# Patient Record
Sex: Female | Born: 1945 | Race: White | Hispanic: No | Smoking: Former smoker
Health system: Southern US, Community
[De-identification: ages and names within clinical notes are randomized; demographics above are authoritative.]

## PROBLEM LIST (undated history)

## (undated) DIAGNOSIS — G610 Guillain-Barre syndrome: Secondary | ICD-10-CM

## (undated) DIAGNOSIS — C4492 Squamous cell carcinoma of skin, unspecified: Secondary | ICD-10-CM

## (undated) DIAGNOSIS — F419 Anxiety disorder, unspecified: Secondary | ICD-10-CM

## (undated) DIAGNOSIS — F329 Major depressive disorder, single episode, unspecified: Secondary | ICD-10-CM

## (undated) DIAGNOSIS — F32A Depression, unspecified: Secondary | ICD-10-CM

## (undated) DIAGNOSIS — M810 Age-related osteoporosis without current pathological fracture: Secondary | ICD-10-CM

## (undated) HISTORY — DX: Depression, unspecified: F32.A

## (undated) HISTORY — DX: Anxiety disorder, unspecified: F41.9

## (undated) HISTORY — DX: Guillain-Barre syndrome: G61.0

## (undated) HISTORY — DX: Age-related osteoporosis without current pathological fracture: M81.0

## (undated) HISTORY — DX: Major depressive disorder, single episode, unspecified: F32.9

## (undated) HISTORY — DX: Squamous cell carcinoma of skin, unspecified: C44.92

---
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
## 1978-06-14 HISTORY — PX: BREAST SURGERY: SHX581

## 1978-06-14 HISTORY — PX: BREAST EXCISIONAL BIOPSY: SUR124

## 1987-06-15 HISTORY — PX: ABDOMINAL HYSTERECTOMY: SHX81

## 2003-06-15 DIAGNOSIS — G610 Guillain-Barre syndrome: Secondary | ICD-10-CM

## 2003-06-15 HISTORY — DX: Guillain-Barre syndrome: G61.0

## 2004-03-14 ENCOUNTER — Encounter: Payer: Self-pay | Admitting: Neurology

## 2004-04-14 ENCOUNTER — Encounter: Payer: Self-pay | Admitting: Neurology

## 2004-05-14 ENCOUNTER — Encounter: Payer: Self-pay | Admitting: Neurology

## 2004-06-14 ENCOUNTER — Encounter: Payer: Self-pay | Admitting: Neurology

## 2005-09-06 ENCOUNTER — Inpatient Hospital Stay (HOSPITAL_COMMUNITY): Admission: EM | Admit: 2005-09-06 | Discharge: 2005-09-10 | Payer: Self-pay | Admitting: Psychiatry

## 2005-09-07 ENCOUNTER — Ambulatory Visit: Payer: Self-pay | Admitting: Psychiatry

## 2008-07-12 ENCOUNTER — Emergency Department: Payer: Self-pay | Admitting: Emergency Medicine

## 2008-07-22 ENCOUNTER — Encounter: Payer: Self-pay | Admitting: General Practice

## 2008-07-23 ENCOUNTER — Encounter: Payer: Self-pay | Admitting: Family Medicine

## 2008-07-29 ENCOUNTER — Ambulatory Visit: Payer: Self-pay | Admitting: Family Medicine

## 2008-07-29 DIAGNOSIS — F32A Depression, unspecified: Secondary | ICD-10-CM | POA: Insufficient documentation

## 2008-07-29 DIAGNOSIS — F331 Major depressive disorder, recurrent, moderate: Secondary | ICD-10-CM | POA: Insufficient documentation

## 2008-07-29 DIAGNOSIS — R5383 Other fatigue: Secondary | ICD-10-CM | POA: Insufficient documentation

## 2008-07-29 DIAGNOSIS — R634 Abnormal weight loss: Secondary | ICD-10-CM | POA: Insufficient documentation

## 2008-07-29 DIAGNOSIS — F329 Major depressive disorder, single episode, unspecified: Secondary | ICD-10-CM

## 2008-08-01 ENCOUNTER — Ambulatory Visit: Payer: Self-pay | Admitting: Family Medicine

## 2008-08-01 ENCOUNTER — Encounter: Payer: Self-pay | Admitting: Family Medicine

## 2008-08-08 ENCOUNTER — Ambulatory Visit: Payer: Self-pay | Admitting: Family Medicine

## 2008-08-12 ENCOUNTER — Encounter (INDEPENDENT_AMBULATORY_CARE_PROVIDER_SITE_OTHER): Payer: Self-pay | Admitting: *Deleted

## 2008-08-15 ENCOUNTER — Encounter (INDEPENDENT_AMBULATORY_CARE_PROVIDER_SITE_OTHER): Payer: Self-pay | Admitting: *Deleted

## 2008-08-15 ENCOUNTER — Telehealth: Payer: Self-pay | Admitting: Family Medicine

## 2008-08-26 ENCOUNTER — Ambulatory Visit: Payer: Self-pay | Admitting: Family Medicine

## 2008-08-26 DIAGNOSIS — D649 Anemia, unspecified: Secondary | ICD-10-CM | POA: Insufficient documentation

## 2008-08-26 DIAGNOSIS — E559 Vitamin D deficiency, unspecified: Secondary | ICD-10-CM | POA: Insufficient documentation

## 2008-09-03 ENCOUNTER — Ambulatory Visit: Payer: Self-pay | Admitting: Family Medicine

## 2008-09-03 ENCOUNTER — Encounter: Payer: Self-pay | Admitting: Family Medicine

## 2009-06-14 DIAGNOSIS — C4492 Squamous cell carcinoma of skin, unspecified: Secondary | ICD-10-CM

## 2009-06-14 HISTORY — DX: Squamous cell carcinoma of skin, unspecified: C44.92

## 2010-07-12 LAB — CONVERTED CEMR LAB: Fecal Occult Bld: NEGATIVE

## 2010-10-30 NOTE — Discharge Summary (Signed)
[name], [name]                ACCOUNT NO.:  [id number]   MEDICAL RECORD NO.:  [id number]          PATIENT TYPE:  IPS   LOCATION:  0307                          FACILITY:  [facility]   PHYSICIAN:  [name], M.D. DATE OF BIRTH:  [birth year]   DATE OF ADMISSION:  09/06/2005  DATE OF DISCHARGE:  09/10/2005                                 DISCHARGE SUMMARY   IDENTIFYING DATA:  This is a 65 year old widowed Caucasian female  voluntarily admitted with a history of depression, fleeting suicidal passive  thoughts of how to hurt herself, which meant she just would die and felt  that she was a coward since she did not have the guts to hurt herself.  Hospitalized last year.  Got tearful, isolative, not sleeping well, wakes up  at 4 a.m.  No psychotic symptoms.   PAST PSYCHIATRIC HISTORY:  First [facility] admission.  Sees a  therapist.  In the past had been on Celexa.   PRIMARY CARE PHYSICIAN:  Dr. Achille Rich in [place].   MEDICATIONS:  Xanax 0.25 mg b.i.d. one year ago.   ALLERGIES:  KEFLEX (causing hives).   PHYSICAL EXAMINATION:  Physical and neurologic exam within normal limits.   MENTAL STATUS EXAM:  Fully alert, cooperative.  Fair eye contact.  Casually  dressed.  Speech soft-spoken.  Mood depressed.  Affect restricted.  Thought  processes goal directed.  No evidence of psychosis.  Cognitively intact.  Judgment and insight were impaired.   ADMISSION DIAGNOSES:  AXIS I:  Major depressive disorder, single episode,  severe.  AXIS II:  Deferred.  AXIS III:  History of Guillain-Barre syndrome.  AXIS IV:  Moderate (problems with multiple psychosocial issues).  AXIS V:  30/55-60.   HOSPITAL COURSE:  The patient was admitted and ordered routine p.r.n.  medications and underwent further monitoring.  Was encouraged to participate  in individual, group and milieu therapy.  The patient was stabilized.  Risk/benefit ratio and alternative treatments were discussed  regarding  medications and patient was agreeable with starting with Lamictal.  Aware of  risk of rash and other precautions.  The patient reported tolerating  medications well.  Family was ordered.  Aftercare planning in place.  Support system mobilized and patient reported a positive response from  crisis intervention, supportive therapy, stabilization.   CONDITION ON DISCHARGE:  At the time of discharge, mood was euthymic.  Affect brighter.  Judgment and insight improved.  Healthier coping skills.  Reported motivation to be compliant with the aftercare plan.  The patient  was discharged after medication education again was reviewed at the time of  discharge.   DISCHARGE MEDICATIONS:  1.  Ambien 12.5 mg at 9 p.m.  2.  Lamictal 25 mg at 9 p.m. until September 19, 2005 and then 50 mg at 9 p.m.  3.  Xanax 0.5 mg, 1/2-1 every eight hours p.r.n. anxiety.   FOLLOW UP:  The patient was to follow up with __________ Beverely Pace at [facility] on September 14, 2005 and on September 16, 2005 and Dr. Donell Beers at [facility] on September 15, 2005.   DISCHARGE DIAGNOSES:  AXIS I:  Major depressive disorder, single episode,  severe.  AXIS II:  Deferred.  AXIS III:  History of Guillain-Barre syndrome.  AXIS IV:  Moderate (problems with multiple psychosocial issues).  AXIS V:  GAF on discharge 60.      [name], M.D.  Electronically Signed     JEM/MEDQ  D:  09/16/2005  T:  09/18/2005  Job:  [id number]

## 2010-10-30 NOTE — H&P (Signed)
[name], [name]                ACCOUNT NO.:  [id number]   MEDICAL RECORD NO.:  [id number]          PATIENT TYPE:  IPS   LOCATION:  0307                          FACILITY:  [facility]   PHYSICIAN:  [name], M.D. DATE OF BIRTH:  1945-12-22   DATE OF ADMISSION:  09/06/2005  DATE OF DISCHARGE:                         PSYCHIATRIC ADMISSION ASSESSMENT   IDENTIFYING INFORMATION:  This is a 65 year old widowed white female  voluntarily admitted on September 06, 2005.   HISTORY OF PRESENT ILLNESS:  The patient presents with a history of  depressive symptoms, fleeting suicidal thoughts, having thoughts hoping to  get some sort of terminal illness.  The patient states that she is a  coward and would not actively harm herself.  Lost her husband about a year  ago.  Has been tearful and isolating.  She has not been sleeping well.  She  wakes up about 4:00 in the morning.  Denies any psychotic symptoms and sees  no reason to live.   PAST PSYCHIATRIC HISTORY:  First admission to [facility].  She  is seeing a therapist.  In the past has been on Celexa.   SOCIAL HISTORY:  This is a 65 year old widowed white female, married for 32  years.  She has an adult daughter.  She lives alone.  She works as a Audiological scientist at [employer].   FAMILY HISTORY:  Denies.   ALCOHOL/DRUG HISTORY:  Nonsmoker.  Denies any alcohol or drug use.   PRIMARY CARE PHYSICIAN:  Dr. Orson Aloe in [place].   MEDICAL PROBLEMS:  History of Guillain-Barre syndrome two years ago which  she had residual weakness.   MEDICATIONS:  Has been on Xanax 0.25 mg b.i.d. for one year.   ALLERGIES:  KEFLEX (she reports problems with hives).   REVIEW OF SYSTEMS:  She denies any fever, chills, no chest pain, no  shortness of breath, no nausea, vomiting or diarrhea.  No seizures.  Positive for insomnia.  No weight loss.  Some lower extremity weakness.  Positive for depression.  No substance abuse.   PHYSICAL EXAMINATION:  VITAL SIGNS:  Temperature 98.4, heart rate 67,  respirations 14, blood pressure 147/88, height 5 feet 5 inches tall, weight  178 pounds.  GENERAL:  This is a well-nourished, middle-aged female.  NECK:  Negative lymphadenopathy.  Trachea is midline.  CHEST:  Clear.  BREAST:  Exam is deferred.  HEART:  Regular rate and rhythm.  ABDOMEN:  Soft, nontender.  GU:  Exam deferred.  EXTREMITIES:  The patient moves all extremities.  No clubbing, no  deformities.  SKIN:  Warm and dry without rashes or lacerations noted.  NEUROLOGIC:  Findings are intact.  Nonfocal.   LABORATORY DATA:  CBC is within normal limits.  Urinalysis shows moderate  bacteria.  Glucose is 114.  TSH is 3.10.   DIAGNOSES:  AXIS I:  Major depressive disorder, single, severe.  AXIS II:  Deferred.  AXIS III:  History of Guillain-Barre.  AXIS IV:  Other psychosocial problems related to grief.  AXIS V:  Current 30.   PLAN:  To stabilize mood  and thinking.  Will initiate Lamictal.  Risks and  benefits were discussed.  The patient is agreeable to beginning medication.  The patient is to attend all groups.  The patient is to continue with her  therapy.  The patient may receive some benefit from hospice therapy.   TENTATIVE LENGTH OF STAY:  Four to six days.      Landry Corporal, N.P.      [name], M.D.  Electronically Signed    JO/MEDQ  D:  09/10/2005  T:  09/11/2005  Job:  [id number]

## 2010-12-04 ENCOUNTER — Other Ambulatory Visit: Payer: Self-pay | Admitting: Physician Assistant

## 2011-02-26 ENCOUNTER — Other Ambulatory Visit: Payer: Self-pay | Admitting: Specialist

## 2011-04-14 ENCOUNTER — Ambulatory Visit: Payer: Self-pay | Admitting: Urology

## 2011-05-09 LAB — HM MAMMOGRAPHY: HM Mammogram: NORMAL

## 2011-05-12 ENCOUNTER — Ambulatory Visit: Payer: Self-pay | Admitting: Specialist

## 2012-08-09 ENCOUNTER — Ambulatory Visit: Payer: Self-pay | Admitting: Internal Medicine

## 2012-11-22 ENCOUNTER — Ambulatory Visit: Payer: Self-pay | Admitting: Internal Medicine

## 2012-12-24 ENCOUNTER — Emergency Department: Payer: Self-pay | Admitting: Unknown Physician Specialty

## 2012-12-24 ENCOUNTER — Ambulatory Visit: Payer: Self-pay | Admitting: Orthopedic Surgery

## 2012-12-24 LAB — COMPREHENSIVE METABOLIC PANEL
Albumin: 3.7 g/dL (ref 3.4–5.0)
BUN: 14 mg/dL (ref 7–18)
Calcium, Total: 8.7 mg/dL (ref 8.5–10.1)
Chloride: 108 mmol/L — ABNORMAL HIGH (ref 98–107)
EGFR (African American): >60
EGFR (Non-African Amer.): >60
Glucose: 93 mg/dL (ref 65–99)
Osmolality: 283 (ref 275–301)
Potassium: 4 mmol/L (ref 3.5–5.1)
SGOT(AST): 17 U/L (ref 15–37)
SGPT (ALT): 18 U/L (ref 12–78)
Sodium: 142 mmol/L (ref 136–145)

## 2012-12-24 LAB — CBC
HCT: 36 % (ref 35.0–47.0)
MCHC: 34.8 g/dL (ref 32.0–36.0)
MCV: 90 fL (ref 80–100)
Platelet: 228 10*3/uL (ref 150–440)
RDW: 13.1 % (ref 11.5–14.5)
WBC: 4.1 10*3/uL (ref 3.6–11.0)

## 2012-12-24 LAB — APTT: Activated PTT: 26.2 secs (ref 23.6–35.9)

## 2012-12-24 LAB — PROTIME-INR: INR: 0.9

## 2013-01-23 ENCOUNTER — Encounter: Payer: Self-pay | Admitting: Internal Medicine

## 2013-01-23 ENCOUNTER — Ambulatory Visit (INDEPENDENT_AMBULATORY_CARE_PROVIDER_SITE_OTHER): Payer: 59 | Admitting: Internal Medicine

## 2013-01-23 VITALS — BP 130/64 | HR 68 | Temp 98.0°F | Resp 14 | Ht 66.0 in | Wt 170.8 lb

## 2013-01-23 DIAGNOSIS — F330 Major depressive disorder, recurrent, mild: Secondary | ICD-10-CM

## 2013-01-23 DIAGNOSIS — S5290XA Unspecified fracture of unspecified forearm, initial encounter for closed fracture: Secondary | ICD-10-CM

## 2013-01-23 DIAGNOSIS — R29898 Other symptoms and signs involving the musculoskeletal system: Secondary | ICD-10-CM

## 2013-01-23 DIAGNOSIS — E785 Hyperlipidemia, unspecified: Secondary | ICD-10-CM

## 2013-01-23 DIAGNOSIS — Z8669 Personal history of other diseases of the nervous system and sense organs: Secondary | ICD-10-CM

## 2013-01-23 DIAGNOSIS — M810 Age-related osteoporosis without current pathological fracture: Secondary | ICD-10-CM

## 2013-01-23 DIAGNOSIS — E559 Vitamin D deficiency, unspecified: Secondary | ICD-10-CM

## 2013-01-23 DIAGNOSIS — S42309S Unspecified fracture of shaft of humerus, unspecified arm, sequela: Secondary | ICD-10-CM

## 2013-01-23 DIAGNOSIS — S5291XS Unspecified fracture of right forearm, sequela: Secondary | ICD-10-CM

## 2013-01-23 DIAGNOSIS — R5381 Other malaise: Secondary | ICD-10-CM

## 2013-01-23 HISTORY — DX: Unspecified fracture of unspecified forearm, initial encounter for closed fracture: S52.90XA

## 2013-01-23 LAB — CBC WITH DIFFERENTIAL/PLATELET
Basophils Absolute: 0 10*3/uL (ref 0.0–0.1)
Basophils Relative: 0.5 % (ref 0.0–3.0)
Eosinophils Absolute: 0.8 10*3/uL — ABNORMAL HIGH (ref 0.0–0.7)
MCHC: 30.4 g/dL (ref 30.0–36.0)
MCV: 94.3 fl (ref 78.0–100.0)
Monocytes Absolute: 0.4 10*3/uL (ref 0.1–1.0)
Neutrophils Relative %: 41.4 % — ABNORMAL LOW (ref 43.0–77.0)
Platelets: 198 10*3/uL (ref 150.0–400.0)
RBC: 4.49 Mil/uL (ref 3.87–5.11)
RDW: 13 % (ref 11.5–14.6)

## 2013-01-23 LAB — COMPREHENSIVE METABOLIC PANEL
ALT: 11 U/L (ref 0–35)
AST: 15 U/L (ref 0–37)
Albumin: 4.3 g/dL (ref 3.5–5.2)
Alkaline Phosphatase: 68 U/L (ref 39–117)
Chloride: 105 mEq/L (ref 96–112)
Potassium: 4.4 mEq/L (ref 3.5–5.1)
Sodium: 141 mEq/L (ref 135–145)
Total Protein: 7.3 g/dL (ref 6.0–8.3)

## 2013-01-23 LAB — LIPID PANEL
HDL: 61.1 mg/dL (ref >39.00)
Total CHOL/HDL Ratio: 3
VLDL: 13.4 mg/dL (ref 0.0–40.0)

## 2013-01-23 NOTE — Progress Notes (Signed)
Patient ID: Holly [name], female   DOB: 1946-02-10, 67 y.o.   MRN: [id number]   Patient Active Problem List   Diagnosis Date Noted  . Right leg weakness 01/23/2013  . History of Guillain-Barre syndrome 01/23/2013  . Radial fracture 01/23/2013  . 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 UNSPECIFIED VITAMIN D DEFICIENCY 08/26/2008  . UNSPECIFIED ANEMIA 08/26/2008  . Recurrent depressive disorder, current episode mild 07/29/2008  . FATIGUE 07/29/2008  . WEIGHT LOSS 07/29/2008    Subjective:  CC:   Chief Complaint  Patient presents with  . Establish Care    Previous patient from dr.blockard office: Concern of hand tremors.    HPI:   Holly [name] is a 67 y.o. female who presents as a new patient to establish primary care with the chief complaint of  Right arm pain.  She suffered a distal radial fracture on July 13th which was reduced in the ER and casted by Patsy Lager. J Occurred when she fell in the yard while trying to remove a broken tree branch. Currently using percocet for pain control.  Using Kefir milk and prn sennakot to prevent bowel constipat. Follows up with Ortho Aug 29th   Right leg weakness.  She has a History of severe episode of Guillain Barrewhich occurred in  11-12-03  And required transfer [facility] for progression to bulbar muscles. Did not require intubation but was in I and MICU and  For several weeks.  Is followed by 6 since her is requiring physical therapy. Despite the passage of time she continues to have right leg weakness which limits her exercise capacity due to fear of falling. She does not exercise regularly or even walk other than at work  .  She has been told by Dr. blocker to avoid certain vaccines including flu vaccine. Is not clear whether she has been told to avoid shingles vaccine  Depression. Patient's depression started when her husband died in 11/11/2004. Symptoms include weight loss anhedonia and isolation. She is under the care of Dr. Brendia Sacks and is currently  taking Abilify and Wellbutrin This cocktail was the result of multiple trials and manages her symptoms of persistent anxiety as well.  Additional stressors include current living situation. Her grown dtr is disabled due to psych issues and back pain /injury ,  has bipolar disorder and has been living with her since her partner died 8 months ago. She is wanting to retire in a year but does not think she will be able to afford it.   Low vit d .  currently taking 400 IUS currently but years ago Saw a [place] MD and treated for low Dr. Leavy Cella has screened her for osteoporosis and has recommended use of alendronate for treatment of right hip osteoporosis but she has deferred due to concern about side effects.   Health maintenance. She is Up to date on mammograms. She underwent TAH/BSO at 42 due multiple cysts on ovaries and pain . Took a HRT only for 3 years bc of side effects. She does not want to have a colonoscopy.willing to do annual IFOB. Her maternal uncle had colon CA.  Sees Dr Achilles Dunk for recurrent UTIS,  complicated by occasional episodes of mild urge/stress incontinence .  Cope prescribed low dose abx for 10-14 days when she develops spasm and frequency.  None in 5 months .  Saw him recently.     Past Medical History  Diagnosis Date  . Anxiety   . Cancer 11-11-2009    Squamus cell skin  .  Depression   . Neuromuscular disorder     gullian burrett syndrome  . Osteoporosis     Past Surgical History  Procedure Laterality Date  . Breast surgery N/A 1980    biopsy  . Appendectomy  1960  . Abdominal hysterectomy  1989    Family History  Problem Relation Age of Onset  . Cancer Mother   . Mental illness Sister   . Depression Brother   . Mental illness Brother     History   Social History  . Marital Status: Widowed    Spouse Name: N/A    Number of Children: N/A  . Years of Education: N/A   Occupational History  . Not on file.   Social History Main Topics  . Smoking status: Former  Smoker -- 0.20 packs/day    Types: Cigarettes    Quit date: 06/14/1977  . Smokeless tobacco: Never Used  . Alcohol Use: Yes     Comment: social   . Drug Use: No  . Sexually Active: No   Other Topics Concern  . Not on file   Social History Narrative  . No narrative on file   Allergies  Allergen Reactions  . Cephalexin     Review of Systems:   Patient denies headache, fevers, malaise, unintentional weight loss, skin rash, eye pain, sinus congestion and sinus pain, sore throat, dysphagia,  hemoptysis , cough, dyspnea, wheezing, chest pain, palpitations, orthopnea, edema, abdominal pain, nausea, melena, diarrhea, constipation, flank pain, dysuria, hematuria, urinary  Frequency, nocturia, numbness, tingling, seizures,  Focal weakness, Loss of consciousness,  Tremor, insomnia, depression, anxiety, and suicidal ideation.     Objective:  BP 130/64  Pulse 68  Temp(Src) 98 F (36.7 C) (Oral)  Resp 14  Ht 5\' 6"  (1.676 m)  Wt 170 lb 12 oz (77.452 kg)  BMI 27.57 kg/m2  SpO2 99%  General appearance: alert, cooperative and appears stated age Ears: normal TM's and external ear canals both ears Throat: lips, mucosa, and tongue normal; teeth and gums normal Neck: no adenopathy, no carotid bruit, supple, symmetrical, trachea midline and thyroid not enlarged, symmetric, no tenderness/mass/nodules Back: symmetric, no curvature. ROM normal. No CVA tenderness. Lungs: clear to auscultation bilaterally Heart: regular rate and rhythm, S1, S2 normal, no murmur, click, rub or gallop Abdomen: soft, non-tender; bowel sounds normal; no masses,  no organomegaly Pulses: 2+ and symmetric Skin: Skin color, texture, turgor normal. No rashes or lesions Lymph nodes: Cervical, supraclavicular, and axillary nodes normal.  Assessment and Plan:  Right leg weakness At try to encourage her to engage in physical therapy to strengthen her right leg. She has a fear of falling which inhibits her from doing any  form of regular exercise  Recurrent depressive disorder, current episode mild Managed with Wellbutrin , Abilify and clorazepate by Dr. Donell Beers in [place].Marland Kitchen She has ongoing psychotherapy with Ludwig Clarks  History of Guillain-Barre syndrome Severe requiring MICU admission at [facility] for impending respiratory failure. Patient was fortunate in that although her bulbar muscles are affected she did not require intubation. However she continues to have right leg weakness which limits her ability to maintain conditioning due to fear of falling.   Radial fracture Patient has a history of osteoporosis and requires vitamin D and repeat DEXA scan if possible. She is having the cast removed on August 29.   Postmenopausal osteoporosis Secondary to surgical menopause at age 89 followed by only 3 years of oral hormone therapy. Patient has deferred use of alendronate which I  agree may be problematic given her history of Guillain-Barr affecting the bulbar muscles. I think Evista would be a better choice. We will repeat a DEXA scan after her cast is removed and decided to   Updated Medication List Outpatient Encounter Prescriptions as of 01/23/2013  Medication Sig Dispense Refill  . ARIPiprazole (ABILIFY) 5 MG tablet Take 2.5 mg by mouth daily.      Marland Kitchen buPROPion (WELLBUTRIN XL) 300 MG 24 hr tablet Take 300 mg by mouth daily.      . Calcium Carbonate-Vitamin D (CALCIUM 600+D HIGH POTENCY PO) Take 1 tablet by mouth daily.      . clorazepate (TRANXENE) 7.5 MG tablet Take 7.5 mg by mouth 2 (two) times daily as needed for anxiety (!5 mg at bed time).      Marland Kitchen oxyCODONE-acetaminophen (PERCOCET/ROXICET) 5-325 MG per tablet Take 1 tablet by mouth.      . temazepam (RESTORIL) 15 MG capsule Take 30 mg by mouth at bedtime as needed for sleep.       No facility-administered encounter medications on file as of 01/23/2013.     Orders Placed This Encounter  Procedures  . DG Bone Density  . Vitamin D 25 hydroxy  .  Comprehensive metabolic panel  . CBC with Differential  . TSH  . Lipid panel    No Follow-up on file.

## 2013-01-23 NOTE — Patient Instructions (Signed)
We are checking your vitamin d level today to make sure you are getting enough  I am referring you for a DEXA scan once your cast is removed.  Return  For a full physical when available

## 2013-01-23 NOTE — Assessment & Plan Note (Signed)
Secondary to surgical menopause at age 67 followed by only 3 years of oral hormone therapy. Patient has deferred use of alendronate which I agree may be problematic given her history of Guillain-Barr affecting the bulbar muscles. I think Evista would be a better choice. We will repeat a DEXA scan after her cast is removed and decided to

## 2013-01-23 NOTE — Assessment & Plan Note (Signed)
Patient has a history of osteoporosis and requires vitamin D and repeat DEXA scan if possible. She is having the cast removed on August 29.

## 2013-01-23 NOTE — Assessment & Plan Note (Addendum)
Managed with Wellbutrin , Abilify and clorazepate by Dr. Donell Beers in [place].Marland Kitchen She has ongoing psychotherapy with Ludwig Clarks

## 2013-01-23 NOTE — Assessment & Plan Note (Signed)
At try to encourage her to engage in physical therapy to strengthen her right leg. She has a fear of falling which inhibits her from doing any form of regular exercise

## 2013-01-23 NOTE — Assessment & Plan Note (Signed)
Severe requiring MICU admission at [facility] for impending respiratory failure. Patient was fortunate in that although her bulbar muscles are affected she did not require intubation. However she continues to have right leg weakness which limits her ability to maintain conditioning due to fear of falling.

## 2013-01-24 LAB — VITAMIN D 25 HYDROXY (VIT D DEFICIENCY, FRACTURES): Vit D, 25-Hydroxy: 27 ng/mL — ABNORMAL LOW (ref 30–89)

## 2013-02-15 ENCOUNTER — Encounter: Payer: Self-pay | Admitting: Specialist

## 2013-02-22 ENCOUNTER — Telehealth: Payer: Self-pay | Admitting: Internal Medicine

## 2013-02-22 NOTE — Telephone Encounter (Signed)
See Bone density order from 01/23/13 please advise

## 2013-02-22 NOTE — Telephone Encounter (Signed)
Pt states she was to have bone density set up and she has not heard back.  States she has physical  10/3 and wants to have results.  Also will have an insurance change later in October so would like to complete this before the change takes place.  Please advise.

## 2013-02-23 NOTE — Telephone Encounter (Signed)
Sorry, Bone Density are a order that does not show on scheduleable orders. I will take care of this today.

## 2013-03-14 ENCOUNTER — Encounter: Payer: Self-pay | Admitting: Specialist

## 2013-03-16 ENCOUNTER — Encounter: Payer: Self-pay | Admitting: Internal Medicine

## 2013-03-16 ENCOUNTER — Ambulatory Visit (INDEPENDENT_AMBULATORY_CARE_PROVIDER_SITE_OTHER): Payer: 59 | Admitting: Internal Medicine

## 2013-03-16 VITALS — BP 118/60 | HR 75 | Temp 98.1°F | Resp 12 | Ht 65.5 in | Wt 166.8 lb

## 2013-03-16 DIAGNOSIS — F331 Major depressive disorder, recurrent, moderate: Secondary | ICD-10-CM

## 2013-03-16 DIAGNOSIS — Z1211 Encounter for screening for malignant neoplasm of colon: Secondary | ICD-10-CM | POA: Insufficient documentation

## 2013-03-16 DIAGNOSIS — Z1239 Encounter for other screening for malignant neoplasm of breast: Secondary | ICD-10-CM

## 2013-03-16 DIAGNOSIS — D721 Eosinophilia, unspecified: Secondary | ICD-10-CM

## 2013-03-16 DIAGNOSIS — Z0000 Encounter for general adult medical examination without abnormal findings: Secondary | ICD-10-CM | POA: Insufficient documentation

## 2013-03-16 LAB — CBC WITH DIFFERENTIAL/PLATELET
Basophils Absolute: 0.1 K/uL (ref 0.0–0.1)
Basophils Relative: 1 % (ref 0.0–3.0)
Eosinophils Absolute: 0.1 K/uL (ref 0.0–0.7)
Eosinophils Relative: 2 % (ref 0.0–5.0)
HCT: 36.4 % (ref 36.0–46.0)
Hemoglobin: 12.3 g/dL (ref 12.0–15.0)
Lymphocytes Relative: 38.4 % (ref 12.0–46.0)
Lymphs Abs: 2 K/uL (ref 0.7–4.0)
MCHC: 33.8 g/dL (ref 30.0–36.0)
MCV: 91.3 fl (ref 78.0–100.0)
Monocytes Absolute: 0.4 K/uL (ref 0.1–1.0)
Monocytes Relative: 7.1 % (ref 3.0–12.0)
Neutro Abs: 2.6 K/uL (ref 1.4–7.7)
Neutrophils Relative %: 51.5 % (ref 43.0–77.0)
Platelets: 276 K/uL (ref 150.0–400.0)
RBC: 3.98 Mil/uL (ref 3.87–5.11)
RDW: 13.5 % (ref 11.5–14.6)
WBC: 5.1 K/uL (ref 4.5–10.5)

## 2013-03-16 NOTE — Progress Notes (Signed)
Patient ID: Dasiah Hooley, female   DOB: Apr 21, 1946, 67 y.o.   MRN: [id number]  Annual ex  Wrist fracture i n July.  Lost balance while working in the yard,  Her balance  and weakness has been off sicne hrer GB am. TAH/BSO remotely,  History  Of recurrent UTis treated with ab x x 3 months   History of squamous cel CA on forehead ,  [facility] in [place].  lost to follow up over one year   Severe depression /anxiety.   Sees therapist Michaela Corner  And MD Plovsky.  Very depressed .  Admitted that she took additional doses of clorazepate, 3 at a time,  Total fo 15 exra pills over a 24 hr period to help calm down.  Triggered by daughter's volatile behaviors scondary to bipolar disorder.  This occurred out 2 or 3 months ago Subjective:     Xylina Rhoads is a 67 y.o. female and is here for a comprehensive physical exam. The patient reports worsening depression. She has been living with her grown daughter who has a history of bipolar disorder. Her daughter recently lost her life partner and has been emotionally unstable. The stress of living there has been severe. She is quite lethargic today and did not readily discuss her symptoms until I asked her why she  was acting so lethargic.   History   Social History  . Marital Status: Widowed    Spouse Name: N/A    Number of Children: N/A  . Years of Education: N/A   Occupational History  . Not on file.   Social History Main Topics  . Smoking status: Former Smoker -- 0.20 packs/day    Types: Cigarettes    Quit date: 06/14/1977  . Smokeless tobacco: Never Used  . Alcohol Use: Yes     Comment: social   . Drug Use: No  . Sexual Activity: No   Other Topics Concern  . Not on file   Social History Narrative  . No narrative on file   Health Maintenance  Topic Date Due  . Mammogram  01/25/1998  . Colonoscopy  01/25/1998  . Zostavax  01/26/2008  . Tetanus/tdap  06/14/2016    The following portions of the patient's history were reviewed  and updated as appropriate: allergies, current medications, past family history, past medical history, past social history, past surgical history and problem list.  Review of Systems A comprehensive review of systems was negative.   Objective:   BP 118/60  Pulse 75  Temp(Src) 98.1 F (36.7 C) (Oral)  Resp 12  Ht 5' 5.5" (1.664 m)  Wt 166 lb 12 oz (75.637 kg)  BMI 27.32 kg/m2  SpO2 98%  General appearance: alert, cooperative and appears stated age Head: Normocephalic, without obvious abnormality, atraumatic Eyes: conjunctivae/corneas clear. PERRL, EOM's intact. Fundi benign. Ears: normal TM's and external ear canals both ears Nose: Nares normal. Septum midline. Mucosa normal. No drainage or sinus tenderness. Throat: lips, mucosa, and tongue normal; teeth and gums normal Neck: no adenopathy, no carotid bruit, no JVD, supple, symmetrical, trachea midline and thyroid not enlarged, symmetric, no tenderness/mass/nodules Lungs: clear to auscultation bilaterally Breasts: normal appearance, no masses or tenderness Heart: regular rate and rhythm, S1, S2 normal, no murmur, click, rub or gallop Abdomen: soft, non-tender; bowel sounds normal; no masses,  no organomegaly Extremities: extremities normal, atraumatic, no cyanosis or edema Pulses: 2+ and symmetric Skin: Skin color, texture, turgor normal. No rashes or lesions Neurologic: Alert and oriented X 3,  normal strength and tone. Normal symmetric reflexes. Normal coordination and gait.  Psych: Affect flat response time increased. She is answering appropriately makes good eye contact but is distraught at times    Assessment:   Recurrent depressive disorder, current episode moderate Spent an additional 20 minutes discussing her current symptoms which have been uncontrolled and aggravated by the stresses of living with her bipolar daughter. She is distraught today and lethargic. I have tried to reach Dr. Donell Beers to arrange earlier apartment  for her but was able to only to leave a message on his business line. She was able to contract for safety and stated that if she needed admission she would go to [facility] behavior health unit formation.  Encounter for screening colonoscopy She was given IFOB for colon cancer screening as she is not interested in colonoscopy.  Routine general medical examination at a health care facility Annual comprehensive exam was done including breast exam only. All screenings have been addressed .   A total of 60 minutes was spent with patient more than half of which was spent in counseling, reviewing records from other prviders and coordination of care.  Updated Medication List Outpatient Encounter Prescriptions as of 03/16/2013  Medication Sig Dispense Refill  . ARIPiprazole (ABILIFY) 5 MG tablet Take 2.5 mg by mouth daily.      Marland Kitchen buPROPion (WELLBUTRIN XL) 300 MG 24 hr tablet Take 300 mg by mouth daily.      . Calcium Carbonate-Vitamin D (CALCIUM 600+D HIGH POTENCY PO) Take 1 tablet by mouth daily.      . clorazepate (TRANXENE) 7.5 MG tablet Take 7.5 mg by mouth 2 (two) times daily as needed for anxiety (!5 mg at bed time).      . temazepam (RESTORIL) 15 MG capsule Take 30 mg by mouth at bedtime as needed for sleep.      . [DISCONTINUED] oxyCODONE-acetaminophen (PERCOCET/ROXICET) 5-325 MG per tablet Take 1 tablet by mouth.       No facility-administered encounter medications on file as of 03/16/2013.

## 2013-03-16 NOTE — Patient Instructions (Addendum)
You need 1800 mg of calcium daily,  Try to get 1200 mg through your diet, the rest through a calcium supplement  I am setting you up for a mammogram  I left a message with Dr Donell Beers to call you on your cell phone to see if you could be seen next week

## 2013-03-18 ENCOUNTER — Encounter: Payer: Self-pay | Admitting: Internal Medicine

## 2013-03-18 NOTE — Assessment & Plan Note (Signed)
She was given IFOB for colon cancer screening as she is not interested in colonoscopy.

## 2013-03-18 NOTE — Assessment & Plan Note (Signed)
Annual comprehensive exam was done including breast exam only. All screenings have been addressed .

## 2013-03-18 NOTE — Assessment & Plan Note (Signed)
Spent an additional 20 minutes discussing her current symptoms which have been uncontrolled and aggravated by the stresses of living with her bipolar daughter. She is distraught today and lethargic. I have tried to reach Dr. Donell Beers to arrange earlier apartment for her but was able to only to leave a message on his business line. She was able to contract for safety and stated that if she needed admission she would go to [facility] health unit formation.

## 2013-03-20 ENCOUNTER — Encounter: Payer: Self-pay | Admitting: *Deleted

## 2013-03-22 ENCOUNTER — Other Ambulatory Visit (INDEPENDENT_AMBULATORY_CARE_PROVIDER_SITE_OTHER): Payer: 59

## 2013-03-22 DIAGNOSIS — Z1211 Encounter for screening for malignant neoplasm of colon: Secondary | ICD-10-CM

## 2013-03-23 LAB — FECAL OCCULT BLOOD, IMMUNOCHEMICAL: Fecal Occult Bld: NEGATIVE

## 2013-03-26 ENCOUNTER — Encounter: Payer: Self-pay | Admitting: *Deleted

## 2013-04-07 LAB — FECAL OCCULT BLOOD, GUAIAC: Fecal Occult Blood: NEGATIVE

## 2013-04-14 ENCOUNTER — Encounter: Payer: Self-pay | Admitting: Specialist

## 2013-04-16 DIAGNOSIS — S52539A Colles' fracture of unspecified radius, initial encounter for closed fracture: Secondary | ICD-10-CM | POA: Diagnosis not present

## 2013-04-17 DIAGNOSIS — M2550 Pain in unspecified joint: Secondary | ICD-10-CM | POA: Diagnosis not present

## 2013-04-17 DIAGNOSIS — IMO0001 Reserved for inherently not codable concepts without codable children: Secondary | ICD-10-CM | POA: Diagnosis not present

## 2013-04-17 DIAGNOSIS — R609 Edema, unspecified: Secondary | ICD-10-CM | POA: Diagnosis not present

## 2013-04-17 DIAGNOSIS — M2560 Stiffness of unspecified joint, not elsewhere classified: Secondary | ICD-10-CM | POA: Diagnosis not present

## 2013-04-17 DIAGNOSIS — M6281 Muscle weakness (generalized): Secondary | ICD-10-CM | POA: Diagnosis not present

## 2013-04-19 DIAGNOSIS — M2550 Pain in unspecified joint: Secondary | ICD-10-CM | POA: Diagnosis not present

## 2013-04-19 DIAGNOSIS — M6281 Muscle weakness (generalized): Secondary | ICD-10-CM | POA: Diagnosis not present

## 2013-04-19 DIAGNOSIS — IMO0001 Reserved for inherently not codable concepts without codable children: Secondary | ICD-10-CM | POA: Diagnosis not present

## 2013-04-19 DIAGNOSIS — R609 Edema, unspecified: Secondary | ICD-10-CM | POA: Diagnosis not present

## 2013-04-19 DIAGNOSIS — M2560 Stiffness of unspecified joint, not elsewhere classified: Secondary | ICD-10-CM | POA: Diagnosis not present

## 2013-04-25 DIAGNOSIS — M2560 Stiffness of unspecified joint, not elsewhere classified: Secondary | ICD-10-CM | POA: Diagnosis not present

## 2013-04-25 DIAGNOSIS — R609 Edema, unspecified: Secondary | ICD-10-CM | POA: Diagnosis not present

## 2013-04-25 DIAGNOSIS — M6281 Muscle weakness (generalized): Secondary | ICD-10-CM | POA: Diagnosis not present

## 2013-04-25 DIAGNOSIS — IMO0001 Reserved for inherently not codable concepts without codable children: Secondary | ICD-10-CM | POA: Diagnosis not present

## 2013-04-25 DIAGNOSIS — M2550 Pain in unspecified joint: Secondary | ICD-10-CM | POA: Diagnosis not present

## 2013-04-26 DIAGNOSIS — M6281 Muscle weakness (generalized): Secondary | ICD-10-CM | POA: Diagnosis not present

## 2013-04-26 DIAGNOSIS — R609 Edema, unspecified: Secondary | ICD-10-CM | POA: Diagnosis not present

## 2013-04-26 DIAGNOSIS — IMO0001 Reserved for inherently not codable concepts without codable children: Secondary | ICD-10-CM | POA: Diagnosis not present

## 2013-04-26 DIAGNOSIS — M2560 Stiffness of unspecified joint, not elsewhere classified: Secondary | ICD-10-CM | POA: Diagnosis not present

## 2013-04-26 DIAGNOSIS — M2550 Pain in unspecified joint: Secondary | ICD-10-CM | POA: Diagnosis not present

## 2013-05-01 DIAGNOSIS — M6281 Muscle weakness (generalized): Secondary | ICD-10-CM | POA: Diagnosis not present

## 2013-05-01 DIAGNOSIS — M2560 Stiffness of unspecified joint, not elsewhere classified: Secondary | ICD-10-CM | POA: Diagnosis not present

## 2013-05-01 DIAGNOSIS — R609 Edema, unspecified: Secondary | ICD-10-CM | POA: Diagnosis not present

## 2013-05-01 DIAGNOSIS — M2550 Pain in unspecified joint: Secondary | ICD-10-CM | POA: Diagnosis not present

## 2013-05-01 DIAGNOSIS — IMO0001 Reserved for inherently not codable concepts without codable children: Secondary | ICD-10-CM | POA: Diagnosis not present

## 2013-05-03 DIAGNOSIS — M2560 Stiffness of unspecified joint, not elsewhere classified: Secondary | ICD-10-CM | POA: Diagnosis not present

## 2013-05-03 DIAGNOSIS — IMO0001 Reserved for inherently not codable concepts without codable children: Secondary | ICD-10-CM | POA: Diagnosis not present

## 2013-05-03 DIAGNOSIS — R609 Edema, unspecified: Secondary | ICD-10-CM | POA: Diagnosis not present

## 2013-05-03 DIAGNOSIS — M6281 Muscle weakness (generalized): Secondary | ICD-10-CM | POA: Diagnosis not present

## 2013-05-03 DIAGNOSIS — M2550 Pain in unspecified joint: Secondary | ICD-10-CM | POA: Diagnosis not present

## 2013-05-07 DIAGNOSIS — F3342 Major depressive disorder, recurrent, in full remission: Secondary | ICD-10-CM | POA: Diagnosis not present

## 2013-05-08 DIAGNOSIS — IMO0001 Reserved for inherently not codable concepts without codable children: Secondary | ICD-10-CM | POA: Diagnosis not present

## 2013-05-08 DIAGNOSIS — M2560 Stiffness of unspecified joint, not elsewhere classified: Secondary | ICD-10-CM | POA: Diagnosis not present

## 2013-05-08 DIAGNOSIS — M6281 Muscle weakness (generalized): Secondary | ICD-10-CM | POA: Diagnosis not present

## 2013-05-08 DIAGNOSIS — R609 Edema, unspecified: Secondary | ICD-10-CM | POA: Diagnosis not present

## 2013-05-08 DIAGNOSIS — M2550 Pain in unspecified joint: Secondary | ICD-10-CM | POA: Diagnosis not present

## 2013-05-14 ENCOUNTER — Encounter: Payer: Self-pay | Admitting: Specialist

## 2013-05-14 DIAGNOSIS — IMO0001 Reserved for inherently not codable concepts without codable children: Secondary | ICD-10-CM | POA: Diagnosis not present

## 2013-05-14 DIAGNOSIS — M2550 Pain in unspecified joint: Secondary | ICD-10-CM | POA: Diagnosis not present

## 2013-05-14 DIAGNOSIS — M2560 Stiffness of unspecified joint, not elsewhere classified: Secondary | ICD-10-CM | POA: Diagnosis not present

## 2013-05-14 DIAGNOSIS — M6281 Muscle weakness (generalized): Secondary | ICD-10-CM | POA: Diagnosis not present

## 2013-05-15 DIAGNOSIS — S52539A Colles' fracture of unspecified radius, initial encounter for closed fracture: Secondary | ICD-10-CM | POA: Diagnosis not present

## 2013-06-14 ENCOUNTER — Encounter: Payer: Self-pay | Admitting: Specialist

## 2013-06-14 DIAGNOSIS — IMO0001 Reserved for inherently not codable concepts without codable children: Secondary | ICD-10-CM | POA: Diagnosis not present

## 2013-06-14 DIAGNOSIS — M2560 Stiffness of unspecified joint, not elsewhere classified: Secondary | ICD-10-CM | POA: Diagnosis not present

## 2013-06-14 DIAGNOSIS — R609 Edema, unspecified: Secondary | ICD-10-CM | POA: Diagnosis not present

## 2013-06-14 DIAGNOSIS — M6281 Muscle weakness (generalized): Secondary | ICD-10-CM | POA: Diagnosis not present

## 2013-06-14 DIAGNOSIS — M2550 Pain in unspecified joint: Secondary | ICD-10-CM | POA: Diagnosis not present

## 2013-06-14 HISTORY — PX: WRIST FRACTURE SURGERY: SHX121

## 2013-06-21 DIAGNOSIS — M2550 Pain in unspecified joint: Secondary | ICD-10-CM | POA: Diagnosis not present

## 2013-06-21 DIAGNOSIS — M6281 Muscle weakness (generalized): Secondary | ICD-10-CM | POA: Diagnosis not present

## 2013-06-21 DIAGNOSIS — M2560 Stiffness of unspecified joint, not elsewhere classified: Secondary | ICD-10-CM | POA: Diagnosis not present

## 2013-06-21 DIAGNOSIS — R609 Edema, unspecified: Secondary | ICD-10-CM | POA: Diagnosis not present

## 2013-06-21 DIAGNOSIS — IMO0001 Reserved for inherently not codable concepts without codable children: Secondary | ICD-10-CM | POA: Diagnosis not present

## 2013-06-25 DIAGNOSIS — F3342 Major depressive disorder, recurrent, in full remission: Secondary | ICD-10-CM | POA: Diagnosis not present

## 2013-07-03 ENCOUNTER — Ambulatory Visit: Payer: Self-pay | Admitting: Internal Medicine

## 2013-07-03 DIAGNOSIS — M810 Age-related osteoporosis without current pathological fracture: Secondary | ICD-10-CM | POA: Diagnosis not present

## 2013-07-03 LAB — HM DEXA SCAN

## 2013-07-08 ENCOUNTER — Telehealth: Payer: Self-pay | Admitting: Internal Medicine

## 2013-07-08 DIAGNOSIS — M810 Age-related osteoporosis without current pathological fracture: Secondary | ICD-10-CM

## 2013-07-08 NOTE — Telephone Encounter (Signed)
Patient's most recent bone density test shows osteoporosis in the left femur. Please have him make an appointment to discuss therapy.

## 2013-07-08 NOTE — Assessment & Plan Note (Signed)
Recent wrist fracture in 2014 secondary to fall. We'll bring patient in to discuss therapy.

## 2013-07-10 NOTE — Telephone Encounter (Signed)
Left message for pt to call back  °

## 2013-07-11 NOTE — Telephone Encounter (Signed)
The patient returning the nurses call.

## 2013-07-12 NOTE — Telephone Encounter (Signed)
Left message, notifying pt of results and requested call back to schedule an appointment.

## 2013-07-16 ENCOUNTER — Encounter: Payer: Self-pay | Admitting: Neurology

## 2013-07-16 ENCOUNTER — Ambulatory Visit (INDEPENDENT_AMBULATORY_CARE_PROVIDER_SITE_OTHER): Payer: Medicare Other | Admitting: Neurology

## 2013-07-16 VITALS — BP 150/80 | HR 80 | Temp 98.0°F | Resp 16 | Ht 66.0 in | Wt 169.0 lb

## 2013-07-16 DIAGNOSIS — F458 Other somatoform disorders: Secondary | ICD-10-CM | POA: Diagnosis not present

## 2013-07-16 DIAGNOSIS — F444 Conversion disorder with motor symptom or deficit: Secondary | ICD-10-CM

## 2013-07-16 NOTE — Progress Notes (Addendum)
NEUROLOGY CONSULTATION NOTE  Holly [name] MRN: [id number] DOB: 1945/09/07  Referring provider: Dr. Casimiro Needle (psychiatrist) Primary care provider: Dr. Derrel Nip  Reason for consult:  Tremor  HISTORY OF PRESENT ILLNESS: Holly [name] is a 68 year old right-handed woman with depression and anxiety who presents for evaluation of tremor.  Records and images were personally reviewed where available.    She has an unusual past medical history.  In 2005, she she woke up with right leg weakness.  This spread to include weakness of the right arm as well.  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  Her deep tendon reflexes were actually brisk to normal, however.  MRI of brain revealed white matter disease.  MRI of cervical spine revealed degenerative changes but no abnormal cord signal.  An LP was performed, which revealed protein 50, glucose of 51, IgG index 0.5 and negative OCBs.  EMG was performed, which was reportedly unremarkable.  SPEP/IFE was negative.  LEMS antibodies were negative.  She subsequently developed left-sided weakness and increased difficulty breathing and swallowing, so she was monitored in the ICU and received high-dose steroids, but not IVIg or plasma pheresis.  Since that admission, she has had some baseline right leg weakness and occasional falls.  Afterwards, she developed a tremor involving the right hand and later the left hand as well.  After her admission, she was followed over at [facility], where she was diagnosed with essential tremor.  Repeat MRI of brain and cervical spine were reportedly stable.  She was only taking Celexa at that time.  Over the years, she has noted some worsening of her tremor.  She has been on Abilify for 3 years.  The tremor causes difficulty with writing and gets worse when she is nervous.  Tremor does run in the family.  PAST MEDICAL HISTORY: Past Medical History  Diagnosis Date  . Anxiety   . Cancer 2011    Squamus cell skin  . Depression    . Neuromuscular disorder     gullian burrett syndrome  . Osteoporosis     PAST SURGICAL HISTORY: Past Surgical History  Procedure Laterality Date  . Breast surgery N/A 1980    biopsy  . Appendectomy  1960  . Abdominal hysterectomy  1989  . Total abdominal hysterectomy w/ bilateral salpingoophorectomy  1990    MEDICATIONS: Current Outpatient Prescriptions on File Prior to Visit  Medication Sig Dispense Refill  . ARIPiprazole (ABILIFY) 5 MG tablet Take 2.5 mg by mouth daily.      Marland Kitchen buPROPion (WELLBUTRIN XL) 300 MG 24 hr tablet Take 300 mg by mouth daily.      . Calcium Carbonate-Vitamin D (CALCIUM 600+D HIGH POTENCY PO) Take 1 tablet by mouth daily.      . clorazepate (TRANXENE) 7.5 MG tablet Take 7.5 mg by mouth 2 (two) times daily as needed for anxiety (!5 mg at bed time).      . temazepam (RESTORIL) 15 MG capsule Take 30 mg by mouth at bedtime as needed for sleep.       No current facility-administered medications on file prior to visit.    ALLERGIES: Allergies  Allergen Reactions  . Cephalexin     FAMILY HISTORY: Family History  Problem Relation Age of Onset  . Cancer Mother   . Mental illness Sister   . Depression Brother   . Mental illness Brother     SOCIAL HISTORY: History   Social History  . Marital Status: Widowed  Spouse Name: N/A    Number of Children: N/A  . Years of Education: N/A   Occupational History  . Not on file.   Social History Main Topics  . Smoking status: Former Smoker -- 0.20 packs/day    Types: Cigarettes    Quit date: 06/14/1977  . Smokeless tobacco: Never Used  . Alcohol Use: Yes     Comment: social   . Drug Use: No  . Sexual Activity: No   Other Topics Concern  . Not on file   Social History Narrative  . No narrative on file    REVIEW OF SYSTEMS: Constitutional: No fevers, chills, or sweats, no generalized fatigue, change in appetite Eyes: No visual changes, double vision, eye pain Ear, nose and throat: No  hearing loss, ear pain, nasal congestion, sore throat Cardiovascular: No chest pain, palpitations Respiratory:  No shortness of breath at rest or with exertion, wheezes GastrointestinaI: No nausea, vomiting, diarrhea, abdominal pain, fecal incontinence Genitourinary:  No dysuria, urinary retention or frequency Musculoskeletal:  No neck pain, back pain Integumentary: No rash, pruritus, skin lesions Neurological: as above Psychiatric: Depression, anxiety, insomnia Endocrine: No palpitations, fatigue, diaphoresis, mood swings, change in appetite, change in weight, increased thirst Hematologic/Lymphatic:  No anemia, purpura, petechiae. Allergic/Immunologic: no itchy/runny eyes, nasal congestion, recent allergic reactions, rashes  PHYSICAL EXAM: Filed Vitals:   07/16/13 0753  BP: 150/80  Pulse: 80  Temp: 98 F (36.7 C)  Resp: 16   General: No acute distress Head:  Normocephalic/atraumatic Neck: supple, no paraspinal tenderness, full range of motion Back: No paraspinal tenderness Heart: regular rate and rhythm Lungs: Clear to auscultation bilaterally. Vascular: No carotid bruits. Neurological Exam: Mental status: alert and oriented to person, place, and time, speech fluent and not dysarthric, language intact. Cranial nerves: CN I: not tested CN II: pupils equal, round and reactive to light, visual fields intact, fundi unremarkable. CN III, IV, VI:  full range of motion, no nystagmus, no ptosis CN V: facial sensation intact CN VII: upper and lower face symmetric CN VIII: hearing intact CN IX, X: gag intact, uvula midline CN XI: sternocleidomastoid and trapezius muscles intact CN XII: tongue midline Bulk & Tone: normal, no fasciculations. Motor: 5-/5 in right hip flexion, otherwise 5/5.  No rigidity or bradykinesia.  Postural and kinetic tremor of both hands noted, right worse than left, and occasionally at rest.  Tremor is of variable amplitude and is reduced or completely resolves  when distracted. Sensation: pinprick and vibration intact. Deep Tendon Reflexes: 2-3+ throughout, toes down Finger to nose testing: postural and kinetic tremor bilaterally, right worse than left, and improves when distracted.  No dysmetria Heel to shin: no dysmetria Gait: cautious gait but no ataxia.  Able to turn, walk on toes and heels.  Some stumbling and unsteadiness with tandem walk. Romberg negative.  IMPRESSION: 1.  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 2.  History of AIDP.  Unusual presentation and I am not certain if this was an accurate diagnosis.  PLAN: Because I feel this tremor is psychogenic, I don't think initiating medications such as propranolol or Mysoline would be helpful.  Also, since I don't feel it is related to medication, I would not change any of her current regimen if not necessary.  If symptoms worsen, she may contact me for re-evaluation.  45 minutes spent with patient, over 50% spent reviewing outside records, counseling and coordinating care.  Thank you for allowing me to take part  in the care of this patient.  Metta Clines, DO  CC:  [name], MD  Deborra Medina, MD

## 2013-07-16 NOTE — Patient Instructions (Signed)
I think your tremors are psychogenic tremors, meaning that they are a manifestation of symptoms from the mind rather than the brain.  It can be related to depression or anxiety.  These tremors are not treated with typical medications used for tremor, so I wouldn't want to add another medication on top of the other medications you are already on.  Also, I don't think they are related to the medications you are already on.  I will send my note to Dr. Casimiro Needle to address the proper management for this.  Please call with any questions or concerns.

## 2013-07-31 ENCOUNTER — Ambulatory Visit: Payer: 59 | Admitting: Internal Medicine

## 2013-08-14 ENCOUNTER — Encounter: Payer: Self-pay | Admitting: Internal Medicine

## 2013-08-14 ENCOUNTER — Ambulatory Visit (INDEPENDENT_AMBULATORY_CARE_PROVIDER_SITE_OTHER): Payer: Medicare Other | Admitting: Internal Medicine

## 2013-08-14 VITALS — BP 138/78 | HR 60 | Temp 98.0°F | Resp 14 | Wt 169.5 lb

## 2013-08-14 DIAGNOSIS — Z79899 Other long term (current) drug therapy: Secondary | ICD-10-CM

## 2013-08-14 DIAGNOSIS — M810 Age-related osteoporosis without current pathological fracture: Secondary | ICD-10-CM | POA: Diagnosis not present

## 2013-08-14 DIAGNOSIS — E559 Vitamin D deficiency, unspecified: Secondary | ICD-10-CM | POA: Diagnosis not present

## 2013-08-14 NOTE — Progress Notes (Signed)
Patient ID: Holly [name], female   DOB: [birth year], 68 y.o.   MRN: [id number]  Patient Active Problem List   Diagnosis Date Noted  . Encounter for screening colonoscopy 03/16/2013  . Routine general medical examination at a health care facility 03/16/2013  . Right leg weakness 01/23/2013  . History of Guillain-Barre syndrome 01/23/2013  . Radial fracture 01/23/2013  . 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 UNSPECIFIED VITAMIN D DEFICIENCY 08/26/2008  . UNSPECIFIED ANEMIA 08/26/2008  . Recurrent depressive disorder, current episode moderate 07/29/2008  . FATIGUE 07/29/2008  . WEIGHT LOSS 07/29/2008    Subjective:  CC:   Chief Complaint  Patient presents with  . Follow-up    on test?    HPI:   Holly [name] is a 68 y.o. female who presents for  Follow up on depression,  Wt loss and osteoporosis by recent DEXA scan with T  Score -2.5 .  Her depression has improved markedly with medication management.  She is less lethargic and has gained 3 lbs since her last visit.  She has a recent  history of radial fracture in July 2014 after falling in the yard. No history of tobacco abuse or high risk medications, but severed severe muscle weakness secondary to GBS several years ago, and wenr through surgical menopause at age 37. Marland Kitchen     Past Medical History  Diagnosis Date  . Anxiety   . Cancer 2011    Squamus cell skin  . Depression   . Neuromuscular disorder     gullian burrett syndrome  . Osteoporosis     Past Surgical History  Procedure Laterality Date  . Breast surgery N/A 1980    biopsy  . Appendectomy  1960  . Abdominal hysterectomy  1989  . Total abdominal hysterectomy w/ bilateral salpingoophorectomy  1990       The following portions of the patient's history were reviewed and updated as appropriate: Allergies, current medications, and problem list.    Review of Systems:   Patient denies headache, fevers, malaise, unintentional weight loss, skin rash, eye  pain, sinus congestion and sinus pain, sore throat, dysphagia,  hemoptysis , cough, dyspnea, wheezing, chest pain, palpitations, orthopnea, edema, abdominal pain, nausea, melena, diarrhea, constipation, flank pain, dysuria, hematuria, urinary  Frequency, nocturia, numbness, tingling, seizures,  Focal weakness, Loss of consciousness,  Tremor, insomnia, depression, anxiety, and suicidal ideation.     History   Social History  . Marital Status: Widowed    Spouse Name: N/A    Number of Children: N/A  . Years of Education: N/A   Occupational History  . Not on file.   Social History Main Topics  . Smoking status: Former Smoker -- 0.20 packs/day    Types: Cigarettes    Quit date: 06/14/1977  . Smokeless tobacco: Never Used  . Alcohol Use: Yes     Comment: social   . Drug Use: No  . Sexual Activity: No   Other Topics Concern  . Not on file   Social History Narrative  . No narrative on file    Objective:  Filed Vitals:   08/14/13 1634  BP: 138/78  Pulse: 60  Temp: 98 F (36.7 C)  Resp: 14     General appearance: alert, cooperative and appears stated age Ears: normal TM's and external ear canals both ears Throat: lips, mucosa, and tongue normal; teeth and gums normal Neck: no adenopathy, no carotid bruit, supple, symmetrical, trachea midline and thyroid not enlarged, symmetric, no tenderness/mass/nodules Back: symmetric, no curvature.  ROM normal. No CVA tenderness. Lungs: clear to auscultation bilaterally Heart: regular rate and rhythm, S1, S2 normal, no murmur, click, rub or gallop Abdomen: soft, non-tender; bowel sounds normal; no masses,  no organomegaly Pulses: 2+ and symmetric Skin: Skin color, texture, turgor normal. No rashes or lesions Lymph nodes: Cervical, supraclavicular, and axillary nodes normal.  Assessment and Plan:  Postmenopausal osteoporosis With confirmative T scores and history of recent wrist fracture in 2014 secondary to fall. Secondary to  surgical menopause at age 67 followed by only 3 years of oral hormone therapy. Patient has deferred use of alendronate which I agree may be problematic given her history of Guillain-Barr affecting the bulbar muscles. Will try to get Prolia approved, and if not,  Evista.    A total of 25 minutes was spent with patient more than half of which was spent in counseling, reviewing records from other prviders and coordination of care.  Updated Medication List Outpatient Encounter Prescriptions as of 08/14/2013  Medication Sig  . ARIPiprazole (ABILIFY) 5 MG tablet Take 2.5 mg by mouth daily.  Marland Kitchen buPROPion (WELLBUTRIN XL) 300 MG 24 hr tablet Take 300 mg by mouth daily.  . Calcium Carbonate-Vitamin D (CALCIUM 600+D HIGH POTENCY PO) Take 1 tablet by mouth daily.  . Cholecalciferol (VITAMIN D3) 2000 UNITS CHEW Chew 1 capsule by mouth daily.  . clorazepate (TRANXENE) 7.5 MG tablet Take 7.5 mg by mouth 2 (two) times daily as needed for anxiety (!5 mg at bed time).  . Levomilnacipran HCl ER (FETZIMA) 20 MG CP24 Take 20 mg by mouth.  . temazepam (RESTORIL) 15 MG capsule Take 30 mg by mouth at bedtime as needed for sleep.     Orders Placed This Encounter  Procedures  . Vit D  25 hydroxy (rtn osteoporosis monitoring)  . Comp Met (CMET)    No Follow-up on file.

## 2013-08-14 NOTE — Progress Notes (Signed)
Pre-visit discussion using our clinic review tool. No additional management support is needed unless otherwise documented below in the visit note.  

## 2013-08-14 NOTE — Patient Instructions (Signed)
You need 1800 mg calcium in your diet  Try to get 2/3 of this through diet and 1/3 through supplements  Continue 2000 units of Vit D 3 daily until you hear from [place]  We will try to get Prolia approved for your osteoporosis treatment   Osteoporosis Throughout your life, your body breaks down old bone and replaces it with new bone. As you get older, your body does not replace bone as quickly as it breaks it down. By the age of 73 years, most people begin to gradually lose bone because of the imbalance between bone loss and replacement. Some people lose more bone than others. Bone loss beyond a specified normal degree is considered osteoporosis.  Osteoporosis affects the strength and durability of your bones. The inside of the ends of your bones and your flat bones, like the bones of your pelvis, look like honeycomb, filled with tiny open spaces. As bone loss occurs, your bones become less dense. This means that the open spaces inside your bones become bigger and the walls between these spaces become thinner. This makes your bones weaker. Bones of a person with osteoporosis can become so weak that they can break (fracture) during minor accidents, such as a simple fall. CAUSES  The following factors have been associated with the development of osteoporosis:  Smoking.  Drinking more than 2 alcoholic drinks several days per week.  Long-term use of certain medicines:  Corticosteroids.  Chemotherapy medicines.  Thyroid medicines.  Antiepileptic medicines.  Gonadal hormone suppression medicine.  Immunosuppression medicine.  Being underweight.  Lack of physical activity.  Lack of exposure to the sun. This can lead to vitamin D deficiency.  Certain medical conditions:  Certain inflammatory bowel diseases, such as Crohn disease and ulcerative colitis.  Diabetes.  Hyperthyroidism.  Hyperparathyroidism. RISK FACTORS Anyone can develop osteoporosis. However, the following factors can  increase your risk of developing osteoporosis:  Gender Women are at higher risk than men.  Age Being older than 24 years increases your risk.  Ethnicity White and Asian people have an increased risk.  Weight Being extremely underweight can increase your risk of osteoporosis.  Family history of osteoporosis Having a family member who has developed osteoporosis can increase your risk. SYMPTOMS  Usually, people with osteoporosis have no symptoms.  DIAGNOSIS  Signs during a physical exam that may prompt your caregiver to suspect osteoporosis include:  Decreased height. This is usually caused by the compression of the bones that form your spine (vertebrae) because they have weakened and become fractured.  A curving or rounding of the upper back (kyphosis). To confirm signs of osteoporosis, your caregiver may request a procedure that uses 2 low-dose X-ray beams with different levels of energy to measure your bone mineral density (dual-energy X-ray absorptiometry [DXA]). Also, your caregiver may check your level of vitamin D. TREATMENT  The goal of osteoporosis treatment is to strengthen bones in order to decrease the risk of bone fractures. There are different types of medicines available to help achieve this goal. Some of these medicines work by slowing the processes of bone loss. Some medicines work by increasing bone density. Treatment also involves making sure that your levels of calcium and vitamin D are adequate. PREVENTION  There are things you can do to help prevent osteoporosis. Adequate intake of calcium and vitamin D can help you achieve optimal bone mineral density. Regular exercise can also help, especially resistance and weight-bearing activities. If you smoke, quitting smoking is an important part of osteoporosis  prevention. MAKE SURE YOU:  Understand these instructions.  Will watch your condition.  Will get help right away if you are not doing well or get worse. FOR MORE  INFORMATION www.osteo.org and EquipmentWeekly.com.ee Document Released: 03/10/2005 Document Revised: 09/25/2012 Document Reviewed: 05/15/2011 [facility] Patient Information 2014 [place], [place].

## 2013-08-15 LAB — COMPREHENSIVE METABOLIC PANEL
ALT: 12 U/L (ref 0–35)
AST: 17 U/L (ref 0–37)
Albumin: 4.3 g/dL (ref 3.5–5.2)
Alkaline Phosphatase: 64 U/L (ref 39–117)
BUN: 19 mg/dL (ref 6–23)
CALCIUM: 9.7 mg/dL (ref 8.4–10.5)
CHLORIDE: 104 meq/L (ref 96–112)
CO2: 28 meq/L (ref 19–32)
Creatinine, Ser: 0.8 mg/dL (ref 0.4–1.2)
GFR: 72.2 mL/min (ref >60.00)
Glucose, Bld: 81 mg/dL (ref 70–99)
POTASSIUM: 4.8 meq/L (ref 3.5–5.1)
SODIUM: 139 meq/L (ref 135–145)
TOTAL PROTEIN: 7.5 g/dL (ref 6.0–8.3)
Total Bilirubin: 0.6 mg/dL (ref 0.3–1.2)

## 2013-08-15 LAB — VITAMIN D 25 HYDROXY (VIT D DEFICIENCY, FRACTURES): VIT D 25 HYDROXY: 40 ng/mL (ref 30–89)

## 2013-08-16 ENCOUNTER — Encounter: Payer: Self-pay | Admitting: Internal Medicine

## 2013-08-16 ENCOUNTER — Encounter: Payer: Self-pay | Admitting: *Deleted

## 2013-08-16 NOTE — Assessment & Plan Note (Signed)
With confirmative T scores and history of recent wrist fracture in 2014 secondary to fall. Secondary to surgical menopause at age 68 followed by only 3 years of oral hormone therapy. Patient has deferred use of alendronate which I agree may be problematic given her history of Guillain-Barr affecting the bulbar muscles. Will try to get Prolia approved, and if not,  Evista.   

## 2013-09-03 DIAGNOSIS — F3342 Major depressive disorder, recurrent, in full remission: Secondary | ICD-10-CM | POA: Diagnosis not present

## 2013-09-26 ENCOUNTER — Telehealth: Payer: Self-pay | Admitting: Internal Medicine

## 2013-09-26 NOTE — Telephone Encounter (Signed)
I just spoke w/Dee at [facility] and she says this was approved 08/28/2013.  She says patient has a $35 copay and is going to re-fax the approval info.  Hope this helps, thank you.

## 2013-09-26 NOTE — Telephone Encounter (Signed)
FYI: Patient is coming in on 10/10/13 at 8:00 for this injection. She requested this time of day due to her work schedule.

## 2013-09-26 NOTE — Telephone Encounter (Signed)
Will you please check the status of this Prolia request.

## 2013-09-26 NOTE — Telephone Encounter (Signed)
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 I want her to get Prolia,            How do I initiate and expedite an application for Prolia currently?            Dr. Derrel Nip ------

## 2013-09-26 NOTE — Telephone Encounter (Signed)
Left voicemail asking pt to return my call so we can get this scheduled for her.

## 2013-10-10 ENCOUNTER — Encounter (INDEPENDENT_AMBULATORY_CARE_PROVIDER_SITE_OTHER): Payer: Self-pay

## 2013-10-10 ENCOUNTER — Ambulatory Visit (INDEPENDENT_AMBULATORY_CARE_PROVIDER_SITE_OTHER): Payer: Medicare Other | Admitting: *Deleted

## 2013-10-10 DIAGNOSIS — M810 Age-related osteoporosis without current pathological fracture: Secondary | ICD-10-CM | POA: Diagnosis not present

## 2013-10-10 MED ORDER — DENOSUMAB 60 MG/ML ~~LOC~~ SOLN
60.0000 mg | Freq: Once | SUBCUTANEOUS | Status: AC
  Administered 2013-10-10: 60 mg via SUBCUTANEOUS

## 2013-12-05 DIAGNOSIS — F3342 Major depressive disorder, recurrent, in full remission: Secondary | ICD-10-CM | POA: Diagnosis not present

## 2013-12-24 DIAGNOSIS — F3342 Major depressive disorder, recurrent, in full remission: Secondary | ICD-10-CM | POA: Diagnosis not present

## 2014-02-22 DIAGNOSIS — F3342 Major depressive disorder, recurrent, in full remission: Secondary | ICD-10-CM | POA: Diagnosis not present

## 2014-04-08 ENCOUNTER — Telehealth: Payer: Self-pay | Admitting: Internal Medicine

## 2014-04-08 NOTE — Telephone Encounter (Signed)
The patient called today to schedule her prolia injection. She is due on 10.29.15. Please Help.

## 2014-04-09 NOTE — Telephone Encounter (Signed)
I have sent pt's info for Prolia insurance verification and will notify you once I have a response. Thank you. °

## 2014-04-30 NOTE — Telephone Encounter (Signed)
Have not received information either please re-fax.

## 2014-04-30 NOTE — Telephone Encounter (Signed)
I have rec'd pt's Prolia insurance verification and [employer] is requiring a prior authorization.  I have completed as much of the p/a form as possible but there are still some areas Dr. Derrel Nip will need to complete and sign.  You can return the completed form to me at [phone] and also please be sure to let me know which date of office notes I need to print to send to [facility] w/the p/a form.  I have faxed the form to you at [phone].  If you have any questions, please let me know. Thank you.

## 2014-05-01 NOTE — Telephone Encounter (Signed)
Placed in Tullo's box for signature

## 2014-05-01 NOTE — Telephone Encounter (Signed)
I think the fax # was busy every time I tried yesterday.  I have just re-faxed it, so hopefully it goes thru.  Please let me know if you do not get it.  Thank you.

## 2014-05-03 DIAGNOSIS — F3342 Major depressive disorder, recurrent, in full remission: Secondary | ICD-10-CM | POA: Diagnosis not present

## 2014-05-14 NOTE — Telephone Encounter (Signed)
Per Antionette H., pt still has prior authorization in effect 05/02/2014-06/13/2014 w/a ref #[id number]. Ms. Veldhuizen's primary insurance, [facility] will cover 80% of the Prolia leaving her w/a 20% co-insurance (approx. $180) and since she has not met her deductible w/her secondary she will have to pay all that [facility] doesn't which means her estimated responsibility will be $180.  Please let pt know this is an estimate and we will not know an exact amt until both insurances have responded. I have sent a copy of the summary of benefits to be scanned into pt's chart. Please note, if pt cannot afford $180 for Prolia, she can contact them directly to see if she qualifies for any of their assistance programs.  Their # is [phone] and if she qualifies they will instruct her further.  If you have any questions, please let me know. Thank you.

## 2014-05-14 NOTE — Telephone Encounter (Signed)
In addition to the info below, if there is an OV billed pt will have an additional $81 co-pay, so it would be $81+180 for a total estimated responsibility of $261.

## 2014-05-14 NOTE — Telephone Encounter (Signed)
Left message for pt to return my call.

## 2014-05-15 NOTE — Telephone Encounter (Signed)
Left message for pt to return my call.

## 2014-05-17 ENCOUNTER — Encounter: Payer: Self-pay | Admitting: *Deleted

## 2014-05-17 NOTE — Telephone Encounter (Signed)
Pt has not returned my calls, mailed letter with information.

## 2014-05-30 ENCOUNTER — Ambulatory Visit: Payer: Medicare Other

## 2014-06-05 ENCOUNTER — Ambulatory Visit (INDEPENDENT_AMBULATORY_CARE_PROVIDER_SITE_OTHER): Payer: Medicare Other | Admitting: *Deleted

## 2014-06-05 DIAGNOSIS — M810 Age-related osteoporosis without current pathological fracture: Secondary | ICD-10-CM | POA: Diagnosis not present

## 2014-06-05 MED ORDER — DENOSUMAB 60 MG/ML ~~LOC~~ SOLN
60.0000 mg | Freq: Once | SUBCUTANEOUS | Status: AC
  Administered 2014-06-05: 60 mg via SUBCUTANEOUS

## 2014-08-09 DIAGNOSIS — F3342 Major depressive disorder, recurrent, in full remission: Secondary | ICD-10-CM | POA: Diagnosis not present

## 2014-10-04 NOTE — Consult Note (Signed)
PATIENT NAME:  Holly [name], ANTON MR#:  [id number] DATE OF BIRTH:  13-Oct-1945  DATE OF CONSULTATION:  12/24/2012  CONSULTING PHYSICIAN:  Claud Kelp, MD  HPI: Holly [name] is a 69 year old female who was out pruning a tree this morning when she lost her balance and fell on her outstretched right hand. She is a right-hand dominant female, and she noted immediate pain and deformity about her right wrist.   PAST MEDICAL HISTORY: She denies any major health problems.   MEDICATIONS: She denies taking any medications at home.   REVIEW OF SYSTEMS: Noncontributory.   PHYSICAL EXAMINATION: She has an obvious deformity about her right wrist with significant dorsal angulation. She has a small volar punctate lesion on the right volar radial aspect of her wrist. She has intact sensation in the radial, median, and ulnar nerve distributions distally, with brisk capillary refill to all of her digits.   RADIOGRAPHS: X-rays taken of her right wrist demonstrate and extra-articular distal radius fracture with complete dorsal displacement.   ASSESSMENT: Right distal radius fracture.   PLAN: The patient was consented for a closed reduction and immobilization of her right wrist. Post-reduction radiographs demonstrated acceptable alignment. She was counseled that she is still at high risk for this to displace and will need close followup with serial x-rays to ensure she is maintaining reduction. If she maintains her reduction she will be amenable to continue closed management of this fracture. However if her fracture displaces she may end needing operative fixation.   Given her very small punctate lesion this is essentially a clean, in and out open distal radius fracture with the wound measuring literally 1 mm.  The wound was copiously irrigated and dressed with a Betadine dressing and she was given 1 dose of IV antibiotics in the Emergency Room. She will be sent home on a course of oral antibiotics as well, and follow-up  within one week for repeat radiographs.    ____________________________ Claud Kelp, MD tte:dm D: 12/24/2012 10:13:51 ET T: 12/24/2012 14:56:39 ET JOB#: [id number]  cc: Claud Kelp, MD, <Dictator> Claud Kelp MD ELECTRONICALLY SIGNED 12/24/2012 18:14

## 2014-11-04 DIAGNOSIS — F3342 Major depressive disorder, recurrent, in full remission: Secondary | ICD-10-CM | POA: Diagnosis not present

## 2015-01-17 ENCOUNTER — Telehealth: Payer: Self-pay

## 2015-01-17 NOTE — Telephone Encounter (Signed)
Patient called the triage line, she wanted to know if it is time for her Prolia injection.  Last injection was 12.23.15.  I will send message on to [facility] to start the insurance process.

## 2015-01-21 ENCOUNTER — Telehealth: Payer: Self-pay | Admitting: Internal Medicine

## 2015-01-21 NOTE — Telephone Encounter (Signed)
Pt needs to sch an appt for her prolia injection. Pt states she needs to get insurance approval first. Thank You!

## 2015-01-22 NOTE — Telephone Encounter (Signed)
Left message on VM to return call 

## 2015-01-22 NOTE — Telephone Encounter (Signed)
I have rec'd pt's insurance verification for Prolia.  There is still a [employer] p/a [id number] effective 06/14/2014-05/02/2015 on file.  Pt's primary insurance, [employer] will cover 80% of the admin and cost of Prolia, leaving pt w/an estimated responsibility of a 20% co-insurance (approx $180).  However, her secondary insurance, [employer]BS will coordinate benefits and considers the Part B deductible (pt has met) and co-insurance at 100% up to the secondary plan's allowed amt.  This means pt will have an estimated responsibility of $0 w/out an OV; if an OV is billed pt will have an estimated responsibility of a $39 co-pay.  Please make pt aware this is an estimate and we won't know an exact amt until both insurances have paid. I have sent a copy of the summary of benefits and the [employer]BS p/a to be scanned into her chart. If you have any questions, please let me know. Thank you.

## 2015-01-22 NOTE — Telephone Encounter (Signed)
I have electronically submitted pt's info for Prolia insurance verification and will notify you once I have a response. Thank you. °

## 2015-01-27 NOTE — Telephone Encounter (Signed)
Left a message to return our call.

## 2015-01-28 NOTE — Telephone Encounter (Signed)
Left message for patient to call office.  

## 2015-01-29 NOTE — Telephone Encounter (Signed)
Scheduled appointment with Nitchia.

## 2015-01-31 DIAGNOSIS — F3342 Major depressive disorder, recurrent, in full remission: Secondary | ICD-10-CM | POA: Diagnosis not present

## 2015-02-05 ENCOUNTER — Ambulatory Visit (INDEPENDENT_AMBULATORY_CARE_PROVIDER_SITE_OTHER): Payer: Medicare Other

## 2015-02-05 DIAGNOSIS — M810 Age-related osteoporosis without current pathological fracture: Secondary | ICD-10-CM

## 2015-02-05 MED ORDER — DENOSUMAB 60 MG/ML ~~LOC~~ SOLN
60.0000 mg | Freq: Once | SUBCUTANEOUS | Status: AC
  Administered 2015-02-05: 60 mg via SUBCUTANEOUS

## 2015-02-05 NOTE — Progress Notes (Signed)
Patient came in for Prolia injection.  Received in left Arm.  Patient tolerated well

## 2015-03-12 ENCOUNTER — Ambulatory Visit: Payer: Self-pay | Admitting: Physician Assistant

## 2015-03-12 ENCOUNTER — Encounter: Payer: Self-pay | Admitting: Physician Assistant

## 2015-03-12 VITALS — BP 140/80 | Temp 98.6°F

## 2015-03-12 DIAGNOSIS — B9789 Other viral agents as the cause of diseases classified elsewhere: Principal | ICD-10-CM

## 2015-03-12 DIAGNOSIS — J069 Acute upper respiratory infection, unspecified: Secondary | ICD-10-CM

## 2015-03-12 NOTE — Progress Notes (Signed)
S: C/o runny nose and congestion for 3 days, no fever, chills, cp/sob, v/d; mucus is clear, denies cough, reports some body aches .   Using otc meds: nyquil  O: PE: perrl eomi, normocephalic, tms dull, nasal mucosa pale pink and not swollen, throat injected, neck supple no lymph, lungs c t a, cv rrr, neuro intact  A:  Acute viral uri  P: drink fluids, continue regular meds , use otc meds of choice, return if not improving in 5 days, return earlier if worsening

## 2015-05-30 DIAGNOSIS — F3342 Major depressive disorder, recurrent, in full remission: Secondary | ICD-10-CM | POA: Diagnosis not present

## 2015-06-11 ENCOUNTER — Encounter: Payer: Self-pay | Admitting: *Deleted

## 2015-06-30 ENCOUNTER — Telehealth: Payer: Self-pay | Admitting: Internal Medicine

## 2015-06-30 ENCOUNTER — Encounter: Payer: Self-pay | Admitting: Internal Medicine

## 2015-06-30 ENCOUNTER — Ambulatory Visit (INDEPENDENT_AMBULATORY_CARE_PROVIDER_SITE_OTHER): Payer: Medicare Other | Admitting: Internal Medicine

## 2015-06-30 VITALS — BP 150/88 | HR 70 | Temp 97.8°F | Resp 12 | Ht 66.0 in | Wt 189.5 lb

## 2015-06-30 DIAGNOSIS — E559 Vitamin D deficiency, unspecified: Secondary | ICD-10-CM

## 2015-06-30 DIAGNOSIS — R5383 Other fatigue: Secondary | ICD-10-CM | POA: Diagnosis not present

## 2015-06-30 DIAGNOSIS — Z1239 Encounter for other screening for malignant neoplasm of breast: Secondary | ICD-10-CM

## 2015-06-30 DIAGNOSIS — F329 Major depressive disorder, single episode, unspecified: Secondary | ICD-10-CM

## 2015-06-30 DIAGNOSIS — E785 Hyperlipidemia, unspecified: Secondary | ICD-10-CM

## 2015-06-30 DIAGNOSIS — Z1159 Encounter for screening for other viral diseases: Secondary | ICD-10-CM | POA: Diagnosis not present

## 2015-06-30 DIAGNOSIS — Z0000 Encounter for general adult medical examination without abnormal findings: Secondary | ICD-10-CM

## 2015-06-30 LAB — CBC WITH DIFFERENTIAL/PLATELET
Basophils Absolute: 0 10*3/uL (ref 0.0–0.1)
Basophils Relative: 0.8 % (ref 0.0–3.0)
EOS ABS: 0.1 10*3/uL (ref 0.0–0.7)
Eosinophils Relative: 2.3 % (ref 0.0–5.0)
HCT: 38 % (ref 36.0–46.0)
HEMOGLOBIN: 12.6 g/dL (ref 12.0–15.0)
LYMPHS ABS: 1.8 10*3/uL (ref 0.7–4.0)
Lymphocytes Relative: 40.4 % (ref 12.0–46.0)
MCHC: 33.3 g/dL (ref 30.0–36.0)
MCV: 92.2 fl (ref 78.0–100.0)
MONO ABS: 0.4 10*3/uL (ref 0.1–1.0)
Monocytes Relative: 8.9 % (ref 3.0–12.0)
NEUTROS PCT: 47.6 % (ref 43.0–77.0)
Neutro Abs: 2.2 10*3/uL (ref 1.4–7.7)
Platelets: 256 10*3/uL (ref 150.0–400.0)
RBC: 4.12 Mil/uL (ref 3.87–5.11)
RDW: 13.8 % (ref 11.5–15.5)
WBC: 4.5 10*3/uL (ref 4.0–10.5)

## 2015-06-30 LAB — LIPID PANEL
Cholesterol: 234 mg/dL — ABNORMAL HIGH (ref 0–200)
HDL: 82.3 mg/dL (ref >39.00)
LDL CALC: 139 mg/dL — AB (ref 0–99)
NONHDL: 152.11
Total CHOL/HDL Ratio: 3
Triglycerides: 68 mg/dL (ref 0.0–149.0)
VLDL: 13.6 mg/dL (ref 0.0–40.0)

## 2015-06-30 LAB — LDL CHOLESTEROL, DIRECT: Direct LDL: 129 mg/dL

## 2015-06-30 LAB — COMPREHENSIVE METABOLIC PANEL
ALT: 12 U/L (ref 0–35)
AST: 13 U/L (ref 0–37)
Albumin: 4.3 g/dL (ref 3.5–5.2)
Alkaline Phosphatase: 51 U/L (ref 39–117)
BUN: 18 mg/dL (ref 6–23)
CHLORIDE: 106 meq/L (ref 96–112)
CO2: 24 mEq/L (ref 19–32)
Calcium: 9.6 mg/dL (ref 8.4–10.5)
Creatinine, Ser: 0.84 mg/dL (ref 0.40–1.20)
GFR: 71.79 mL/min (ref >60.00)
GLUCOSE: 84 mg/dL (ref 70–99)
POTASSIUM: 5.1 meq/L (ref 3.5–5.1)
SODIUM: 143 meq/L (ref 135–145)
Total Bilirubin: 0.5 mg/dL (ref 0.2–1.2)
Total Protein: 7.2 g/dL (ref 6.0–8.3)

## 2015-06-30 LAB — VITAMIN D 25 HYDROXY (VIT D DEFICIENCY, FRACTURES): VITD: 16.95 ng/mL — ABNORMAL LOW (ref 30.00–100.00)

## 2015-06-30 LAB — TSH: TSH: 2.1 u[IU]/mL (ref 0.35–4.50)

## 2015-06-30 NOTE — Telephone Encounter (Signed)
I have electronically submitted pt's info for Prolia insurance verification and will notify you once I have a response. Thank you. °

## 2015-06-30 NOTE — Progress Notes (Signed)
Pre-visit discussion using our clinic review tool. No additional management support is needed unless otherwise documented below in the visit note.  

## 2015-06-30 NOTE — Telephone Encounter (Signed)
Patient is due Prolia Injection 08/11/15 Patient needs PA for Prolia. Can you please start the PA process.

## 2015-06-30 NOTE — Progress Notes (Signed)
Patient ID: Holly [name], female    DOB: 1945/09/21  Age: 70 y.o. MRN: [id number]  The patient is here for annual Medicare wellness examination and management of other chronic and acute problems.   The risk factors are reflected in the social history.  The roster of all physicians providing medical care to patient - is listed in the Snapshot section of the chart.  Activities of daily living:  The patient is 100% independent in all ADLs: dressing, toileting, feeding as well as independent mobility  Home safety : The patient has smoke detectors in the home. They wear seatbelts.  There are no firearms at home. There is no violence in the home.   There is no risks for hepatitis, STDs or HIV. There is no   history of blood transfusion. They have no travel history to infectious disease endemic areas of the world.  The patient has seen their dentist in the last six month. They have seen their eye doctor in the last year. They admit to slight hearing difficulty with regard to whispered voices and some television programs.  They have deferred audiologic testing in the last year.  They do not  have excessive sun exposure. Discussed the need for sun protection: hats, long sleeves and use of sunscreen if there is significant sun exposure.   Diet: the importance of a healthy diet is discussed. They do have a healthy diet.  The benefits of regular aerobic exercise were discussed. She walks 4 times per week ,  20 minutes.   Depression screen: there are no signs or vegative symptoms of depression- irritability, change in appetite, anhedonia, sadness/tearfullness.  Cognitive assessment: the patient manages all their financial and personal affairs and is actively engaged. They could relate day,date,year and events; recalled 2/3 objects at 3 minutes; performed clock-face test normally.  The following portions of the patient's history were reviewed and updated as appropriate: allergies, current medications, past  family history, past medical history,  past surgical history, past social history  and problem list.  Visual acuity was not assessed per patient preference since she has regular follow up with her ophthalmologist. Hearing and body mass index were assessed and reviewed.   During the course of the visit the patient was educated and counseled about appropriate screening and preventive services including : fall prevention , diabetes screening, nutrition counseling, colorectal cancer screening, and recommended immunizations.    CC: The primary encounter diagnosis was Fatigue due to depression. Diagnoses of Vitamin D deficiency, Hyperlipidemia, Need for hepatitis C screening test, Breast cancer screening, Encounter for preventive health examination, and Routine general medical examination at a health care facility were also pertinent to this visit.  Persistent anxiety with insomnia and fatigue,  Weight gain. ,  Sees Dr Casimiro Needle,  Daughter is bipolar ,  On SSI,  soical phobia, father died 6 yars ago,  Worries about who will take care of her after she dies.  History Mageline has a past medical history of Anxiety; Cancer [facility]) (2011); Depression; Neuromuscular disorder ([place]); and Osteoporosis.   She has past surgical history that includes Breast surgery (N/A, 1980); Appendectomy (1960); Abdominal hysterectomy (1989); and Total abdominal hysterectomy w/ bilateral salpingoophorectomy (1990).   Her family history includes Cancer in her mother; Depression in her brother; Mental illness in her brother and sister.She reports that she quit smoking about 38 years ago. Her smoking use included Cigarettes. She smoked 0.20 packs per day. She has never used smokeless tobacco. She reports that she drinks alcohol.  She reports that she does not use illicit drugs.  Outpatient Prescriptions Prior to Visit  Medication Sig Dispense Refill  . ARIPiprazole (ABILIFY) 5 MG tablet Take 2.5 mg by mouth daily.    Marland Kitchen buPROPion  (WELLBUTRIN XL) 300 MG 24 hr tablet Take 300 mg by mouth daily.    . Calcium Carbonate-Vitamin D (CALCIUM 600+D HIGH POTENCY PO) Take 1 tablet by mouth daily.    . Cholecalciferol (VITAMIN D3) 2000 UNITS CHEW Chew 1 capsule by mouth daily.    . clorazepate (TRANXENE) 7.5 MG tablet Take 7.5 mg by mouth 2 (two) times daily as needed for anxiety (!5 mg at bed time).    . Levomilnacipran HCl ER (FETZIMA) 20 MG CP24 Take 20 mg by mouth.    . temazepam (RESTORIL) 15 MG capsule Take 30 mg by mouth at bedtime as needed for sleep.     No facility-administered medications prior to visit.    Review of Systems   Patient denies headache, fevers, malaise, unintentional weight loss, skin rash, eye pain, sinus congestion and sinus pain, sore throat, dysphagia,  hemoptysis , cough, dyspnea, wheezing, chest pain, palpitations, orthopnea, edema, abdominal pain, nausea, melena, diarrhea, constipation, flank pain, dysuria, hematuria, urinary  Frequency, nocturia, numbness, tingling, seizures,  Focal weakness, Loss of consciousness,  Tremor, insomnia, depression, anxiety, and suicidal ideation.      Objective:  BP 150/88 mmHg  Pulse 70  Temp(Src) 97.8 F (36.6 C) (Oral)  Resp 12  Ht 5\' 6"  (1.676 m)  Wt 189 lb 8 oz (85.957 kg)  BMI 30.60 kg/m2  SpO2 98%  Physical Exam  General appearance: alert, cooperative and appears stated age Head: Normocephalic, without obvious abnormality, atraumatic Eyes: conjunctivae/corneas clear. PERRL, EOM's intact. Fundi benign. Ears: normal TM's and external ear canals both ears Nose: Nares normal. Septum midline. Mucosa normal. No drainage or sinus tenderness. Throat: lips, mucosa, and tongue normal; teeth and gums normal Neck: no adenopathy, no carotid bruit, no JVD, supple, symmetrical, trachea midline and thyroid not enlarged, symmetric, no tenderness/mass/nodules Lungs: clear to auscultation bilaterally Breasts: normal appearance, no masses or tenderness Heart:  regular rate and rhythm, S1, S2 normal, no murmur, click, rub or gallop Abdomen: soft, non-tender; bowel sounds normal; no masses,  no organomegaly Extremities: extremities normal, atraumatic, no cyanosis or edema Pulses: 2+ and symmetric Skin: Skin color, texture, turgor normal. No rashes or lesions Neurologic: Alert and oriented X 3, normal strength and tone. Normal symmetric reflexes. Normal coordination and gait.    Assessment & Plan:   Problem List Items Addressed This Visit    Fatigue due to depression - Primary    She continues to endorse fatigue and insomnia, lack of exercise,  Due to anxiety regarding the long term security of her daughter who is financially dependent on patient due to her inability to live independently due to bipolar disorder. Spent 30 minutes discussing her fears and her current plan to provide for duaghter.       Relevant Orders   TSH (Completed)   CBC with Differential/Platelet (Completed)   Comprehensive metabolic panel (Completed)   Routine general medical examination at a health care facility    Annual comprehensive preventive exam was done as well as an evaluation and management of chronic conditions .  During the course of the visit the patient was educated and counseled about appropriate screening and preventive services including :  diabetes screening, lipid analysis with projected  10 year  risk for CAD using the Framingham risk calculator  for women, , nutrition counseling, colorectal cancer screening, and recommended immunizations.  Printed recommendations for health maintenance screenings was given.       RESOLVED: Encounter for preventive health examination    Other Visit Diagnoses    Vitamin D deficiency        Relevant Orders    VITAMIN D 25 Hydroxy (Vit-D Deficiency, Fractures) (Completed)    Hyperlipidemia        Relevant Orders    LDL cholesterol, direct (Completed)    Lipid panel (Completed)    Need for hepatitis C screening test         Relevant Orders    Hepatitis C antibody (Completed)    Breast cancer screening        Relevant Orders    MM DIGITAL SCREENING BILATERAL      A total of 40 minutes was spent with patient more than half of which was spent in counseling patient on the above mentioned issues , reviewing and explaining recent labs and imaging studies done, and coordination of care.  I am having Ms. Giampietro maintain her buPROPion, ARIPiprazole, temazepam, clorazepate, Calcium Carbonate-Vitamin D (CALCIUM 600+D HIGH POTENCY PO), Levomilnacipran HCl ER, and Vitamin D3.  No orders of the defined types were placed in this encounter.    There are no discontinued medications.  Follow-up: No Follow-up on file.   Crecencio Mc, MD

## 2015-06-30 NOTE — Patient Instructions (Signed)

## 2015-06-30 NOTE — Progress Notes (Signed)
cologuard ordered.

## 2015-06-30 NOTE — Telephone Encounter (Signed)
FYI

## 2015-07-01 DIAGNOSIS — Z0000 Encounter for general adult medical examination without abnormal findings: Secondary | ICD-10-CM | POA: Insufficient documentation

## 2015-07-01 LAB — HEPATITIS C ANTIBODY: HCV Ab: NEGATIVE

## 2015-07-01 NOTE — Assessment & Plan Note (Addendum)
She continues to endorse fatigue and insomnia, lack of exercise,  Due to anxiety regarding the long term security of her daughter who is financially dependent on patient due to her inability to live independently due to bipolar disorder. Spent 30 minutes discussing her fears and her current plan to provide for duaghter.

## 2015-07-01 NOTE — Assessment & Plan Note (Signed)

## 2015-07-02 ENCOUNTER — Encounter: Payer: Self-pay | Admitting: *Deleted

## 2015-07-02 MED ORDER — ERGOCALCIFEROL 1.25 MG (50000 UT) PO CAPS: 50000.0000 [IU] | ORAL_CAPSULE | ORAL | Status: DC

## 2015-07-02 NOTE — Addendum Note (Signed)
Addended by: Crecencio Mc on: 07/02/2015 01:38 PM   Modules accepted: Orders, SmartSet

## 2015-07-06 DIAGNOSIS — Z1212 Encounter for screening for malignant neoplasm of rectum: Secondary | ICD-10-CM | POA: Diagnosis not present

## 2015-07-06 DIAGNOSIS — Z1211 Encounter for screening for malignant neoplasm of colon: Secondary | ICD-10-CM | POA: Diagnosis not present

## 2015-07-07 ENCOUNTER — Telehealth: Payer: Self-pay | Admitting: *Deleted

## 2015-07-07 NOTE — Telephone Encounter (Signed)
Patient stated that she received a call on Wednesday, she thinks it could have been about her Prolia vaccine,or a mammogram. Contact  [phone]

## 2015-07-08 NOTE — Telephone Encounter (Signed)
LMOMTCB

## 2015-07-09 NOTE — Telephone Encounter (Signed)
LMOMTCB

## 2015-07-17 LAB — COLOGUARD: COLOGUARD: NEGATIVE

## 2015-07-31 ENCOUNTER — Encounter: Payer: Self-pay | Admitting: Internal Medicine

## 2015-08-05 ENCOUNTER — Telehealth: Payer: Self-pay | Admitting: Internal Medicine

## 2015-08-05 NOTE — Telephone Encounter (Signed)
Notified patient cologuard negative.

## 2015-08-14 ENCOUNTER — Telehealth: Payer: Self-pay | Admitting: Internal Medicine

## 2015-08-14 NOTE — Telephone Encounter (Signed)
I have rec'd Ms. Thull's insurance verification for Prolia and [employer] is requiring a prior authorization.  I have faxed the p/a form to your attn at [phone].  Could you or Dr. Derrel Nip please review questions 1-3 to be sure they are correct, then Dr.Tullo will need to sign the second page.  You may return completed p/a form to me via fax [phone]. Thank you.

## 2015-08-14 NOTE — Telephone Encounter (Signed)
Pt called to sch for her AWV. Pt would like appt around the first week in April in the morning. Call pt @ [phone]. Thank you!

## 2015-08-15 NOTE — Telephone Encounter (Signed)
Place in red folder see Rose's note below.

## 2015-08-18 NOTE — Telephone Encounter (Signed)
Form completed and faxed back to [facility] at [phone].

## 2015-08-18 NOTE — Telephone Encounter (Signed)
Rec'd completed form and faxed it along w/Dexa scan from 10/03/2013 to [facility].  Will let you know once I have a response. Thank you.

## 2015-08-25 DIAGNOSIS — F3342 Major depressive disorder, recurrent, in full remission: Secondary | ICD-10-CM | POA: Diagnosis not present

## 2015-08-28 NOTE — Telephone Encounter (Signed)
I have rec'd Holly [name] for Ms. Sisler's Prolia injection.  The ref # is [id number] and is effective 08/18/2015-06/13/2016.  With an OV Ms. Willden will have an estimated responsibility of a $40 co-pay; w/out an OV she will have an estimated responsibility of $0. Please make pt aware this is an estimate and we will not know an exact amt until insurance(s) has/have paid.  I have sent a copy of the summary of benefits to be scanned into pt's chart.   Once pt recs injection, please let me know actual injection date so I can update the Prolia portal.  If you have any questions, please let me know.  Thank you.

## 2015-08-29 ENCOUNTER — Ambulatory Visit (INDEPENDENT_AMBULATORY_CARE_PROVIDER_SITE_OTHER): Payer: Medicare Other | Admitting: Internal Medicine

## 2015-08-29 ENCOUNTER — Encounter: Payer: Self-pay | Admitting: Internal Medicine

## 2015-08-29 VITALS — BP 136/80 | HR 65 | Temp 97.5°F | Resp 12 | Ht 66.0 in | Wt 184.5 lb

## 2015-08-29 DIAGNOSIS — M6289 Other specified disorders of muscle: Secondary | ICD-10-CM | POA: Diagnosis not present

## 2015-08-29 DIAGNOSIS — R42 Dizziness and giddiness: Secondary | ICD-10-CM

## 2015-08-29 DIAGNOSIS — G252 Other specified forms of tremor: Secondary | ICD-10-CM | POA: Diagnosis not present

## 2015-08-29 DIAGNOSIS — M6281 Muscle weakness (generalized): Secondary | ICD-10-CM

## 2015-08-29 LAB — VITAMIN B12: VITAMIN B 12: 500 pg/mL (ref 211–911)

## 2015-08-29 NOTE — Telephone Encounter (Signed)
Patient notified an would like to proceed with the prolia please order asd once in will proceed with setting up nurse visit for injection.

## 2015-08-29 NOTE — Progress Notes (Signed)
Pre-visit discussion using our clinic review tool. No additional management support is needed unless otherwise documented below in the visit note.  

## 2015-08-29 NOTE — Telephone Encounter (Signed)
Left a detailed message for her to return my call to schedule her appt.  Prolia is in the fridge on POD 1

## 2015-08-29 NOTE — Telephone Encounter (Signed)
I did not know Prolia was here so no patient did not receive at visit.

## 2015-08-29 NOTE — Patient Instructions (Signed)
Your tremor is not typical for parkinson's Disease, and given your history of GBS i think it may be due to another process   I am referring you to Dr Wells Guiles Tat . [facility] neurologist that specializes in movement disorders

## 2015-08-29 NOTE — Telephone Encounter (Signed)
Ok , have her return for Prolia shot

## 2015-08-29 NOTE — Progress Notes (Signed)
Subjective:  Patient ID: Holly [name], female    DOB: 12-29-1945  Age: 70 y.o. MRN: [id number]  CC: The primary encounter diagnosis was Dizziness and giddiness. Diagnoses of Action tremor, Proximal muscle weakness, and Intention tremor were also pertinent to this visit.  HPI AIRIN HASZ presents for evaluation of tremor .  Has been present since having Guillain Barre Syndrome in 2005 , which reportedly affected her right side more than left. The tremor has become more pronounced over the last 6 months.  The tremor affects both arms and legs but is most pronounced in her right arm.    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   Embarassed bc she can't control her hand.  Recurent transient episodes where her Voice has been "fogging up" over the last 6 months. Not improved with clearing her throat. She has developed tremor of the ankle  she dorsiflexes her foot.  No resting tremor, just an intention tremor. l Arm feels "funny' (heavy)  when she has the tremor in the hand. ,  Some Loss of balance resulting in falls,  Other times feels the falls are due to her foot not clearing the fllor during walking.. .  Aggravated by stress.  Having trouble using utensils right hand is worse than left,  So she tries to use her left hand. Still avoids eating soup in  Public.  Also having bladder incontinence for the past 6 months , managing with frequent trips to the bathroom. When she forgets to make timed visits,  She has incontinence when she stands up .  Does not sense a full bladder unitl it is "too late."    Brother has Parkinson's and has been concerned about her tremor .  Her dentist of 20 years is concerned as well.     History of Vit d deficiency ,  currentlly taking 50K IUs weekly since mid January   Sees Dr Glyn Ade for depression.     Outpatient Prescriptions Prior to Visit  Medication Sig Dispense Refill  . ARIPiprazole (ABILIFY) 5 MG tablet Take  2.5 mg by mouth daily.    Marland Kitchen buPROPion (WELLBUTRIN XL) 300 MG 24 hr tablet Take 300 mg by mouth daily.    . Calcium Carbonate-Vitamin D (CALCIUM 600+D HIGH POTENCY PO) Take 1 tablet by mouth daily.    . clorazepate (TRANXENE) 7.5 MG tablet Take 7.5 mg by mouth 2 (two) times daily as needed for anxiety (!5 mg at bed time).    . ergocalciferol (DRISDOL) 50000 units capsule Take 1 capsule (50,000 Units total) by mouth once a week. 12 capsule 0  . Levomilnacipran HCl ER (FETZIMA) 20 MG CP24 Take 20 mg by mouth.    . temazepam (RESTORIL) 15 MG capsule Take 30 mg by mouth at bedtime as needed for sleep.    . Cholecalciferol (VITAMIN D3) 2000 UNITS CHEW Chew 1 capsule by mouth daily. Reported on 08/29/2015     No facility-administered medications prior to visit.    Review of Systems;  Patient denies headache, fevers, malaise, unintentional weight loss, skin rash, eye pain, sinus congestion and sinus pain, sore throat, dysphagia,  hemoptysis , cough, dyspnea, wheezing, chest pain, palpitations, orthopnea, edema, abdominal pain, nausea, melena, diarrhea, constipation, flank pain, dysuria, hematuria, urinary  Frequency, nocturia, numbness, tingling, seizures,   Loss of consciousness,  insomnia, depression, anxiety, and suicidal ideation.      Objective:  BP 136/80 mmHg  Pulse 65  Temp(Src)  97.5 F (36.4 C) (Oral)  Resp 12  Ht 5\' 6"  (1.676 m)  Wt 184 lb 8 oz (83.689 kg)  BMI 29.79 kg/m2  SpO2 99%  BP Readings from Last 3 Encounters:  08/29/15 136/80  06/30/15 150/88  03/12/15 140/80    Wt Readings from Last 3 Encounters:  08/29/15 184 lb 8 oz (83.689 kg)  06/30/15 189 lb 8 oz (85.957 kg)  08/14/13 169 lb 8 oz (76.885 kg)    General appearance: alert, cooperative and appears stated age. Flat affect, but no masked facies.  Ears: normal TM's and external ear canals both ears Throat: lips, mucosa, and tongue normal; teeth and gums normal Neck: no adenopathy, no carotid bruit, supple,  symmetrical, trachea midline and thyroid not enlarged, symmetric, no tenderness/mass/nodules Back: symmetric, no curvature. ROM normal. No CVA tenderness. Lungs: clear to auscultation bilaterally Heart: regular rate and rhythm, S1, S2 normal, no murmur, click, rub or gallop Abdomen: soft, non-tender; bowel sounds normal; no masses,  no organomegaly Pulses: 2+ and symmetric Skin: Skin color, texture, turgor normal. No rashes or lesions Lymph nodes: Cervical, supraclavicular, and axillary nodes normal. Neuro: bilateral hand tremor with any intentional movement of hands,  Proximal muscle weakness noted in both legs and both shoulder. No trouble initiating movement.  Can stand on one leg  For 5 secs.  Gait narrow,  Stares at floor  Does not pick up sped  No results found for: HGBA1C  Lab Results  Component Value Date   CREATININE 0.84 06/30/2015   CREATININE 0.8 08/14/2013   CREATININE 0.9 01/23/2013    Lab Results  Component Value Date   WBC 4.5 06/30/2015   HGB 12.6 06/30/2015   HCT 38.0 06/30/2015   PLT 256.0 06/30/2015   GLUCOSE 84 06/30/2015   CHOL 234* 06/30/2015   TRIG 68.0 06/30/2015   HDL 82.30 06/30/2015   LDLDIRECT 129.0 06/30/2015   LDLCALC 139* 06/30/2015   ALT 12 06/30/2015   AST 13 06/30/2015   NA 143 06/30/2015   K 5.1 06/30/2015   CL 106 06/30/2015   CREATININE 0.84 06/30/2015   BUN 18 06/30/2015   CO2 24 06/30/2015   TSH 2.10 06/30/2015   INR 0.9 12/24/2012     Assessment & Plan:   Problem List Items Addressed This Visit    Intention tremor    Aggravated by proximal muscle weakness.  History of GBS in 2005,  With progression of tremor in hands and feet over the last several months.  Nothing about her exam suggests Parkinson's Disease. But will need Neurology evaluation        Other Visit Diagnoses    Dizziness and giddiness    -  Primary    Relevant Orders    B12 (Completed)    Folate RBC    RPR    Action tremor        Relevant Orders    B12  (Completed)    Folate RBC    RPR    Ambulatory referral to Neurology    Proximal muscle weakness        Relevant Orders    Ambulatory referral to Neurology      A total of 25 minutes of face to face time was spent with patient more than half of which was spent in counselling about the above mentioned conditions  and coordination of care  I am having Ms. Spurgeon maintain her buPROPion, ARIPiprazole, temazepam, clorazepate, Calcium Carbonate-Vitamin D (CALCIUM 600+D HIGH POTENCY PO), Levomilnacipran HCl ER,  Vitamin D3, and ergocalciferol.  No orders of the defined types were placed in this encounter.    There are no discontinued medications.  Follow-up: No Follow-up on file.   Crecencio Mc, MD

## 2015-08-29 NOTE — Telephone Encounter (Signed)
Ordered in Dimension 21. Thanks

## 2015-08-29 NOTE — Telephone Encounter (Signed)
Did she get her injection today ?  She can return to have it done,.

## 2015-08-30 LAB — RPR

## 2015-08-31 ENCOUNTER — Encounter: Payer: Self-pay | Admitting: Internal Medicine

## 2015-08-31 DIAGNOSIS — G252 Other specified forms of tremor: Secondary | ICD-10-CM | POA: Insufficient documentation

## 2015-08-31 NOTE — Assessment & Plan Note (Signed)
Aggravated by proximal muscle weakness.  History of GBS in 2005,  With progression of tremor in hands and feet over the last several months.  Nothing about her exam suggests Parkinson's Disease. But will need Neurology evaluation

## 2015-09-01 LAB — FOLATE RBC: RBC Folate: 605 ng/mL (ref >280)

## 2015-09-01 NOTE — Telephone Encounter (Signed)
Left a VM for patient to return a call to the office to schedule her injection.  Thanks

## 2015-09-02 ENCOUNTER — Encounter: Payer: Self-pay | Admitting: *Deleted

## 2015-09-03 ENCOUNTER — Encounter: Payer: Self-pay | Admitting: Neurology

## 2015-09-11 ENCOUNTER — Encounter: Payer: Self-pay | Admitting: Neurology

## 2015-09-11 ENCOUNTER — Other Ambulatory Visit: Payer: Medicare Other

## 2015-09-11 ENCOUNTER — Ambulatory Visit (INDEPENDENT_AMBULATORY_CARE_PROVIDER_SITE_OTHER): Payer: Medicare Other | Admitting: Neurology

## 2015-09-11 VITALS — BP 134/88 | HR 85 | Ht 66.0 in | Wt 180.0 lb

## 2015-09-11 DIAGNOSIS — G2111 Neuroleptic induced parkinsonism: Secondary | ICD-10-CM | POA: Diagnosis not present

## 2015-09-11 DIAGNOSIS — F331 Major depressive disorder, recurrent, moderate: Secondary | ICD-10-CM | POA: Diagnosis not present

## 2015-09-11 NOTE — Progress Notes (Signed)
Holly [name] was seen today in the movement disorders clinic for neurologic consultation at the request of TULLO, Aris Everts, MD.   The patient is seen today in neurologic consultation for tremor.  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  It is located in the bilateral upper extremity sampled bilateral lower extremity, but is most evident in the right arm (but also states that her GBS was in the right arm so maybe she notices things more here). She is right hand dominant.  She notices the tremor when using the mouse with doing presentations at work.  She has tremor with eating with fork.   Her brother has Parkinson's disease and she is worried about this.  Stress increases this sx.  Sometimes she feels that she has vocal tremor.  The patient is on Abilify and has been on this since about 2009 (in our system since at least 2014).  She sees Dr. Casimiro Needle  Specific Symptoms:  Voice: hypophonic speech x 3 months Sleep: trouble staying asleep  Vivid Dreams:  No.  Acting out dreams:  No. (but lives alone) Wet Pillows: Yes.   (sometimes) Postural symptoms:  Yes.    Falls?  Yes.   (just a few, attributes to "tripping" and wonders if dragging feet) Bradykinesia symptoms: shuffling gait and difficulty getting out of a chair Loss of smell:  No. Loss of taste:  No. Urinary Incontinence:  Yes.   (doesn't wear pad) Difficulty Swallowing:  No. Handwriting, micrographia: Yes.   Trouble with ADL's:  No.  Trouble buttoning clothing: No. Depression:  Yes.   (on medication for this and admits that increased stress in life; sees a therapist) Memory changes:  No. Hallucinations:  No.  visual distortions: No. N/V:  No. Lightheaded:  Yes.  , but rare  Syncope: No. Diplopia:  No. Dyskinesia:  No.  Neuroimaging has previously been performed.  It is not available for my review today.  States  that it was done in 2005 when she had GBS at [facility] and was told that she had white matter disease.  ALLERGIES:   Allergies  Allergen Reactions  . Cephalexin     Other reaction(s): HIVES    CURRENT MEDICATIONS:  Outpatient Encounter Prescriptions as of 09/11/2015  Medication Sig  . ARIPiprazole (ABILIFY) 5 MG tablet Take 2.5 mg by mouth daily.  Marland Kitchen buPROPion (WELLBUTRIN XL) 300 MG 24 hr tablet Take 300 mg by mouth daily.  . Calcium Carbonate-Vitamin D (CALCIUM 600+D HIGH POTENCY PO) Take 1 tablet by mouth daily.  . Cholecalciferol (VITAMIN D3) 2000 UNITS CHEW Chew 1 capsule by mouth daily. Reported on 08/29/2015  . clorazepate (TRANXENE) 7.5 MG tablet Take 7.5 mg by mouth 2 (two) times daily as needed for anxiety (!5 mg at bed time).  . Levomilnacipran HCl ER (FETZIMA) 20 MG CP24 Take 20 mg by mouth.  . temazepam (RESTORIL) 30 MG capsule Take 30 mg by mouth at bedtime as needed for sleep.  . [DISCONTINUED] ergocalciferol (DRISDOL) 50000 units capsule Take 1 capsule (50,000 Units total) by mouth once a week.  . [DISCONTINUED] temazepam (RESTORIL) 15 MG capsule Take 30 mg by mouth at bedtime as needed for sleep.   No facility-administered encounter medications on file as of 09/11/2015.    PAST MEDICAL HISTORY:   Past Medical History  Diagnosis Date  . Anxiety   .  Squamous cell skin cancer 2011  . Depression   . Guillain Barr syndrome ([place])   . Osteoporosis     PAST SURGICAL HISTORY:   Past Surgical History  Procedure Laterality Date  . Breast surgery  1980    biopsy  . Appendectomy  1960  . Abdominal hysterectomy  1989  . Wrist fracture surgery Right 2015    SOCIAL HISTORY:   Social History   Social History  . Marital Status: Widowed    Spouse Name: N/A  . Number of Children: N/A  . Years of Education: N/A   Occupational History  . Not on file.   Social History Main Topics  . Smoking status: Former Smoker -- 0.20 packs/day    Types: Cigarettes    Quit date:  06/14/1977  . Smokeless tobacco: Never Used  . Alcohol Use: 0.0 oz/week    0 Standard drinks or equivalent per week     Comment: social (about once a month)  . Drug Use: No  . Sexual Activity: No   Other Topics Concern  . Not on file   Social History Narrative    FAMILY HISTORY:   Family Status  Relation Status Death Age  . Mother Deceased     leukemia  . Father Deceased     fall  . Sister Alive     bipolar, cervical dystonia  . Brother Deceased     schizophrenia  . Sister Alive     healthy  . Brother Alive     PD  . Daughter Alive     chorea    ROS:  A complete 10 system review of systems was obtained and was unremarkable apart from what is mentioned above.  PHYSICAL EXAMINATION:    VITALS:   Filed Vitals:   09/11/15 0832  BP: 134/88  Pulse: 85  Height: 5\' 6"  (1.676 m)  Weight: 180 lb (81.647 kg)    GEN:  The patient appears stated age and is in NAD. HEENT:  Normocephalic, atraumatic.  The mucous membranes are moist. The superficial temporal arteries are without ropiness or tenderness. CV:  RRR Lungs:  CTAB Neck/HEME:  There are no carotid bruits bilaterally.  Neurological examination:  Orientation: The patient is alert and oriented x3. Fund of knowledge is appropriate.  Recent and remote memory are intact.  Attention and concentration are normal.    Able to name objects and repeat phrases. Cranial nerves: There is good facial symmetry. There is facial hypomimia.  Pupils are equal round and reactive to light bilaterally. Fundoscopic exam reveals clear margins bilaterally. Extraocular muscles are intact. The visual fields are full to confrontational testing. The speech is fluent and clear but it is hypophonic. Soft palate rises symmetrically and there is no tongue deviation. Hearing is intact to conversational tone. Sensation: Sensation is intact to light and pinprick throughout (facial, trunk, extremities). Vibration is intact at the bilateral big toe. There  is no extinction with double simultaneous stimulation. There is no sensory dermatomal level identified. Motor: Strength is 5/5 in the bilateral upper and lower extremities.   Shoulder shrug is equal and symmetric.  There is no pronator drift. Deep tendon reflexes: Deep tendon reflexes are 3/4 at the bilateral biceps, triceps, brachioradialis, patella and achilles. Plantar responses are downgoing bilaterally.  Movement examination: Tone: There is normal tone in the bilateral upper extremities.  The tone in the lower extremities is normal.  Abnormal movements: There is independent R and LLE tremor.  There is UE tremor bilaterally  and independent as well but not as significant as the LE. Coordination:  There is  decremation with RAM's, only with toe taps bilaterally and heel taps on the left.  All other RAMs are normal.   Gait and Station: The patient has difficulty arising out of a deep-seated chair without the use of the hands; it takes 3 attempts and then she is able to arise without the use of the hands. The patient's stride length is normal.  The patient has a negative pull test.      ASSESSMENT/PLAN:  Parkinsonism  -I had a long counseling session with the patient today.  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  I explained that one clinically cannot tell the difference between idiopathic parkinsons disease.  I also explained that even if one is able to get off of the medication, it can take up to 6 months to clinically definitively know if this is idiopathic parkinsons disease.  I did not advise that the patient go off of medication, as this needs to be discussed with the patients prescribing physician.  I did, however, tell the patient that the longer one is on the medication, the worse the symptoms can get.  The patient is to make an appointment with Dr. Casimiro Needle to discuss what I have discussed with her.  I would like to see her in 6 months.  She was  agreeable.  Much greater than 50% of this visit was spent in counseling and coordinating care.  Total face to face time:  60 min

## 2015-09-11 NOTE — Progress Notes (Signed)
Note sent to Dr Casimiro Needle.

## 2015-09-15 ENCOUNTER — Ambulatory Visit (INDEPENDENT_AMBULATORY_CARE_PROVIDER_SITE_OTHER): Payer: Medicare Other

## 2015-09-15 VITALS — BP 128/80 | HR 61 | Temp 97.1°F | Resp 14 | Ht 65.0 in | Wt 181.1 lb

## 2015-09-15 DIAGNOSIS — Z0000 Encounter for general adult medical examination without abnormal findings: Secondary | ICD-10-CM | POA: Diagnosis not present

## 2015-09-15 DIAGNOSIS — M810 Age-related osteoporosis without current pathological fracture: Secondary | ICD-10-CM

## 2015-09-15 MED ORDER — DENOSUMAB 60 MG/ML ~~LOC~~ SOLN
60.0000 mg | Freq: Once | SUBCUTANEOUS | Status: AC
  Administered 2015-09-15: 60 mg via SUBCUTANEOUS

## 2015-09-15 NOTE — Progress Notes (Signed)
Subjective:   Holly [name] is a 70 y.o. female who presents for an Initial Medicare Annual Wellness Visit.  Review of Systems    No ROS.  Medicare Wellness Visit.  Cardiac Risk Factors include: advanced age (>75men, >74 women)     Objective:    Today's Vitals   09/15/15 0835  BP: 128/80  Pulse: 61  Temp: 97.1 F (36.2 C)  TempSrc: Oral  Resp: 14  Height: 5\' 5"  (1.651 m)  Weight: 181 lb 1.9 oz (82.155 kg)  SpO2: 99%   Body mass index is 30.14 kg/(m^2).   Current Medications (verified) Outpatient Encounter Prescriptions as of 09/15/2015  Medication Sig  . ARIPiprazole (ABILIFY) 5 MG tablet Take 2.5 mg by mouth daily.  Marland Kitchen buPROPion (WELLBUTRIN XL) 300 MG 24 hr tablet Take 300 mg by mouth daily.  . Calcium Carbonate-Vitamin D (CALCIUM 600+D HIGH POTENCY PO) Take 1 tablet by mouth daily.  . Cholecalciferol (VITAMIN D3) 2000 UNITS CHEW Chew 1 capsule by mouth daily. Reported on 08/29/2015  . clorazepate (TRANXENE) 7.5 MG tablet Take 7.5 mg by mouth 2 (two) times daily as needed for anxiety (!5 mg at bed time).  . Levomilnacipran HCl ER (FETZIMA) 20 MG CP24 Take 20 mg by mouth.  . temazepam (RESTORIL) 30 MG capsule Take 30 mg by mouth at bedtime as needed for sleep.  . [EXPIRED] denosumab (PROLIA) injection 60 mg    No facility-administered encounter medications on file as of 09/15/2015.    Allergies (verified) Cephalexin   History: Past Medical History  Diagnosis Date  . Anxiety   . Squamous cell skin cancer 2011  . Depression   . Guillain Barr syndrome ([place]) 2005  . Osteoporosis    Past Surgical History  Procedure Laterality Date  . Breast surgery  1980    biopsy  . Appendectomy  1960  . Abdominal hysterectomy  1989  . Wrist fracture surgery Right 2015   Family History  Problem Relation Age of Onset  . Cancer Mother   . Mental illness Sister   . Bipolar disorder Sister   . Depression Brother   . Mental illness Brother   . Parkinson's disease Brother     Social History   Occupational History  . Not on file.   Social History Main Topics  . Smoking status: Former Smoker -- 0.20 packs/day    Types: Cigarettes    Quit date: 06/14/1977  . Smokeless tobacco: Never Used  . Alcohol Use: 0.0 oz/week    0 Standard drinks or equivalent per week     Comment: social (about once a month)  . Drug Use: No  . Sexual Activity: No    Tobacco Counseling Counseling given: Not Answered   Activities of Daily Living In your present state of health, do you have any difficulty performing the following activities: 09/15/2015  Hearing? N  Vision? N  Difficulty concentrating or making decisions? Y  Walking or climbing stairs? Y  Dressing or bathing? N  Doing errands, shopping? N  Preparing Food and eating ? Y  Using the Toilet? N  In the past six months, have you accidently leaked urine? Y  Do you have problems with loss of bowel control? N  Managing your Medications? N  Managing your Finances? N  Housekeeping or managing your Housekeeping? N    Immunizations and Health Maintenance Immunization History  Administered Date(s) Administered  . Td 06/14/2006   Health Maintenance Due  Topic Date Due  . ZOSTAVAX  01/26/2008  . MAMMOGRAM  05/08/2013    Patient Care Team: Crecencio Mc, MD as PCP - General (Internal Medicine)  Indicate any recent Medical Services you may have received from other than [facility] providers in the past year (date may be approximate).     Assessment:   This is a routine wellness examination for Holly [name]. The goal of the wellness visit is to assist the patient how to close the gaps in care and create a preventative care plan for the patient.   Osteoporosis reviewed; Taking VIT D3/VIT D Calcium as appropriate.  PROLIA injection administered; L arm subcutaneous, tolerated well. Education provided for next injection follow up.  Medications reviewed; taking without issues or barriers.  Depression; reports she is still  having some bad days, but the current dose of anti-depressive medications are helping to keep her balanced.  Currently undergoing psychiatric counseling.   Safety issues reviewed; smoke detectors in the home. No firearms in the home. Wears seatbelts when driving or riding with others. No violence in the home.  No identified risk were noted; The patient was oriented x 3; appropriate in dress and manner and no objective failures at ADL's or IADL's.   ZOSTAVAX vaccine postponed, per patient request.  Educational material provided.  Follow up with PCP.  Major depressive disorder-stable and followed by psychiatrist and PCP. Recurr depr psychos-mod-stable and followed by psychiatrist and PCP.  Patient Concerns:  None at this time.  Follow up with PCP as needed.    Hearing/Vision screen Hearing Screening Comments: Passes the whisper test Vision Screening Comments: [facility] Wears glasses Last OV 2012  Dietary issues and exercise activities discussed: Current Exercise Habits: The patient does not participate in regular exercise at present  Goals    . Healthy Lifestyle     Stay hydrated and drink plenty of fluids.  Increase water intake by 1 bottle (=2 cups). Low carb foods. Lean meats, fruits and vegetables. Chair exercises as demonstrated, as tolerated.  Education provided. Start walking 1 block in the neighborhood, as tolerated.  Stay active.       Depression Screen PHQ 2/9 Scores 09/15/2015 06/30/2015  PHQ - 2 Score 3 5  PHQ- 9 Score 11 17    Fall Risk Fall Risk  09/15/2015 06/30/2015 03/16/2013  Falls in the past year? No Yes Yes  Number falls in past yr: - 1 1  Injury with Fall? - No Yes  Risk Factor Category  - - High Fall Risk  Risk for fall due to : - Other (Comment);Impaired mobility;Impaired vision History of fall(s);Impaired mobility  Risk for fall due to (comments): - tripped and fell depression medications -  Follow up - Falls prevention discussed -     Cognitive Function: MMSE - Mini Mental State Exam 09/15/2015  Orientation to time 5  Orientation to Place 5  Registration 3  Attention/ Calculation 5  Recall 3  Language- name 2 objects 2  Language- repeat 1  Language- follow 3 step command 3  Language- read & follow direction 1  Write a sentence 1  Copy design 1  Total score 30    Screening Tests Health Maintenance  Topic Date Due  . ZOSTAVAX  01/26/2008  . MAMMOGRAM  05/08/2013  . TETANUS/TDAP  06/14/2016  . Fecal DNA (Cologuard)  07/16/2018  . DEXA SCAN  Completed  . Hepatitis C Screening  Completed      Plan:   End of life planning; Advance aging; Advanced directives discussed. Copy of  current HCPOA/Living Will requested.    During the course of the visit, Holly [name] was educated and counseled about the following appropriate screening and preventive services:   Vaccines to include Pneumoccal, Influenza, Hepatitis B, Td, Zostavax, HCV  Electrocardiogram  Cardiovascular disease screening  Colorectal cancer screening  Bone density screening  Diabetes screening  Glaucoma screening  Mammography/PAP  Nutrition counseling  Smoking cessation counseling  Patient Instructions (the written plan) were given to the patient.    Varney Biles, LPN   [id number]

## 2015-09-15 NOTE — Patient Instructions (Addendum)
[name] , Thank you for taking time to come for your Medicare Wellness Visit. I appreciate your ongoing commitment to your health goals. Please review the following plan we discussed and let me know if I can assist you in the future.   POISE pads for leaking/accidents.  Make eye exam.  Check on Mammogram appointment.  Call in September to order next PROLIA injection due in October.  Follow up with Dr.Tullo as needed.   This is a list of the screening recommended for you and due dates:  Health Maintenance  Topic Date Due  . Shingles Vaccine  01/26/2008  . Mammogram  05/08/2013  . Tetanus Vaccine  06/14/2016  . Cologuard (Stool DNA test)  07/16/2018  . DEXA scan (bone density measurement)  Completed  .  Hepatitis C: One time screening is recommended by Center for Disease Control  (CDC) for  adults born from [birth year] through 1963.   Completed    Bone Densitometry Bone densitometry is an imaging test that uses a special X-ray to measure the amount of calcium and other minerals in your bones (bone density). This test is also known as a bone mineral density test or dual-energy X-ray absorptiometry (DXA). The test can measure bone density at your hip and your spine. It is similar to having a regular X-ray. You may have this test to:  Diagnose a condition that causes weak or thin bones (osteoporosis).  Predict your risk of a broken bone (fracture).  Determine how well osteoporosis treatment is working. LET [facility] CARE PROVIDER KNOW ABOUT:  Any allergies you have.  All medicines you are taking, including vitamins, herbs, eye drops, creams, and over-the-counter medicines.  Previous problems you or members of your family have had with the use of anesthetics.  Any blood disorders you have.  Previous surgeries you have had.  Medical conditions you have.  Possibility of pregnancy.  Any other medical test you had within the previous 14 days that used contrast material. RISKS  AND COMPLICATIONS Generally, this is a safe procedure. However, problems can occur and may include the following:  This test exposes you to a very small amount of radiation.  The risks of radiation exposure may be greater to unborn children. BEFORE THE PROCEDURE  Do not take any calcium supplements for 24 hours before having the test. You can otherwise eat and drink what you usually do.  Take off all metal jewelry, eyeglasses, dental appliances, and any other metal objects. PROCEDURE  You may lie on an exam table. There will be an X-ray generator below you and an imaging device above you.  Other devices, such as boxes or braces, may be used to position your body properly for the scan.  You will need to lie still while the machine slowly scans your body.  The images will show up on a computer monitor. AFTER THE PROCEDURE You may need more testing at a later time.   This information is not intended to replace advice given to you by your health care provider. Make sure you discuss any questions you have with your health care provider.   Document Released: 06/22/2004 Document Revised: 06/21/2014 Document Reviewed: 11/08/2013 Elsevier Interactive Patient Education 2016 [facility] in the Home  Falls can cause injuries. They can happen to people of all ages. There are many things you can do to make your home safe and to help prevent falls.  WHAT CAN I DO ON THE OUTSIDE OF MY HOME?  Regularly fix the edges of walkways and driveways and fix any cracks.  Remove anything that might make you trip as you walk through a door, such as a raised step or threshold.  Trim any bushes or trees on the path to your home.  Use bright outdoor lighting.  Clear any walking paths of anything that might make someone trip, such as rocks or tools.  Regularly check to see if handrails are loose or broken. Make sure that both sides of any steps have handrails.  Any raised decks and  porches should have guardrails on the edges.  Have any leaves, snow, or ice cleared regularly.  Use sand or salt on walking paths during winter.  Clean up any spills in your garage right away. This includes oil or grease spills. WHAT CAN I DO IN THE BATHROOM?   Use night lights.  Install grab bars by the toilet and in the tub and shower. Do not use towel bars as grab bars.  Use non-skid mats or decals in the tub or shower.  If you need to sit down in the shower, use a plastic, non-slip stool.  Keep the floor dry. Clean up any water that spills on the floor as soon as it happens.  Remove soap buildup in the tub or shower regularly.  Attach bath mats securely with double-sided non-slip rug tape.  Do not have throw rugs and other things on the floor that can make you trip. WHAT CAN I DO IN THE BEDROOM?  Use night lights.  Make sure that you have a light by your bed that is easy to reach.  Do not use any sheets or blankets that are too big for your bed. They should not hang down onto the floor.  Have a firm chair that has side arms. You can use this for support while you get dressed.  Do not have throw rugs and other things on the floor that can make you trip. WHAT CAN I DO IN THE KITCHEN?  Clean up any spills right away.  Avoid walking on wet floors.  Keep items that you use a lot in easy-to-reach places.  If you need to reach something above you, use a strong step stool that has a grab bar.  Keep electrical cords out of the way.  Do not use floor polish or wax that makes floors slippery. If you must use wax, use non-skid floor wax.  Do not have throw rugs and other things on the floor that can make you trip. WHAT CAN I DO WITH MY STAIRS?  Do not leave any items on the stairs.  Make sure that there are handrails on both sides of the stairs and use them. Fix handrails that are broken or loose. Make sure that handrails are as long as the stairways.  Check any  carpeting to make sure that it is firmly attached to the stairs. Fix any carpet that is loose or worn.  Avoid having throw rugs at the top or bottom of the stairs. If you do have throw rugs, attach them to the floor with carpet tape.  Make sure that you have a light switch at the top of the stairs and the bottom of the stairs. If you do not have them, ask someone to add them for you. WHAT ELSE CAN I DO TO HELP PREVENT FALLS?  Wear shoes that:  Do not have high heels.  Have rubber bottoms.  Are comfortable and fit you well.  Are closed  at the toe. Do not wear sandals.  If you use a stepladder:  Make sure that it is fully opened. Do not climb a closed stepladder.  Make sure that both sides of the stepladder are locked into place.  Ask someone to hold it for you, if possible.  Clearly mark and make sure that you can see:  Any grab bars or handrails.  First and last steps.  Where the edge of each step is.  Use tools that help you move around (mobility aids) if they are needed. These include:  Canes.  Walkers.  Scooters.  Crutches.  Turn on the lights when you go into a dark area. Replace any light bulbs as soon as they burn out.  Set up your furniture so you have a clear path. Avoid moving your furniture around.  If any of your floors are uneven, fix them.  If there are any pets around you, be aware of where they are.  Review your medicines with your doctor. Some medicines can make you feel dizzy. This can increase your chance of falling. Ask your doctor what other things that you can do to help prevent falls.   This information is not intended to replace advice given to you by your health care provider. Make sure you discuss any questions you have with your health care provider.   Document Released: 03/27/2009 Document Revised: 10/15/2014 Document Reviewed: 07/05/2014 Elsevier Interactive Patient Education [employer].

## 2015-09-15 NOTE — Progress Notes (Signed)
  I have reviewed the above information and agree with above.   Teresa Tullo, MD 

## 2015-09-18 NOTE — Progress Notes (Signed)
  I have reviewed the above information and agree with above.   Winnie Umali, MD 

## 2015-09-22 DIAGNOSIS — F3342 Major depressive disorder, recurrent, in full remission: Secondary | ICD-10-CM | POA: Diagnosis not present

## 2015-09-23 ENCOUNTER — Telehealth: Payer: Self-pay | Admitting: Neurology

## 2015-09-23 NOTE — Telephone Encounter (Signed)
Spoke with Dr. Casimiro Needle.  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  Described what I had seen.  Stated that he had tried to take her off of abilify in the past and pt suicidal.  He asked if I thought that pt had TD but I did not and neither did he.  He said that he may try seroquel or cogentin.

## 2015-09-23 NOTE — Telephone Encounter (Signed)
Dr. Casimiro Needle called after office hours last night and left message asking for call back today.  I called him back and had to leave message.  He also asked for office notes.  We had previously sent and will send again.

## 2015-09-28 ENCOUNTER — Other Ambulatory Visit: Payer: Self-pay | Admitting: Internal Medicine

## 2015-11-24 DIAGNOSIS — F3342 Major depressive disorder, recurrent, in full remission: Secondary | ICD-10-CM | POA: Diagnosis not present

## 2015-12-23 DIAGNOSIS — F3342 Major depressive disorder, recurrent, in full remission: Secondary | ICD-10-CM | POA: Diagnosis not present

## 2016-02-03 DIAGNOSIS — F3342 Major depressive disorder, recurrent, in full remission: Secondary | ICD-10-CM | POA: Diagnosis not present

## 2016-03-19 ENCOUNTER — Ambulatory Visit: Payer: Self-pay | Admitting: Neurology

## 2016-03-24 ENCOUNTER — Telehealth: Payer: Self-pay | Admitting: Internal Medicine

## 2016-03-24 DIAGNOSIS — F3342 Major depressive disorder, recurrent, in full remission: Secondary | ICD-10-CM | POA: Diagnosis not present

## 2016-03-24 NOTE — Telephone Encounter (Signed)
Pt called about needing to her Prolia approved. Pt is ready to get the Prolia. Thank you!  Call pt@ [phone].

## 2016-03-25 NOTE — Progress Notes (Signed)
Holly [name] was seen today in the movement disorders clinic for neurologic consultation at the request of TULLO, Aris Everts, MD.   The patient is seen today in neurologic consultation for tremor.  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  It is located in the bilateral upper extremity sampled bilateral lower extremity, but is most evident in the right arm (but also states that her GBS was in the right arm so maybe she notices things more here). She is right hand dominant.  She notices the tremor when using the mouse with doing presentations at work.  She has tremor with eating with fork.   Her brother has Parkinson's disease and she is worried about this.  Stress increases this sx.  Sometimes she feels that she has vocal tremor.  The patient is on Abilify and has been on this since about 2009 (in our system since at least 2014).  She sees Dr. Casimiro Needle  Specific Symptoms:  Voice: hypophonic speech x 3 months Sleep: trouble staying asleep  Vivid Dreams:  No.  Acting out dreams:  No. (but lives alone) Wet Pillows: Yes.   (sometimes) Postural symptoms:  Yes.    Falls?  Yes.   (just a few, attributes to "tripping" and wonders if dragging feet) Bradykinesia symptoms: shuffling gait and difficulty getting out of a chair Loss of smell:  No. Loss of taste:  No. Urinary Incontinence:  Yes.   (doesn't wear pad) Difficulty Swallowing:  No. Handwriting, micrographia: Yes.   Trouble with ADL's:  No.  Trouble buttoning clothing: No. Depression:  Yes.   (on medication for this and admits that increased stress in life; sees a therapist) Memory changes:  No. Hallucinations:  No.  visual distortions: No. N/V:  No. Lightheaded:  Yes.  , but rare  Syncope: No. Diplopia:  No. Dyskinesia:  No.  Neuroimaging has previously been performed.  It is not available for my review today.  States  that it was done in 2005 when she had GBS at [facility] and was told that she had white matter disease.  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  The patient has been following with Dr. Casimiro Needle and he ultimately d/c the abilify and pt states that she started taking the cogentin in addition to abilify.  She did try to d/c the cogentin because of dry mouth but she had increased tremor.  She went back on it.  She is only taking it at bedtime.  No longer having the dry mouth and her boss tells her that her writing is legible again.  No falls but one near fall after tripping.  She notes that she is still shuffling.    ALLERGIES:   Allergies  Allergen Reactions  . Cephalexin     Other reaction(s): HIVES    CURRENT MEDICATIONS:  Outpatient Encounter Prescriptions as of 03/26/2016  Medication Sig  . ARIPiprazole (ABILIFY) 5 MG tablet Take 2.5 mg by mouth daily.  . benztropine (COGENTIN) 1 MG tablet   . Calcium Carbonate-Vitamin D (CALCIUM 600+D HIGH POTENCY PO) Take 1 tablet by mouth daily.  . Cholecalciferol (VITAMIN D3) 2000 UNITS CHEW Chew 1 capsule by mouth daily. Reported on 08/29/2015  . clorazepate (TRANXENE) 7.5 MG tablet Take by mouth. One in the morning, two at night  . FLUoxetine (PROZAC) 20 MG tablet Take  20 mg by mouth daily.   . Levomilnacipran HCl ER (FETZIMA) 20 MG CP24 Take 20 mg by mouth.  . temazepam (RESTORIL) 30 MG capsule Take 30 mg by mouth at bedtime as needed for sleep.  . [DISCONTINUED] buPROPion (WELLBUTRIN XL) 300 MG 24 hr tablet Take 300 mg by mouth daily.   No facility-administered encounter medications on file as of 03/26/2016.     PAST MEDICAL HISTORY:   Past Medical History:  Diagnosis Date  . Anxiety   . Depression   . Guillain Barr syndrome ([place]) 2005  . Osteoporosis   . Squamous cell skin cancer 2011    PAST SURGICAL HISTORY:   Past Surgical History:  Procedure Laterality Date  . ABDOMINAL HYSTERECTOMY  1989  .  APPENDECTOMY  1960  . BREAST SURGERY  1980   biopsy  . WRIST FRACTURE SURGERY Right 2015    SOCIAL HISTORY:   Social History   Social History  . Marital status: Widowed    Spouse name: N/A  . Number of children: N/A  . Years of education: N/A   Occupational History  . Not on file.   Social History Main Topics  . Smoking status: Former Smoker    Packs/day: 0.20    Types: Cigarettes    Quit date: 06/14/1977  . Smokeless tobacco: Never Used  . Alcohol use 0.0 oz/week     Comment: social (about once a month)  . Drug use: No  . Sexual activity: No   Other Topics Concern  . Not on file   Social History Narrative  . No narrative on file    FAMILY HISTORY:   Family Status  Relation Status  . Mother Deceased   leukemia  . Sister Alive   bipolar, cervical dystonia  . Brother Deceased   schizophrenia  . Father Deceased   fall, MI  . Sister Alive   healthy  . Brother Alive   PD  . Daughter Alive   chorea    ROS:  A complete 10 system review of systems was obtained and was unremarkable apart from what is mentioned above.  PHYSICAL EXAMINATION:    VITALS:   Vitals:   03/26/16 1410  BP: 104/64  Pulse: 62  Weight: 187 lb (84.8 kg)  Height: 5' 5.75" (1.67 m)    GEN:  The patient appears stated age and is in NAD. HEENT:  Normocephalic, atraumatic.  The mucous membranes are moist. The superficial temporal arteries are without ropiness or tenderness. CV:  RRR Lungs:  CTAB Neck/HEME:  There are no carotid bruits bilaterally.  Neurological examination:  Orientation: The patient is alert and oriented x3.  Cranial nerves: There is good facial symmetry. There is facial hypomimia.  Extraocular muscles are intact. The visual fields are full to confrontational testing. The speech is fluent and clear but it is hypophonic. Soft palate rises symmetrically and there is no tongue deviation. Hearing is intact to conversational tone. Sensation: Sensation is intact to light  touch throughout Motor: Strength is 5/5 in the bilateral upper and lower extremities.   Shoulder shrug is equal and symmetric.  There is no pronator drift.  Movement examination: Tone: There is normal tone in the bilateral upper extremities.  The tone in the lower extremities is normal.  Abnormal movements: There is rare tremor with action in both arms. Coordination:  There is  decremation with RAM's, only with toe taps bilaterally and heel taps on the left.  All other RAMs are normal.  Gait and Station: The patient arises out of the chair without the use of her hands on first attempt.  The patient's stride length is normal.  The patient has a negative pull test.      ASSESSMENT/PLAN:  Parkinsonism  -I had a long counseling session with the patient today.  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  Her psychiatrist started her on Cogentin along with the Abilify, which has helped some of the clinical symptoms.  She asked me about side effects of Cogentin, and we discussed its anticholinergic properties/side effects.  I explained again that one clinically cannot tell the difference between idiopathic parkinsons disease and parkinsonism from Abilify.  For now, she feels that she is doing well with Cogentin and will follow-up with me on an as-needed basis.

## 2016-03-26 ENCOUNTER — Ambulatory Visit (INDEPENDENT_AMBULATORY_CARE_PROVIDER_SITE_OTHER): Payer: Medicare Other | Admitting: Neurology

## 2016-03-26 ENCOUNTER — Encounter: Payer: Self-pay | Admitting: Neurology

## 2016-03-26 VITALS — BP 104/64 | HR 62 | Ht 65.75 in | Wt 187.0 lb

## 2016-03-26 DIAGNOSIS — F331 Major depressive disorder, recurrent, moderate: Secondary | ICD-10-CM

## 2016-03-26 DIAGNOSIS — G2111 Neuroleptic induced parkinsonism: Secondary | ICD-10-CM

## 2016-03-29 NOTE — Telephone Encounter (Signed)
Verification sent awaiting response

## 2016-04-01 NOTE — Telephone Encounter (Signed)
Left a message for patient to call me back, I have her verification. thanks

## 2016-04-19 NOTE — Telephone Encounter (Signed)
Left another message to schedule injection. thanks

## 2016-04-23 ENCOUNTER — Ambulatory Visit (INDEPENDENT_AMBULATORY_CARE_PROVIDER_SITE_OTHER): Payer: Medicare Other

## 2016-04-23 DIAGNOSIS — M810 Age-related osteoporosis without current pathological fracture: Secondary | ICD-10-CM | POA: Diagnosis not present

## 2016-04-23 MED ORDER — DENOSUMAB 60 MG/ML ~~LOC~~ SOLN
60.0000 mg | Freq: Once | SUBCUTANEOUS | Status: AC
  Administered 2016-04-23: 60 mg via SUBCUTANEOUS

## 2016-04-23 NOTE — Progress Notes (Signed)
Patient came in for Prolia injection, received in left arm, patient tolerated well.

## 2016-04-25 NOTE — Progress Notes (Signed)
  I have reviewed the above information and agree with above.   Suprena Travaglini, MD 

## 2016-09-14 ENCOUNTER — Ambulatory Visit: Payer: Self-pay

## 2016-10-27 ENCOUNTER — Ambulatory Visit (INDEPENDENT_AMBULATORY_CARE_PROVIDER_SITE_OTHER): Payer: Medicare Other

## 2016-10-27 DIAGNOSIS — M810 Age-related osteoporosis without current pathological fracture: Secondary | ICD-10-CM | POA: Diagnosis not present

## 2016-10-27 MED ORDER — DENOSUMAB 60 MG/ML ~~LOC~~ SOLN
60.0000 mg | Freq: Once | SUBCUTANEOUS | Status: AC
  Administered 2016-10-27: 60 mg via SUBCUTANEOUS

## 2016-10-27 NOTE — Progress Notes (Signed)
Patient in for prolia injection.  Received in  Left arm [place].  Patient tolerated well.

## 2016-10-27 NOTE — Progress Notes (Signed)
  I have reviewed the above information and agree with above.   Kolby Schara, MD 

## 2016-10-28 ENCOUNTER — Ambulatory Visit: Payer: Self-pay

## 2017-03-26 ENCOUNTER — Encounter: Payer: Self-pay | Admitting: Emergency Medicine

## 2017-03-26 ENCOUNTER — Emergency Department: Payer: PRIVATE HEALTH INSURANCE

## 2017-03-26 ENCOUNTER — Emergency Department
Admission: EM | Admit: 2017-03-26 | Discharge: 2017-03-26 | Disposition: A | Discharge status: Home / Self Care | Payer: PRIVATE HEALTH INSURANCE | Attending: Emergency Medicine | Admitting: Emergency Medicine

## 2017-03-26 DIAGNOSIS — S42201A Unspecified fracture of upper end of right humerus, initial encounter for closed fracture: Secondary | ICD-10-CM | POA: Diagnosis not present

## 2017-03-26 DIAGNOSIS — Z85828 Personal history of other malignant neoplasm of skin: Secondary | ICD-10-CM | POA: Insufficient documentation

## 2017-03-26 DIAGNOSIS — Z79899 Other long term (current) drug therapy: Secondary | ICD-10-CM | POA: Diagnosis not present

## 2017-03-26 DIAGNOSIS — W010XXA Fall on same level from slipping, tripping and stumbling without subsequent striking against object, initial encounter: Secondary | ICD-10-CM | POA: Diagnosis not present

## 2017-03-26 DIAGNOSIS — G610 Guillain-Barre syndrome: Secondary | ICD-10-CM | POA: Diagnosis not present

## 2017-03-26 DIAGNOSIS — Y929 Unspecified place or not applicable: Secondary | ICD-10-CM | POA: Insufficient documentation

## 2017-03-26 DIAGNOSIS — S01511A Laceration without foreign body of lip, initial encounter: Secondary | ICD-10-CM | POA: Insufficient documentation

## 2017-03-26 DIAGNOSIS — S0181XA Laceration without foreign body of other part of head, initial encounter: Secondary | ICD-10-CM

## 2017-03-26 DIAGNOSIS — Y939 Activity, unspecified: Secondary | ICD-10-CM | POA: Diagnosis not present

## 2017-03-26 DIAGNOSIS — Y999 Unspecified external cause status: Secondary | ICD-10-CM | POA: Diagnosis not present

## 2017-03-26 DIAGNOSIS — S060X1A Concussion with loss of consciousness of 30 minutes or less, initial encounter: Secondary | ICD-10-CM | POA: Insufficient documentation

## 2017-03-26 DIAGNOSIS — S0990XA Unspecified injury of head, initial encounter: Secondary | ICD-10-CM | POA: Diagnosis present

## 2017-03-26 DIAGNOSIS — Z87891 Personal history of nicotine dependence: Secondary | ICD-10-CM | POA: Insufficient documentation

## 2017-03-26 DIAGNOSIS — S022XXA Fracture of nasal bones, initial encounter for closed fracture: Secondary | ICD-10-CM | POA: Diagnosis not present

## 2017-03-26 MED ORDER — OXYCODONE-ACETAMINOPHEN 5-325 MG PO TABS: 1.0000 | ORAL_TABLET | Freq: Four times a day (QID) | ORAL | 0 refills | Status: DC | PRN

## 2017-03-26 MED ORDER — LIDOCAINE-EPINEPHRINE 2 %-1:100000 IJ SOLN
30.0000 mL | Freq: Once | INTRAMUSCULAR | Status: AC
  Administered 2017-03-26: 30 mL via INTRADERMAL
  Filled 2017-03-26: qty 30

## 2017-03-26 MED ORDER — TETANUS-DIPHTH-ACELL PERTUSSIS 5-2.5-18.5 LF-MCG/0.5 IM SUSP
0.5000 mL | Freq: Once | INTRAMUSCULAR | Status: DC
  Filled 2017-03-26: qty 0.5

## 2017-03-26 MED ORDER — OXYCODONE-ACETAMINOPHEN 5-325 MG PO TABS
1.0000 | ORAL_TABLET | Freq: Once | ORAL | Status: AC
  Administered 2017-03-26: 1 via ORAL
  Filled 2017-03-26: qty 1

## 2017-03-26 NOTE — ED Provider Notes (Signed)
[facility] Emergency Department Provider Note  ____________________________________________   First MD Initiated Contact with Patient 03/26/17 [phone]     (approximate)  I have reviewed the triage vital signs and the nursing notes.   HISTORY  Chief Complaint Head Injury   HPI Holly [name] is a 71 y.o. female with a history of depression as well as Guillan Barr syndrome who is presenting to the emergency department after mechanical fall. She says that she was taken off guard by a ramp that she did not see and lost her footing. She fell onto her right shoulderas well as the right side of her face. She thinks that she lost consciousness for several seconds and is now having pain to the right side of her forehead as well as her face. She is also reporting pain in her nose was bleeding out of the left nostril. She also notes a laceration to her right lower lip that has been bleeding intermittently. She is denying pain in her neck. Says that the pain to her right shoulder is anterior and she is having difficulty moving her right shoulder. Denies being on any blood thinners. Believe shecontracted Raynald Blend after getting a flu vaccine.   Past Medical History:  Diagnosis Date  . Anxiety   . Depression   . Guillain Barr syndrome ([place]) 2005  . Osteoporosis   . Squamous cell skin cancer 2011    Patient Active Problem List   Diagnosis Date Noted  . 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 Encounter for screening colonoscopy 03/16/2013  . Routine general medical examination at a health care facility 03/16/2013  . Right leg weakness 01/23/2013  . History of Guillain-Barre syndrome 01/23/2013  . Radial fracture 01/23/2013  . 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 UNSPECIFIED VITAMIN D DEFICIENCY 08/26/2008  . UNSPECIFIED ANEMIA 08/26/2008  . Recurrent depressive disorder, current episode moderate ([place]) 07/29/2008  . Fatigue due to depression 07/29/2008  . WEIGHT  LOSS 07/29/2008    Past Surgical History:  Procedure Laterality Date  . ABDOMINAL HYSTERECTOMY  1989  . APPENDECTOMY  1960  . BREAST SURGERY  1980   biopsy  . WRIST FRACTURE SURGERY Right 2015    Prior to Admission medications   Medication Sig Start Date End Date Taking? Authorizing Provider  ARIPiprazole (ABILIFY) 5 MG tablet Take 2.5 mg by mouth daily.    [provider]  benztropine (COGENTIN) 1 MG tablet  03/24/16   [provider]  Calcium Carbonate-Vitamin D (CALCIUM 600+D HIGH POTENCY PO) Take 1 tablet by mouth daily.    [provider]  Cholecalciferol (VITAMIN D3) 2000 UNITS CHEW Chew 1 capsule by mouth daily. Reported on 08/29/2015    [provider]  clorazepate (TRANXENE) 7.5 MG tablet Take by mouth. One in the morning, two at night    [provider]  FLUoxetine (PROZAC) 20 MG tablet Take 20 mg by mouth daily.  03/24/16   [provider]  Levomilnacipran HCl ER (FETZIMA) 20 MG CP24 Take 20 mg by mouth.    [provider]  temazepam (RESTORIL) 30 MG capsule Take 30 mg by mouth at bedtime as needed for sleep.    [provider]    Allergies Cephalexin  Family History  Problem Relation Age of Onset  . Cancer Mother   . Mental illness Sister   . Bipolar disorder Sister   . Depression Brother   . Mental illness Brother   . Parkinson's disease Brother  Social History Social History  Substance Use Topics  . Smoking status: Former Smoker    Packs/day: 0.20    Types: Cigarettes    Quit date: 06/14/1977  . Smokeless tobacco: Never Used  . Alcohol use 0.0 oz/week     Comment: social (about once a month)    Review of Systems  Constitutional: No fever/chills Eyes: No visual changes. ENT: No sore throat. Cardiovascular: Denies chest pain. Respiratory: Denies shortness of breath. Gastrointestinal: No abdominal pain.  No nausea, no vomiting.  No diarrhea.  No constipation. Genitourinary:  Negative for dysuria. Musculoskeletal: Negative for back pain. Skin: Negative for rash. Neurological: Negative for focal weakness or numbness.   ____________________________________________   PHYSICAL EXAM:  VITAL SIGNS: ED Triage Vitals  Enc Vitals Group     BP 03/26/17 0933 (!) 195/97     Pulse Rate 03/26/17 0933 87     Resp 03/26/17 0933 20     Temp 03/26/17 0933 98.1 F (36.7 C)     Temp Source 03/26/17 0933 Oral     SpO2 03/26/17 0933 99 %     Weight 03/26/17 0934 156 lb (70.8 kg)     Height 03/26/17 0934 5\' 6"  (1.676 m)     Head Circumference --      Peak Flow --      Pain Score 03/26/17 0933 4     Pain Loc --      Pain Edu? --      Excl. in [place]? --     Constitutional: Alert and oriented. in no acute distress. Eyes: Conjunctivae are normal.  Head: Atraumatic.no ecchymosis to the cranium. No tenderness nor depression. Nose: crusted blood in the left nares. No septal hematoma palpated bilaterally. Swelling of the nasal bridge with tenderness superiorly. Mouth/Throat: Mucous membranes are moist. right lower lip with  a laceration not involving the vermilion border that is horizontal. No active bleeding at this time. Neck: No stridor.  no tenderness to palpation over the cervical spine. No deformity or step-off. Cardiovascular: Normal rate, regular rhythm. Grossly normal heart sounds.   Respiratory: Normal respiratory effort.  No retractions. Lungs CTAB. Gastrointestinal: Soft and nontender. No distention. No CVA tenderness. Musculoskeletal: No lower extremity tenderness nor edema.  No joint effusions.pelvis is stable and nontender bilaterally. 5 out of 5 strength bilateral lower extremities.  Right shoulder with tenderness anteriorly to the proximal humerus and coracoid process. However, there is no swelling, asymmetry or deformity. Right upper extremity is neurovascularly intact.  Neurologic:  Normal speech and language. No gross focal neurologic deficits are  appreciated. Skin:  once I made a laceration to the skin just superior to the chin but below the vermilion border of the lip on the right. No active bleeding. Possible through and through laceration from the lip on outward. Psychiatric: Mood and affect are normal. Speech and behavior are normal.  ____________________________________________   LABS (all labs ordered are listed, but only abnormal results are displayed)  Labs Reviewed - No data to display ____________________________________________  EKG   ____________________________________________  RADIOLOGY  acute undisplaced right greater tuberosity fracture of the right humerus. Nasal bone fracture bilaterally. No intercranial injury. ____________________________________________   PROCEDURES  Procedure(s) performed:   LACERATION REPAIR Performed by: Doran Stabler Authorized by: Doran Stabler Consent: Verbal consent obtained. Risks and benefits: risks, benefits and alternatives were discussed Consent given by: patient Patient identity confirmed: provided demographic data Prepped and Draped in normal sterile fashion Wound explored  Laceration Location:  lip, face  Laceration Length: 1.5cm lip,  1 cm facial  No Foreign Bodies seen or palpated  Anesthesia: local infiltration  Local anesthetic: lidocaine 1% with epinephrine  Anesthetic total: 3 ml  Irrigation method: syringe Amount of cleaning: standard  Skin closure: subdermal, 1 suture to the facial lac with good approx.  Covered with small amount of dermabond.  2 [place] sutures to the lip lac  5-0 vicryl  Number of sutures: 3  Technique: simple, interrupted.   Patient tolerance: Patient tolerated the procedure well with no immediate complications.   Procedures  Critical Care performed:   ____________________________________________   INITIAL IMPRESSION / ASSESSMENT AND PLAN / ED COURSE  Pertinent labs & imaging results that were  available during my care of the patient were reviewed by me and considered in my medical decision making (see chart for details).  DDX: Mechanical fall, concussion, lip laceration, nasal fracture, shoulder fracture, shoulder strain  As part of my medical decision making, I reviewed the following data within the [facility] chart reviewed  We will hold tetanus vaccination per patient request because of previous history of guillan barre.     ----------------------------------------- 12:08 PM on 03/26/2017 -----------------------------------------  Patient aware of all of her injuries. Absorbable sutures were placed. She'll be following up with her primary care doctor as well as orthopedics and ENT. No headache, dizziness or nausea at this time. She is aware of return precautions including any worsening concerning symptoms, especially headache come and nausea and dizziness. She will also be following up with orthopedics. I discussed case Dr. Sabra Heck recommended an arm sling. She is in the sling at this time and feels comfortable. Says the Percocet also for pain. She'll be discharged with Percocet. She is understanding the plan one to comply.  ____________________________________________   FINAL CLINICAL IMPRESSION(S) / ED DIAGNOSES  lip laceration. Facial laceration. Fracture of the proximal humerus. Concussion.    NEW MEDICATIONS STARTED DURING THIS VISIT:  New Prescriptions   No medications on file     Note:  This document was prepared using Dragon voice recognition software and may include unintentional dictation errors.     Orbie Pyo, MD 03/26/17 [phone]

## 2017-03-26 NOTE — ED Triage Notes (Signed)
Approx 0915 this am tripped and fell and hit face and head on cement. Does not have complete recall of events post, possible LOC. Does not take blood thinners by her report. Facial abrasions and R shoulder pain noted. Alert and oriented in triage.

## 2017-04-07 ENCOUNTER — Ambulatory Visit (INDEPENDENT_AMBULATORY_CARE_PROVIDER_SITE_OTHER): Payer: Worker's Compensation | Admitting: Family Medicine

## 2017-04-07 ENCOUNTER — Encounter: Payer: Self-pay | Admitting: Family Medicine

## 2017-04-07 VITALS — BP 143/82 | Ht 66.0 in | Wt 149.0 lb

## 2017-04-07 DIAGNOSIS — S060X9A Concussion with loss of consciousness of unspecified duration, initial encounter: Secondary | ICD-10-CM | POA: Diagnosis not present

## 2017-04-07 NOTE — Patient Instructions (Signed)
You have a concussion. Take tylenol if needed for headache. Only take ibuprofen or aleve if needed if the tylenol isn't helping you enough. Try to avoid intense reading and minimize screen time. Ok for light cardio if this doesn't worsen your symptoms (walking, stationary bike). Start vestibular therapy (includes oculomotor therapy). Also start cognitive behavioral therapy (see someone who has experience in dealing with this for post-concussion). Follow up with me in 4 weeks for reevaluation. Out of work in the meantime.

## 2017-04-10 DIAGNOSIS — S060X9A Concussion with loss of consciousness of unspecified duration, initial encounter: Secondary | ICD-10-CM

## 2017-04-10 HISTORY — DX: Concussion with loss of consciousness of unspecified duration, initial encounter: S06.0X9A

## 2017-04-10 NOTE — Progress Notes (Signed)
PCP: Crecencio Mc, MD  Subjective:   HPI: Patient is a 71 y.o. female here for concussion.  Patient reports on 10/13 she was going up a ramp and lost her footing, fell forward and onto right shoulder. Caused her to fracture nasal bone, proximal humerus. Reports she had loss of consciousness for a short period. Had headache for 4-5 days with ~5/10 level of pain. Balance problems, dizziness, still feels weak and has trouble focusing. No nausea except from the percocet she took for fractures. No prior history of concussion. Feels things do not look 'quite straight' but denies wavy lines, loss of vision. No weakness, numbness in extremities. She had Guillan Barre in 2005 that causes shakiness in arms since then but otherwise has done well. SCAT 3 18/22 symptoms with score 82/132.  Past Medical History:  Diagnosis Date  . Anxiety   . Depression   . Guillain Barr syndrome ([place]) 2005  . Osteoporosis   . Squamous cell skin cancer 2011    Current Outpatient Prescriptions on File Prior to Visit  Medication Sig Dispense Refill  . ARIPiprazole (ABILIFY) 5 MG tablet Take 2.5 mg by mouth daily.    . Calcium Carbonate-Vitamin D (CALCIUM 600+D HIGH POTENCY PO) Take 1 tablet by mouth daily.    . Cholecalciferol (VITAMIN D3) 2000 UNITS CHEW Chew 1 capsule by mouth daily. Reported on 08/29/2015    . clorazepate (TRANXENE) 7.5 MG tablet Take 7.5 mg by mouth 2 (two) times daily.     Marland Kitchen FLUoxetine (PROZAC) 20 MG tablet Take 20 mg by mouth daily.     . Levomilnacipran HCl ER (FETZIMA) 20 MG CP24 Take 20 mg by mouth.    . oxyCODONE-acetaminophen (ROXICET) 5-325 MG tablet Take 1-2 tablets by mouth every 6 (six) hours as needed. 12 tablet 0  . tamsulosin (FLOMAX) 0.4 MG CAPS capsule Take 0.4 mg by mouth daily.  11  . temazepam (RESTORIL) 30 MG capsule Take 30 mg by mouth at bedtime as needed for sleep.     No current facility-administered medications on file prior to visit.     Past Surgical  History:  Procedure Laterality Date  . ABDOMINAL HYSTERECTOMY  1989  . APPENDECTOMY  1960  . BREAST SURGERY  1980   biopsy  . WRIST FRACTURE SURGERY Right 2015    Allergies  Allergen Reactions  . Oxycodone-Acetaminophen Nausea And Vomiting  . Cephalexin     Other reaction(s): HIVES    Social History   Social History  . Marital status: Widowed    Spouse name: N/A  . Number of children: N/A  . Years of education: N/A   Occupational History  . Not on file.   Social History Main Topics  . Smoking status: Former Smoker    Packs/day: 0.20    Types: Cigarettes    Quit date: 06/14/1977  . Smokeless tobacco: Never Used  . Alcohol use 0.0 oz/week     Comment: social (about once a month)  . Drug use: No  . Sexual activity: No   Other Topics Concern  . Not on file   Social History Narrative  . No narrative on file    Family History  Problem Relation Age of Onset  . Cancer Mother   . Mental illness Sister   . Bipolar disorder Sister   . Depression Brother   . Mental illness Brother   . Parkinson's disease Brother     BP (!) 143/82   Ht 5\' 6"  (1.676 m)  Wt 149 lb (67.6 kg)   BMI 24.05 kg/m   Review of Systems: See HPI above.     Objective:  Physical Exam:  Gen: NAD, comfortable in exam room  Neuro: CN 2-12 grossly intact Moving all extremities without focal weakness. Alert, oriented x 5 Immediate memory 14/15 Concentration 3/5 (5 and 6 digits backwards) Neck FROM without pain Balance 0 errors double leg.  Unsteady tandem - did not attempt single leg Coordination normal finger to nose. Delayed recall 2/5 Fixed gaze with head rotation normal 30 trials Horizontal saccades - could only do 17 trials before symptoms Vertical saccades - could only perform 13 trials   Assessment & Plan:  1. Concussion with loss of consciousness - Patient's first concussion.  Tylenol if needed for headache.  Avoid ibuprofen/aleve except if tylenol not helping enough.  Avoid  intense reading, minimize screen time.  Ok for light cardio if doesn't worsen symptoms.  Start vestibular therapy (with oculomotor therapy) and CBT.  F/u in 4 weeks for reevaluation.  Out of work in meantime.

## 2017-04-10 NOTE — Assessment & Plan Note (Signed)
Patient's first concussion.  Tylenol if needed for headache.  Avoid ibuprofen/aleve except if tylenol not helping enough.  Avoid intense reading, minimize screen time.  Ok for light cardio if doesn't worsen symptoms.  Start vestibular therapy (with oculomotor therapy) and CBT.  F/u in 4 weeks for reevaluation.  Out of work in meantime.

## 2017-04-15 ENCOUNTER — Ambulatory Visit: Payer: PRIVATE HEALTH INSURANCE | Attending: Family Medicine

## 2017-04-15 DIAGNOSIS — R41841 Cognitive communication deficit: Secondary | ICD-10-CM | POA: Diagnosis present

## 2017-04-15 DIAGNOSIS — R2681 Unsteadiness on feet: Secondary | ICD-10-CM | POA: Diagnosis present

## 2017-04-15 DIAGNOSIS — R42 Dizziness and giddiness: Secondary | ICD-10-CM | POA: Diagnosis not present

## 2017-04-15 NOTE — Addendum Note (Signed)
Addended by: Sherrie George F on: 04/15/2017 10:16 AM   Modules accepted: Orders

## 2017-04-15 NOTE — Patient Instructions (Addendum)
Concussion, Adult A concussion is a brain injury from a direct hit (blow) to the head or body. This blow causes the brain to shake quickly back and forth inside the skull. This can damage brain cells and cause chemical changes in the brain. A concussion may also be known as a mild traumatic brain injury (TBI). Concussions are usually not life-threatening, but the effects of a concussion can be serious. If you have a concussion, you are more likely to experience concussion-like symptoms after a direct blow to the head in the future. What are the causes? This condition is caused by:  A direct blow to the head, such as from running into another player during a game, being hit in a fight, or hitting your head on a hard surface.  A jolt of the head or neck that causes the brain to move back and forth inside the skull, such as in a car crash.  What are the signs or symptoms? The signs of a concussion can be hard to notice. Early on, they may be missed by you, family members, and health care providers. You may look fine but act or feel differently. Symptoms are usually temporary, but they may last for days, weeks, or even longer. Some symptoms may appear right away but other symptoms may not show up for hours or days. Every head injury is different. Symptoms may include:  Headaches. This can include a feeling of pressure in the head.  Memory problems.  Trouble concentrating, organizing, or making decisions.  Slowness in thinking, acting or reacting, speaking, or reading.  Confusion.  Fatigue.  Changes in eating or sleeping patterns.  Problems with coordination or balance.  Nausea or vomiting.  Numbness or tingling.  Sensitivity to light or noise.  Vision or hearing problems.  Reduced sense of smell.  Irritability or mood changes.  Dizziness.  Lack of motivation.  Seeing or hearing things that other people do not see or hear (hallucinations).  How is this diagnosed? This  condition is diagnosed based on:  Your symptoms.  A description of your injury.  You may also have tests, including:  Imaging tests, such as a CT scan or MRI. These are done to look for signs of brain injury.  Neuropsychological tests. These measure your thinking, understanding, learning, and remembering abilities.  How is this treated? This condition is treated with physical and mental rest and careful observation, usually at home. If the concussion is severe, you may need to stay home from work for a while. You may be referred to a concussion clinic or to other health care providers for management. It is important that you tell your health care provider if:  You are taking any medicines, including prescription medicines, over-the-counter medicines, and natural remedies. Some medicines, such as blood thinners (anticoagulants) and aspirin, may increase the chance of complications, such as bleeding.  You are taking or have taken alcohol or illegal drugs. Alcohol and certain other drugs may slow your recovery and can put you at risk of further injury.  How fast you will recover from a concussion depends on many factors, such as how severe your concussion is, what part of your brain was injured, how old you are, and how healthy you were before the concussion. Recovery can take time. It is important to wait to return to activity until a health care provider says it is safe to do that and your symptoms are completely gone. Follow these instructions at home: Activity  Limit activities that   require a lot of thought or concentration. These may include: ? Doing homework or job-related work. ? Watching TV. ? Working on the computer. ? Playing memory games and puzzles.  Rest. Rest helps the brain to heal. Make sure you: ? Get plenty of sleep at night. Avoid staying up late at night. ? Keep the same bedtime hours on weekends and weekdays. ? Rest during the day. Take naps or rest breaks when you  feel tired.  Having another concussion before the first one has healed can be dangerous. Do not do high-risk activities that could cause a second concussion, such as riding a bicycle or playing sports.  Ask your health care provider when you can return to your normal activities, such as school, work, athletics, driving, riding a bicycle, or using heavy machinery. Your ability to react may be slower after a brain injury. Never do these activities if you are dizzy. Your health care provider will likely give you a plan for gradually returning to activities. General instructions  Take over-the-counter and prescription medicines only as told by your health care provider.  Do not drink alcohol until your health care provider says you can.  If it is harder than usual to remember things, write them down.  If you are easily distracted, try to do one thing at a time. For example, do not try to watch TV while fixing dinner.  Talk with family members or close friends when making important decisions.  Watch your symptoms and tell others to do the same. Complications sometimes occur after a concussion. Older adults with a brain injury may have a higher risk of serious complications, such as a blood clot in the brain.  Tell your teachers, school nurse, school counselor, coach, athletic trainer, or work manager about your injury, symptoms, and restrictions. Tell them about what you can or cannot do. They should watch for: ? Increased problems with attention or concentration. ? Increased difficulty remembering or learning new information. ? Increased time needed to complete tasks or assignments. ? Increased irritability or decreased ability to cope with stress. ? Increased symptoms.  Keep all follow-up visits as told by your health care provider. This is important. How is this prevented? It is very important to avoid another brain injury, especially as you recover. In rare cases, another injury can lead  to permanent brain damage, brain swelling, or death. The risk of this is greatest during the first 7-10 days after a head injury. Avoid injuries by:  Wearing a seat belt when riding in a car.  Wearing a helmet when biking, skiing, skateboarding, skating, or doing similar activities.  Avoiding activities that could lead to a second concussion, such as contact or recreational sports, until your health care provider says it is okay.  Taking safety measures in your home, such as: ? Removing clutter and tripping hazards from floors and stairways. ? Using grab bars in bathrooms and handrails by stairs. ? Placing non-slip mats on floors and in bathtubs. ? Improving lighting in dim areas.  Contact a health care provider if:  Your symptoms get worse.  You have new symptoms.  You continue to have symptoms for more than 2 weeks. Get help right away if:  You have severe or worsening headaches.  You have weakness or numbness in any part of your body.  Your coordination gets worse.  You vomit repeatedly.  You are sleepier.  The pupil of one eye is larger than the other.  You have convulsions or a   seizure.  Your speech is slurred.  Your fatigue, confusion, or irritability gets worse.  You cannot recognize people or places.  You have neck pain.  It is difficult to wake you up.  You have unusual behavior changes.  You lose consciousness. Summary  A concussion is a brain injury from a direct hit (blow) to the head or body.  A concussion may also be called a mild traumatic brain injury (TBI).  You may have imaging tests and neuropsychological tests to diagnose a concussion.  This condition is treated with physical and mental rest and careful observation.  Ask your health care provider when you can return to your normal activities, such as school, work, athletics, driving, riding a bicycle, or using heavy machinery. Follow safety instructions as told by your health care  provider. This information is not intended to replace advice given to you by your health care provider. Make sure you discuss any questions you have with your health care provider. Document Released: 08/21/2003 Document Revised: 05/11/2016 Document Reviewed: 05/11/2016 Elsevier Interactive Patient Education  2017 [employer].  Access Code: [email or web address]  URL: [email or web address]  Date: 04/15/2017  Prepared by: Roxana Hires   Exercises  Seated Horizontal Smooth Pursuit - 3 sets - 30s hold - 3x daily  Seated Horizontal Saccades - 3 sets - 30s hold - 3x daily  Romberg Stance Eyes Closed on Foam Pad - 3 reps - 30 hold - 2x daily

## 2017-04-15 NOTE — Therapy (Signed)
[place] MAIN [facility] SERVICES [address] [place], [place], [address] Phone: [phone]   Fax:  [phone]  Physical Therapy Evaluation  Patient Details  Name: Holly [name] MRN: [id number] Date of Birth: 1946-02-12 Referring Provider: Dr. Barbaraann Barthel  Encounter Date: 04/15/2017      PT End of Session - 04/15/17 1118    Visit Number 1   Number of Visits 9   Date for PT Re-Evaluation 06/10/17   Authorization Type no g codes/workers compensation   PT Start Time 0815   PT Stop Time 0910   PT Time Calculation (min) 55 min   Activity Tolerance Patient tolerated treatment well   Behavior During Therapy [facility] for tasks assessed/performed      Past Medical History:  Diagnosis Date  . Anxiety   . Depression   . Guillain Barr syndrome ([place]) 2005  . Osteoporosis   . Squamous cell skin cancer 2011    Past Surgical History:  Procedure Laterality Date  . ABDOMINAL HYSTERECTOMY  1989  . APPENDECTOMY  1960  . BREAST SURGERY  1980   biopsy  . WRIST FRACTURE SURGERY Right 2015    There were no vitals filed for this visit.       Subjective Assessment - 04/15/17 0833    Subjective Concussion   Pertinent History Parts of history borrowed by MD. Patient reports on 10/13 she was going up a ramp and lost her footing, fell forward and onto right shoulder. Caused her to fracture nasal bone, proximal humerus. Pt states she bit through her bottom lip. Reports she had loss of consciousness for a short period. Head CT showed nasal fracture but no IC bleed. RUE is immobilized and being managed conservatively. She complains of "heaviness" feeling in head and eyes. She is still having intermittent headaches. She is complaining of balance problems, dizziness, still feels weak and has trouble focusing. She complains of dizziness with quick head turns. Denies vertigo. No prior history of concussion. She had Guillan Barre in 2005 that causes shakiness in arms  since then but otherwise has done well. She reports some mild RLE weakness since the [facility]. Denies any slurring of speech, difficulty, swallowing, or focal numbness/tingling/weakness since the concussion. ROS negative for red flags    Limitations Reading   Diagnostic tests Head CT: fractured nasal bone   Currently in Pain? Yes  Not related to current episode of care   Pain Score 0-No pain  at rest, 3/10 with movement   Pain Location Arm   Pain Orientation Right   Pain Descriptors / Indicators Aching   Pain Type Acute pain   Pain Onset 1 to 4 weeks ago   Pain Frequency Intermittent   Multiple Pain Sites No            [facility] PT Assessment - 04/15/17 0834      Assessment   Medical Diagnosis Concussion with loss of consciousness   Referring Provider Dr. Barbaraann Barthel   Onset Date/Surgical Date 03/26/17   Hand Dominance Right   Next MD Visit 05/04/17   Prior Therapy No     Precautions   Precautions Fall     Restrictions   Weight Bearing Restrictions No     Balance Screen   Has the patient fallen in the past 6 months Yes   How many times? 1   Has the patient had a decrease in activity level because of a fear of falling?  Yes   Is the patient  reluctant to leave their home because of a fear of falling?  Yes     [place] Private residence   Living Arrangements Children   Available Help at Discharge Family   Additional Comments Reports no problems navigating home     Prior Function   Level of Independence Independent     Cognition   Overall Cognitive Status Within Functional Limits for tasks assessed  Pt reports delays in problem solving/decisions     Observation/Other Assessments   Other Surveys  Other Surveys   Activities of Balance Confidence Scale (ABC Scale)  45%   Dizziness Handicap Inventory (DHI)  68/100     Standardized Balance Assessment   Standardized Balance Assessment Dynamic Gait Index     Dynamic Gait Index   Level  Surface Normal   Change in Gait Speed Normal   Gait with Horizontal Head Turns Mild Impairment   Gait with Vertical Head Turns Mild Impairment   Gait and Pivot Turn Normal   Step Over Obstacle Normal   Step Around Obstacles Normal   Steps Mild Impairment   Total Score 21         VESTIBULAR AND BALANCE EVALUATION     Onset Date: 03/26/17  HISTORY:  Parts of history borrowed by MD. Patient reports on 10/13 she was going up a ramp and lost her footing, fell forward and onto right shoulder. Caused her to fracture nasal bone, proximal humerus. Pt states she bit through her bottom lip. Reports she had loss of consciousness for a short period. Head CT showed nasal fracture but no IC bleed. RUE is immobilized and being managed conservatively. She complains of "heaviness" feeling in head and eyes. She is still having intermittent headaches. She is complaining of balance problems, dizziness, still feels weak and has trouble focusing. She complains of dizziness with quick head turns. Denies vertigo. No prior history of concussion. She had Guillan Barre in 2005 that causes shakiness in arms since then but otherwise has done well. She reports some mild RLE weakness since the [facility]. Denies any slurring of speech, difficulty, swallowing, or focal numbness/tingling/weakness since the concussion. ROS negative for red flags   Description of dizziness: (vertigo, unsteadiness, lightheadedness, falling, general unsteadiness, aural fullness): unsteadiness, dizziness Frequency: multiple times daily Duration: seconds  Symptom nature: (motion provoked, positional, spontaneous, constant, variable, intermittent) motion provoked, positional, never spontaneous  Provocative Factors: rolling eyes, turning head quickly, stress, never spontaneous, Easing Factors: sit still, resting  History of similar episodes: None  Falls (yes/no): yes Number of falls in past 6 months: 1  Prior Functional Level: working  full time  Auditory complaints (tinnitus, pain, drainage): Denies Vision (last eye exam, diplopia, recent changes): Denies diplopia, blurring, or visual field cuet  Current Symptoms: Denies dysarthria, dysphagia, drop attacks, bowel and bladder changes, recent weight loss/gain. Review of systems negative for red flags.     EXAMINATION  POSTURE: WNL, RUE in sling  NEUROLOGICAL SCREEN: (2+ unless otherwise noted.) N=normal  Ab=abnormal: RUE mytomoe testing deferred due to RUE immoblizations. Reflex testing deferred  Level Dermatome R L Myotome R L Reflex R L  C3 Anterior Neck N N Sidebend C2-3 N N Jaw CN V    C4 Top of Shoulder N N Shoulder Shrug C4  N Hoffman's UMN    C5 Lateral Upper Arm N N Shoulder ABD C4-5  N Biceps C5-6    C6 Lateral Arm/ Thumb N N Arm Flex/ Wrist Ext C5-6  N  Brachiorad. C5-6    C7 Middle Finger N N Arm Ext//Wrist Flex C6-7  N Triceps C7    C8 4th & 5th Finger N N Flex/ Ext Carpi Ulnaris C8  N Patella    T1 Medial Arm N N Interossei T1  N Gastrocnemius    L2 Medial thigh/groin N N Illiopsoas (L2-3) A N     L3 Lower thigh/med.knee N N Quadriceps (L3-4) N N Patellar (L3-4)    L4 Medial leg/lat thigh N N Tibialis Ant (L4-5) N N     L5 Lat. leg & dorsal foot N N EHL (L5) N N     S1 post/lat foot/thigh/leg N N Gastrocnemius (S1-2) N N Gastrocnemius (S1)    S2 Post./med. thigh & leg N N Hamstrings (L4-S3) N N Babinski          COORDINATION: LUE Finger to Nose, rapid alternating movements WNL. Bilateral heel to shin normal.    MUSCULOSKELETAL SCREEN: Cervical Spine ROM: Mild extension limitation otherwise WNL.    ROM: RUE immobilized. LUE and bilateral LE WFL  MMT: LUE and bilateral LE WFL. Slight decrease in R hip flexion strength s/p Guillan Barre syndrome  Gait: Scanning of visual environment with gait is: decreased spontaneous head turns.   Balance: Decreased with DGI of 21/24, mCTSIB with impairments with eyes closed on firm and foam  surfaces  POSTURAL CONTROL TESTS:   Clinical Test of Sensory Interaction for Balance    (CTSIB):  CONDITION TIME STRATEGY SWAY  Eyes open, firm surface 30 seconds  1+  Eyes closed, firm surface 9 seconds ankle 4+  Eyes open, foam surface 30 seconds ankle 2+  Eyes closed, foam surface 3 seconds ankle 4+    OCULOMOTOR / VESTIBULAR TESTING:  Oculomotor Exam- Room Light  Normal Abnormal Comments  Ocular Alignment N  Possible minor resting alignment abnormality but baseline for patient per report. She wears corrective lenses.  Ocular ROM N    Spontaneous Nystagmus N    End-Gaze Nystagmus N    Smooth Pursuit  A Mildly saccadic but not severe. Reproduction of dizziness reported.   Saccades  A Dizziness. Multiple corrections required to the L and in the vertical plane. Appears grossly normal to the R.   VOR N    VOR Cancellation N    Left Head Thrust   Deferred due to concussion  Right Head Thrust     Head Shaking Nystagmus   Deferred due to concussion  Static Acuity     Dynamic Acuity       BPPV TESTS: Deferred due to concussion and no support of diagnosis in history  FUNCTIONAL OUTCOME MEASURES:  Results Comments  DHI 68/100 Severe perception of handicap; in need of intervention  ABC Scale 45% Falls risk; in need of intervention  DGI 21/24 Falls risk; in need of intervention  10 meter Walking Speed            Objective measurements completed on examination: See above findings.         Pt issued HEP including seated horizontal smooth pursuits, horizontal saccades, and standing balance with feet together. Handout provided to patient and briefly reviewed. Pt reports understanding of exercises.                       PT Education - 04/15/17 0837    Education provided Yes   Education Details concussion education   Person(s) Educated Patient   Methods Explanation   Comprehension Verbalized understanding  PT Short Term Goals - 04/15/17  1142      PT SHORT TERM GOAL #1   Title Pt will be independent with HEP in order to improve balance in order to decrease fall risk and improve function at home and work.   Time 4   Period Weeks   Status New   Target Date 05/13/17           PT Long Term Goals - 04/15/17 1142      PT LONG TERM GOAL #1   Title Pt will improve ABC by at least 13% in order to demonstrate clinically significant improvement in balance confidence.    Baseline 04/15/17: 45%   Time 8   Period Weeks   Status New   Target Date 06/10/17     PT LONG TERM GOAL #2   Title Pt will decrease DHI score by at least 18 points in order to demonstrate clinically significant reduction in disability    Baseline 04/15/17: 68/100   Time 8   Status New   Target Date 06/10/17     PT LONG TERM GOAL #3   Title Pt will improve DGI to 24/24 in order to demonstrate improved ability to perform head turns with ambulation as well as safety with stairs   Baseline 04/15/17: 21/24   Time 8   Period Weeks   Status New   Target Date 06/10/17                Plan - 04/15/17 1139    Clinical Impression Statement Pt is a pleasant 71 yo female referred for dizziness and imbalance following concussion. She is complaining of balance problems, dizziness, weakness and difficulty focusing. Denies vertigo. No prior history of concussion. She reports that symptoms occur with rolling eyes, turning head quickly, and increased stress. PT evaluation reveals severe self-reported disability with DHI of 68/100. ABC indicates poor balance confidence with pt scoring 45%. She reports reproduction of symptoms today with smooth pursuit and saccade testing. Significant impairment on both firm and foam surfaces with eyes closed. Staggering noted with vertical and horizontal head turns with ambulation with pt scoring 21/24 on DGI. No spontaneous nystagmus observed. VOR and VOR cancellation negative. Deferred head shaking and VOR thrust testing due to  concussion. Dix-Hallpike also deferred due to concussion and patient's history does not support a diagnosis of BPPV. Pt provided handout with extensive education regarding activity modification and rest following concussion. Pt also provided HEP including smooth pursuit and saccade training as well as standing balance with eyes closed. Will continue to progress exercises with patient as tolerated. Pt will benefit from PT services to address deficits in dizziness, balance, and mobility in order to return to full function at home and work.    History and Personal Factors relevant to plan of care: 3 or more personal factors/comorbidities, 4 or more body systems/activity limitations/participation restrictions    Clinical Presentation Unstable   Clinical Presentation due to: Highly variable symptoms   Clinical Decision Making High   Rehab Potential Excellent   PT Frequency 1x / week   PT Duration 8 weeks   PT Treatment/Interventions Aquatic Therapy;ADLs/Self Care Home Management;Canalith Repostioning;Gait training;Stair training;DME Instruction;Functional mobility training;Therapeutic activities;Balance training;Therapeutic exercise;Neuromuscular re-education;Patient/family education;Manual techniques;Vestibular;Energy conservation   PT Next Visit Plan Review HEP, perform BERG and 72m gait speed, consider adding VOR x 1 horizontal to HEP as well as additional balance exercises as appropriate   PT Home Exercise Plan Seated horizontal smooth pursuit, seated horizontal saccades, standing  balance with feet apart/together with eyes closed.    Consulted and Agree with Plan of Care Patient      Patient will benefit from skilled therapeutic intervention in order to improve the following deficits and impairments:  Decreased activity tolerance, Decreased balance, Dizziness  Visit Diagnosis: Dizziness and giddiness - Plan: PT plan of care cert/re-cert  Unsteadiness on feet - Plan: PT plan of care  cert/re-cert     Problem List Patient Active Problem List   Diagnosis Date Noted  . Concussion with loss of consciousness 04/10/2017  . 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 Encounter for screening colonoscopy 03/16/2013  . Routine general medical examination at a health care facility 03/16/2013  . Right leg weakness 01/23/2013  . History of Guillain-Barre syndrome 01/23/2013  . Radial fracture 01/23/2013  . 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 UNSPECIFIED VITAMIN D DEFICIENCY 08/26/2008  . UNSPECIFIED ANEMIA 08/26/2008  . Recurrent depressive disorder, current episode moderate ([place]) 07/29/2008  . Fatigue due to depression 07/29/2008  . WEIGHT LOSS 07/29/2008   Phillips Grout PT, DPT   Mekayla Soman 04/15/2017, 11:49 AM  [place] MAIN [facility] SERVICES [address] [place], [place], [address] Phone: [phone]   Fax:  [phone]  Name: MICHOLE LECUYER MRN: [id number] Date of Birth: 1945/09/05

## 2017-04-21 ENCOUNTER — Telehealth: Payer: Self-pay | Admitting: Internal Medicine

## 2017-04-21 NOTE — Telephone Encounter (Signed)
Please advise 

## 2017-04-21 NOTE — Telephone Encounter (Signed)
Copied from [place] #[id number]. Topic: Inquiry >> Apr 21, 2017  9:32 AM Darl Householder, RMA wrote: Reason for CRM: pt is requesting Prolia injection, please advise patient once insurance is verified

## 2017-04-22 ENCOUNTER — Other Ambulatory Visit: Payer: Self-pay

## 2017-04-22 ENCOUNTER — Ambulatory Visit: Payer: PRIVATE HEALTH INSURANCE

## 2017-04-22 VITALS — BP 151/74 | HR 80

## 2017-04-22 DIAGNOSIS — R2681 Unsteadiness on feet: Secondary | ICD-10-CM

## 2017-04-22 DIAGNOSIS — R42 Dizziness and giddiness: Secondary | ICD-10-CM

## 2017-04-22 NOTE — Telephone Encounter (Signed)
This is for prolia do you have anything on hers.

## 2017-04-22 NOTE — Telephone Encounter (Signed)
She needs a request started.

## 2017-04-22 NOTE — Therapy (Signed)
[place] MAIN [facility] SERVICES [address] [place], [place], [address] Phone: [phone]   Fax:  [phone]  Physical Therapy Treatment  Patient Details  Name: Holly [name] MRN: [id number] Date of Birth: May 08, 1946 Referring Provider: Dr. Barbaraann Barthel   Encounter Date: 04/22/2017  PT End of Session - 04/22/17 0814    Visit Number  2    Number of Visits  9    Date for PT Re-Evaluation  06/10/17    Authorization Type  no g codes/workers compensation    PT Start Time  0815    PT Stop Time  0905    PT Time Calculation (min)  50 min    Activity Tolerance  Patient tolerated treatment well    Behavior During Therapy  [facility] for tasks assessed/performed       Past Medical History:  Diagnosis Date  . Anxiety   . Depression   . Guillain Barr syndrome ([place]) 2005  . Osteoporosis   . Squamous cell skin cancer 2011    Past Surgical History:  Procedure Laterality Date  . ABDOMINAL HYSTERECTOMY  1989  . APPENDECTOMY  1960  . BREAST SURGERY  1980   biopsy  . WRIST FRACTURE SURGERY Right 2015    Vitals:   04/22/17 0817  BP: (!) 151/74  Pulse: 80  SpO2: 100%    Subjective Assessment - 04/22/17 0814    Subjective  Pt reports that she is doing well at this time. She notes decreased dizziness at home but continues to reports significant difficulty when out in the community and with increase in stress. She is performing HEP without issue and notes decreased symptoms since first starting. No specific questions or concerns at this time.     Pertinent History  Parts of history borrowed by MD. Patient reports on 10/13 she was going up a ramp and lost her footing, fell forward and onto right shoulder. Caused her to fracture nasal bone, proximal humerus. Pt states she bit through her bottom lip. Reports she had loss of consciousness for a short period. Head CT showed nasal fracture but no IC bleed. RUE is immobilized and being managed conservatively. She  complains of "heaviness" feeling in head and eyes. She is still having intermittent headaches. She is complaining of balance problems, dizziness, still feels weak and has trouble focusing. She complains of dizziness with quick head turns. Denies vertigo. No prior history of concussion. She had Guillan Barre in 2005 that causes shakiness in arms since then but otherwise has done well. She reports some mild RLE weakness since the [facility]. Denies any slurring of speech, difficulty, swallowing, or focal numbness/tingling/weakness since the concussion. ROS negative for red flags     Limitations  Reading    Diagnostic tests  Head CT: fractured nasal bone    Currently in Pain?  No/denies         [facility] PT Assessment - 04/22/17 0825      Standardized Balance Assessment   Standardized Balance Assessment  Berg Balance Test;10 meter walk test    10 Meter Walk  8.3s = 1.2 m/s      Berg Balance Test   Sit to Stand  Able to stand without using hands and stabilize independently    Standing Unsupported  Able to stand safely 2 minutes    Sitting with Back Unsupported but Feet Supported on Floor or Stool  Able to sit safely and securely 2 minutes    Stand to Sit  Sits safely with minimal use of hands    Transfers  Able to transfer safely, minor use of hands    Standing Unsupported with Eyes Closed  Able to stand 10 seconds safely    Standing Ubsupported with Feet Together  Able to place feet together independently and stand 1 minute safely    From Standing, Reach Forward with Outstretched Arm  Can reach confidently >25 cm (10")    From Standing Position, Pick up Object from Floor  Able to pick up shoe safely and easily    From Standing Position, Turn to Look Behind Over each Shoulder  Looks behind from both sides and weight shifts well    Turn 360 Degrees  Able to turn 360 degrees safely in 4 seconds or less    Standing Unsupported, Alternately Place Feet on Step/Stool  Able to stand independently and  safely and complete 8 steps in 20 seconds    Standing Unsupported, One Foot in Front  Able to plae foot ahead of the other independently and hold 30 seconds    Standing on One Leg  Able to lift leg independently and hold > 10 seconds    Total Score  55    Berg comment:  55/56     TREATMENT   Neuromuscular Re-education Performed BERG (55/56) and 6m gait speed (1.2 m/s) with patient;  Smooth Pursuits Horizontal x 60 seconds, cues for appropriate excursion due to excessive lateral movement to the left, no dizziness; Vertical, 60 seconds x 2, education about proper form, 1-2/10 dizzines (replaced horizontal with vertical smooth pursuits for HEP);  Saccades Horizontal x 60 seconds, cues for appropriate excursion and to hold gaze for 1-2 seconds, no dizziness; Vertical, 60 seconds x 2, no dizziness but ocular fatigue reported (advanced pt from horizontal to vertical saccades for HEP); Will consider this in front of patterned background at next visit to see if it provokes symptoms;  VOR VOR x 1 horizontal in sitting with back support 60s x 2, stopped after 2 sets due to increase in dizziness (added to HEP)   Semitandem Semitandem balance with horizontal head turns, alternating LE forward x 30s each (added to HEP);  Airex Airex balance with feet apart/together eyes closed x 30s in each condition (added Airex balance with feet together to HEP);                   PT Education - 04/22/17 0814    Education provided  Yes    Education Details  HEP progression, exercise form/technique    Person(s) Educated  Patient    Methods  Explanation    Comprehension  Verbalized understanding       PT Short Term Goals - 04/15/17 1142      PT SHORT TERM GOAL #1   Title  Pt will be independent with HEP in order to improve balance in order to decrease fall risk and improve function at home and work.    Time  4    Period  Weeks    Status  New    Target Date  05/13/17        PT  Long Term Goals - 04/15/17 1142      PT LONG TERM GOAL #1   Title  Pt will improve ABC by at least 13% in order to demonstrate clinically significant improvement in balance confidence.     Baseline  04/15/17: 45%    Time  8    Period  Weeks  Status  New    Target Date  06/10/17      PT LONG TERM GOAL #2   Title  Pt will decrease DHI score by at least 18 points in order to demonstrate clinically significant reduction in disability     Baseline  04/15/17: 68/100    Time  8    Status  New    Target Date  06/10/17      PT LONG TERM GOAL #3   Title  Pt will improve DGI to 24/24 in order to demonstrate improved ability to perform head turns with ambulation as well as safety with stairs    Baseline  04/15/17: 21/24    Time  8    Period  Weeks    Status  New    Target Date  06/10/17            Plan - 04/22/17 0814    Clinical Impression Statement  Pt is making progress with therapy. She reports less symptoms at rest and when at home. HEP initially provoked dizziness but this improved over the last week. She reports significant increase in dizziness today with VOR so this was issued to HEP. Pt was also instructed to modify smooth pursuit and saccades to vertical direction and add feet together eyes closed balance on foam. Pt encouraged to continue to limit activity and progress slowly. Follow-up as scheduled. Pt will benefit from PT services to address deficits in dizziness, balance, and mobility in order to return to full function at home and work.    Rehab Potential  Excellent    PT Frequency  1x / week    PT Duration  8 weeks    PT Treatment/Interventions  Aquatic Therapy;ADLs/Self Care Home Management;Canalith Repostioning;Gait training;Stair training;DME Instruction;Functional mobility training;Therapeutic activities;Balance training;Therapeutic exercise;Neuromuscular re-education;Patient/family education;Manual techniques;Vestibular;Energy conservation    PT Next Visit Plan  Review  VOR and progress as tolerated, progress semitandem head turns, attempt diagonal/random smooth pursuit, attempt saccades on conflicting background    PT Home Exercise Plan  Seated vertical smooth pursuit, seated vertical saccades, standing balance with feet together on foam with eyes closed, VOR x 1 horizontal in sitting, semitandem balance with horizontal head turns;    Consulted and Agree with Plan of Care  Patient       Patient will benefit from skilled therapeutic intervention in order to improve the following deficits and impairments:  Decreased activity tolerance, Decreased balance, Dizziness  Visit Diagnosis: Dizziness and giddiness  Unsteadiness on feet     Problem List Patient Active Problem List   Diagnosis Date Noted  . Concussion with loss of consciousness 04/10/2017  . 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 Encounter for screening colonoscopy 03/16/2013  . Routine general medical examination at a health care facility 03/16/2013  . Right leg weakness 01/23/2013  . History of Guillain-Barre syndrome 01/23/2013  . Radial fracture 01/23/2013  . 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 UNSPECIFIED VITAMIN D DEFICIENCY 08/26/2008  . UNSPECIFIED ANEMIA 08/26/2008  . Recurrent depressive disorder, current episode moderate ([place]) 07/29/2008  . Fatigue due to depression 07/29/2008  . WEIGHT LOSS 07/29/2008   Phillips Grout PT, DPT   Huprich,Jason 04/22/2017, 11:56 AM  [place] MAIN [facility] SERVICES [address] [place], [place], [address] Phone: [phone]   Fax:  [phone]  Name: Holly [name] MRN: [id number] Date of Birth: Mar 16, 1946

## 2017-04-22 NOTE — Telephone Encounter (Signed)
I do not.

## 2017-04-25 DIAGNOSIS — S42209A Unspecified fracture of upper end of unspecified humerus, initial encounter for closed fracture: Secondary | ICD-10-CM | POA: Insufficient documentation

## 2017-04-25 NOTE — Telephone Encounter (Signed)
Copied from [place] #[id number]. Topic: Inquiry >> Apr 21, 2017  9:03 AM Darl Householder, RMA wrote: Reason for CRM: patient is requesting a Prolia injection, please advise pt once insurance is verified.

## 2017-04-26 ENCOUNTER — Other Ambulatory Visit: Payer: Self-pay

## 2017-04-26 ENCOUNTER — Encounter: Payer: Self-pay | Admitting: Physical Therapy

## 2017-04-26 ENCOUNTER — Ambulatory Visit: Payer: PRIVATE HEALTH INSURANCE | Admitting: Physical Therapy

## 2017-04-26 DIAGNOSIS — R42 Dizziness and giddiness: Secondary | ICD-10-CM

## 2017-04-26 DIAGNOSIS — R2681 Unsteadiness on feet: Secondary | ICD-10-CM

## 2017-04-26 NOTE — Therapy (Signed)
[place] MAIN [facility] SERVICES [address] [place], [place], [address] Phone: [phone]   Fax:  [phone]  Physical Therapy Treatment  Patient Details  Name: Holly [name] MRN: [id number] Date of Birth: 06-07-1946 Referring Provider: Dr. Barbaraann Barthel   Encounter Date: 04/26/2017  PT End of Session - 04/26/17 0849    Visit Number  3    Number of Visits  9    Date for PT Re-Evaluation  06/10/17    Authorization Type  no g codes/workers compensation    PT Start Time  0850    PT Stop Time  0935    PT Time Calculation (min)  45 min    Activity Tolerance  Patient tolerated treatment well    Behavior During Therapy  [facility] for tasks assessed/performed       Past Medical History:  Diagnosis Date  . Anxiety   . Depression   . Guillain Barr syndrome ([place]) 2005  . Osteoporosis   . Squamous cell skin cancer 2011    Past Surgical History:  Procedure Laterality Date  . ABDOMINAL HYSTERECTOMY  1989  . APPENDECTOMY  1960  . BREAST SURGERY  1980   biopsy  . WRIST FRACTURE SURGERY Right 2015    There were no vitals filed for this visit.  Subjective Assessment - 04/26/17 0849    Subjective  Patient states that she is feeling okay except for her head. Patient states she feels she will need therapy for her arm in a few weeks. Patient states "in small doses I'm fine" and if she goes out she can only tolerate an hour or two and then needs to go home and "rest my head".     Pertinent History  Parts of history borrowed by MD. Patient reports on 10/13 she was going up a ramp and lost her footing, fell forward and onto right shoulder. Caused her to fracture nasal bone, proximal humerus. Pt states she bit through her bottom lip. Reports she had loss of consciousness for a short period. Head CT showed nasal fracture but no IC bleed. RUE is immobilized and being managed conservatively. She complains of "heaviness" feeling in head and eyes. She is still having  intermittent headaches. She is complaining of balance problems, dizziness, still feels weak and has trouble focusing. She complains of dizziness with quick head turns. Denies vertigo. No prior history of concussion. She had Guillan Barre in 2005 that causes shakiness in arms since then but otherwise has done well. She reports some mild RLE weakness since the [facility]. Denies any slurring of speech, difficulty, swallowing, or focal numbness/tingling/weakness since the concussion. ROS negative for red flags     Limitations  Reading    Diagnostic tests  Head CT: fractured nasal bone    Currently in Pain?  Yes    Pain Score  3     Pain Location  Arm    Pain Orientation  Right    Pain Type  Acute pain    Pain Onset  1 to 4 weeks ago         Neuromuscular Re-education:  Patient reports headaches have gone down to 1 or 2 per week and the headaches have gotten better. Patient states she will get a headache if she starts to over exert herself.  Patient states she felt her eyes are heavier feeling today and that she does not feel as sharp.  Saccades: Patient performed in sitting multiple 1 minute reps of horizontal and  vertical saccades with busy background with rest breaks. Patient reports that the busy background was more challenging especially with the vertical saccades. Added conflicting background to HEP- instructed to gradually add the conflicting background up to a mix of 40% with busy background  and 60% with plain background.   Ball tracking : Patient performed in sitting, watching a ball while therapist moved ball horizontally and then circles while patient tracked ball with head and eyes with verbal cues to turn the head. Patient states she feels "heaviness" after doing this exercise.  Ambulation with head turns:  Patient performed 175' forwards ambulation while scanning for targets in hallway. Patient performed 175' trials of forwards ambulation with horizontal and vertical head  turns with CGA. Patient performed a rep of 55' and one rep of 39' of retro ambulation with horizontal head turns with CGA.  Patient demonstrates mild veering at times with vertical head turns more so than horizontal head turns and with retro ambulation.  Patient reports mild dizziness and imbalance with ambulation with head turns and reports 5/10 dizziness with retro ambulation while performing horizontal head turns.      PT Education - 04/26/17 0849    Education provided  Yes    Education Details  progressed to gradually adding conflicting background to saccades exercise    Person(s) Educated  Patient    Methods  Explanation    Comprehension  Verbalized understanding;Returned demonstration       PT Short Term Goals - 04/15/17 1142      PT SHORT TERM GOAL #1   Title  Pt will be independent with HEP in order to improve balance in order to decrease fall risk and improve function at home and work.    Time  4    Period  Weeks    Status  New    Target Date  05/13/17        PT Long Term Goals - 04/15/17 1142      PT LONG TERM GOAL #1   Title  Pt will improve ABC by at least 13% in order to demonstrate clinically significant improvement in balance confidence.     Baseline  04/15/17: 45%    Time  8    Period  Weeks    Status  New    Target Date  06/10/17      PT LONG TERM GOAL #2   Title  Pt will decrease DHI score by at least 18 points in order to demonstrate clinically significant reduction in disability     Baseline  04/15/17: 68/100    Time  8    Status  New    Target Date  06/10/17      PT LONG TERM GOAL #3   Title  Pt will improve DGI to 24/24 in order to demonstrate improved ability to perform head turns with ambulation as well as safety with stairs    Baseline  04/15/17: 21/24    Time  8    Period  Weeks    Status  New    Target Date  06/10/17            Plan - 04/26/17 0849    Clinical Impression Statement  Patient reporting gradual improvement in her  headache symptoms and dizziness. Patient challenged by conflicting background progression with saccades activity as well as ambulation with head turns and scanning for targets in the hallway this date. Patient would benefit from continued practice with these activities. Encouraged patient to follow-up  as indicated.     Rehab Potential  Excellent    PT Frequency  1x / week    PT Duration  8 weeks    PT Treatment/Interventions  Aquatic Therapy;ADLs/Self Care Home Management;Canalith Repostioning;Gait training;Stair training;DME Instruction;Functional mobility training;Therapeutic activities;Balance training;Therapeutic exercise;Neuromuscular re-education;Patient/family education;Manual techniques;Vestibular;Energy conservation    PT Next Visit Plan  Review VOR and progress as tolerated, progress semitandem head turns, attempt diagonal/random smooth pursuit, retro ambulation with head turns    PT Home Exercise Plan  Seated vertical smooth pursuit, seated vertical saccades with gradually adding in conflicting background, standing balance with feet together on foam with eyes closed, VOR x 1 horizontal in sitting, semitandem balance with horizontal head turns;    Consulted and Agree with Plan of Care  Patient       Patient will benefit from skilled therapeutic intervention in order to improve the following deficits and impairments:  Decreased activity tolerance, Decreased balance, Dizziness  Visit Diagnosis: Dizziness and giddiness  Unsteadiness on feet     Problem List Patient Active Problem List   Diagnosis Date Noted  . Concussion with loss of consciousness 04/10/2017  . 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 Encounter for screening colonoscopy 03/16/2013  . Routine general medical examination at a health care facility 03/16/2013  . Right leg weakness 01/23/2013  . History of Guillain-Barre syndrome 01/23/2013  . Radial fracture 01/23/2013  . 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 UNSPECIFIED  VITAMIN D DEFICIENCY 08/26/2008  . UNSPECIFIED ANEMIA 08/26/2008  . Recurrent depressive disorder, current episode moderate ([place]) 07/29/2008  . Fatigue due to depression 07/29/2008  . WEIGHT LOSS 07/29/2008    Mally Gavina 04/26/2017, 10:11 AM  [place] MAIN [facility] SERVICES [address] [place], [place], [address] Phone: [phone]   Fax:  [phone]  Name: JOSLYNN JAMROZ MRN: [id number] Date of Birth: 1945/10/06

## 2017-04-27 ENCOUNTER — Ambulatory Visit: Payer: PRIVATE HEALTH INSURANCE | Admitting: Speech Pathology

## 2017-04-27 DIAGNOSIS — R41841 Cognitive communication deficit: Secondary | ICD-10-CM

## 2017-04-27 DIAGNOSIS — R42 Dizziness and giddiness: Secondary | ICD-10-CM | POA: Diagnosis not present

## 2017-04-28 ENCOUNTER — Encounter: Payer: Self-pay | Admitting: Speech Pathology

## 2017-04-28 ENCOUNTER — Other Ambulatory Visit: Payer: Self-pay

## 2017-04-28 NOTE — Therapy (Signed)
[place] MAIN [facility] SERVICES [address] [place], [place], [address] Phone: [phone]   Fax:  [phone]  Speech Language Pathology Evaluation  Patient Details  Name: Holly [name] MRN: [id number] Date of Birth: August 03, 1945 Referring Provider: Dene Gentry    Encounter Date: 04/27/2017  End of Session - 04/28/17 1306    Visit Number  1    Number of Visits  9    Date for SLP Re-Evaluation  05/27/17    SLP Start Time  1500    SLP Stop Time   1556    SLP Time Calculation (min)  56 min    Activity Tolerance  Patient tolerated treatment well       Past Medical History:  Diagnosis Date  . Anxiety   . Depression   . Guillain Barr syndrome ([place]) 2005  . Osteoporosis   . Squamous cell skin cancer 2011    Past Surgical History:  Procedure Laterality Date  . ABDOMINAL HYSTERECTOMY  1989  . APPENDECTOMY  1960  . BREAST SURGERY  1980   biopsy  . WRIST FRACTURE SURGERY Right 2015    There were no vitals filed for this visit.      SLP Evaluation [facility] - 04/28/17 0001      SLP Visit Information   SLP Received On  04/27/17    Referring Provider  Karlton Lemon R     Onset Date  03/26/2017    Medical Diagnosis  Concussion      Subjective   Subjective  "I have trouble concentrating and focusing"    Patient/Family Stated Goal  Functional communication and cognitive skills for successful return to work.      Pain Assessment   Currently in Pain?  Yes      General Information   HPI  Per MD: Patient is a 71 y.o. female here for concussion.  Patient reports on 10/13 she was going up a ramp and lost her footing, fell forward and onto right shoulder.  Caused her to fracture nasal bone, proximal humerus.  Reports she had loss of consciousness for a short period.  Had headache for 4-5 days with ~5/10 level of pain.  Balance problems, dizziness, still feels weak and has trouble focusing.  No nausea except from the percocet she took  for fractures.  No prior history of concussion.  Feels things do not look 'quite straight' but denies wavy lines, loss of vision.  No weakness, numbness in extremities.  She had Guillan Barre in 2005 that causes shakiness in arms since then but otherwise has done well.      Prior Functional Status   Cognitive/Linguistic Baseline  Within functional limits      Cognition   Overall Cognitive Status  Impaired/Different from baseline    Area of Impairment  Attention;Memory;Problem solving    Problem Solving  Slow processing    Executive Function  Organizing;Decision Making      Auditory Comprehension   Overall Auditory Comprehension  Appears within functional limits for tasks assessed      Verbal Expression   Overall Verbal Expression  Impaired      Oral Motor/Sensory Function   Overall Oral Motor/Sensory Function  Appears within functional limits for tasks assessed      Motor Speech   Overall Motor Speech  Appears within functional limits for tasks assessed      Standardized Assessments   Standardized Assessments   Cognitive Linguistic Quick Test  Cognitive Linguistic Quick Test  The Cognitive Linguistic Quick Test (CLQT) was administered to assess the relative status of five cognitive domains: attention, memory, language, executive functioning, and visuospatial skills. Scores from 10 tasks were used to estimate severity ratings (for age groups 18-69 years and 70-89 years) for each domain, a clock drawing task, as well as an overall composite severity rating of cognition.    Task    Score  Criterion Cut Scores Personal Facts     8/8   8  Symbol Cancellation     12/12  11 Confrontation Naming    10/10   10 Clock Drawing      12/13  12 Story Retelling       6/10   6 Symbol Trails      10/10  9 Generative Naming      5/9   5 Design Memory     6/6   5 Mazes        8/8   7 Design Generation    6/13   6  Cognitive Domain  Composite Score Severity  Rating Attention   200/215  WNL  Memory   157/185  WNL Executive Function  29/40   WNL Language   29/37   WNL Visuospatial Skills  98/105   WNL Clock Drawing   12/13   WNL Composite Severity Rating WNL   SLP Education - 04/28/17 1305    Education provided  Yes    Education Details  results of testing, recommended speech therapy goals    Person(s) Educated  Patient    Methods  Explanation    Comprehension  Verbalized understanding         SLP Long Term Goals - 04/28/17 1310      SLP LONG TERM GOAL #1   Title  Patient will identify cognitive-communication barriers and participate in developing functional compensatory strategies.    Time  4    Period  Weeks    Status  New    Target Date  05/27/17      SLP LONG TERM GOAL #2   Title  Patient will complete complex attention, executive function, memory, and visual-spatial activities with 80% accuracy.    Time  4    Period  Weeks    Status  New    Target Date  05/27/17      SLP LONG TERM GOAL #3   Title  Patient will demonstrate functional cognitive-communication skills for successful return to work.    Time  4    Period  Weeks    Status  New    Target Date  05/27/17      SLP LONG TERM GOAL #4   Title  Patient will generate grammatical, fluent, and cogent sentence to complete abstract/complex linguistic task with 80% accuracy.    Time  4    Period  Weeks    Status  New    Target Date  05/27/17       Plan - 04/28/17 1306    Clinical Impression Statement  This 71 year old woman, with concussion 03/26/2017, is presenting with mild cognitive communication impairment characterized by reduced attention, focus, memory, slowed processing, and word finding.  Although the results of the Cognitive Linguistic Quick Test (CLQT) indicate a composite severity rating of WNL, the patient scored less than anticipated given her prior level of function.  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  The patient will benefit from restorative and  compensatory treatment of cognitive communication deficits for successful return to work when medically appropriate.    Speech Therapy Frequency  2x / week    Duration  4 weeks    Treatment/Interventions  Cognitive reorganization;Internal/external aids;Compensatory strategies;SLP instruction and feedback;Patient/family education    Potential to Achieve Goals  Good    Potential Considerations  Ability to learn/carryover information;Co-morbidities;Cooperation/participation level;Medical prognosis;Previous level of function;Severity of impairments;Family/community support    SLP Home Exercise Plan  To be determined    Consulted and Agree with Plan of Care  Patient       Patient will benefit from skilled therapeutic intervention in order to improve the following deficits and impairments:   Cognitive communication deficit - Plan: SLP plan of care cert/re-cert    Problem List Patient Active Problem List   Diagnosis Date Noted  . Concussion with loss of consciousness 04/10/2017  . 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 Encounter for screening colonoscopy 03/16/2013  . Routine general medical examination at a health care facility 03/16/2013  . Right leg weakness 01/23/2013  . History of Guillain-Barre syndrome 01/23/2013  . Radial fracture 01/23/2013  . 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 UNSPECIFIED VITAMIN D DEFICIENCY 08/26/2008  . UNSPECIFIED ANEMIA 08/26/2008  . Recurrent depressive disorder, current episode moderate ([place]) 07/29/2008  . Fatigue due to depression 07/29/2008  . WEIGHT LOSS 07/29/2008   Leroy Sea, MS/CCC- SLP  Lou Miner 04/28/2017, 1:17 PM  [place] MAIN [facility] SERVICES [address] [place], [place], [address] Phone: [phone]   Fax:  [phone]  Name: SHALIAH WANN MRN: [id number] Date of Birth: [birth year]

## 2017-05-02 ENCOUNTER — Ambulatory Visit: Payer: PRIVATE HEALTH INSURANCE | Admitting: Physical Therapy

## 2017-05-02 ENCOUNTER — Other Ambulatory Visit: Payer: Self-pay

## 2017-05-02 ENCOUNTER — Ambulatory Visit: Payer: PRIVATE HEALTH INSURANCE | Admitting: Speech Pathology

## 2017-05-02 ENCOUNTER — Encounter: Payer: Self-pay | Admitting: Speech Pathology

## 2017-05-02 ENCOUNTER — Encounter: Payer: Self-pay | Admitting: Physical Therapy

## 2017-05-02 DIAGNOSIS — R2681 Unsteadiness on feet: Secondary | ICD-10-CM

## 2017-05-02 DIAGNOSIS — R42 Dizziness and giddiness: Secondary | ICD-10-CM | POA: Diagnosis not present

## 2017-05-02 DIAGNOSIS — R41841 Cognitive communication deficit: Secondary | ICD-10-CM

## 2017-05-02 NOTE — Therapy (Signed)
[place] MAIN [facility] SERVICES [address] [place], [place], [address] Phone: [phone]   Fax:  [phone]  Speech Language Pathology Treatment  Patient Details  Name: Holly [name] MRN: [id number] Date of Birth: 1945-08-24 Referring Provider: Dene Gentry    Encounter Date: 05/02/2017  End of Session - 05/02/17 1324    Visit Number  2    Number of Visits  9    Date for SLP Re-Evaluation  05/27/17    SLP Start Time  1000    SLP Stop Time   1054    SLP Time Calculation (min)  54 min    Activity Tolerance  Patient tolerated treatment well       Past Medical History:  Diagnosis Date  . Anxiety   . Depression   . Guillain Barr syndrome ([place]) 2005  . Osteoporosis   . Squamous cell skin cancer 2011    Past Surgical History:  Procedure Laterality Date  . ABDOMINAL HYSTERECTOMY  1989  . APPENDECTOMY  1960  . BREAST SURGERY  1980   biopsy  . WRIST FRACTURE SURGERY Right 2015    There were no vitals filed for this visit.  Subjective Assessment - 05/02/17 1323    Subjective  Patient is pleased with her accuracy in the visual tasks    Currently in Pain?  No/denies            ADULT SLP TREATMENT - 05/02/17 0001      General Information   Behavior/Cognition  Alert;Cooperative;Pleasant mood    HPI  Per MD: Patient is a 71 y.o. female here for concussion.  Patient reports on 10/13 she was going up a ramp and lost her footing, fell forward and onto right shoulder.  Caused her to fracture nasal bone, proximal humerus.  Reports she had loss of consciousness for a short period.  Had headache for 4-5 days with ~5/10 level of pain.  Balance problems, dizziness, still feels weak and has trouble focusing.  No nausea except from the percocet she took for fractures.  No prior history of concussion.  Feels things do not look 'quite straight' but denies wavy lines, loss of vision.  No weakness, numbness in extremities.  She had Guillan  Barre in 2005 that causes shakiness in arms since then but otherwise has done well.       Treatment Provided   Treatment provided  Cognitive-Linquistic      Pain Assessment   Pain Assessment  No/denies pain      Cognitive-Linquistic Treatment   Treatment focused on  Cognition;Patient/family/caregiver education    Skilled Treatment  COGNTION: Complete Exercises 1-5 in Brainwave Visual Processing workbook with overall 90% accuracy.  Patient maintained excellent concentration to task and reports fatigue but no dizziness following 5 minutes per exercise involving visual focus.  ORGANIZATION: Patient reports that she heavily relied on her memory to insure that she attended to all tasks on her to do lists and schedule.  She is open to exploring more structured calendar/journal use to insure all her responsibilities are met.      Assessment / Recommendations / Plan   Plan  Continue with current plan of care      Progression Toward Goals   Progression toward goals  Progressing toward goals       SLP Education - 05/02/17 1324    Education provided  Yes    Education Details  Slow but accurate work is good work.  Gradually build  up mental/cognitive endurance.    Person(s) Educated  Patient    Methods  Explanation    Comprehension  Verbalized understanding         SLP Long Term Goals - 04/28/17 1310      SLP LONG TERM GOAL #1   Title  Patient will identify cognitive-communication barriers and participate in developing functional compensatory strategies.    Time  4    Period  Weeks    Status  New    Target Date  05/27/17      SLP LONG TERM GOAL #2   Title  Patient will complete complex attention, executive function, memory, and visual-spatial activities with 80% accuracy.    Time  4    Period  Weeks    Status  New    Target Date  05/27/17      SLP LONG TERM GOAL #3   Title  Patient will demonstrate functional cognitive-communication skills for successful return to work.    Time  4     Period  Weeks    Status  New    Target Date  05/27/17      SLP LONG TERM GOAL #4   Title  Patient will generate grammatical, fluent, and cogent sentence to complete abstract/complex linguistic task with 80% accuracy.    Time  4    Period  Weeks    Status  New    Target Date  05/27/17       Plan - 05/02/17 1324    Clinical Impression Statement  The patient is able to complete tedious visual processing exercises, maintaining good accuracy.  She reports fatigue but no dizziness.  The patient is flexible regarding investigating alternate self- organization tools.      Speech Therapy Frequency  2x / week    Duration  4 weeks    Treatment/Interventions  Cognitive reorganization;Internal/external aids;Compensatory strategies;SLP instruction and feedback;Patient/family education    Potential to Achieve Goals  Good    Potential Considerations  Ability to learn/carryover information;Co-morbidities;Cooperation/participation level;Medical prognosis;Previous level of function;Severity of impairments;Family/community support    SLP Home Exercise Plan  To be determined    Consulted and Agree with Plan of Care  Patient       Patient will benefit from skilled therapeutic intervention in order to improve the following deficits and impairments:   Cognitive communication deficit    Problem List Patient Active Problem List   Diagnosis Date Noted  . Concussion with loss of consciousness 04/10/2017  . 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 Encounter for screening colonoscopy 03/16/2013  . Routine general medical examination at a health care facility 03/16/2013  . Right leg weakness 01/23/2013  . History of Guillain-Barre syndrome 01/23/2013  . Radial fracture 01/23/2013  . 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 UNSPECIFIED VITAMIN D DEFICIENCY 08/26/2008  . UNSPECIFIED ANEMIA 08/26/2008  . Recurrent depressive disorder, current episode moderate ([place]) 07/29/2008  . Fatigue due to depression  07/29/2008  . WEIGHT LOSS 07/29/2008   Leroy Sea, MS/CCC- SLP  Lou Miner 05/02/2017, Nickie Retort PM  [place] MAIN [facility] SERVICES [address] [place], [place], [address] Phone: [phone]   Fax:  [phone]   Name: ZAIRE VANBUSKIRK MRN: [id number] Date of Birth: 1945-10-28

## 2017-05-02 NOTE — Therapy (Signed)
[place] MAIN [facility] SERVICES [address] [place], [place], [address] Phone: [phone]   Fax:  [phone]  Physical Therapy Treatment  Patient Details  Name: Holly [name] MRN: [id number] Date of Birth: 1945-08-06 Referring Provider: Dr. Barbaraann Barthel   Encounter Date: 05/02/2017  PT End of Session - 05/02/17 0846    Visit Number  4    Number of Visits  9    Date for PT Re-Evaluation  06/10/17    Authorization Type  no g codes/workers compensation    PT Start Time  0846    PT Stop Time  0940    PT Time Calculation (min)  54 min    Equipment Utilized During Treatment  Gait belt    Activity Tolerance  Patient tolerated treatment well    Behavior During Therapy  [facility] for tasks assessed/performed       Past Medical History:  Diagnosis Date  . Anxiety   . Depression   . Guillain Barr syndrome ([place]) 2005  . Osteoporosis   . Squamous cell skin cancer 2011    Past Surgical History:  Procedure Laterality Date  . ABDOMINAL HYSTERECTOMY  1989  . APPENDECTOMY  1960  . BREAST SURGERY  1980   biopsy  . WRIST FRACTURE SURGERY Right 2015    There were no vitals filed for this visit.  Subjective Assessment - 05/02/17 0846    Subjective  Patient states that she returns to the orthopedic MD in 2 weeks for follow-up of her shoulder. Patient states she is eager to return to work.     Pertinent History  Parts of history borrowed by MD. Patient reports on 10/13 she was going up a ramp and lost her footing, fell forward and onto right shoulder. Caused her to fracture nasal bone, proximal humerus. Pt states she bit through her bottom lip. Reports she had loss of consciousness for a short period. Head CT showed nasal fracture but no IC bleed. RUE is immobilized and being managed conservatively. She complains of "heaviness" feeling in head and eyes. She is still having intermittent headaches. She is complaining of balance problems, dizziness, still feels  weak and has trouble focusing. She complains of dizziness with quick head turns. Denies vertigo. No prior history of concussion. She had Guillan Barre in 2005 that causes shakiness in arms since then but otherwise has done well. She reports some mild RLE weakness since the [facility]. Denies any slurring of speech, difficulty, swallowing, or focal numbness/tingling/weakness since the concussion. ROS negative for red flags     Limitations  Reading    Diagnostic tests  Head CT: fractured nasal bone    Currently in Pain?  Yes    Pain Score  3     Pain Location  Shoulder    Pain Orientation  Right    Pain Descriptors / Indicators  Aching    Pain Type  Acute pain    Pain Onset  More than a month ago      Neuromuscular Re-education:  VOR X 1 exercise:  Patient performed VOR X 1 horizontal in standing 3 reps of 1 minute each with verbal cues for technique.  Patient reports mild dizziness as she states the background of the mirror is challenging.   Active eye movement between two targets: Discussed and demonstrated active eye movements between two targets exercise.  Patient performed in standing active eye movements between two targets horizontal and then vertical multiple reps of 30-60 seconds each.  Ambulation with head turns:  Patient performed 72' trials of forwards and retro ambulation with horizontal and vertical head turns with CGA. Patient with decreased cadence and step length with retro ambulation but no veering noted.   Patient reported retro ambulation with head turns was challenging for balance and created mild dizziness symptoms.   Word Scanning: On firm and then on Airex pad, performed scanning for letters written on mirror to spell out words as called out by therapist. Patient reports that it is difficult to stand on Airex pad while scanning for the letters. Noted increase sway while standing on Airex pad with feet about 2" apart.    PT Education - 05/02/17 0846     Education provided  Yes    Education Details  added head turning progression to tandem stance exercise    Person(s) Educated  Patient    Methods  Explanation;Handout;Verbal cues    Comprehension  Verbalized understanding;Returned demonstration       PT Short Term Goals - 04/15/17 1142      PT SHORT TERM GOAL #1   Title  Pt will be independent with HEP in order to improve balance in order to decrease fall risk and improve function at home and work.    Time  4    Period  Weeks    Status  New    Target Date  05/13/17        PT Long Term Goals - 04/15/17 1142      PT LONG TERM GOAL #1   Title  Pt will improve ABC by at least 13% in order to demonstrate clinically significant improvement in balance confidence.     Baseline  04/15/17: 45%    Time  8    Period  Weeks    Status  New    Target Date  06/10/17      PT LONG TERM GOAL #2   Title  Pt will decrease DHI score by at least 18 points in order to demonstrate clinically significant reduction in disability     Baseline  04/15/17: 68/100    Time  8    Status  New    Target Date  06/10/17      PT LONG TERM GOAL #3   Title  Pt will improve DGI to 24/24 in order to demonstrate improved ability to perform head turns with ambulation as well as safety with stairs    Baseline  04/15/17: 21/24    Time  8    Period  Weeks    Status  New    Target Date  06/10/17            Plan - 05/02/17 0846    Clinical Impression Statement  Patient reporting compliance with home exercise program and that she is eager to return to work. Patient did well with ambulation with head turning activities this date with mild reproduction of dizziness per patient report. Patient challenged by active eye movements between two targets exercise and will plan on repeating next visit. Patient able to complete session without rest break this date. Patient would benefit from continued PT services to further address goals and to try to improve symptoms.     Rehab  Potential  Excellent    PT Frequency  1x / week    PT Duration  8 weeks    PT Treatment/Interventions  Aquatic Therapy;ADLs/Self Care Home Management;Canalith Repostioning;Gait training;Stair training;DME Instruction;Functional mobility training;Therapeutic activities;Balance training;Therapeutic exercise;Neuromuscular re-education;Patient/family education;Manual techniques;Vestibular;Energy conservation  PT Next Visit Plan  Review VOR and progress as tolerated, attempt diagonal/random smooth pursuit, repeat active eye movements between 2 targets and consider for HEP    PT Home Exercise Plan  Seated vertical smooth pursuit, seated vertical saccades with gradually adding in conflicting background, standing balance with feet together on foam with eyes closed, VOR x 1 horizontal in sitting, semitandem balance with horizontal head turns;    Consulted and Agree with Plan of Care  Patient       Patient will benefit from skilled therapeutic intervention in order to improve the following deficits and impairments:  Decreased activity tolerance, Decreased balance, Dizziness  Visit Diagnosis: Dizziness and giddiness  Unsteadiness on feet     Problem List Patient Active Problem List   Diagnosis Date Noted  . Concussion with loss of consciousness 04/10/2017  . 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 Encounter for screening colonoscopy 03/16/2013  . Routine general medical examination at a health care facility 03/16/2013  . Right leg weakness 01/23/2013  . History of Guillain-Barre syndrome 01/23/2013  . Radial fracture 01/23/2013  . 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 UNSPECIFIED VITAMIN D DEFICIENCY 08/26/2008  . UNSPECIFIED ANEMIA 08/26/2008  . Recurrent depressive disorder, current episode moderate ([place]) 07/29/2008  . Fatigue due to depression 07/29/2008  . WEIGHT LOSS 07/29/2008   Lady Deutscher PT, DPT [phone] Lady Deutscher 05/02/2017, 10:19 AM  [place] MAIN [facility] SERVICES [address] [place], [place], [address] Phone: [phone]   Fax:  [phone]  Name: Holly [name] MRN: [id number] Date of Birth: [birth year]

## 2017-05-04 ENCOUNTER — Ambulatory Visit (INDEPENDENT_AMBULATORY_CARE_PROVIDER_SITE_OTHER): Payer: Worker's Compensation | Admitting: Family Medicine

## 2017-05-04 ENCOUNTER — Encounter: Payer: Self-pay | Admitting: Family Medicine

## 2017-05-04 VITALS — BP 152/88 | HR 72 | Ht 66.0 in | Wt 145.0 lb

## 2017-05-04 DIAGNOSIS — S060X9D Concussion with loss of consciousness of unspecified duration, subsequent encounter: Secondary | ICD-10-CM | POA: Diagnosis not present

## 2017-05-04 NOTE — Patient Instructions (Signed)
You have a concussion. Take tylenol if needed for headache. Ok to take anti-inflammatories now (ibuprofen or aleve) if needed. Try to avoid intense reading and minimize screen time. Ok for light cardio if this doesn't worsen your symptoms (walking, stationary bike). Continue vestibular and cognitive therapy. I still would like you to see a psychologist who specializes in post concussion syndrome for CBT (cognitive behavioral therapy). I would like to see you back after you see the orthopedist for your shoulder in about 2 weeks. I'm hopeful we can return you to 2-4 hour work days at that time and increase this as tolerated if you're doing well.

## 2017-05-08 ENCOUNTER — Encounter: Payer: Self-pay | Admitting: Family Medicine

## 2017-05-08 NOTE — Assessment & Plan Note (Signed)
Patient's first concussion.  She is improved clinically; testing similar to last visit though.  She will continue with cognitive therapy and physical/vestibular therapy.  She has not yet started CBT for depressive/mood symptoms from concussion - will check on this.  Tylenol if needed.  Ibuprofen/aleve ok to take if needed now (ortho was asking if this was ok).  Avoid intense reading, prolonged screen time.  Light cardio ok.  Out of work still given her symptomatology - advised to follow up with [place] in 2 weeks - I'm hopeful we can start her back on 2 hour days at least as a trial and hopefully progress from there.  Total visit time 25 minutes - > 50% of which spent on counseling.

## 2017-05-08 NOTE — Progress Notes (Signed)
PCP: Crecencio Mc, MD  Subjective:   HPI: Patient is a 71 y.o. female here for concussion.  10/25: Patient reports on 10/13 she was going up a ramp and lost her footing, fell forward and onto right shoulder. Caused her to fracture nasal bone, proximal humerus. Reports she had loss of consciousness for a short period. Had headache for 4-5 days with ~5/10 level of pain. Balance problems, dizziness, still feels weak and has trouble focusing. No nausea except from the percocet she took for fractures. No prior history of concussion. Feels things do not look 'quite straight' but denies wavy lines, loss of vision. No weakness, numbness in extremities. She had Guillan Barre in 2005 that causes shakiness in arms since then but otherwise has done well. SCAT 3 18/22 symptoms with score 82/132.  11/21: Patient reports she is doing well. Has done 2 visits of cognitive therapy and 4 visits of physical therapy.   No headache currently - last one two days ago. Still slow with concentration, difficulty expressing herself but improved. Has some dizziness, difficulty focusing at times. Waking up at night. No numbness or weakness. SCAT3 11/22 symptoms with score 33/132.  Past Medical History:  Diagnosis Date  . Anxiety   . Depression   . Guillain Barr syndrome ([place]) 2005  . Osteoporosis   . Squamous cell skin cancer 2011    Current Outpatient Medications on File Prior to Visit  Medication Sig Dispense Refill  . ARIPiprazole (ABILIFY) 5 MG tablet Take 2.5 mg by mouth daily.    . Calcium Carbonate-Vitamin D (CALCIUM 600+D HIGH POTENCY PO) Take 1 tablet by mouth daily.    . Cholecalciferol (VITAMIN D3) 2000 UNITS CHEW Chew 1 capsule by mouth daily. Reported on 08/29/2015    . clorazepate (TRANXENE) 7.5 MG tablet Take 7.5 mg by mouth 2 (two) times daily.     Marland Kitchen FLUoxetine (PROZAC) 20 MG tablet Take 20 mg by mouth daily.     . Levomilnacipran HCl ER (FETZIMA) 20 MG CP24 Take 20 mg by mouth.     . oxyCODONE-acetaminophen (ROXICET) 5-325 MG tablet Take 1-2 tablets by mouth every 6 (six) hours as needed. (Patient not taking: Reported on 04/15/2017) 12 tablet 0  . tamsulosin (FLOMAX) 0.4 MG CAPS capsule Take 0.4 mg by mouth daily.  11  . temazepam (RESTORIL) 30 MG capsule Take 30 mg by mouth at bedtime as needed for sleep.    . traMADol (ULTRAM) 50 MG tablet Take by mouth every 6 (six) hours as needed.     No current facility-administered medications on file prior to visit.     Past Surgical History:  Procedure Laterality Date  . ABDOMINAL HYSTERECTOMY  1989  . APPENDECTOMY  1960  . BREAST SURGERY  1980   biopsy  . WRIST FRACTURE SURGERY Right 2015    Allergies  Allergen Reactions  . Oxycodone-Acetaminophen Nausea And Vomiting  . Cephalexin     Other reaction(s): HIVES    Social History   Socioeconomic History  . Marital status: Widowed    Spouse name: Not on file  . Number of children: Not on file  . Years of education: Not on file  . Highest education level: Not on file  Social Needs  . Financial resource strain: Not on file  . Food insecurity - worry: Not on file  . Food insecurity - inability: Not on file  . Transportation needs - medical: Not on file  . Transportation needs - non-medical: Not on file  Occupational History  . Not on file  Tobacco Use  . Smoking status: Former Smoker    Packs/day: 0.20    Types: Cigarettes    Last attempt to quit: 06/14/1977    Years since quitting: 39.9  . Smokeless tobacco: Never Used  Substance and Sexual Activity  . Alcohol use: Yes    Alcohol/week: 0.0 oz    Comment: social (about once a month)  . Drug use: No  . Sexual activity: No  Other Topics Concern  . Not on file  Social History Narrative  . Not on file    Family History  Problem Relation Age of Onset  . Cancer Mother   . Mental illness Sister   . Bipolar disorder Sister   . Depression Brother   . Mental illness Brother   . Parkinson's disease  Brother     BP (!) 152/88   Pulse 72   Ht 5\' 6"  (1.676 m)   Wt 145 lb (65.8 kg)   BMI 23.40 kg/m   Review of Systems: See HPI above.     Objective:  Physical Exam:  Gen: NAD, comfortable in exam room.  Neuro:  Orientation 4/5 (date) Immediate memory 15/15 Concentration 4/5 (6 digits backwards) Neck FROM without pain. Balance deferred today. Coordination normal finger to nose Delayed recall 3/5   Assessment & Plan:  1. Concussion with loss of consciousness - Patient's first concussion.  She is improved clinically; testing similar to last visit though.  She will continue with cognitive therapy and physical/vestibular therapy.  She has not yet started CBT for depressive/mood symptoms from concussion - will check on this.  Tylenol if needed.  Ibuprofen/aleve ok to take if needed now (ortho was asking if this was ok).  Avoid intense reading, prolonged screen time.  Light cardio ok.  Out of work still given her symptomatology - advised to follow up with [place] in 2 weeks - I'm hopeful we can start her back on 2 hour days at least as a trial and hopefully progress from there.  Total visit time 25 minutes - > 50% of which spent on counseling.

## 2017-05-09 NOTE — Telephone Encounter (Signed)
Request for Prolia.

## 2017-05-09 NOTE — Telephone Encounter (Signed)
Patient checking status of prolia injection. Please advise

## 2017-05-10 ENCOUNTER — Ambulatory Visit: Payer: PRIVATE HEALTH INSURANCE | Admitting: Speech Pathology

## 2017-05-10 ENCOUNTER — Encounter: Payer: Self-pay | Admitting: Speech Pathology

## 2017-05-10 DIAGNOSIS — R41841 Cognitive communication deficit: Secondary | ICD-10-CM

## 2017-05-10 DIAGNOSIS — R42 Dizziness and giddiness: Secondary | ICD-10-CM | POA: Diagnosis not present

## 2017-05-10 NOTE — Therapy (Signed)
[place] MAIN [facility] SERVICES [address] [place], [place], [address] Phone: [phone]   Fax:  [phone]  Speech Language Pathology Treatment  Patient Details  Name: Holly [name] MRN: [id number] Date of Birth: April 20, 1946 Referring Provider: Dene Gentry    Encounter Date: 05/10/2017  End of Session - 05/10/17 1556    Visit Number  3    Number of Visits  9    Date for SLP Re-Evaluation  05/27/17    SLP Start Time  3500    SLP Stop Time   1545    SLP Time Calculation (min)  49 min    Activity Tolerance  Patient tolerated treatment well       Past Medical History:  Diagnosis Date  . Anxiety   . Depression   . Guillain Barr syndrome ([place]) 2005  . Osteoporosis   . Squamous cell skin cancer 2011    Past Surgical History:  Procedure Laterality Date  . ABDOMINAL HYSTERECTOMY  1989  . APPENDECTOMY  1960  . BREAST SURGERY  1980   biopsy  . WRIST FRACTURE SURGERY Right 2015    There were no vitals filed for this visit.  Subjective Assessment - 05/10/17 1555    Subjective  Patient commented she was feeling a bit low some days     Currently in Pain?  No/denies            ADULT SLP TREATMENT - 05/10/17 0001      General Information   Behavior/Cognition  Alert;Cooperative;Pleasant mood    HPI  Per MD: Patient is a 71 y.o. female here for concussion.  Patient reports on 10/13 she was going up a ramp and lost her footing, fell forward and onto right shoulder.  Caused her to fracture nasal bone, proximal humerus.  Reports she had loss of consciousness for a short period.  Had headache for 4-5 days with ~5/10 level of pain.  Balance problems, dizziness, still feels weak and has trouble focusing.  No nausea except from the percocet she took for fractures.  No prior history of concussion.  Feels things do not look 'quite straight' but denies wavy lines, loss of vision.  No weakness, numbness in extremities.  She had Guillan  Barre in 2005 that causes shakiness in arms since then but otherwise has done well.       Treatment Provided   Treatment provided  Cognitive-Linquistic      Pain Assessment   Pain Assessment  No/denies pain      Cognitive-Linquistic Treatment   Treatment focused on  Cognition;Patient/family/caregiver education    Skilled Treatment  COGNTION: Complete Exercises 1-5 in Brainwave Attention Processing workbook with overall 90% accuracy.  Patient maintained excellent concentration to task and reports fatigue but no dizziness.  ORGANIZATION: Exemplified and discussed a notebook/calendar for organizing      Assessment / Recommendations / [place] with current plan of care      Progression Toward Goals   Progression toward goals  Progressing toward goals       SLP Education - 05/10/17 1556    Education provided  Yes    Education Details  organizing tools    Person(s) Educated  Patient    Methods  Explanation    Comprehension  Verbalized understanding         SLP Long Term Goals - 04/28/17 1310      SLP LONG TERM GOAL #1   Title  Patient will identify cognitive-communication barriers and participate in developing functional compensatory strategies.    Time  4    Period  Weeks    Status  New    Target Date  05/27/17      SLP LONG TERM GOAL #2   Title  Patient will complete complex attention, executive function, memory, and visual-spatial activities with 80% accuracy.    Time  4    Period  Weeks    Status  New    Target Date  05/27/17      SLP LONG TERM GOAL #3   Title  Patient will demonstrate functional cognitive-communication skills for successful return to work.    Time  4    Period  Weeks    Status  New    Target Date  05/27/17      SLP LONG TERM GOAL #4   Title  Patient will generate grammatical, fluent, and cogent sentence to complete abstract/complex linguistic task with 80% accuracy.    Time  4    Period  Weeks    Status  New    Target Date   05/27/17       Plan - 05/10/17 1556    Clinical Impression Statement  The patient is able to complete tedious attention processing exercises, maintaining good accuracy.  She reports fatigue but no dizziness.  The patient liked the organizing tools presented.        Speech Therapy Frequency  2x / week    Duration  4 weeks    Treatment/Interventions  Cognitive reorganization;Internal/external aids;Compensatory strategies;SLP instruction and feedback;Patient/family education    Potential to Achieve Goals  Good    Potential Considerations  Ability to learn/carryover information;Co-morbidities;Cooperation/participation level;Medical prognosis;Previous level of function;Severity of impairments;Family/community support    SLP Home Exercise Plan  perplexor    Consulted and Agree with Plan of Care  Patient       Patient will benefit from skilled therapeutic intervention in order to improve the following deficits and impairments:   Cognitive communication deficit    Problem List Patient Active Problem List   Diagnosis Date Noted  . Closed fracture of upper end of humerus 04/25/2017  . Concussion with loss of consciousness 04/10/2017  . 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 Encounter for screening colonoscopy 03/16/2013  . Routine general medical examination at a health care facility 03/16/2013  . Right leg weakness 01/23/2013  . History of Guillain-Barre syndrome 01/23/2013  . Radial fracture 01/23/2013  . 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 UNSPECIFIED VITAMIN D DEFICIENCY 08/26/2008  . UNSPECIFIED ANEMIA 08/26/2008  . Recurrent depressive disorder, current episode moderate ([place]) 07/29/2008  . Fatigue due to depression 07/29/2008  . WEIGHT LOSS 07/29/2008    Ariellah Faust [place] 05/10/2017, 3:57 PM  [place] MAIN [facility] SERVICES [address] [place], [place], [address] Phone: [phone]   Fax:  [phone]   Name: JUDITHANN VILLAMAR MRN:  [id number] Date of Birth: [birth year]

## 2017-05-13 ENCOUNTER — Ambulatory Visit: Payer: PRIVATE HEALTH INSURANCE

## 2017-05-13 ENCOUNTER — Other Ambulatory Visit: Payer: Self-pay

## 2017-05-13 ENCOUNTER — Ambulatory Visit: Payer: PRIVATE HEALTH INSURANCE | Admitting: Speech Pathology

## 2017-05-13 ENCOUNTER — Encounter: Payer: Self-pay | Admitting: Speech Pathology

## 2017-05-13 VITALS — BP 146/78 | HR 74

## 2017-05-13 DIAGNOSIS — R42 Dizziness and giddiness: Secondary | ICD-10-CM | POA: Diagnosis not present

## 2017-05-13 DIAGNOSIS — R41841 Cognitive communication deficit: Secondary | ICD-10-CM

## 2017-05-13 DIAGNOSIS — R2681 Unsteadiness on feet: Secondary | ICD-10-CM

## 2017-05-13 NOTE — Therapy (Signed)
[place] MAIN [facility] SERVICES [address] [place], [place], [address] Phone: [phone]   Fax:  [phone]  Physical Therapy Treatment  Patient Details  Name: Holly [name] MRN: [id number] Date of Birth: Apr 04, 1946 Referring Provider: Dr. Barbaraann Barthel   Encounter Date: 05/13/2017  PT End of Session - 05/13/17 0906    Visit Number  5    Number of Visits  9    Date for PT Re-Evaluation  06/10/17    Authorization Type  no g codes/workers compensation    PT Start Time  0908    PT Stop Time  0952    PT Time Calculation (min)  44 min    Equipment Utilized During Treatment  Gait belt    Activity Tolerance  Patient tolerated treatment well    Behavior During Therapy  [facility] for tasks assessed/performed       Past Medical History:  Diagnosis Date  . Anxiety   . Depression   . Guillain Barr syndrome ([place]) 2005  . Osteoporosis   . Squamous cell skin cancer 2011    Past Surgical History:  Procedure Laterality Date  . ABDOMINAL HYSTERECTOMY  1989  . APPENDECTOMY  1960  . BREAST SURGERY  1980   biopsy  . WRIST FRACTURE SURGERY Right 2015    Vitals:   05/13/17 0911  BP: (!) 146/78  Pulse: 74    Subjective Assessment - 05/13/17 0905    Subjective  Pt reports that she is is having some increased RUE pain last night and this morning. Pain is improved this morning. She reports improving symptoms of dizziness and imbalance since starting therapy. She will be following up with her MD regarding her RUE fracture and is hoping to return to work on a very limited basis.     Pertinent History  Parts of history borrowed by MD. Patient reports on 10/13 she was going up a ramp and lost her footing, fell forward and onto right shoulder. Caused her to fracture nasal bone, proximal humerus. Pt states she bit through her bottom lip. Reports she had loss of consciousness for a short period. Head CT showed nasal fracture but no IC bleed. RUE is immobilized and  being managed conservatively. She complains of "heaviness" feeling in head and eyes. She is still having intermittent headaches. She is complaining of balance problems, dizziness, still feels weak and has trouble focusing. She complains of dizziness with quick head turns. Denies vertigo. No prior history of concussion. She had Guillan Barre in 2005 that causes shakiness in arms since then but otherwise has done well. She reports some mild RLE weakness since the [facility] Denies any slurring of speech, difficulty, swallowing, or focal numbness/tingling/weakness since the concussion. ROS negative for red flags     Limitations  Reading    Diagnostic tests  Head CT: fractured nasal bone    Currently in Pain?  Yes    Pain Score  4     Pain Location  Shoulder    Pain Orientation  Right    Pain Descriptors / Indicators  Aching    Pain Type  Acute pain    Pain Onset  More than a month ago    Pain Frequency  Intermittent        TREATMENT   Neuromuscular Re-education:  VOR X 1 exercise:  Patient performed VOR X 1 horizontal in standing 3 reps of 1 minute each with verbal cues for technique, conflicting background. Patient reports 4/10 dizziness  initially but improves with each repetition  Active eye movement between two targets: Patient performed in sitting and then standing active eye movements between two targets diagonal then vertical multiple reps of 30-60 seconds each with feet apart and then together.   Airex Semitandem on Airex with alternating foot positions performing horizontal and then vertical head turns, pt with notable sway especially with head turning to the left;  Ambulation with head turns:  Patient performed 16' trials of forwards and retro ambulation with horizontal and diagonal head turns to reach playing cards on wall;vertical head turns with CGA. Patient with decreased cadence and step length with retro ambulation;  Patient reported retro ambulation with head turns was  challenging for balance and created mild dizziness symptoms. Pt struggles with dual tasking;  Quick Turns Performed ambulation with quick 180 degree turns left only first, then right only, and finally alternating. No imbalance noted and pt denies increase in her symptoms.                        PT Education - 05/13/17 0905    Education provided  Yes    Education Details  HEP and plan of care    Person(s) Educated  Patient    Methods  Explanation    Comprehension  Verbalized understanding       PT Short Term Goals - 04/15/17 1142      PT SHORT TERM GOAL #1   Title  Pt will be independent with HEP in order to improve balance in order to decrease fall risk and improve function at home and work.    Time  4    Period  Weeks    Status  New    Target Date  05/13/17        PT Long Term Goals - 04/15/17 1142      PT LONG TERM GOAL #1   Title  Pt will improve ABC by at least 13% in order to demonstrate clinically significant improvement in balance confidence.     Baseline  04/15/17: 45%    Time  8    Period  Weeks    Status  New    Target Date  06/10/17      PT LONG TERM GOAL #2   Title  Pt will decrease DHI score by at least 18 points in order to demonstrate clinically significant reduction in disability     Baseline  04/15/17: 68/100    Time  8    Status  New    Target Date  06/10/17      PT LONG TERM GOAL #3   Title  Pt will improve DGI to 24/24 in order to demonstrate improved ability to perform head turns with ambulation as well as safety with stairs    Baseline  04/15/17: 21/24    Time  8    Period  Weeks    Status  New    Target Date  06/10/17            Plan - 05/13/17 0906    Clinical Impression Statement  Pt reports that she feels like her dizziness and imbalance has improved approximately 70-80% since her concussion. She still struggles most with eyes closed activites. Discussed progressions of home exercise program that she can perform  to make her exercises more/less challenging. Will continue to advance her exercises here during sessions and hope to attempt some head turning activities in the busy environments to simulate  return to work activities.     Clinical Presentation  Unstable    Clinical Decision Making  High    Rehab Potential  Excellent    PT Frequency  1x / week    PT Duration  8 weeks    PT Treatment/Interventions  Aquatic Therapy;ADLs/Self Care Home Management;Canalith Repostioning;Gait training;Stair training;DME Instruction;Functional mobility training;Therapeutic activities;Balance training;Therapeutic exercise;Neuromuscular re-education;Patient/family education;Manual techniques;Vestibular;Energy conservation    PT Next Visit Plan  Progress head turning activities with forward/retro ambulation, eyes closed activities, body rolls if able with RUE immoblized, ambulation in medical mall/stairs    PT Home Exercise Plan  Seated vertical smooth pursuit, seated vertical saccades with gradually adding in conflicting background, standing balance with feet together on foam with eyes closed, VOR x 1 horizontal in sitting, semitandem balance with horizontal head turns;    Consulted and Agree with Plan of Care  Patient       Patient will benefit from skilled therapeutic intervention in order to improve the following deficits and impairments:  Decreased activity tolerance, Decreased balance, Dizziness  Visit Diagnosis: Dizziness and giddiness  Unsteadiness on feet     Problem List Patient Active Problem List   Diagnosis Date Noted  . Closed fracture of upper end of humerus 04/25/2017  . Concussion with loss of consciousness 04/10/2017  . 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 Encounter for screening colonoscopy 03/16/2013  . Routine general medical examination at a health care facility 03/16/2013  . Right leg weakness 01/23/2013  . History of Guillain-Barre syndrome 01/23/2013  . Radial fracture 01/23/2013  .  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 UNSPECIFIED VITAMIN D DEFICIENCY 08/26/2008  . UNSPECIFIED ANEMIA 08/26/2008  . Recurrent depressive disorder, current episode moderate ([place]) 07/29/2008  . Fatigue due to depression 07/29/2008  . WEIGHT LOSS 07/29/2008   Phillips Grout PT, DPT   Alajah Witman 05/13/2017, 10:24 AM  [place] MAIN [facility] SERVICES [address] [place], [place], [address] Phone: [phone]   Fax:  [phone]  Name: TIFFANNIE SLOSS MRN: [id number] Date of Birth: 02-19-1946

## 2017-05-13 NOTE — Therapy (Signed)
[place] MAIN [facility] SERVICES [address] [place], [place], [address] Phone: [phone]   Fax:  [phone]  Speech Language Pathology Treatment  Patient Details  Name: Holly [name] MRN: [id number] Date of Birth: [birth year] Referring Provider: Dene Gentry    Encounter Date: 05/13/2017  End of Session - 05/13/17 1235    Visit Number  4    Number of Visits  9    Date for SLP Re-Evaluation  05/27/17    SLP Start Time  1000    SLP Stop Time   1052    SLP Time Calculation (min)  52 min    Activity Tolerance  Patient tolerated treatment well       Past Medical History:  Diagnosis Date  . Anxiety   . Depression   . Guillain Barr syndrome ([place]) 2005  . Osteoporosis   . Squamous cell skin cancer 2011    Past Surgical History:  Procedure Laterality Date  . ABDOMINAL HYSTERECTOMY  1989  . APPENDECTOMY  1960  . BREAST SURGERY  1980   biopsy  . WRIST FRACTURE SURGERY Right 2015    There were no vitals filed for this visit.  Subjective Assessment - 05/13/17 1235    Subjective  Patient stated she was much more tired today and said she was "spacing out" during activities, which she did not feel she did in previous sessions    Currently in Pain?  No/denies            ADULT SLP TREATMENT - 05/13/17 0001      General Information   Behavior/Cognition  Alert;Cooperative;Pleasant mood    HPI  Per MD: Patient is a 71 y.o. female here for concussion.  Patient reports on 10/13 she was going up a ramp and lost her footing, fell forward and onto right shoulder.  Caused her to fracture nasal bone, proximal humerus.  Reports she had loss of consciousness for a short period.  Had headache for 4-5 days with ~5/10 level of pain.  Balance problems, dizziness, still feels weak and has trouble focusing.  No nausea except from the percocet she took for fractures.  No prior history of concussion.  Feels things do not look 'quite straight' but  denies wavy lines, loss of vision.  No weakness, numbness in extremities.  She had Guillan Barre in 2005 that causes shakiness in arms since then but otherwise has done well.       Treatment Provided   Treatment provided  Cognitive-Linquistic      Pain Assessment   Pain Assessment  No/denies pain      Cognitive-Linquistic Treatment   Treatment focused on  Cognition;Patient/family/caregiver education    Skilled Treatment  COGNTION: Complete Exercises 1-5 in Brainwave Attention Processing workbook with overall 86% accuracy. Patient reported "spacing out" and feeling more tired today. Reviewed perplexor sent home-incomplete but accurate in completed areas.      Assessment / Recommendations / Plan   Plan  Continue with current plan of care      Progression Toward Goals   Progression toward goals  Progressing toward goals       SLP Education - 05/13/17 1235    Education provided  Yes    Education Details  challenge yourself without overdoing it     Person(s) Educated  Patient    Methods  Explanation    Comprehension  Verbalized understanding         SLP Long Term Goals - 04/28/17  [place] #1   Title  Patient will identify cognitive-communication barriers and participate in developing functional compensatory strategies.    Time  4    Period  Weeks    Status  New    Target Date  05/27/17      SLP LONG TERM GOAL #2   Title  Patient will complete complex attention, executive function, memory, and visual-spatial activities with 80% accuracy.    Time  4    Period  Weeks    Status  New    Target Date  05/27/17      SLP LONG TERM GOAL #3   Title  Patient will demonstrate functional cognitive-communication skills for successful return to work.    Time  4    Period  Weeks    Status  New    Target Date  05/27/17      SLP LONG TERM GOAL #4   Title  Patient will generate grammatical, fluent, and cogent sentence to complete abstract/complex linguistic task with  80% accuracy.    Time  4    Period  Weeks    Status  New    Target Date  05/27/17       Plan - 05/13/17 1236    Clinical Impression Statement   The patient is able to complete tedious attention processing exercises, maintaining good accuracy.  She reports fatigue and "spacing out" but no dizziness. 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     Speech Therapy Frequency  2x / week    Duration  4 weeks    Treatment/Interventions  Cognitive reorganization;Internal/external aids;Compensatory strategies;SLP instruction and feedback;Patient/family education    Potential to Achieve Goals  Good    Potential Considerations  Ability to learn/carryover information;Co-morbidities;Cooperation/participation level;Medical prognosis;Previous level of function;Severity of impairments;Family/community support    SLP Home Exercise Plan  new perplexor    Consulted and Agree with Plan of Care  Patient       Patient will benefit from skilled therapeutic intervention in order to improve the following deficits and impairments:   Cognitive communication deficit    Problem List Patient Active Problem List   Diagnosis Date Noted  . Closed fracture of upper end of humerus 04/25/2017  . Concussion with loss of consciousness 04/10/2017  . 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 Encounter for screening colonoscopy 03/16/2013  . Routine general medical examination at a health care facility 03/16/2013  . Right leg weakness 01/23/2013  . History of Guillain-Barre syndrome 01/23/2013  . Radial fracture 01/23/2013  . 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 UNSPECIFIED VITAMIN D DEFICIENCY 08/26/2008  . UNSPECIFIED ANEMIA 08/26/2008  . Recurrent depressive disorder, current episode moderate ([place]) 07/29/2008  . Fatigue due to depression 07/29/2008  . WEIGHT LOSS 07/29/2008    Deangelo Berns [place] 05/13/2017, 12:36 PM  [place] MAIN  [facility] SERVICES [address] [place], [place], [address] Phone: [phone]   Fax:  [phone]   Name: Holly [name] MRN: [id number] Date of Birth: 09/30/1945

## 2017-05-16 NOTE — Telephone Encounter (Signed)
Please advise 

## 2017-05-17 ENCOUNTER — Encounter: Payer: Self-pay | Admitting: Speech Pathology

## 2017-05-17 ENCOUNTER — Ambulatory Visit: Payer: PRIVATE HEALTH INSURANCE | Attending: Family Medicine | Admitting: Speech Pathology

## 2017-05-17 DIAGNOSIS — R41841 Cognitive communication deficit: Secondary | ICD-10-CM | POA: Insufficient documentation

## 2017-05-17 DIAGNOSIS — M25611 Stiffness of right shoulder, not elsewhere classified: Secondary | ICD-10-CM | POA: Insufficient documentation

## 2017-05-17 DIAGNOSIS — M6281 Muscle weakness (generalized): Secondary | ICD-10-CM | POA: Diagnosis present

## 2017-05-17 DIAGNOSIS — R2681 Unsteadiness on feet: Secondary | ICD-10-CM | POA: Diagnosis present

## 2017-05-17 DIAGNOSIS — R42 Dizziness and giddiness: Secondary | ICD-10-CM | POA: Diagnosis present

## 2017-05-17 DIAGNOSIS — M25511 Pain in right shoulder: Secondary | ICD-10-CM | POA: Insufficient documentation

## 2017-05-17 NOTE — Therapy (Signed)
[place] MAIN [facility] SERVICES [address] [place], [place], [address] Phone: [phone]   Fax:  [phone]  Speech Language Pathology Treatment  Patient Details  Name: Holly [name] MRN: [id number] Date of Birth: 02-25-1946 Referring Provider: Dene Gentry    Encounter Date: 05/17/2017  End of Session - 05/17/17 1513    Visit Number  5    Number of Visits  9    Date for SLP Re-Evaluation  05/27/17    SLP Start Time  1300    SLP Stop Time   1355    SLP Time Calculation (min)  55 min    Activity Tolerance  Patient tolerated treatment well       Past Medical History:  Diagnosis Date  . Anxiety   . Depression   . Guillain Barr syndrome ([place]) 2005  . Osteoporosis   . Squamous cell skin cancer 2011    Past Surgical History:  Procedure Laterality Date  . ABDOMINAL HYSTERECTOMY  1989  . APPENDECTOMY  1960  . BREAST SURGERY  1980   biopsy  . WRIST FRACTURE SURGERY Right 2015    There were no vitals filed for this visit.  Subjective Assessment - 05/17/17 1512    Subjective  The patient commented that she felt dizzy when she arrived and tired after activity    Currently in Pain?  No/denies            ADULT SLP TREATMENT - 05/17/17 0001      General Information   Behavior/Cognition  Alert;Cooperative;Pleasant mood    HPI  Per MD: Patient is a 71 y.o. female here for concussion.  Patient reports on 10/13 she was going up a ramp and lost her footing, fell forward and onto right shoulder.  Caused her to fracture nasal bone, proximal humerus.  Reports she had loss of consciousness for a short period.  Had headache for 4-5 days with ~5/10 level of pain.  Balance problems, dizziness, still feels weak and has trouble focusing.  No nausea except from the percocet she took for fractures.  No prior history of concussion.  Feels things do not look 'quite straight' but denies wavy lines, loss of vision.  No weakness, numbness in  extremities.  She had Guillan Barre in 2005 that causes shakiness in arms since then but otherwise has done well.       Treatment Provided   Treatment provided  Cognitive-Linquistic      Pain Assessment   Pain Assessment  No/denies pain      Cognitive-Linquistic Treatment   Treatment focused on  Cognition;Patient/family/caregiver education    Skilled Treatment  COGNTION: Complete Exercises 1-5 in Brainwave Attention Processing workbook with overall 90% accuracy. Patient reported "spacing out" and feeling more tired today. Reviewed perplexor sent home-incomplete but accurate in completed areas. FUNCTIONAL REPORT: Patient reports feeling dizzy still when she is rushed; felt good when she woke up this morning but as day went on and she became busier she felt dizzy and clouded. Reported feeling tired and saying her eyes were tired at the end of session today.       Assessment / Recommendations / Plan   Plan  Continue with current plan of care      Progression Toward Goals   Progression toward goals  Progressing toward goals       SLP Education - 05/17/17 1513    Education provided  Yes    Education Details  test limits without  going too far     Person(s) Educated  Patient    Methods  Explanation    Comprehension  Verbalized understanding         SLP Long Term Goals - 04/28/17 1310      SLP LONG TERM GOAL #1   Title  Patient will identify cognitive-communication barriers and participate in developing functional compensatory strategies.    Time  4    Period  Weeks    Status  New    Target Date  05/27/17      SLP LONG TERM GOAL #2   Title  Patient will complete complex attention, executive function, memory, and visual-spatial activities with 80% accuracy.    Time  4    Period  Weeks    Status  New    Target Date  05/27/17      SLP LONG TERM GOAL #3   Title  Patient will demonstrate functional cognitive-communication skills for successful return to work.    Time  4    Period   Weeks    Status  New    Target Date  05/27/17      SLP LONG TERM GOAL #4   Title  Patient will generate grammatical, fluent, and cogent sentence to complete abstract/complex linguistic task with 80% accuracy.    Time  4    Period  Weeks    Status  New    Target Date  05/27/17       Plan - 05/17/17 1514    Clinical Impression Statement  The patient is able to complete tedious attention processing exercises, maintaining good accuracy.  She reports fatigue and "spacing out", and dizziness when busy or rushed. She noted being afraid of walking too fast and falling from being dizzy.      Speech Therapy Frequency  2x / week    Duration  4 weeks    Treatment/Interventions  Cognitive reorganization;Internal/external aids;Compensatory strategies;SLP instruction and feedback;Patient/family education    Potential to Achieve Goals  Good    Potential Considerations  Ability to learn/carryover information;Co-morbidities;Cooperation/participation level;Medical prognosis;Previous level of function;Severity of impairments;Family/community support    SLP Home Exercise Plan  new perplexor    Consulted and Agree with Plan of Care  Patient       Patient will benefit from skilled therapeutic intervention in order to improve the following deficits and impairments:   Cognitive communication deficit    Problem List Patient Active Problem List   Diagnosis Date Noted  . Closed fracture of upper end of humerus 04/25/2017  . Concussion with loss of consciousness 04/10/2017  . 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 Encounter for screening colonoscopy 03/16/2013  . Routine general medical examination at a health care facility 03/16/2013  . Right leg weakness 01/23/2013  . History of Guillain-Barre syndrome 01/23/2013  . Radial fracture 01/23/2013  . 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 UNSPECIFIED VITAMIN D DEFICIENCY 08/26/2008  . UNSPECIFIED ANEMIA 08/26/2008  . Recurrent depressive disorder, current  episode moderate ([place]) 07/29/2008  . Fatigue due to depression 07/29/2008  . WEIGHT LOSS 07/29/2008    Holly [name] [place] 05/17/2017, 3:14 PM  [place] MAIN [facility] SERVICES [address] [place], [place], [address] Phone: [phone]   Fax:  [phone]   Name: RAVNEET SPILKER MRN: [id number] Date of Birth: January 04, 1946

## 2017-05-17 NOTE — Telephone Encounter (Signed)
Prolia injection has been scheduled.

## 2017-05-18 ENCOUNTER — Telehealth: Payer: Self-pay | Admitting: *Deleted

## 2017-05-18 ENCOUNTER — Encounter: Payer: Self-pay | Admitting: Family Medicine

## 2017-05-18 ENCOUNTER — Ambulatory Visit (INDEPENDENT_AMBULATORY_CARE_PROVIDER_SITE_OTHER): Payer: PRIVATE HEALTH INSURANCE | Admitting: Family Medicine

## 2017-05-18 DIAGNOSIS — S060X9D Concussion with loss of consciousness of unspecified duration, subsequent encounter: Secondary | ICD-10-CM

## 2017-05-18 NOTE — Telephone Encounter (Signed)
Patient has been scheduled for injection

## 2017-05-18 NOTE — Patient Instructions (Addendum)
You have a concussion. Take tylenol if needed for headache. Ibuprofen or aleve only if needed beyond the tylenol. Try to avoid intense reading and minimize screen time. Ok for light cardio if this doesn't worsen your symptoms (walking, stationary bike). Continue vestibular and cognitive therapy. I think it's ok to cancel the CBT with psychology - you're doing well from this perspective. Follow up with me after christmas (31st if possible) for reevaluation.

## 2017-05-18 NOTE — Telephone Encounter (Signed)
Copied from [place] #[id number]. Topic: Inquiry >> Apr 21, 2017  9:03 AM Darl Householder, RMA wrote: Reason for CRM: patient is requesting a Prolia injection, please advise pt once insurance is verified.   >> May 16, 2017  8:20 AM Yvette Rack wrote: Patient is calling back about her Prolia Injection she states that it is really important that she have this medicine she would like for someone to call her about this when its done

## 2017-05-19 ENCOUNTER — Encounter: Payer: Self-pay | Admitting: Family Medicine

## 2017-05-19 NOTE — Progress Notes (Signed)
PCP: Crecencio Mc, MD  Subjective:   HPI: Patient is a 71 y.o. female here for concussion.  10/25: Patient reports on 10/13 she was going up a ramp and lost her footing, fell forward and onto right shoulder. Caused her to fracture nasal bone, proximal humerus. Reports she had loss of consciousness for a short period. Had headache for 4-5 days with ~5/10 level of pain. Balance problems, dizziness, still feels weak and has trouble focusing. No nausea except from the percocet she took for fractures. No prior history of concussion. Feels things do not look 'quite straight' but denies wavy lines, loss of vision. No weakness, numbness in extremities. She had Guillan Barre in 2005 that causes shakiness in arms since then but otherwise has done well. SCAT 3 18/22 symptoms with score 82/132.  11/21: Patient reports she is doing well. Has done 2 visits of cognitive therapy and 4 visits of physical therapy.   No headache currently - last one two days ago. Still slow with concentration, difficulty expressing herself but improved. Has some dizziness, difficulty focusing at times. Waking up at night. No numbness or weakness. SCAT3 11/22 symptoms with score 33/132.  12/5: Patient reports she is improved since last visit. Has been to 5 visits of cognitive therapy, 6 visits of PT/vestibular rehab. Doing home exercises also. Finds the dizziness is the most frustrating but she has trouble with multitasking, concentrating, and gets tired faster. SCAT 3 9/22 symptoms, severity 16/132.  Past Medical History:  Diagnosis Date  . Anxiety   . Depression   . Guillain Barr syndrome ([place]) 2005  . Osteoporosis   . Squamous cell skin cancer 2011    Current Outpatient Medications on File Prior to Visit  Medication Sig Dispense Refill  . ARIPiprazole (ABILIFY) 5 MG tablet Take 2.5 mg by mouth daily.    . Calcium Carbonate-Vitamin D (CALCIUM 600+D HIGH POTENCY PO) Take 1 tablet by mouth daily.     . Cholecalciferol (VITAMIN D3) 2000 UNITS CHEW Chew 1 capsule by mouth daily. Reported on 08/29/2015    . clorazepate (TRANXENE) 7.5 MG tablet Take 7.5 mg by mouth 2 (two) times daily.     Marland Kitchen FLUoxetine (PROZAC) 20 MG tablet Take 20 mg by mouth daily.     . Levomilnacipran HCl ER (FETZIMA) 20 MG CP24 Take 20 mg by mouth.    . oxyCODONE-acetaminophen (ROXICET) 5-325 MG tablet Take 1-2 tablets by mouth every 6 (six) hours as needed. (Patient not taking: Reported on 04/15/2017) 12 tablet 0  . tamsulosin (FLOMAX) 0.4 MG CAPS capsule Take 0.4 mg by mouth daily.  11  . temazepam (RESTORIL) 30 MG capsule Take 30 mg by mouth at bedtime as needed for sleep.    . traMADol (ULTRAM) 50 MG tablet Take by mouth every 6 (six) hours as needed.     No current facility-administered medications on file prior to visit.     Past Surgical History:  Procedure Laterality Date  . ABDOMINAL HYSTERECTOMY  1989  . APPENDECTOMY  1960  . BREAST SURGERY  1980   biopsy  . WRIST FRACTURE SURGERY Right 2015    Allergies  Allergen Reactions  . Oxycodone-Acetaminophen Nausea And Vomiting  . Cephalexin     Other reaction(s): HIVES    Social History   Socioeconomic History  . Marital status: Widowed    Spouse name: Not on file  . Number of children: Not on file  . Years of education: Not on file  . Highest education level:  Not on file  Social Needs  . Financial resource strain: Not on file  . Food insecurity - worry: Not on file  . Food insecurity - inability: Not on file  . Transportation needs - medical: Not on file  . Transportation needs - non-medical: Not on file  Occupational History  . Not on file  Tobacco Use  . Smoking status: Former Smoker    Packs/day: 0.20    Types: Cigarettes    Last attempt to quit: 06/14/1977    Years since quitting: 39.9  . Smokeless tobacco: Never Used  Substance and Sexual Activity  . Alcohol use: Yes    Alcohol/week: 0.0 oz    Comment: social (about once a month)  .  Drug use: No  . Sexual activity: No  Other Topics Concern  . Not on file  Social History Narrative  . Not on file    Family History  Problem Relation Age of Onset  . Cancer Mother   . Mental illness Sister   . Bipolar disorder Sister   . Depression Brother   . Mental illness Brother   . Parkinson's disease Brother     BP (!) 151/85   Pulse 67   Ht 5\' 6"  (1.676 m)   Wt 145 lb (65.8 kg)   BMI 23.40 kg/m   Review of Systems: See HPI above.     Objective:  Physical Exam:  Gen: NAD, comfortable in exam room.  Neuro: Orientation 5/5 Immediate memory 12/15 Concentration 3/5 (5, 6 digits backwards) Neck FROM without pain. Balance 0 errors double leg, 2 errors tandem, unsteady with single leg Coordination normal finger to nose. Delayed recall 3/5. Horizontal saccades 30/30 trials without symptoms. Vertical 30/30 trials without symptoms. Fixed gaze completed 30 trials but with a little dizziness at end.   Assessment & Plan:  1. Concussion with loss of consciousness - Patient's first concussion.  She continues to improve but not to the point where she could return to work unfortunately.  She will continue with cognitive therapy and PT/vestibular therapy.  Her mood symptoms are much better - advised to hold off on CBT given this.  Tylenol if needed for headache.  Continue to avoid intense reading, screens.  F/u in 3-4 weeks after the holidays to reevaluate.  Total visit time 25 minutes - > 50% of which spent on counseling, answering questions.

## 2017-05-20 ENCOUNTER — Encounter: Payer: Self-pay | Admitting: Speech Pathology

## 2017-05-20 ENCOUNTER — Ambulatory Visit: Payer: PRIVATE HEALTH INSURANCE

## 2017-05-20 ENCOUNTER — Encounter: Payer: Self-pay | Admitting: Physical Therapy

## 2017-05-20 ENCOUNTER — Ambulatory Visit: Payer: PRIVATE HEALTH INSURANCE | Admitting: Speech Pathology

## 2017-05-20 ENCOUNTER — Other Ambulatory Visit: Payer: Self-pay

## 2017-05-20 VITALS — BP 155/75 | HR 75

## 2017-05-20 DIAGNOSIS — R41841 Cognitive communication deficit: Secondary | ICD-10-CM | POA: Diagnosis not present

## 2017-05-20 DIAGNOSIS — R2681 Unsteadiness on feet: Secondary | ICD-10-CM

## 2017-05-20 DIAGNOSIS — R42 Dizziness and giddiness: Secondary | ICD-10-CM

## 2017-05-20 NOTE — Assessment & Plan Note (Signed)
Patient's first concussion.  She continues to improve but not to the point where she could return to work unfortunately.  She will continue with cognitive therapy and PT/vestibular therapy.  Her mood symptoms are much better - advised to hold off on CBT given this.  Tylenol if needed for headache.  Continue to avoid intense reading, screens.  F/u in 3-4 weeks after the holidays to reevaluate.  Total visit time 25 minutes - > 50% of which spent on counseling, answering questions.

## 2017-05-20 NOTE — Therapy (Signed)
[place] MAIN [facility] SERVICES [address] [place], [place], [address] Phone: [phone]   Fax:  [phone]  Physical Therapy Treatment  Patient Details  Name: Holly [name] MRN: [id number] Date of Birth: 10-Jun-1946 Referring Provider: Dr. Barbaraann Barthel   Encounter Date: 05/20/2017  PT End of Session - 05/20/17 0910    Visit Number  6    Number of Visits  9    Date for PT Re-Evaluation  06/10/17    Authorization Type  no g codes/workers compensation    PT Start Time  0904    PT Stop Time  0950    PT Time Calculation (min)  46 min    Equipment Utilized During Treatment  Gait belt    Activity Tolerance  Patient tolerated treatment well    Behavior During Therapy  [facility] for tasks assessed/performed       Past Medical History:  Diagnosis Date  . Anxiety   . Depression   . Guillain Barr syndrome ([place]) 2005  . Osteoporosis   . Squamous cell skin cancer 2011    Past Surgical History:  Procedure Laterality Date  . ABDOMINAL HYSTERECTOMY  1989  . APPENDECTOMY  1960  . BREAST SURGERY  1980   biopsy  . WRIST FRACTURE SURGERY Right 2015    Vitals:   05/20/17 0913  BP: (!) 155/75  Pulse: 75  SpO2: 98%    Subjective Assessment - 05/20/17 0908    Subjective  Pt states that she is doing well today. She is out of her RUE sling now and is allowed to perform AROM of RUE but limited overhead movements. She has a referral for physical therapy to start working on her RUE. She feels like her dizziness is improving but she did have an episode where she leaned backwards while writing a text message on her phone the other day. Pt had a return visit to her MD who is managing her concussion and he reports that her testing is improving but she is still out of work for at least another month. No specific questions or concerns at this time.     Pertinent History  Parts of history borrowed by MD. Patient reports on 10/13 she was going up a ramp and lost her  footing, fell forward and onto right shoulder. Caused her to fracture nasal bone, proximal humerus. Pt states she bit through her bottom lip. Reports she had loss of consciousness for a short period. Head CT showed nasal fracture but no IC bleed. RUE is immobilized and being managed conservatively. She complains of "heaviness" feeling in head and eyes. She is still having intermittent headaches. She is complaining of balance problems, dizziness, still feels weak and has trouble focusing. She complains of dizziness with quick head turns. Denies vertigo. No prior history of concussion. She had Guillan Barre in 2005 that causes shakiness in arms since then but otherwise has done well. She reports some mild RLE weakness since the [place]. Denies any slurring of speech, difficulty, swallowing, or focal numbness/tingling/weakness since the concussion. ROS negative for red flags     Limitations  Reading    Diagnostic tests  Head CT: fractured nasal bone    Currently in Pain?  Yes    Pain Score  2     Pain Location  Shoulder    Pain Orientation  Right    Pain Descriptors / Indicators  Aching    Pain Type  Acute pain    Pain  Onset  More than a month ago    Pain Frequency  Intermittent           TREATMENT   Neuromuscular Re-education   Discussed plan of care with patient. Patient shown Pension scheme manager equipment to lessen anxiety and talk about possible interventions;  Stairs Stair ascend/descend with alternating cone tapping; Stair ascend/descend with horizontal and vertical ball pass while tracking with head/eyes (no RUE pain and mostly bending from elbows), pt reports increase in dizziness with vertical to 7/10; Attempted stair ascend/descend with cone tapping and boy/girl alphabet naming but pt unable to dual task so discontinued;  Medical Mall Ambulation in medical mall with head turning in horizontal, vertical, and diagonal (composite) directions looking for different objects. Pt  reports significant aggravation of symptoms; Ambulation in medical mall with horizontal and vertical ball passing to self with head/eye follow. Medical mall is busy with a lot of noise/patients and this is aggravating to patient's symptoms;  VOR X 1 exercise:  Patient performed VOR X 1 horizontal in standing 3 reps of 1 minute with conflicting background while marching; verbal cues for technique. Patient reports significant increase in symptoms while marching. She requires intermittent redirection due to challenge with dual task;  Semitandem  Semitandem balance with eyes closed alternating LE x multiple bouts, increased sway with infrequent finger touching in positions where feet are closer to full tandem stance;  Consolidated HEP onto one sheet and reviewed with patient to ensure understanding;                    PT Education - 05/20/17 0909    Education provided  Yes    Education Details  Home exercise progression    Person(s) Educated  Patient    Methods  Explanation    Comprehension  Verbalized understanding       PT Short Term Goals - 04/15/17 1142      PT SHORT TERM GOAL #1   Title  Pt will be independent with HEP in order to improve balance in order to decrease fall risk and improve function at home and work.    Time  4    Period  Weeks    Status  New    Target Date  05/13/17        PT Long Term Goals - 04/15/17 1142      PT LONG TERM GOAL #1   Title  Pt will improve ABC by at least 13% in order to demonstrate clinically significant improvement in balance confidence.     Baseline  04/15/17: 45%    Time  8    Period  Weeks    Status  New    Target Date  06/10/17      PT LONG TERM GOAL #2   Title  Pt will decrease DHI score by at least 18 points in order to demonstrate clinically significant reduction in disability     Baseline  04/15/17: 68/100    Time  8    Status  New    Target Date  06/10/17      PT LONG TERM GOAL #3   Title  Pt will improve  DGI to 24/24 in order to demonstrate improved ability to perform head turns with ambulation as well as safety with stairs    Baseline  04/15/17: 21/24    Time  8    Period  Weeks    Status  New    Target Date  06/10/17  Plan - 05/20/17 0910    Clinical Impression Statement  Pt struggles today with dual task activities such as climbing stairs while tapping cones and listing boys/girls names. She becomes dizziness with head turns during stair training as well as when performing head turns while walking in the medical mall. She enjoyed walking in the medical mall as she felt like it was an appopriate challenge to help her return to work. Pt provided written consolidation of her home exercise program with progression/regression. Encouraged her to add marching to VOR and smooth pursuit/saccade training for additional challenge. Progress feet together eyes closed exercise to semitandem. Will attempt Balance Master training during next treatment session as appropriate. Pt advised to follow-up as scheduled.     Rehab Potential  Excellent    PT Frequency  1x / week    PT Duration  8 weeks    PT Treatment/Interventions  Aquatic Therapy;ADLs/Self Care Home Management;Canalith Repostioning;Gait training;Stair training;DME Instruction;Functional mobility training;Therapeutic activities;Balance training;Therapeutic exercise;Neuromuscular re-education;Patient/family education;Manual techniques;Vestibular;Energy conservation    PT Next Visit Plan  Balance Master as appropriate, ambulation in medical mall/stairs, progress head turning activities with forward/retro ambulation, eyes closed activities, body rolls if able with RUE immoblized    PT Home Exercise Plan  Standing horizontal/vertical smooth pursuit with conflicting background and marching, standing horizontal/vertical saccades with conflicting background and marching, standing balance with feet in staggered stance on carpet with eyes closed  (progress to tandem), VOR x 1 horizontal in standing with conflicting backround and while marching, semitandem balance with horizontal head/body turns;    Consulted and Agree with Plan of Care  Patient       Patient will benefit from skilled therapeutic intervention in order to improve the following deficits and impairments:  Decreased activity tolerance, Decreased balance, Dizziness  Visit Diagnosis: Dizziness and giddiness  Unsteadiness on feet     Problem List Patient Active Problem List   Diagnosis Date Noted  . Closed fracture of upper end of humerus 04/25/2017  . Concussion with loss of consciousness 04/10/2017  . 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 Encounter for screening colonoscopy 03/16/2013  . Routine general medical examination at a health care facility 03/16/2013  . Right leg weakness 01/23/2013  . History of Guillain-Barre syndrome 01/23/2013  . Radial fracture 01/23/2013  . 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 UNSPECIFIED VITAMIN D DEFICIENCY 08/26/2008  . UNSPECIFIED ANEMIA 08/26/2008  . Recurrent depressive disorder, current episode moderate ([place]) 07/29/2008  . Fatigue due to depression 07/29/2008  . WEIGHT LOSS 07/29/2008   Phillips Grout PT, DPT   Huprich,Jason 05/20/2017, 11:12 AM  [place] MAIN [facility] SERVICES [address] [place], [place], [address] Phone: [phone]   Fax:  [phone]  Name: Holly [name] MRN: [id number] Date of Birth: [birth year]

## 2017-05-20 NOTE — Therapy (Signed)
[place] MAIN [facility] SERVICES [address] [place], [place], [address] Phone: [phone]   Fax:  [phone]  Speech Language Pathology Treatment  Patient Details  Name: Holly [name] MRN: [id number] Date of Birth: 02-14-1946 Referring Provider: Dene Gentry    Encounter Date: 05/20/2017  End of Session - 05/20/17 1304    Visit Number  6    Number of Visits  9    Date for SLP Re-Evaluation  05/27/17    SLP Start Time  1000    SLP Stop Time   1054    SLP Time Calculation (min)  54 min    Activity Tolerance  Patient tolerated treatment well       Past Medical History:  Diagnosis Date  . Anxiety   . Depression   . Guillain Barr syndrome ([place]) 2005  . Osteoporosis   . Squamous cell skin cancer 2011    Past Surgical History:  Procedure Laterality Date  . ABDOMINAL HYSTERECTOMY  1989  . APPENDECTOMY  1960  . BREAST SURGERY  1980   biopsy  . WRIST FRACTURE SURGERY Right 2015    There were no vitals filed for this visit.  Subjective Assessment - 05/20/17 1303    Subjective  The patient feels that she is improving, but still slow and having memory problems    Currently in Pain?  No/denies            ADULT SLP TREATMENT - 05/20/17 0001      General Information   Behavior/Cognition  Alert;Cooperative;Pleasant mood    HPI  Per MD: Patient is a 71 y.o. female here for concussion.  Patient reports on 10/13 she was going up a ramp and lost her footing, fell forward and onto right shoulder.  Caused her to fracture nasal bone, proximal humerus.  Reports she had loss of consciousness for a short period.  Had headache for 4-5 days with ~5/10 level of pain.  Balance problems, dizziness, still feels weak and has trouble focusing.  No nausea except from the percocet she took for fractures.  No prior history of concussion.  Feels things do not look 'quite straight' but denies wavy lines, loss of vision.  No weakness, numbness in  extremities.  She had Guillan Barre in 2005 that causes shakiness in arms since then but otherwise has done well.       Treatment Provided   Treatment provided  Cognitive-Linquistic      Pain Assessment   Pain Assessment  No/denies pain      Cognitive-Linquistic Treatment   Treatment focused on  Cognition;Patient/family/caregiver education    Skilled Treatment  COGNTION: Completed 4 Perplexor Level A puzzles with mod SLP cues to reason information given/no given in clues, recall strategies for completion, interpret information given, recall and manipulate information in clues and set-up paragraph.      Assessment / Recommendations / Plan   Plan  Continue with current plan of care      Progression Toward Goals   Progression toward goals  Progressing toward goals       SLP Education - 05/20/17 1303    Education provided  Yes    Education Details  memory and mental manipulation    Person(s) Educated  Patient    Methods  Explanation    Comprehension  Verbalized understanding         SLP Long Term Goals - 04/28/17 1310      SLP LONG TERM GOAL #1  Title  Patient will identify cognitive-communication barriers and participate in developing functional compensatory strategies.    Time  4    Period  Weeks    Status  New    Target Date  05/27/17      SLP LONG TERM GOAL #2   Title  Patient will complete complex attention, executive function, memory, and visual-spatial activities with 80% accuracy.    Time  4    Period  Weeks    Status  New    Target Date  05/27/17      SLP LONG TERM GOAL #3   Title  Patient will demonstrate functional cognitive-communication skills for successful return to work.    Time  4    Period  Weeks    Status  New    Target Date  05/27/17      SLP LONG TERM GOAL #4   Title  Patient will generate grammatical, fluent, and cogent sentence to complete abstract/complex linguistic task with 80% accuracy.    Time  4    Period  Weeks    Status  New     Target Date  05/27/17       Plan - 05/20/17 1304    Clinical Impression Statement  The patient is able to complete simple cognitive task accurately at a slow pace.  She demonstrates difficulty as the complexity increases.    Speech Therapy Frequency  2x / week    Duration  4 weeks    Treatment/Interventions  Cognitive reorganization;Internal/external aids;Compensatory strategies;SLP instruction and feedback;Patient/family education    Potential to Achieve Goals  Good    Potential Considerations  Ability to learn/carryover information;Co-morbidities;Cooperation/participation level;Medical prognosis;Previous level of function;Severity of impairments;Family/community support    SLP Home Exercise Plan  Re-do the 4 Perplexor puzzles done today    Consulted and Agree with Plan of Care  Patient       Patient will benefit from skilled therapeutic intervention in order to improve the following deficits and impairments:   Cognitive communication deficit    Problem List Patient Active Problem List   Diagnosis Date Noted  . Closed fracture of upper end of humerus 04/25/2017  . Concussion with loss of consciousness 04/10/2017  . 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 Encounter for screening colonoscopy 03/16/2013  . Routine general medical examination at a health care facility 03/16/2013  . Right leg weakness 01/23/2013  . History of Guillain-Barre syndrome 01/23/2013  . Radial fracture 01/23/2013  . 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 UNSPECIFIED VITAMIN D DEFICIENCY 08/26/2008  . UNSPECIFIED ANEMIA 08/26/2008  . Recurrent depressive disorder, current episode moderate ([place]) 07/29/2008  . Fatigue due to depression 07/29/2008  . WEIGHT LOSS 07/29/2008   Leroy Sea, MS/CCC- SLP  Lou Miner 05/20/2017, 1:07 PM  [place] MAIN [facility] SERVICES [address] [place], [place], [address] Phone: [phone]   Fax:  [phone]   Name: Holly [name] MRN: [id number] Date of Birth: 1946-03-27

## 2017-05-24 ENCOUNTER — Ambulatory Visit: Payer: PRIVATE HEALTH INSURANCE

## 2017-05-24 ENCOUNTER — Ambulatory Visit: Payer: PRIVATE HEALTH INSURANCE | Admitting: Speech Pathology

## 2017-05-24 DIAGNOSIS — M25511 Pain in right shoulder: Secondary | ICD-10-CM

## 2017-05-24 DIAGNOSIS — M6281 Muscle weakness (generalized): Secondary | ICD-10-CM

## 2017-05-24 DIAGNOSIS — R41841 Cognitive communication deficit: Secondary | ICD-10-CM

## 2017-05-24 DIAGNOSIS — M25611 Stiffness of right shoulder, not elsewhere classified: Secondary | ICD-10-CM

## 2017-05-24 NOTE — Therapy (Signed)
[place] MAIN [facility] SERVICES [address] [place], [place], [address] Phone: [phone]   Fax:  [phone]  Physical Therapy Evaluation  Patient Details  Name: Holly [name] MRN: [id number] Date of Birth: Nov 19, 1945 Referring Provider: Kurtis Bushman, MD   Encounter Date: 05/24/2017  PT End of Session - 05/24/17 1622    Visit Number  1    Number of Visits  12    Date for PT Re-Evaluation  07/05/17    PT Start Time  1500    PT Stop Time  1601    PT Time Calculation (min)  61 min    Activity Tolerance  Patient tolerated treatment well;Treatment limited secondary to medical complications (Comment);Patient limited by pain    Behavior During Therapy  [facility] for tasks assessed/performed       Past Medical History:  Diagnosis Date  . Anxiety   . Depression   . Guillain Barr syndrome ([place]) 2005  . Osteoporosis   . Squamous cell skin cancer 2011    Past Surgical History:  Procedure Laterality Date  . ABDOMINAL HYSTERECTOMY  1989  . APPENDECTOMY  1960  . BREAST SURGERY  1980   biopsy  . WRIST FRACTURE SURGERY Right 2015    There were no vitals filed for this visit.    PAIN: Current pain 4/10 Worst pain: 7/10  Best pain: 2-3/10  POSTURE: Protective guarding of R shoulder, R anteriorly rotated shoulder   PROM/AROM:   Seated AROM  Right Left  Shoulder Flexion 72, slight scaption  143  Shoulder Abduction 54 slight scaption  138  ER 4 68  IR Can bring to stomach    Supine PROM  Right Left  Shoulder Flexion 93 with elbow bent  180   Shoulder Abduction 87 in slight scaption  180  ER 20 full  IR 18 full   Patient presents with guarding of R shoulder with and without motion, Abduction movements preferred in scapular plane with compensatory elbow flexion in all motions.   Mobilizations:  L: WFL, slight hypermobility.  Not tested on R yet at this time  STRENGTH:  Graded on a 0-5 scale Muscle Group Left Right  Shoulder  flex 4+/5 2+/5 due to restricted motion  Shoulder Abd 4+/5 2+/5 due to restricted motion  Shoulder Ext 4+/5 2+/5 due to restricted motion  Shoulder IR/ER 4+/5 2+/5 due to restricted motion  Elbow 4+/5 3+/5  Wrist/hand 21kg 11 kg   SENSATION: WFL, no differences between L and R   FUNCTIONAL MOBILITY: Hand strength: R 11 kg, L 21 kg    OUTCOME MEASURES: TEST Outcome Interpretation  Quickdash Work 75% High disability   QuickDash sports/activity 100% High disability  QuickDash 81.8% High disability                 TREAT  Submaximal RTC isometrics in neutral for IR, ER, EXT, FLEX and ABD 10x 5 seconds Pendulum swings 30 seconds Scapular retractions -painful so terminated and deleted from HEP.         Objective measurements completed on examination: See above findings.              PT Education - 05/24/17 1622    Education provided  Yes    Education Details  HEP, POC    Person(s) Educated  Patient    Methods  Explanation;Demonstration;Verbal cues    Comprehension  Verbalized understanding;Returned demonstration       PT Short Term Goals - 05/24/17 1629  PT SHORT TERM GOAL #1   Title  Pt will be independent with HEP in order to improve R shoulder ROM and strength in order to decrease fall risk and improve function at home and work.    Baseline  HEP given     Time  2    Period  Weeks    Status  New    Target Date  06/07/17      PT SHORT TERM GOAL #2   Title  Patient will report a worst pain of 5/10 on VAS in R shoulder   to improve tolerance with ADLs and reduced symptoms with activities.     Baseline  12/11: 7/10    Time  2    Period  Weeks    Status  New    Target Date  06/07/17      PT SHORT TERM GOAL #3   Title  Patient will improve R shoulder AROM flexion and abduction to 80 degrees to improve ability to don and doff clothes     Baseline  flexion: 72 degrees, abduction 54 degrees     Time  2    Period  Weeks    Status  New    Target  Date  06/07/17        PT Long Term Goals - 05/24/17 1633      PT LONG TERM GOAL #1   Title  Pt will improve ABC by at least 13% in order to demonstrate clinically significant improvement in balance confidence.     Baseline  04/15/17: 45%    Time  8    Period  Weeks    Status  New      PT LONG TERM GOAL #2   Title  Pt will decrease DHI score by at least 18 points in order to demonstrate clinically significant reduction in disability     Baseline  04/15/17: 68/100    Time  8    Status  New      PT LONG TERM GOAL #3   Title  Pt will improve DGI to 24/24 in order to demonstrate improved ability to perform head turns with ambulation as well as safety with stairs    Baseline  04/15/17: 21/24    Time  8    Period  Weeks    Status  New      PT LONG TERM GOAL #4   Title  Patient will report a worst pain of 3/10 on VAS in R shoulder  to improve tolerance with ADLs and reduced symptoms with activities.     Baseline  12/11: 7/10 pain    Time  6    Period  Weeks    Status  New    Target Date  07/05/17      PT LONG TERM GOAL #5   Title  Patient will improve shoulder AROM to > 140 degrees of flexion, scaption, and abduction for improved ability to perform overhead activities    Baseline  12/11: flexion 72, abduction 54    Time  6    Period  Weeks    Status  New    Target Date  07/05/17      Additional Long Term Goals   Additional Long Term Goals  Yes      PT LONG TERM GOAL #6   Title  Patient will decrease Quick DASH score by > 8 points (73.8%)  demonstrating reduced self-reported upper extremity disability.    Baseline  12/11:  81.8%    Time  6    Period  Weeks    Status  New    Target Date  07/05/17      PT LONG TERM GOAL #7   Title  Patient will decrease Quick DASH Work score by > 8 points demonstrating reduced self-reported upper extremity disability.    Baseline   12/11: 67%    Time  6    Period  Weeks    Status  New    Target Date  07/05/17             Plan -  05/24/17 1624    Clinical Impression Statement   Patient is a pleasant 71 year old female who presents to physical therapy for closed fracture of proximal humerus on 03/26/17.  Patient presents with guarding of R shoulder with anteriorly rotated R GH joint.  Abduction movements preferred in scapular plane with compensatory elbow flexion in all motions. PROM of flexion limited to 93 degrees, abduction 87 degrees ER 20 degrees and IR 18 degrees with above mentioned compensations. QuickDash work =75%, QuickDash activity/sport =100% for gardening, QuickDash= 81.8%. Patient has weakness bilaterally with L potentially related to history of Guillain-Barre Syndrome. Patient will benefit from skilled physical therapy to decrease R shoulder pain, improve ROM and Strength, and return patient to previous level of function.     History and Personal Factors relevant to plan of care:  This patient presents with 3, personal factors/ comorbidities, and 4  body elements including body structures and functions, activity limitations and or participation restrictions. Patient's condition is evolving.     Clinical Presentation  Evolving    Clinical Presentation due to:  variable symptoms progressively resolving     Clinical Decision Making  Moderate    Rehab Potential  Good    Clinical Impairments Affecting Rehab Potential  History of GB syndrome, comordities of fall, (+) continued progression of symptoms, acuity of problem.     PT Frequency  2x / week    PT Duration  6 weeks    PT Treatment/Interventions  ADLs/Self Care Home Management;Biofeedback;Cryotherapy;Electrical Stimulation;Iontophoresis 4mg /ml Dexamethasone;Moist Heat;Traction;Ultrasound;Therapeutic exercise;Therapeutic activities;Neuromuscular re-education;Patient/family education;Manual techniques;Passive range of motion;Dry needling;Energy conservation;Taping    PT Next Visit Plan  review isometrics against wall, check to see if AAROM allowed, PROM     PT Home  Exercise Plan  see sheet    Consulted and Agree with Plan of Care  Patient       Patient will benefit from skilled therapeutic intervention in order to improve the following deficits and impairments:  Decreased activity tolerance, Decreased knowledge of precautions, Decreased endurance, Decreased coordination, Decreased mobility, Decreased range of motion, Decreased safety awareness, Decreased strength, Hypomobility, Impaired flexibility, Impaired perceived functional ability, Impaired UE functional use, Postural dysfunction, Improper body mechanics, Pain  Visit Diagnosis: Stiffness of right shoulder, not elsewhere classified  Acute pain of right shoulder  Muscle weakness (generalized)     Problem List Patient Active Problem List   Diagnosis Date Noted  . Closed fracture of upper end of humerus 04/25/2017  . Concussion with loss of consciousness 04/10/2017  . 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 Encounter for screening colonoscopy 03/16/2013  . Routine general medical examination at a health care facility 03/16/2013  . Right leg weakness 01/23/2013  . History of Guillain-Barre syndrome 01/23/2013  . Radial fracture 01/23/2013  . 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 UNSPECIFIED VITAMIN D DEFICIENCY 08/26/2008  . UNSPECIFIED ANEMIA 08/26/2008  . Recurrent depressive disorder, current episode  moderate ([place]) 07/29/2008  . Fatigue due to depression 07/29/2008  . WEIGHT LOSS 07/29/2008   Janna Arch, PT, DPT   Janna Arch 05/24/2017, 4:39 PM  [place] MAIN [facility] SERVICES [address] [place], [place], [address] Phone: [phone]   Fax:  [phone]  Name: BENISHA HADAWAY MRN: [id number] Date of Birth: 1946-04-29

## 2017-05-25 ENCOUNTER — Encounter: Payer: Self-pay | Admitting: Speech Pathology

## 2017-05-25 ENCOUNTER — Other Ambulatory Visit: Payer: Self-pay

## 2017-05-25 ENCOUNTER — Ambulatory Visit: Payer: PRIVATE HEALTH INSURANCE

## 2017-05-25 NOTE — Therapy (Signed)
[place] MAIN [facility] SERVICES [address] [place], [place], [address] Phone: [phone]   Fax:  [phone]  Speech Language Pathology Treatment  Patient Details  Name: Holly [name] MRN: [id number] Date of Birth: [birth year] Referring Provider: Dene Gentry    Encounter Date: 05/24/2017  End of Session - 05/25/17 0914    Visit Number  7    Number of Visits  9    Date for SLP Re-Evaluation  05/27/17    SLP Start Time  1400    SLP Stop Time   1450    SLP Time Calculation (min)  50 min    Activity Tolerance  Patient tolerated treatment well       Past Medical History:  Diagnosis Date  . Anxiety   . Depression   . Guillain Barr syndrome ([place]) 2005  . Osteoporosis   . Squamous cell skin cancer 2011    Past Surgical History:  Procedure Laterality Date  . ABDOMINAL HYSTERECTOMY  1989  . APPENDECTOMY  1960  . BREAST SURGERY  1980   biopsy  . WRIST FRACTURE SURGERY Right 2015    There were no vitals filed for this visit.  Subjective Assessment - 05/25/17 0914    Subjective  The patient feels that she is improving, but still slow and having memory problems    Currently in Pain?  No/denies            ADULT SLP TREATMENT - 05/25/17 0001      General Information   Behavior/Cognition  Alert;Cooperative;Pleasant mood    HPI  Per MD: Patient is a 71 y.o. female here for concussion.  Patient reports on 10/13 she was going up a ramp and lost her footing, fell forward and onto right shoulder.  Caused her to fracture nasal bone, proximal humerus.  Reports she had loss of consciousness for a short period.  Had headache for 4-5 days with ~5/10 level of pain.  Balance problems, dizziness, still feels weak and has trouble focusing.  No nausea except from the percocet she took for fractures.  No prior history of concussion.  Feels things do not look 'quite straight' but denies wavy lines, loss of vision.  No weakness, numbness in  extremities.  She had Guillan Barre in 2005 that causes shakiness in arms since then but otherwise has done well.       Treatment Provided   Treatment provided  Cognitive-Linquistic      Pain Assessment   Pain Assessment  No/denies pain      Cognitive-Linquistic Treatment   Treatment focused on  Cognition;Patient/family/caregiver education    Skilled Treatment  COGNTION: Completed a Perplexor Level A puzzles with min SLP cues to reason information given in clues, recall strategies for completion, interpret information given, recall and manipulate information in clues and set-up paragraph.  Completed a simpler logic problem with min cues.  Completed math word problems with 95% accuracy, no cues.  MEMORY AND MENTAL MANIPULATION: repeat 3 words in reverse order with 95% accuracy.      Assessment / Recommendations / Plan   Plan  Continue with current plan of care      Progression Toward Goals   Progression toward goals  Progressing toward goals       SLP Education - 05/25/17 0914    Education provided  Yes    Education Details  Slow but accurate work is good work.  Gradually build up mental/cognitive endurance.    Person(s)  Educated  Patient    Methods  Explanation    Comprehension  Verbalized understanding         SLP Long Term Goals - 04/28/17 1310      SLP LONG TERM GOAL #1   Title  Patient will identify cognitive-communication barriers and participate in developing functional compensatory strategies.    Time  4    Period  Weeks    Status  New    Target Date  05/27/17      SLP LONG TERM GOAL #2   Title  Patient will complete complex attention, executive function, memory, and visual-spatial activities with 80% accuracy.    Time  4    Period  Weeks    Status  New    Target Date  05/27/17      SLP LONG TERM GOAL #3   Title  Patient will demonstrate functional cognitive-communication skills for successful return to work.    Time  4    Period  Weeks    Status  New     Target Date  05/27/17      SLP LONG TERM GOAL #4   Title  Patient will generate grammatical, fluent, and cogent sentence to complete abstract/complex linguistic task with 80% accuracy.    Time  4    Period  Weeks    Status  New    Target Date  05/27/17       Plan - 05/25/17 0915    Clinical Impression Statement  The patient is able to complete simple cognitive task accurately at a slow pace.  She demonstrates difficulty and increased anxiety as the complexity increases.    Speech Therapy Frequency  2x / week    Duration  4 weeks    Treatment/Interventions  Cognitive reorganization;Internal/external aids;Compensatory strategies;SLP instruction and feedback;Patient/family education    Potential to Achieve Goals  Good    Potential Considerations  Ability to learn/carryover information;Co-morbidities;Cooperation/participation level;Medical prognosis;Previous level of function;Severity of impairments;Family/community support    SLP Home Exercise Plan  memorize 16 words using sorting and remembering categories strategy    Consulted and Agree with Plan of Care  Patient       Patient will benefit from skilled therapeutic intervention in order to improve the following deficits and impairments:   Cognitive communication deficit    Problem List Patient Active Problem List   Diagnosis Date Noted  . Closed fracture of upper end of humerus 04/25/2017  . Concussion with loss of consciousness 04/10/2017  . 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 Encounter for screening colonoscopy 03/16/2013  . Routine general medical examination at a health care facility 03/16/2013  . Right leg weakness 01/23/2013  . History of Guillain-Barre syndrome 01/23/2013  . Radial fracture 01/23/2013  . 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 UNSPECIFIED VITAMIN D DEFICIENCY 08/26/2008  . UNSPECIFIED ANEMIA 08/26/2008  . Recurrent depressive disorder, current episode moderate ([place]) 07/29/2008  . Fatigue due to depression  07/29/2008  . WEIGHT LOSS 07/29/2008   Leroy Sea, MS/CCC- SLP  Lou Miner 05/25/2017, 9:16 AM  [place] MAIN [facility] SERVICES [address] [place], [place], [address] Phone: [phone]   Fax:  [phone]   Name: Holly [name] MRN: [id number] Date of Birth: 1946/05/02

## 2017-05-26 ENCOUNTER — Ambulatory Visit (INDEPENDENT_AMBULATORY_CARE_PROVIDER_SITE_OTHER): Payer: Medicare Other | Admitting: *Deleted

## 2017-05-26 DIAGNOSIS — M810 Age-related osteoporosis without current pathological fracture: Secondary | ICD-10-CM | POA: Diagnosis not present

## 2017-05-26 MED ORDER — DENOSUMAB 60 MG/ML ~~LOC~~ SOLN
60.0000 mg | Freq: Once | SUBCUTANEOUS | Status: AC
  Administered 2017-05-26: 60 mg via SUBCUTANEOUS

## 2017-05-26 NOTE — Progress Notes (Signed)
Patient presented for Prolia injection to right arm given subQ, patient voiced no complaints or concerns during injection.

## 2017-05-27 ENCOUNTER — Ambulatory Visit: Payer: PRIVATE HEALTH INSURANCE

## 2017-05-27 ENCOUNTER — Other Ambulatory Visit: Payer: Self-pay

## 2017-05-27 ENCOUNTER — Ambulatory Visit: Payer: PRIVATE HEALTH INSURANCE | Admitting: Speech Pathology

## 2017-05-27 ENCOUNTER — Encounter: Payer: Self-pay | Admitting: Speech Pathology

## 2017-05-27 DIAGNOSIS — M6281 Muscle weakness (generalized): Secondary | ICD-10-CM

## 2017-05-27 DIAGNOSIS — R41841 Cognitive communication deficit: Secondary | ICD-10-CM | POA: Diagnosis not present

## 2017-05-27 DIAGNOSIS — R42 Dizziness and giddiness: Secondary | ICD-10-CM

## 2017-05-27 NOTE — Therapy (Signed)
[place] MAIN [facility] SERVICES [address] [place], [place], [address] Phone: [phone]   Fax:  [phone]  Speech Language Pathology Treatment  Patient Details  Name: Holly [name] MRN: [id number] Date of Birth: 11-Sep-1945 Referring Provider: Dene Gentry    Encounter Date: 05/27/2017  End of Session - 05/27/17 1352    Visit Number  8    Number of Visits  9    Date for SLP Re-Evaluation  05/27/17    SLP Start Time  1000    SLP Stop Time   1100    SLP Time Calculation (min)  60 min    Activity Tolerance  Patient tolerated treatment well       Past Medical History:  Diagnosis Date  . Anxiety   . Depression   . Guillain Barr syndrome ([place]) 2005  . Osteoporosis   . Squamous cell skin cancer 2011    Past Surgical History:  Procedure Laterality Date  . ABDOMINAL HYSTERECTOMY  1989  . APPENDECTOMY  1960  . BREAST SURGERY  1980   biopsy  . WRIST FRACTURE SURGERY Right 2015    There were no vitals filed for this visit.  Subjective Assessment - 05/27/17 1352    Subjective  The patient feels that she is improving, but still slow and having memory problems    Currently in Pain?  No/denies            ADULT SLP TREATMENT - 05/27/17 0001      General Information   Behavior/Cognition  Alert;Cooperative;Pleasant mood    HPI  Per MD: Patient is a 71 y.o. female here for concussion.  Patient reports on 10/13 she was going up a ramp and lost her footing, fell forward and onto right shoulder.  Caused her to fracture nasal bone, proximal humerus.  Reports she had loss of consciousness for a short period.  Had headache for 4-5 days with ~5/10 level of pain.  Balance problems, dizziness, still feels weak and has trouble focusing.  No nausea except from the percocet she took for fractures.  No prior history of concussion.  Feels things do not look 'quite straight' but denies wavy lines, loss of vision.  No weakness, numbness in  extremities.  She had Guillan Barre in 2005 that causes shakiness in arms since then but otherwise has done well.       Treatment Provided   Treatment provided  Cognitive-Linquistic      Pain Assessment   Pain Assessment  No/denies pain      Cognitive-Linquistic Treatment   Treatment focused on  Cognition;Patient/family/caregiver education    Skilled Treatment  COGNTION: Completed a Perplexor Level A puzzles with 2 SLP cues after review of "rule" to complete the puzzles.   Completed math word problems with 95% accuracy, but requires cues to clarify verbalized reasoning.  MEMORY AND MENTAL MANIPULATION: repeat 4 words in reverse order with 60% accuracy; improves to 100% given repetition of stimulus.  MEMORY: repeat 4-word list, without repetition of stimulus, with 75% accuracy; answer question RE: list with no difficulty.  Recall 5 pictured people and (unrelated) associated object with 100% accuracy.  Recall/draw geometric shape associated with line drawing with 90% accuracy.  Error was faulty information coding.      Assessment / Recommendations / Plan   Plan  Continue with current plan of care      Progression Toward Goals   Progression toward goals  Progressing toward goals  SLP Education - 05/27/17 1352    Education provided  Yes    Education Details  Verbalize the "obvious"    Person(s) Educated  Patient    Methods  Explanation    Comprehension  Verbalized understanding         SLP Long Term Goals - 05/27/17 1357      SLP LONG TERM GOAL #1   Title  Patient will identify cognitive-communication barriers and participate in developing functional compensatory strategies.    Time  4    Period  Weeks    Status  Partially Met      SLP LONG TERM GOAL #2   Title  Patient will complete complex attention, executive function, memory, and visual-spatial activities with 80% accuracy.    Time  4    Period  Weeks    Status  Partially Met      SLP LONG TERM GOAL #3   Title   Patient will demonstrate functional cognitive-communication skills for successful return to work.    Time  4    Period  Weeks    Status  Partially Met      SLP LONG TERM GOAL #4   Title  Patient will generate grammatical, fluent, and cogent sentence to complete abstract/complex linguistic task with 80% accuracy.    Time  4    Period  Weeks    Status  Partially Met       Plan - 05/27/17 1353    Clinical Impression Statement  The patient is able to complete simple cognitive task accurately at a slow pace.  She demonstrates difficulty and increased anxiety as the complexity increases.  The patient is having difficulty with accurately and specifically stating reasoning in drawing conclusions from math word problems (states "it's obvious" but cannot state the obvious).  Will continue ST to address using language skills to improve cognitive/reasoning skills.    Speech Therapy Frequency  2x / week    Duration  4 weeks    Treatment/Interventions  Cognitive reorganization;Internal/external aids;Compensatory strategies;SLP instruction and feedback;Patient/family education    Potential to Achieve Goals  Good    Potential Considerations  Ability to learn/carryover information;Co-morbidities;Cooperation/participation level;Medical prognosis;Previous level of function;Severity of impairments;Family/community support    SLP Home Exercise Plan  memorize 16 words using sorting and remembering categories strategy    Consulted and Agree with Plan of Care  Patient       Patient will benefit from skilled therapeutic intervention in order to improve the following deficits and impairments:   Cognitive communication deficit    Problem List Patient Active Problem List   Diagnosis Date Noted  . Closed fracture of upper end of humerus 04/25/2017  . Concussion with loss of consciousness 04/10/2017  . 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 Encounter for screening colonoscopy 03/16/2013  . Routine general medical  examination at a health care facility 03/16/2013  . Right leg weakness 01/23/2013  . History of Guillain-Barre syndrome 01/23/2013  . Radial fracture 01/23/2013  . 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 UNSPECIFIED VITAMIN D DEFICIENCY 08/26/2008  . UNSPECIFIED ANEMIA 08/26/2008  . Recurrent depressive disorder, current episode moderate ([place]) 07/29/2008  . Fatigue due to depression 07/29/2008  . WEIGHT LOSS 07/29/2008   Leroy Sea, MS/CCC- SLP  Lou Miner 05/27/2017, 1:58 PM  [place] MAIN [facility] SERVICES [address] [place], [place], [address] Phone: [phone]   Fax:  [phone]   Name: Holly [name] MRN: [id number] Date of Birth: 18-Sep-1945

## 2017-05-31 ENCOUNTER — Ambulatory Visit: Payer: Worker's Compensation | Attending: Orthopedic Surgery

## 2017-05-31 ENCOUNTER — Ambulatory Visit: Payer: PRIVATE HEALTH INSURANCE | Admitting: Speech Pathology

## 2017-05-31 DIAGNOSIS — M6281 Muscle weakness (generalized): Secondary | ICD-10-CM

## 2017-05-31 DIAGNOSIS — R41841 Cognitive communication deficit: Secondary | ICD-10-CM

## 2017-05-31 DIAGNOSIS — M25511 Pain in right shoulder: Secondary | ICD-10-CM

## 2017-05-31 DIAGNOSIS — M25611 Stiffness of right shoulder, not elsewhere classified: Secondary | ICD-10-CM | POA: Insufficient documentation

## 2017-05-31 NOTE — Therapy (Signed)
[place] MAIN [facility] SERVICES [address] [place], [place], [address] Phone: [phone]   Fax:  [phone]  Physical Therapy Treatment  Patient Details  Name: Holly [name] MRN: [id number] Date of Birth: 06/06/1946 Referring Provider: Kurtis Bushman, MD   Encounter Date: 05/31/2017  PT End of Session - 05/31/17 1544    Visit Number  8    Number of Visits  9    Date for PT Re-Evaluation  06/10/17    Authorization Type  no g codes/workers compensation    PT Start Time  1345    PT Stop Time  1430    PT Time Calculation (min)  45 min    Equipment Utilized During Treatment  Gait belt    Activity Tolerance  Patient tolerated treatment well    Behavior During Therapy  [facility] for tasks assessed/performed       Past Medical History:  Diagnosis Date  . Anxiety   . Depression   . Guillain Barr syndrome ([place]) 2005  . Osteoporosis   . Squamous cell skin cancer 2011    Past Surgical History:  Procedure Laterality Date  . ABDOMINAL HYSTERECTOMY  1989  . APPENDECTOMY  1960  . BREAST SURGERY  1980   biopsy  . WRIST FRACTURE SURGERY Right 2015    There were no vitals filed for this visit.  Subjective Assessment - 05/31/17 1349    Subjective  Patient reports feeling more pain in shoulder today. Last night having excessive pain 8/10, reports it is feeling swollen.     Pertinent History  Parts of history borrowed by MD. Patient reports on 10/13 she was going up a ramp and lost her footing, fell forward and onto right shoulder. Caused her to fracture nasal bone, proximal humerus. Pt states she bit through her bottom lip. Reports she had loss of consciousness for a short period. Head CT showed nasal fracture but no IC bleed. RUE is immobilized and being managed conservatively. She complains of "heaviness" feeling in head and eyes. She is still having intermittent headaches. She is complaining of balance problems, dizziness, still feels weak and has  trouble focusing. She complains of dizziness with quick head turns. Denies vertigo. No prior history of concussion. She had Guillan Barre in 2005 that causes shakiness in arms since then but otherwise has done well. She reports some mild RLE weakness since the [facility]. Denies any slurring of speech, difficulty, swallowing, or focal numbness/tingling/weakness since the concussion. ROS negative for red flags     Limitations  Reading    Currently in Pain?  Yes    Pain Score  5     Pain Location  Shoulder    Pain Orientation  Right    Pain Descriptors / Indicators  Aching;Penetrating;Guarding    Pain Type  Acute pain    Pain Onset  More than a month ago    Pain Frequency  Intermittent           PROM holds 10x 30 seconds  Flexion: stabilization above gh joint x6 and at scapula x 4  Abduction, stabilization above gh joint   IR with towel under shoulder to place into scaption plane  ER with shoulder placed into scaption plane by towel   Submaximal RTC isometrics in neutral for IR, ER, EXT, FLEX and ABD 10x 5 seconds each position  Pendulum swings 30 seconds   Education on sleeping positioning and use of pillows to decrease interruptions from shoulder pain.  Pt. response to medical necessity:    Patient will continue to benefit from skilled physical therapy to decrease R shoulder pain, improve ROM and strength, and return patient to previous level of function.               PT Education - 05/31/17 1543    Education provided  Yes    Education Details  sleeping positions for decreased episodes of waking up from pain     Person(s) Educated  Patient    Methods  Explanation;Demonstration;Verbal cues    Comprehension  Verbalized understanding;Returned demonstration       PT Short Term Goals - 05/24/17 1629      PT SHORT TERM GOAL #1   Title  Pt will be independent with HEP in order to improve R shoulder ROM and strength in order to decrease fall risk and improve  function at home and work.    Baseline  HEP given     Time  2    Period  Weeks    Status  New    Target Date  06/07/17      PT SHORT TERM GOAL #2   Title  Patient will report a worst pain of 5/10 on VAS in R shoulder   to improve tolerance with ADLs and reduced symptoms with activities.     Baseline  12/11: 7/10    Time  2    Period  Weeks    Status  New    Target Date  06/07/17      PT SHORT TERM GOAL #3   Title  Patient will improve R shoulder AROM flexion and abduction to 80 degrees to improve ability to don and doff clothes     Baseline  flexion: 72 degrees, abduction 54 degrees     Time  2    Period  Weeks    Status  New    Target Date  06/07/17        PT Long Term Goals - 05/24/17 1633      PT LONG TERM GOAL #1   Title  Pt will improve ABC by at least 13% in order to demonstrate clinically significant improvement in balance confidence.     Baseline  04/15/17: 45%    Time  8    Period  Weeks    Status  New      PT LONG TERM GOAL #2   Title  Pt will decrease DHI score by at least 18 points in order to demonstrate clinically significant reduction in disability     Baseline  04/15/17: 68/100    Time  8    Status  New      PT LONG TERM GOAL #3   Title  Pt will improve DGI to 24/24 in order to demonstrate improved ability to perform head turns with ambulation as well as safety with stairs    Baseline  04/15/17: 21/24    Time  8    Period  Weeks    Status  New      PT LONG TERM GOAL #4   Title  Patient will report a worst pain of 3/10 on VAS in R shoulder  to improve tolerance with ADLs and reduced symptoms with activities.     Baseline  12/11: 7/10 pain    Time  6    Period  Weeks    Status  New    Target Date  07/05/17      PT LONG TERM  GOAL #5   Title  Patient will improve shoulder AROM to > 140 degrees of flexion, scaption, and abduction for improved ability to perform overhead activities    Baseline  12/11: flexion 72, abduction 54    Time  6    Period   Weeks    Status  New    Target Date  07/05/17      Additional Long Term Goals   Additional Long Term Goals  Yes      PT LONG TERM GOAL #6   Title  Patient will decrease Quick DASH score by > 8 points (73.8%)  demonstrating reduced self-reported upper extremity disability.    Baseline  12/11: 81.8%    Time  6    Period  Weeks    Status  New    Target Date  07/05/17      PT LONG TERM GOAL #7   Title  Patient will decrease Quick DASH Work score by > 8 points demonstrating reduced self-reported upper extremity disability.    Baseline   12/11: 67%    Time  6    Period  Weeks    Status  New    Target Date  07/05/17            Plan - 05/31/17 1546    Clinical Impression Statement   Patient's session limited today due to patient fatigue from lack of sleep night prior. Patient educated on shoulder positioning for sleeping to decrease episodes of waking up from pain. Educated on different positioning of pillows as well as additional types of pillows that can be used as a secondary measure. Isometrics challenge patient, however allow for strengthening of musculature in a non painful manner. Patient will continue to benefit from skilled physical therapy to decrease R shoulder pain, improve ROM and strength, and return patient to previous level of function.     Rehab Potential  Excellent    PT Frequency  2x / week    PT Duration  8 weeks    PT Treatment/Interventions  Aquatic Therapy;ADLs/Self Care Home Management;Canalith Repostioning;Gait training;Stair training;DME Instruction;Functional mobility training;Therapeutic activities;Balance training;Therapeutic exercise;Neuromuscular re-education;Patient/family education;Manual techniques;Vestibular;Energy conservation    PT Next Visit Plan  ambulation in medical mall/stairs, seated card sorting to simulate desk activities, progress head turning activities with forward/retro ambulation, eyes closed activities, body rolls eyes open/closed    PT  Home Exercise Plan  Standing horizontal/vertical smooth pursuit with conflicting background and marching, standing horizontal/vertical saccades with conflicting background and marching, standing balance with feet in staggered stance on carpet with eyes closed (progress to tandem), VOR x 1 horizontal in standing with conflicting backround and while marching, semitandem balance with horizontal head/body turns;    Consulted and Agree with Plan of Care  Patient       Patient will benefit from skilled therapeutic intervention in order to improve the following deficits and impairments:  Decreased activity tolerance, Decreased balance, Dizziness  Visit Diagnosis: Muscle weakness (generalized)  Stiffness of right shoulder, not elsewhere classified  Acute pain of right shoulder     Problem List Patient Active Problem List   Diagnosis Date Noted  . Closed fracture of upper end of humerus 04/25/2017  . Concussion with loss of consciousness 04/10/2017  . 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 Encounter for screening colonoscopy 03/16/2013  . Routine general medical examination at a health care facility 03/16/2013  . Right leg weakness 01/23/2013  . History of Guillain-Barre syndrome 01/23/2013  . Radial fracture 01/23/2013  .  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 UNSPECIFIED VITAMIN D DEFICIENCY 08/26/2008  . UNSPECIFIED ANEMIA 08/26/2008  . Recurrent depressive disorder, current episode moderate ([place]) 07/29/2008  . Fatigue due to depression 07/29/2008  . WEIGHT LOSS 07/29/2008   Janna Arch, PT, DPT   Janna Arch 05/31/2017, 3:48 PM  [place] MAIN [facility] SERVICES [address] [place], [place], [address] Phone: [phone]   Fax:  [phone]  Name: Holly [name] MRN: [id number] Date of Birth: 04/01/1946

## 2017-06-01 ENCOUNTER — Encounter: Payer: Self-pay | Admitting: Speech Pathology

## 2017-06-01 ENCOUNTER — Other Ambulatory Visit: Payer: Self-pay

## 2017-06-01 NOTE — Therapy (Signed)
[place] MAIN [facility] SERVICES [address] [place], [place], [address] Phone: [phone]   Fax:  [phone]  Speech Language Pathology Treatment/Re-Certification  Patient Details  Name: Holly [name] MRN: [id number] Date of Birth: 1946/03/16 Referring Provider: Dene Gentry    Encounter Date: 05/31/2017  End of Session - 06/01/17 1241    Visit Number  9    Number of Visits  17    Date for SLP Re-Evaluation  07/01/17    SLP Start Time  67    SLP Stop Time   1345    SLP Time Calculation (min)  45 min    Activity Tolerance  Patient tolerated treatment well       Past Medical History:  Diagnosis Date  . Anxiety   . Depression   . Guillain Barr syndrome ([place]) 2005  . Osteoporosis   . Squamous cell skin cancer 2011    Past Surgical History:  Procedure Laterality Date  . ABDOMINAL HYSTERECTOMY  1989  . APPENDECTOMY  1960  . BREAST SURGERY  1980   biopsy  . WRIST FRACTURE SURGERY Right 2015    There were no vitals filed for this visit.  Subjective Assessment - 06/01/17 1239    Subjective  The patient feels that she is improving, but still slow and having memory problems    Currently in Pain?  No/denies            ADULT SLP TREATMENT - 06/01/17 0001      General Information   Behavior/Cognition  Alert;Cooperative;Pleasant mood    HPI  Per MD: Patient is a 71 y.o. female here for concussion.  Patient reports on 10/13 she was going up a ramp and lost her footing, fell forward and onto right shoulder.  Caused her to fracture nasal bone, proximal humerus.  Reports she had loss of consciousness for a short period.  Had headache for 4-5 days with ~5/10 level of pain.  Balance problems, dizziness, still feels weak and has trouble focusing.  No nausea except from the percocet she took for fractures.  No prior history of concussion.  Feels things do not look 'quite straight' but denies wavy lines, loss of vision.  No  weakness, numbness in extremities.  She had Guillan Barre in 2005 that causes shakiness in arms since then but otherwise has done well.       Treatment Provided   Treatment provided  Cognitive-Linquistic      Pain Assessment   Pain Assessment  No/denies pain      Cognitive-Linquistic Treatment   Treatment focused on  Cognition;Patient/family/caregiver education    Skilled Treatment  COGNTION: Answered "tricky" questions with 70% accuracy.  Patient had difficulty with identifying tacit assumptions, answering questions asked, flexible approach/brain storming possible solutions, and reduced attention to small details.  In addition, if the answer was not immediately coming to her, she becomes anxious and less able to brain storm.   MEMORY AND MENTAL MANIPULATION: repeat 4 words in reverse order with 60% accuracy; improves to 100% given repetition of stimulus.  MEMORY: repeat 4-word list, without repetition of stimulus, with 75% accuracy; answer question RE: list with no difficulty.  Recall 5 pictured people and (unrelated) associated object with 100% accuracy.  Recall/draw geometric shape associated with line drawing with 90% accuracy.  Error was faulty information coding.      Assessment / Recommendations / Plan   Plan  Continue with current plan of care  SLP Education - 06/01/17 1240    Education provided  Yes    Education Details  identify potential barriers to return to work and brain storm potential strategies    Person(s) Educated  Patient    Methods  Explanation    Comprehension  Verbalized understanding;Need further instruction         SLP Long Term Goals - 06/01/17 1243      SLP LONG TERM GOAL #1   Title  Patient will identify cognitive-communication barriers and participate in developing functional compensatory strategies.    Time  4    Period  Weeks    Status  Partially Met    Target Date  07/01/17      SLP LONG TERM GOAL #2   Title  Patient will complete complex  attention, executive function, memory, and visual-spatial activities with 80% accuracy.    Time  4    Period  Weeks    Status  Partially Met    Target Date  07/01/17      SLP LONG TERM GOAL #3   Title  Patient will demonstrate functional cognitive-communication skills for successful return to work.    Time  4    Period  Weeks    Status  Partially Met    Target Date  07/01/17      SLP LONG TERM GOAL #4   Title  Patient will generate grammatical, fluent, and cogent sentence to complete abstract/complex linguistic task with 80% accuracy.    Time  4    Period  Weeks    Status  Partially Met    Target Date  07/01/17       Plan - 06/01/17 1242    Clinical Impression Statement  The patient is able to complete simple cognitive tasks accurately at a slow pace.  The patient demonstrates adequate memory to complete more complex cognitive tasks.  However, she demonstrates increased anxiety as the complexity increases.  The patient is having difficulty with accurately and specifically stating reasoning in drawing conclusions in cognitive linguistic tasks (states "it's obvious" but cannot state the obvious).  Will continue ST to address using language skills to improve cognitive/reasoning skills and improve her ability to identify, and develop strategies to cope with, cognitive barriers to return to work.      Speech Therapy Frequency  2x / week    Duration  4 weeks    Treatment/Interventions  Cognitive reorganization;Internal/external aids;Compensatory strategies;SLP instruction and feedback;Patient/family education    Potential to Achieve Goals  Good    Potential Considerations  Ability to learn/carryover information;Co-morbidities;Cooperation/participation level;Medical prognosis;Previous level of function;Severity of impairments;Family/community support    SLP Home Exercise Plan  identify potential barriers to return to work    [employer] and Agree with Plan of Care  Patient       Patient will  benefit from skilled therapeutic intervention in order to improve the following deficits and impairments:   Cognitive communication deficit - Plan: SLP plan of care cert/re-cert    Problem List Patient Active Problem List   Diagnosis Date Noted  . Closed fracture of upper end of humerus 04/25/2017  . Concussion with loss of consciousness 04/10/2017  . 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 Encounter for screening colonoscopy 03/16/2013  . Routine general medical examination at a health care facility 03/16/2013  . Right leg weakness 01/23/2013  . History of Guillain-Barre syndrome 01/23/2013  . Radial fracture 01/23/2013  . 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 UNSPECIFIED VITAMIN D DEFICIENCY 08/26/2008  .  UNSPECIFIED ANEMIA 08/26/2008  . Recurrent depressive disorder, current episode moderate ([facility]) 07/29/2008  . Fatigue due to depression 07/29/2008  . WEIGHT LOSS 07/29/2008    G , MS/CCC- SLP  , Susie 06/01/2017, 12:47 PM  [facility] MAIN REHAB SERVICES [address] [place], [place], [address] Phone: [phone]   Fax:  [phone]   Name: Holly [name] MRN: [id number] Date of Birth: 01/09/1946 

## 2017-06-02 NOTE — Therapy (Signed)
[place] MAIN [facility] SERVICES [address] [place], [place], [address] Phone: [phone]   Fax:  [phone]  Physical Therapy Treatment  Patient Details  Name: Holly [name] MRN: [id number] Date of Birth: Jul 07, 1945 Referring Provider: Kurtis Bushman, MD   Encounter Date: 05/27/2017  PT End of Session - 05/27/17 0843     Visit Number 7    Number of Visits 9    Date for PT Re-Evaluation 06/10/17    Authorization Type no g codes/workers compensation    PT Start Time 0845    PT Stop Time 0940    PT Time Calculation (min) 55 min    Equipment Utilized During Treatment Gait belt    Activity Tolerance Patient tolerated treatment well    Behavior During Therapy [facility] for tasks assessed/performed      Subjective Assessment - 05/27/17 0842     Subjective Pt reports that she is doing alright today. She reports increased dizziness since waking this morning. No particular trigger. She has been performing HEP including marching with VOR which she finds challenging but no overly irritating to her symptoms. No specific questions at this time.    Pertinent History Parts of history borrowed by MD. Patient reports on 10/13 she was going up a ramp and lost her footing, fell forward and onto right shoulder. Caused her to fracture nasal bone, proximal humerus. Pt states she bit through her bottom lip. Reports she had loss of consciousness for a short period. Head CT showed nasal fracture but no IC bleed. RUE is immobilized and being managed conservatively. She complains of "heaviness" feeling in head and eyes. She is still having intermittent headaches. She is complaining of balance problems, dizziness, still feels weak and has trouble focusing. She complains of dizziness with quick head turns. Denies vertigo. No prior history of concussion. She had Guillan Barre in 2005 that causes shakiness in arms since then but otherwise has done well. She reports some mild RLE  weakness since the [facility]. Denies any slurring of speech, difficulty, swallowing, or focal numbness/tingling/weakness since the concussion. ROS negative for red flags    Limitations Reading    Diagnostic tests --    Currently in Pain? Yes    Pain Score 4    Pain Location Shoulder    Pain Orientation Right    Pain Descriptors / Indicators Aching    Pain Type Acute pain    Pain Onset More than a month ago    Pain Frequency Intermittent          Past Medical History:  Diagnosis Date  . Anxiety   . Depression   . Guillain Barr syndrome ([place]) 2005  . Osteoporosis   . Squamous cell skin cancer 2011    Past Surgical History:  Procedure Laterality Date  . ABDOMINAL HYSTERECTOMY  1989  . APPENDECTOMY  1960  . BREAST SURGERY  1980   biopsy  . WRIST FRACTURE SURGERY Right 2015    There were no vitals filed for this visit.   TREATMENT   Neuromuscular Re-education   Balance Master  Large majority of session spent on Balance Master;  Sensory Organization Test performed with patient with a composite equilibrium score of 36. She demonstrates poor vestibular performance and majority of LOB prefers in the posterior direction. She favors her R side with respect to her COG. Pt scored below normative ranges with conditions 3-6. She suffered a fall with trial 1 of condition 4 and all three  trials of conditions 5 and 6.  Performed variety of training exercises with weight shifting and tracing with a variety of different conditions with platform static/dynamic and surrounding static/dynamic.   VOR X 1 exercise:  Patient performed VOR X 1 horizontal with forward/retro ambulation 3 reps of 35' each with conflicting background; verbal cues for technique. Patient reports increase in dizziness to 3-4/10 during this activity;   Airex  Feet apart eyes closed 30s x 3 with patient, LOB during first rep posteriorly; Pt reports that this is very challenging and significant trunk sway noted.            PT Education - 05/27/17 [phone]     Education provided Yes    Education Details Plan of care, results of Balance Master testing    Person(s) Educated Patient    Methods Explanation    Comprehension Verbalized understanding        Clinical Impression Statement Pt reporting increased dizziness this morning since waking. Otherwise her symptoms have been improving. She is performing HEP as prescribed and reports continued difficulty with all eyes closed activities. Majority of today's session spent on Pension scheme manager. Sensory organization test performed. Pt scored below normative values with condition 3-6. She had falls during first trial of condition 4 and all trials of conditions 5 and 6. Her equilibrium composite score is 36. Her vestibular component is the most impaired of all the systems contributing to her balance. She also tends to favor her RLE with respect to weight shifting and her predominant direction for loss of balance is posterior. Deferred DGI today due to increase in balance dizziness since waking. Will perform at next session as well as complete additional outcome measures and update goals. Will also continue to work on job related activities such as walking in crowded hospital areas while multi-taking and performing card sorting to simulate seated activities. It is highly likely that pt will need additional vestibular therapy given results of her sensory organization test today.    Rehab Potential Excellent    PT Frequency 1x / week    PT Duration 8 weeks    PT Treatment/Interventions Aquatic Therapy;ADLs/Self Care Home Management;Canalith Repostioning;Gait training;Stair training;DME Instruction;Functional mobility training;Therapeutic activities;Balance training;Therapeutic exercise;Neuromuscular re-education;Patient/family education;Manual techniques;Vestibular;Energy conservation    PT Next Visit Plan ambulation in medical mall/stairs, seated card sorting to simulate  desk activities, progress head turning activities with forward/retro ambulation, eyes closed activities, body rolls eyes open/closed    PT Home Exercise Plan Standing horizontal/vertical smooth pursuit with conflicting background and marching, standing horizontal/vertical saccades with conflicting background and marching, standing balance with feet in staggered stance on carpet with eyes closed (progress to tandem), VOR x 1 horizontal in standing with conflicting backround and while marching, semitandem balance with horizontal head/body turns;    Consulted and Agree with Plan of Care Patient       Patient will benefit from skilled therapeutic intervention in order to improve the following deficits and impairments: Decreased activity tolerance, Decreased balance, Dizziness  Visit Diagnosis:  Dizziness and giddiness  Unsteadiness on feet                PT Short Term Goals - 05/24/17 1629      PT SHORT TERM GOAL #1   Title  Pt will be independent with HEP in order to improve R shoulder ROM and strength in order to decrease fall risk and improve function at home and work.    Baseline  HEP given     Time  2    Period  Weeks    Status  New    Target Date  06/07/17      PT SHORT TERM GOAL #2   Title  Patient will report a worst pain of 5/10 on VAS in R shoulder   to improve tolerance with ADLs and reduced symptoms with activities.     Baseline  12/11: 7/10    Time  2    Period  Weeks    Status  New    Target Date  06/07/17      PT SHORT TERM GOAL #3   Title  Patient will improve R shoulder AROM flexion and abduction to 80 degrees to improve ability to don and doff clothes     Baseline  flexion: 72 degrees, abduction 54 degrees     Time  2    Period  Weeks    Status  New    Target Date  06/07/17        PT Long Term Goals - 05/24/17 1633      PT LONG TERM GOAL #1   Title  Pt will improve ABC by at least 13% in order to demonstrate clinically significant improvement in  balance confidence.     Baseline  04/15/17: 45%    Time  8    Period  Weeks    Status  New      PT LONG TERM GOAL #2   Title  Pt will decrease DHI score by at least 18 points in order to demonstrate clinically significant reduction in disability     Baseline  04/15/17: 68/100    Time  8    Status  New      PT LONG TERM GOAL #3   Title  Pt will improve DGI to 24/24 in order to demonstrate improved ability to perform head turns with ambulation as well as safety with stairs    Baseline  04/15/17: 21/24    Time  8    Period  Weeks    Status  New      PT LONG TERM GOAL #4   Title  Patient will report a worst pain of 3/10 on VAS in R shoulder  to improve tolerance with ADLs and reduced symptoms with activities.     Baseline  12/11: 7/10 pain    Time  6    Period  Weeks    Status  New    Target Date  07/05/17      PT LONG TERM GOAL #5   Title  Patient will improve shoulder AROM to > 140 degrees of flexion, scaption, and abduction for improved ability to perform overhead activities    Baseline  12/11: flexion 72, abduction 54    Time  6    Period  Weeks    Status  New    Target Date  07/05/17      Additional Long Term Goals   Additional Long Term Goals  Yes      PT LONG TERM GOAL #6   Title  Patient will decrease Quick DASH score by > 8 points (73.8%)  demonstrating reduced self-reported upper extremity disability.    Baseline  12/11: 81.8%    Time  6    Period  Weeks    Status  New    Target Date  07/05/17      PT LONG TERM GOAL #7   Title  Patient will decrease Quick DASH Work score by > 8  points demonstrating reduced self-reported upper extremity disability.    Baseline   12/11: 67%    Time  6    Period  Weeks    Status  New    Target Date  07/05/17              Patient will benefit from skilled therapeutic intervention in order to improve the following deficits and impairments:  Decreased activity tolerance, Decreased balance, Dizziness  Visit  Diagnosis: Muscle weakness (generalized)  Dizziness and giddiness     Problem List Patient Active Problem List   Diagnosis Date Noted  . Closed fracture of upper end of humerus 04/25/2017  . Concussion with loss of consciousness 04/10/2017  . 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 Encounter for screening colonoscopy 03/16/2013  . Routine general medical examination at a health care facility 03/16/2013  . Right leg weakness 01/23/2013  . History of Guillain-Barre syndrome 01/23/2013  . Radial fracture 01/23/2013  . 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 UNSPECIFIED VITAMIN D DEFICIENCY 08/26/2008  . UNSPECIFIED ANEMIA 08/26/2008  . Recurrent depressive disorder, current episode moderate ([place]) 07/29/2008  . Fatigue due to depression 07/29/2008  . WEIGHT LOSS 07/29/2008    This note was recreated from the original due to a technological issue for the visit on 05/27/17. Phillips Grout PT, DPT      Phillips Grout PT, DPT   Alphonzo Devera 06/02/2017, 10:57 AM  [place] MAIN [facility] SERVICES [address] [place], [place], [address] Phone: [phone]   Fax:  [phone]  Name: Holly [name] MRN: [id number] Date of Birth: [birth year]

## 2017-06-03 ENCOUNTER — Ambulatory Visit: Payer: PRIVATE HEALTH INSURANCE

## 2017-06-03 ENCOUNTER — Other Ambulatory Visit: Payer: Self-pay

## 2017-06-03 ENCOUNTER — Ambulatory Visit: Payer: Self-pay | Admitting: Speech Pathology

## 2017-06-03 VITALS — BP 165/80 | HR 72

## 2017-06-03 DIAGNOSIS — R41841 Cognitive communication deficit: Secondary | ICD-10-CM | POA: Diagnosis not present

## 2017-06-03 DIAGNOSIS — R2681 Unsteadiness on feet: Secondary | ICD-10-CM

## 2017-06-03 DIAGNOSIS — R42 Dizziness and giddiness: Secondary | ICD-10-CM

## 2017-06-03 NOTE — Therapy (Signed)
[place] MAIN [facility] SERVICES [address] [place], [place], [address] Phone: [phone]   Fax:  [phone]  Physical Therapy Treatment  Patient Details  Name: Holly [name] MRN: [id number] Date of Birth: 25-Nov-1945 Referring Provider: Kurtis Bushman, MD   Encounter Date: 06/03/2017  PT End of Session - 06/03/17 0901    Visit Number  8    Number of Visits  17    Date for PT Re-Evaluation  07/29/17    Authorization Type  workers compensation, 8 visits approved, visit 8/8 for concussion    PT Start Time  0903    PT Stop Time  0955    PT Time Calculation (min)  52 min    Equipment Utilized During Treatment  Gait belt    Activity Tolerance  Patient tolerated treatment well    Behavior During Therapy  [facility] for tasks assessed/performed       Past Medical History:  Diagnosis Date  . Anxiety   . Depression   . Guillain Barr syndrome ([place]) 2005  . Osteoporosis   . Squamous cell skin cancer 2011    Past Surgical History:  Procedure Laterality Date  . ABDOMINAL HYSTERECTOMY  1989  . APPENDECTOMY  1960  . BREAST SURGERY  1980   biopsy  . WRIST FRACTURE SURGERY Right 2015    Vitals:   06/03/17 0909  BP: (!) 165/80  Pulse: 72  SpO2: 100%    Subjective Assessment - 06/03/17 0900    Subjective  Pt reports that she is doing well today. She is starting to have the sensation again that her eyes are heavy and her vision is slightly blurry. She reports that her dizziness symptoms are approximately 75% better than when she started therapy. She states that the other day when she was getting therapy for her shoulder she was laying down and when she sat up she experienced some vertigo. 4/10 R shoulder pain upon arrival today. No specific questions at this time.     Pertinent History  Parts of history borrowed by MD. Patient reports on 10/13 she was going up a ramp and lost her footing, fell forward and onto right shoulder. Caused her to fracture  nasal bone, proximal humerus. Pt states she bit through her bottom lip. Reports she had loss of consciousness for a short period. Head CT showed nasal fracture but no IC bleed. RUE is immobilized and being managed conservatively. She complains of "heaviness" feeling in head and eyes. She is still having intermittent headaches. She is complaining of balance problems, dizziness, still feels weak and has trouble focusing. She complains of dizziness with quick head turns. Denies vertigo. No prior history of concussion. She had Guillan Barre in 2005 that causes shakiness in arms since then but otherwise has done well. She reports some mild RLE weakness since the [facility]. Denies any slurring of speech, difficulty, swallowing, or focal numbness/tingling/weakness since the concussion. ROS negative for red flags     Patient Stated Goals  to return to previous level of funcition.     Currently in Pain?  Yes    Pain Score  4     Pain Location  Shoulder    Pain Orientation  Right    Pain Descriptors / Indicators  Aching    Pain Type  Acute pain    Pain Onset  More than a month ago    Pain Frequency  Intermittent  [facility] PT Assessment - 06/03/17 0915      Observation/Other Assessments   Other Surveys   Other Surveys    Activities of Balance Confidence Scale (ABC Scale)   59.7%    Dizziness Handicap Inventory (DHI)   54/100      Dynamic Gait Index   Level Surface  Normal    Change in Gait Speed  Normal    Gait with Horizontal Head Turns  Mild Impairment    Gait with Vertical Head Turns  Normal    Gait and Pivot Turn  Normal    Step Over Obstacle  Normal    Step Around Obstacles  Normal    Steps  Normal    Total Score  23             TREATMENT   Neuromuscular Re-education  Pt completed DHI and ABC (unbilled); Scored outcome measures and discussed results with patient; Repeated DGI and patient scored 23/24; Updated goals with patient and discussed plan of  care;  Canalith Repositioning Dix-Hallpike testing negative on the R side and appears mildly positive on L side with 4/10 vertigo symptoms and a few very faint beats of upbeating L torsional nystagmus. Tested twice on left side with same result. Pt treated with two rounds of Epley maneuver with restesting between interventions and only 1-2/10 vertigo during retesting. Pt reports she has no contraindications with her RUE and reports only very mild discomfort when rolling onto her R shoulder with Epley. Only held in R sidelying/nose down position for 30s to avoid excessive time in sidelying on R shoulder. Educated about BPPV.               PT Education - 06/03/17 0901    Education provided  Yes    Education Details  Plan of care, goals, BPPV, and recertification    Person(s) Educated  Patient    Methods  Explanation    Comprehension  Verbalized understanding       PT Short Term Goals - 06/03/17 1306      PT SHORT TERM GOAL #1   Title  Pt will be independent with HEP in order to improve R shoulder ROM and strength in order to decrease fall risk and improve function at home and work.    Baseline  HEP given     Time  2    Period  Weeks    Status  On-going      PT SHORT TERM GOAL #2   Title  Patient will report a worst pain of 5/10 on VAS in R shoulder   to improve tolerance with ADLs and reduced symptoms with activities.     Baseline  12/11: 7/10    Time  2    Period  Weeks    Status  On-going      PT SHORT TERM GOAL #3   Title  Patient will improve R shoulder AROM flexion and abduction to 80 degrees to improve ability to don and doff clothes     Baseline  flexion: 72 degrees, abduction 54 degrees     Time  2    Period  Weeks    Status  On-going        PT Long Term Goals - 06/03/17 1013      PT LONG TERM GOAL #1   Title  Pt will improve ABC by at least 13% in order to demonstrate clinically significant improvement in balance confidence.     Baseline  04/15/17: 45%,  06/03/17: 59.7% initial goal achieved and advanced to an additional 13%    Time  8    Period  Weeks    Status  Revised    Target Date  07/29/17      PT LONG TERM GOAL #2   Title  Pt will decrease DHI score by at least 18 points in order to demonstrate clinically significant reduction in disability     Baseline  04/15/17: 68/100; 06/03/17: 54/100    Time  8    Period  Weeks    Status  Partially Met    Target Date  07/29/17      PT LONG TERM GOAL #3   Title  Pt will improve DGI to 24/24 in order to demonstrate improved ability to perform head turns with ambulation as well as safety with stairs    Baseline  04/15/17: 21/24; 06/03/17: 23/24    Time  8    Period  Weeks    Status  Partially Met    Target Date  07/29/17      PT LONG TERM GOAL #4   Title  Patient will report a worst pain of 3/10 on VAS in R shoulder  to improve tolerance with ADLs and reduced symptoms with activities.     Baseline  12/11: 7/10 pain    Time  6    Period  Weeks    Status  On-going    Target Date  07/05/17      PT LONG TERM GOAL #5   Title  Patient will improve shoulder AROM to > 140 degrees of flexion, scaption, and abduction for improved ability to perform overhead activities    Baseline  12/11: flexion 72, abduction 54    Time  6    Period  Weeks    Status  On-going    Target Date  07/05/17      PT LONG TERM GOAL #6   Title  Patient will decrease Quick DASH score by > 8 points (73.8%)  demonstrating reduced self-reported upper extremity disability.    Baseline  12/11: 81.8%    Time  6    Period  Weeks    Status  New    Target Date  07/05/17      PT LONG TERM GOAL #7   Title  Patient will decrease Quick DASH Work score by > 8 points demonstrating reduced self-reported upper extremity disability.    Baseline   12/11: 67%    Time  6    Period  Weeks    Status  New    Target Date  07/05/17            Plan - 06/03/17 0902    Clinical Impression Statement  Pt reports approximately 75%  improvement in her dizziness symptoms since starting therapy. Her DGI improved from 21/24 to 23/24 with only minor lateral gait deviation with vertical head turns. Her ABC has improved from 45% at initial evaluation to 59.7% today. However it is still below cut-off for increased fall risk and continues to demonstrate a significant lack of confidence in her balance. Her [place] also improved from 68/100 at initial evaluation to 54/100 today but demonstrates moderate perception of handicap. Sensory Organization Test performed last session on Geophysicist/field seismologist. Pt scored below normative values with condition 3-6. She had falls during first trial of condition 4 and all trials of conditions 5 and 6. Her equilibrium composite score is 36. Her vestibular component is the most impaired  of all the systems contributing to her balance. She also tends to favor her RLE with respect to weight shifting and her predominant direction for loss of balance is posterior. During session today pt states that the other day when she was getting therapy for her shoulder she was laying down and when she sat up she experienced some vertigo. She has also been having some vertigo when tipping her head back in the shower. Dix-Hallpike testing negative on the R side and appears mildly positive on L side with 4/10 vertigo symptoms and a few very faint beats of upbeating L torsional nystagmus. Tested twice on left side with same result. Pt treated with two rounds of Epley maneuver with restesting between interventions and only 1-2/10 vertigo during retesting. Pt has made significant improvement since starting with therapy however has not returned to her baseline and still presents with notable deficits related to dizziness. She will benefit from continued PT services to address deficits in dizziness and balance, and mobility in order to return to full function at home.     Clinical Presentation  Evolving    Clinical Decision Making  Moderate     Rehab Potential  Excellent    PT Frequency  1x / week    PT Duration  8 weeks    PT Treatment/Interventions  Aquatic Therapy;ADLs/Self Care Home Management;Canalith Repostioning;Gait training;Stair training;DME Instruction;Functional mobility training;Therapeutic activities;Balance training;Therapeutic exercise;Neuromuscular re-education;Patient/family education;Manual techniques;Vestibular;Energy conservation    PT Next Visit Plan  Retest L Dix-Hallpike and repeat Epley if indicated, ambulation in medical mall/stairs, seated card sorting to simulate desk activities, progress head turning activities with forward/retro ambulation, eyes closed activities, body rolls eyes open/closed    PT Home Exercise Plan  Standing horizontal/vertical smooth pursuit with conflicting background and marching, standing horizontal/vertical saccades with conflicting background and marching, standing balance with feet in staggered stance on carpet with eyes closed (progress to tandem), VOR x 1 horizontal in standing with conflicting backround and while marching, semitandem balance with horizontal head/body turns;    Consulted and Agree with Plan of Care  Patient       Patient will benefit from skilled therapeutic intervention in order to improve the following deficits and impairments:  Decreased activity tolerance, Decreased balance, Dizziness  Visit Diagnosis: Dizziness and giddiness  Unsteadiness on feet     Problem List Patient Active Problem List   Diagnosis Date Noted  . Closed fracture of upper end of humerus 04/25/2017  . Concussion with loss of consciousness 04/10/2017  . 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 Encounter for screening colonoscopy 03/16/2013  . Routine general medical examination at a health care facility 03/16/2013  . Right leg weakness 01/23/2013  . History of Guillain-Barre syndrome 01/23/2013  . Radial fracture 01/23/2013  . 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 UNSPECIFIED VITAMIN D  DEFICIENCY 08/26/2008  . UNSPECIFIED ANEMIA 08/26/2008  . Recurrent depressive disorder, current episode moderate ([place]) 07/29/2008  . Fatigue due to depression 07/29/2008  . WEIGHT LOSS 07/29/2008   Phillips Grout PT, DPT   Tiffancy Moger 06/03/2017, 1:24 PM  [place] MAIN [facility] SERVICES [address] [place], [place], [address] Phone: [phone]   Fax:  [phone]  Name: Holly [name] MRN: [id number] Date of Birth: 1945/09/23

## 2017-06-06 ENCOUNTER — Ambulatory Visit: Payer: PRIVATE HEALTH INSURANCE

## 2017-06-06 DIAGNOSIS — M25611 Stiffness of right shoulder, not elsewhere classified: Secondary | ICD-10-CM

## 2017-06-06 DIAGNOSIS — R41841 Cognitive communication deficit: Secondary | ICD-10-CM | POA: Diagnosis not present

## 2017-06-06 DIAGNOSIS — M25511 Pain in right shoulder: Secondary | ICD-10-CM

## 2017-06-06 DIAGNOSIS — M6281 Muscle weakness (generalized): Secondary | ICD-10-CM

## 2017-06-06 NOTE — Therapy (Signed)
[place] MAIN [facility] SERVICES [address] [place], [place], [address] Phone: [phone]   Fax:  [phone]  Physical Therapy Treatment  Patient Details  Name: Holly [name] MRN: [id number] Date of Birth: [birth year] Referring Provider: Kurtis Bushman, MD   Encounter Date: 06/06/2017  PT End of Session - 06/06/17 1052    Visit Number  3    Number of Visits  12    Date for PT Re-Evaluation  07/05/17    Authorization Type  no g codes/workers compensation    PT Start Time  1100    PT Stop Time  1145    PT Time Calculation (min)  45 min    Equipment Utilized During Treatment  Gait belt    Activity Tolerance  Patient tolerated treatment well    Behavior During Therapy  [facility] for tasks assessed/performed       Past Medical History:  Diagnosis Date  . Anxiety   . Depression   . Guillain Barr syndrome ([place]) 2005  . Osteoporosis   . Squamous cell skin cancer 2011    Past Surgical History:  Procedure Laterality Date  . ABDOMINAL HYSTERECTOMY  1989  . APPENDECTOMY  1960  . BREAST SURGERY  1980   biopsy  . WRIST FRACTURE SURGERY Right 2015    There were no vitals filed for this visit.  Subjective Assessment - 06/06/17 1100    Subjective  Patient reports having more movement in arm. Still having a hard with putting on bra. Unable to perform behind back tasks. Feels swollen.     Pertinent History  Parts of history borrowed by MD. Patient reports on 10/13 she was going up a ramp and lost her footing, fell forward and onto right shoulder. Caused her to fracture nasal bone, proximal humerus. Pt states she bit through her bottom lip. Reports she had loss of consciousness for a short period. Head CT showed nasal fracture but no IC bleed. RUE is immobilized and being managed conservatively. She complains of "heaviness" feeling in head and eyes. She is still having intermittent headaches. She is complaining of balance problems, dizziness, still  feels weak and has trouble focusing. She complains of dizziness with quick head turns. Denies vertigo. No prior history of concussion. She had Guillan Barre in 2005 that causes shakiness in arms since then but otherwise has done well. She reports some mild RLE weakness since the [facility]. Denies any slurring of speech, difficulty, swallowing, or focal numbness/tingling/weakness since the concussion. ROS negative for red flags     Limitations  Reading    Currently in Pain?  Yes    Pain Score  3     Pain Location  Shoulder    Pain Orientation  Right    Pain Descriptors / Indicators  Aching    Pain Type  Acute pain    Pain Onset  More than a month ago       PROM holds 10x 30 seconds             Flexion: stabilization above gh joint x6 and at scapula x 4             Abduction, stabilization above gh joint              IR with towel under shoulder to place into scaption plane             ER with shoulder placed into scaption plane by towel    Submaximal  RTC isometrics in neutral for IR, ER, EXT, FLEX and ABD 10x 5 seconds each position  AAROM to 90 degrees forward flexion and 40 degrees ER 10x each    Scapular PNF : superior inferior 12x, med lat 12x.  In sidelying      Education on sleeping positioning and use of pillows to decrease interruptions from shoulder pain.     Pt. response to medical necessity:     Patient will continue to benefit from skilled physical therapy to decrease R shoulder pain, improve ROM and strength, and return patient to previous level of function                         PT Education - 06/06/17 1151    Education provided  Yes    Education Details  AAROM with cane for flexion, and ER     Person(s) Educated  Patient    Methods  Explanation;Demonstration;Verbal cues    Comprehension  Verbalized understanding;Returned demonstration       PT Short Term Goals - 06/03/17 1306      PT SHORT TERM GOAL #1   Title  Pt will be  independent with HEP in order to improve R shoulder ROM and strength in order to decrease fall risk and improve function at home and work.    Baseline  HEP given     Time  2    Period  Weeks    Status  On-going      PT SHORT TERM GOAL #2   Title  Patient will report a worst pain of 5/10 on VAS in R shoulder   to improve tolerance with ADLs and reduced symptoms with activities.     Baseline  12/11: 7/10    Time  2    Period  Weeks    Status  On-going      PT SHORT TERM GOAL #3   Title  Patient will improve R shoulder AROM flexion and abduction to 80 degrees to improve ability to don and doff clothes     Baseline  flexion: 72 degrees, abduction 54 degrees     Time  2    Period  Weeks    Status  On-going        PT Long Term Goals - 06/03/17 1013      PT LONG TERM GOAL #1   Title  Pt will improve ABC by at least 13% in order to demonstrate clinically significant improvement in balance confidence.     Baseline  04/15/17: 45%, 06/03/17: 59.7% initial goal achieved and advanced to an additional 13%    Time  8    Period  Weeks    Status  Revised    Target Date  07/29/17      PT LONG TERM GOAL #2   Title  Pt will decrease DHI score by at least 18 points in order to demonstrate clinically significant reduction in disability     Baseline  04/15/17: 68/100; 06/03/17: 54/100    Time  8    Period  Weeks    Status  Partially Met    Target Date  07/29/17      PT LONG TERM GOAL #3   Title  Pt will improve DGI to 24/24 in order to demonstrate improved ability to perform head turns with ambulation as well as safety with stairs    Baseline  04/15/17: 21/24; 06/03/17: 23/24    Time  8  Period  Weeks    Status  Partially Met    Target Date  07/29/17      PT LONG TERM GOAL #4   Title  Patient will report a worst pain of 3/10 on VAS in R shoulder  to improve tolerance with ADLs and reduced symptoms with activities.     Baseline  12/11: 7/10 pain    Time  6    Period  Weeks    Status   On-going    Target Date  07/05/17      PT LONG TERM GOAL #5   Title  Patient will improve shoulder AROM to > 140 degrees of flexion, scaption, and abduction for improved ability to perform overhead activities    Baseline  12/11: flexion 72, abduction 54    Time  6    Period  Weeks    Status  On-going    Target Date  07/05/17      PT LONG TERM GOAL #6   Title  Patient will decrease Quick DASH score by > 8 points (73.8%)  demonstrating reduced self-reported upper extremity disability.    Baseline  12/11: 81.8%    Time  6    Period  Weeks    Status  New    Target Date  07/05/17      PT LONG TERM GOAL #7   Title  Patient will decrease Quick DASH Work score by > 8 points demonstrating reduced self-reported upper extremity disability.    Baseline   12/11: 67%    Time  6    Period  Weeks    Status  New    Target Date  07/05/17            Plan - 06/06/17 1200    Clinical Impression Statement  Patient educated on and performed supine AAROM with cane to 90 degree flexion and ER with frequent cueing required for decreased muscle activation of RUE. Patient scapula had improved mobility with repetitive movements as patient initially demonstrated protective tightening and posturing. Fear of pain limited patient PROM by inducing muscle activation of R shoulder, improved with repetition and prolonged hold. Patient will continue to benefit from skilled physical therapy to decrease R shoulder pain, improve ROM and strength, and return patient to previous level of function    Rehab Potential  Excellent    PT Frequency  1x / week    PT Duration  8 weeks    PT Treatment/Interventions  Aquatic Therapy;ADLs/Self Care Home Management;Canalith Repostioning;Gait training;Stair training;DME Instruction;Functional mobility training;Therapeutic activities;Balance training;Therapeutic exercise;Neuromuscular re-education;Patient/family education;Manual techniques;Vestibular;Energy conservation    PT Next  Visit Plan  Retest L Dix-Hallpike and repeat Epley if indicated, ambulation in medical mall/stairs, seated card sorting to simulate desk activities, progress head turning activities with forward/retro ambulation, eyes closed activities, body rolls eyes open/closed    PT Home Exercise Plan  Standing horizontal/vertical smooth pursuit with conflicting background and marching, standing horizontal/vertical saccades with conflicting background and marching, standing balance with feet in staggered stance on carpet with eyes closed (progress to tandem), VOR x 1 horizontal in standing with conflicting backround and while marching, semitandem balance with horizontal head/body turns;    Consulted and Agree with Plan of Care  Patient       Patient will benefit from skilled therapeutic intervention in order to improve the following deficits and impairments:  Decreased activity tolerance, Decreased balance, Dizziness  Visit Diagnosis: Acute pain of right shoulder  Stiffness of right shoulder, not elsewhere  classified  Muscle weakness (generalized)     Problem List Patient Active Problem List   Diagnosis Date Noted  . Closed fracture of upper end of humerus 04/25/2017  . Concussion with loss of consciousness 04/10/2017  . 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 Encounter for screening colonoscopy 03/16/2013  . Routine general medical examination at a health care facility 03/16/2013  . Right leg weakness 01/23/2013  . History of Guillain-Barre syndrome 01/23/2013  . Radial fracture 01/23/2013  . 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 UNSPECIFIED VITAMIN D DEFICIENCY 08/26/2008  . UNSPECIFIED ANEMIA 08/26/2008  . Recurrent depressive disorder, current episode moderate ([place]) 07/29/2008  . Fatigue due to depression 07/29/2008  . WEIGHT LOSS 07/29/2008   Janna Arch, PT, DPT   Janna Arch 06/06/2017, 12:01 PM  [place] MAIN [facility] SERVICES [address]  [place], [place], [address] Phone: [phone]   Fax:  [phone]  Name: Holly [name] MRN: [id number] Date of Birth: 08/21/1945

## 2017-06-13 ENCOUNTER — Ambulatory Visit: Payer: PRIVATE HEALTH INSURANCE | Attending: Anesthesiology | Admitting: Speech Pathology

## 2017-06-15 ENCOUNTER — Ambulatory Visit (INDEPENDENT_AMBULATORY_CARE_PROVIDER_SITE_OTHER): Payer: Worker's Compensation | Admitting: Family Medicine

## 2017-06-15 ENCOUNTER — Encounter: Payer: Self-pay | Admitting: Family Medicine

## 2017-06-15 VITALS — BP 155/84 | HR 80 | Ht 66.0 in | Wt 142.0 lb

## 2017-06-15 DIAGNOSIS — S060X9D Concussion with loss of consciousness of unspecified duration, subsequent encounter: Secondary | ICD-10-CM

## 2017-06-15 NOTE — Patient Instructions (Signed)
You have a concussion. Take tylenol if needed for headache. Ibuprofen or aleve only if needed beyond the tylenol. Try to avoid intense reading and minimize screen time. Ok for light cardio if this doesn't worsen your symptoms (walking, stationary bike). Continue vestibular and cognitive therapy - you are improving based on special testing between your initial and follow-up visits. Follow up with me in 4 weeks for reevaluation.

## 2017-06-16 ENCOUNTER — Ambulatory Visit: Payer: PRIVATE HEALTH INSURANCE | Attending: Orthopedic Surgery

## 2017-06-16 DIAGNOSIS — M25611 Stiffness of right shoulder, not elsewhere classified: Secondary | ICD-10-CM | POA: Insufficient documentation

## 2017-06-16 DIAGNOSIS — R2681 Unsteadiness on feet: Secondary | ICD-10-CM | POA: Insufficient documentation

## 2017-06-16 DIAGNOSIS — M25511 Pain in right shoulder: Secondary | ICD-10-CM | POA: Insufficient documentation

## 2017-06-16 DIAGNOSIS — R42 Dizziness and giddiness: Secondary | ICD-10-CM | POA: Diagnosis present

## 2017-06-16 DIAGNOSIS — R41841 Cognitive communication deficit: Secondary | ICD-10-CM | POA: Insufficient documentation

## 2017-06-16 DIAGNOSIS — M6281 Muscle weakness (generalized): Secondary | ICD-10-CM | POA: Diagnosis present

## 2017-06-16 NOTE — Therapy (Signed)
[place] MAIN [facility] SERVICES [address] [place], [place], [address] Phone: [phone]   Fax:  [phone]  Physical Therapy Treatment  Patient Details  Name: Holly [name] MRN: [id number] Date of Birth: [birth year] Referring Provider: Kurtis Bushman, MD   Encounter Date: 06/16/2017  PT End of Session - 06/16/17 1226    Visit Number  4    Number of Visits  12    Date for PT Re-Evaluation  07/05/17    Authorization Type  no g codes/workers compensation    PT Start Time  0945    PT Stop Time  1029    PT Time Calculation (min)  44 min    Equipment Utilized During Treatment  Gait belt    Activity Tolerance  Patient tolerated treatment well    Behavior During Therapy  [facility] for tasks assessed/performed       Past Medical History:  Diagnosis Date  . Anxiety   . Depression   . Guillain Barr syndrome ([place]) 2005  . Osteoporosis   . Squamous cell skin cancer 2011    Past Surgical History:  Procedure Laterality Date  . ABDOMINAL HYSTERECTOMY  1989  . APPENDECTOMY  1960  . BREAST SURGERY  1980   biopsy  . WRIST FRACTURE SURGERY Right 2015    There were no vitals filed for this visit.  Subjective Assessment - 06/16/17 0948    Subjective  patient reports going to concussion doctor since last session. Reports that she will continue to need vestibular. Feels R arm is getting better. When not moving it pain feels 1/10, when moving it 3/10    Pertinent History  Parts of history borrowed by MD. Patient reports on 10/13 she was going up a ramp and lost her footing, fell forward and onto right shoulder. Caused her to fracture nasal bone, proximal humerus. Pt states she bit through her bottom lip. Reports she had loss of consciousness for a short period. Head CT showed nasal fracture but no IC bleed. RUE is immobilized and being managed conservatively. She complains of "heaviness" feeling in head and eyes. She is still having intermittent headaches.  She is complaining of balance problems, dizziness, still feels weak and has trouble focusing. She complains of dizziness with quick head turns. Denies vertigo. No prior history of concussion. She had Guillan Barre in 2005 that causes shakiness in arms since then but otherwise has done well. She reports some mild RLE weakness since the [facility]. Denies any slurring of speech, difficulty, swallowing, or focal numbness/tingling/weakness since the concussion. ROS negative for red flags     Limitations  Reading    Currently in Pain?  Yes    Pain Score  3     Pain Location  Arm    Pain Orientation  Right    Pain Descriptors / Indicators  Aching    Pain Type  Acute pain    Pain Onset  More than a month ago        PROM holds 10x 30 seconds             Flexion: stabilization above gh joint x6 and at scapula x 4             Abduction, stabilization above gh joint              IR with towel under shoulder to place into scaption plane             ER with  shoulder placed into scaption plane by towel    Submaximal RTC isometrics in neutral for IR, ER, EXT, FLEX and ABD 10x 5 seconds each position   Standing AAROM to 90 degrees forward flexion and 40 degrees ER 10x each    Scapular PNF : superior inferior 12x, med lat 12x.  In sidelying    Sidelying ER 10x to PT's hand (neutral position )     Hands on knees to hands on hips to start process of putting on bra. 15x   Hands on knees to hands on elbows (bicep curl seated-no moving of elbow ) 10x   Education on sleeping positioning and use of pillows to decrease interruptions from shoulder pain.   Scapular retractions terminated after 4 due to clavicle pain     Pt. response to medical necessity:     Patient will continue to benefit from skilled physical therapy to decrease R shoulder pain, improve ROM and strength, and return patient to previous level of function                          PT Education - 06/16/17 1225     Education provided  Yes    Education Details  AAROM with cane, seated hands on knees to hands on hips to increase mobility in safe manner    Person(s) Educated  Patient    Methods  Explanation;Demonstration;Verbal cues    Comprehension  Verbalized understanding;Returned demonstration;Verbal cues required       PT Short Term Goals - 06/03/17 1306      PT SHORT TERM GOAL #1   Title  Pt will be independent with HEP in order to improve R shoulder ROM and strength in order to decrease fall risk and improve function at home and work.    Baseline  HEP given     Time  2    Period  Weeks    Status  On-going      PT SHORT TERM GOAL #2   Title  Patient will report a worst pain of 5/10 on VAS in R shoulder   to improve tolerance with ADLs and reduced symptoms with activities.     Baseline  12/11: 7/10    Time  2    Period  Weeks    Status  On-going      PT SHORT TERM GOAL #3   Title  Patient will improve R shoulder AROM flexion and abduction to 80 degrees to improve ability to don and doff clothes     Baseline  flexion: 72 degrees, abduction 54 degrees     Time  2    Period  Weeks    Status  On-going        PT Long Term Goals - 06/03/17 1013      PT LONG TERM GOAL #1   Title  Pt will improve ABC by at least 13% in order to demonstrate clinically significant improvement in balance confidence.     Baseline  04/15/17: 45%, 06/03/17: 59.7% initial goal achieved and advanced to an additional 13%    Time  8    Period  Weeks    Status  Revised    Target Date  07/29/17      PT LONG TERM GOAL #2   Title  Pt will decrease DHI score by at least 18 points in order to demonstrate clinically significant reduction in disability     Baseline  04/15/17: 68/100; 06/03/17: 54/100  Time  8    Period  Weeks    Status  Partially Met    Target Date  07/29/17      PT LONG TERM GOAL #3   Title  Pt will improve DGI to 24/24 in order to demonstrate improved ability to perform head turns with ambulation  as well as safety with stairs    Baseline  04/15/17: 21/24; 06/03/17: 23/24    Time  8    Period  Weeks    Status  Partially Met    Target Date  07/29/17      PT LONG TERM GOAL #4   Title  Patient will report a worst pain of 3/10 on VAS in R shoulder  to improve tolerance with ADLs and reduced symptoms with activities.     Baseline  12/11: 7/10 pain    Time  6    Period  Weeks    Status  On-going    Target Date  07/05/17      PT LONG TERM GOAL #5   Title  Patient will improve shoulder AROM to > 140 degrees of flexion, scaption, and abduction for improved ability to perform overhead activities    Baseline  12/11: flexion 72, abduction 54    Time  6    Period  Weeks    Status  On-going    Target Date  07/05/17      PT LONG TERM GOAL #6   Title  Patient will decrease Quick DASH score by > 8 points (73.8%)  demonstrating reduced self-reported upper extremity disability.    Baseline  12/11: 81.8%    Time  6    Period  Weeks    Status  New    Target Date  07/05/17      PT LONG TERM GOAL #7   Title  Patient will decrease Quick DASH Work score by > 8 points demonstrating reduced self-reported upper extremity disability.    Baseline   12/11: 67%    Time  6    Period  Weeks    Status  New    Target Date  07/05/17            Plan - 06/16/17 1226    Clinical Impression Statement   Patient progressing with functional movement with R shoulder. Supine AAROM to 90 flexion and 40 ER progressed to standing for increased muscle contraction and postural strengthening. Patient educated on steps of recovery/protocol and to limit activities that are painful to increase the healing process. Tight subscapular muscles noted and pain relieved with STM/trigger point to subscap region.  Patient will continue to benefit from skilled physical therapy to decrease R shoulder pain, improve ROM and strength, and return patient to previous level of function    Rehab Potential  Excellent    PT Frequency   1x / week    PT Duration  8 weeks    PT Treatment/Interventions  Aquatic Therapy;ADLs/Self Care Home Management;Canalith Repostioning;Gait training;Stair training;DME Instruction;Functional mobility training;Therapeutic activities;Balance training;Therapeutic exercise;Neuromuscular re-education;Patient/family education;Manual techniques;Vestibular;Energy conservation    PT Next Visit Plan  Retest L Dix-Hallpike and repeat Epley if indicated, ambulation in medical mall/stairs, seated card sorting to simulate desk activities, progress head turning activities with forward/retro ambulation, eyes closed activities, body rolls eyes open/closed    PT Home Exercise Plan  Standing horizontal/vertical smooth pursuit with conflicting background and marching, standing horizontal/vertical saccades with conflicting background and marching, standing balance with feet in staggered stance on carpet with eyes  closed (progress to tandem), VOR x 1 horizontal in standing with conflicting backround and while marching, semitandem balance with horizontal head/body turns;    Consulted and Agree with Plan of Care  Patient       Patient will benefit from skilled therapeutic intervention in order to improve the following deficits and impairments:  Decreased activity tolerance, Decreased balance, Dizziness  Visit Diagnosis: Acute pain of right shoulder  Stiffness of right shoulder, not elsewhere classified  Muscle weakness (generalized)     Problem List Patient Active Problem List   Diagnosis Date Noted  . Closed fracture of upper end of humerus 04/25/2017  . Concussion with loss of consciousness 04/10/2017  . 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 Encounter for screening colonoscopy 03/16/2013  . Routine general medical examination at a health care facility 03/16/2013  . Right leg weakness 01/23/2013  . History of Guillain-Barre syndrome 01/23/2013  . Radial fracture 01/23/2013  . 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   . UNSPECIFIED VITAMIN D DEFICIENCY 08/26/2008  . UNSPECIFIED ANEMIA 08/26/2008  . Recurrent depressive disorder, current episode moderate ([place]) 07/29/2008  . Fatigue due to depression 07/29/2008  . WEIGHT LOSS 07/29/2008   Janna Arch, PT, DPT   Janna Arch 06/16/2017, 12:27 PM  [place] MAIN [facility] SERVICES [address] [place], [place], [address] Phone: [phone]   Fax:  [phone]  Name: KETZIA GUZEK MRN: [id number] Date of Birth: [birth year]

## 2017-06-17 ENCOUNTER — Encounter: Payer: Self-pay | Admitting: Speech Pathology

## 2017-06-17 ENCOUNTER — Ambulatory Visit: Payer: PRIVATE HEALTH INSURANCE | Admitting: Speech Pathology

## 2017-06-17 ENCOUNTER — Other Ambulatory Visit: Payer: Self-pay

## 2017-06-17 DIAGNOSIS — M25511 Pain in right shoulder: Secondary | ICD-10-CM | POA: Diagnosis not present

## 2017-06-17 DIAGNOSIS — R41841 Cognitive communication deficit: Secondary | ICD-10-CM

## 2017-06-17 NOTE — Therapy (Signed)
[place] MAIN [facility] SERVICES [address] [place], [place], [address] Phone: [phone]   Fax:  [phone]  Speech Language Pathology Treatment  Patient Details  Name: Holly [name] MRN: [id number] Date of Birth: [birth year] Referring Provider: Dene Gentry    Encounter Date: 06/17/2017  End of Session - 06/17/17 1615    Visit Number  10    Date for SLP Re-Evaluation  07/18/17    SLP Start Time  1500    SLP Stop Time   1556    SLP Time Calculation (min)  56 min    Activity Tolerance  Patient tolerated treatment well       Past Medical History:  Diagnosis Date  . Anxiety   . Depression   . Guillain Barr syndrome ([place]) 2005  . Osteoporosis   . Squamous cell skin cancer 2011    Past Surgical History:  Procedure Laterality Date  . ABDOMINAL HYSTERECTOMY  1989  . APPENDECTOMY  1960  . BREAST SURGERY  1980   biopsy  . WRIST FRACTURE SURGERY Right 2015    There were no vitals filed for this visit.  Subjective Assessment - 06/17/17 1614    Subjective  The patient feels that she is improving, but still slow and having memory problems    Currently in Pain?  No/denies            ADULT SLP TREATMENT - 06/17/17 0001      General Information   Behavior/Cognition  Alert;Cooperative;Pleasant mood    HPI  Per MD: Patient is a 72 y.o. female here for concussion.  Patient reports on 10/13 she was going up a ramp and lost her footing, fell forward and onto right shoulder.  Caused her to fracture nasal bone, proximal humerus.  Reports she had loss of consciousness for a short period.  Had headache for 4-5 days with ~5/10 level of pain.  Balance problems, dizziness, still feels weak and has trouble focusing.  No nausea except from the percocet she took for fractures.  No prior history of concussion.  Feels things do not look 'quite straight' but denies wavy lines, loss of vision.  No weakness, numbness in extremities.  She had Guillan  Barre in 2005 that causes shakiness in arms since then but otherwise has done well.       Treatment Provided   Treatment provided  Cognitive-Linquistic      Pain Assessment   Pain Assessment  No/denies pain      Cognitive-Linquistic Treatment   Treatment focused on  Cognition;Patient/family/caregiver education    Skilled Treatment  COGNTION: Completed 3 simple Perplxor puzzles with min cues to recall "rules" (strategies to complete these puzzles) and to verbalize information provided in clue and what it means in regard to puzzle solving grid.  MEMORY: repeat -word list, without repetition/chunking of stimulus, with 20% accuracy and with chunking 80% accuracy; answer question RE: list with no difficulty.  Recall 5 pictured people and (unrelated) associated object with 90% accuracy.  Error was faulty information coding.      Assessment / Recommendations / Plan   Plan  Continue with current plan of care      Progression Toward Goals   Progression toward goals  Progressing toward goals       SLP Education - 06/17/17 1615    Education provided  Yes    Education Details  practice list recall    Person(s) Educated  Patient    Methods  Explanation    Comprehension  Verbalized understanding         SLP Long Term Goals - 06/01/17 1243      SLP LONG TERM GOAL #1   Title  Patient will identify cognitive-communication barriers and participate in developing functional compensatory strategies.    Time  4    Period  Weeks    Status  Partially Met    Target Date  07/01/17      SLP LONG TERM GOAL #2   Title  Patient will complete complex attention, executive function, memory, and visual-spatial activities with 80% accuracy.    Time  4    Period  Weeks    Status  Partially Met    Target Date  07/01/17      SLP LONG TERM GOAL #3   Title  Patient will demonstrate functional cognitive-communication skills for successful return to work.    Time  4    Period  Weeks    Status  Partially Met     Target Date  07/01/17      SLP LONG TERM GOAL #4   Title  Patient will generate grammatical, fluent, and cogent sentence to complete abstract/complex linguistic task with 80% accuracy.    Time  4    Period  Weeks    Status  Partially Met    Target Date  07/01/17       Plan - 06/17/17 1616    Clinical Impression Statement  The patient is able to complete simple cognitive tasks accurately at a slow pace.  The patient demonstrates adequate memory to complete more complex cognitive tasks.  However, she demonstrates increased anxiety as the complexity increases.  The patient is having difficulty with accurately and specifically stating reasoning in drawing conclusions in cognitive linguistic tasks (states "it's obvious" but cannot state the obvious).  Will continue ST to address using language skills to improve cognitive/reasoning skills and improve her ability to identify, and develop strategies to cope with, cognitive barriers to return to work.      Speech Therapy Frequency  2x / week    Duration  4 weeks    Treatment/Interventions  Cognitive reorganization;Internal/external aids;Compensatory strategies;SLP instruction and feedback;Patient/family education    Potential to Achieve Goals  Good    Potential Considerations  Ability to learn/carryover information;Co-morbidities;Cooperation/participation level;Medical prognosis;Previous level of function;Severity of impairments;Family/community support    SLP Home Exercise Plan  word list retention    Consulted and Agree with Plan of Care  Patient       Patient will benefit from skilled therapeutic intervention in order to improve the following deficits and impairments:   Cognitive communication deficit    Problem List Patient Active Problem List   Diagnosis Date Noted  . Closed fracture of upper end of humerus 04/25/2017  . Concussion with loss of consciousness 04/10/2017  . 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 Encounter for screening  colonoscopy 03/16/2013  . Routine general medical examination at a health care facility 03/16/2013  . Right leg weakness 01/23/2013  . History of Guillain-Barre syndrome 01/23/2013  . Radial fracture 01/23/2013  . 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 UNSPECIFIED VITAMIN D DEFICIENCY 08/26/2008  . UNSPECIFIED ANEMIA 08/26/2008  . Recurrent depressive disorder, current episode moderate ([place]) 07/29/2008  . Fatigue due to depression 07/29/2008  . WEIGHT LOSS 07/29/2008   Leroy Sea, MS/CCC- SLP  Lou Miner 06/17/2017, 4:17 PM  [place] MAIN [facility] SERVICES [place], [place],  [place] Phone: [phone]   Fax:  [phone]   Name: Holly [name] MRN: [id number] Date of Birth: 30-Apr-1946

## 2017-06-18 ENCOUNTER — Encounter: Payer: Self-pay | Admitting: Family Medicine

## 2017-06-18 NOTE — Assessment & Plan Note (Signed)
Patient's first concussion.  Not much change from visit 2 weeks ago - in fact severity of symptoms a little worse.  Exam about the same.  Will extend vestibular and cognitive rehab.  Tylenol, ibuprofen only if needed.  F/u in 4 weeks for reevaluation.  Total visit time 25 minutes - > 50% of which spent on counseling, answering questions.

## 2017-06-18 NOTE — Progress Notes (Signed)
PCP: Crecencio Mc, MD  Subjective:   HPI: Patient is a 72 y.o. female here for concussion.  10/25: Patient reports on 10/13 she was going up a ramp and lost her footing, fell forward and onto right shoulder. Caused her to fracture nasal bone, proximal humerus. Reports she had loss of consciousness for a short period. Had headache for 4-5 days with ~5/10 level of pain. Balance problems, dizziness, still feels weak and has trouble focusing. No nausea except from the percocet she took for fractures. No prior history of concussion. Feels things do not look 'quite straight' but denies wavy lines, loss of vision. No weakness, numbness in extremities. She had Guillan Barre in 2005 that causes shakiness in arms since then but otherwise has done well. SCAT 3 18/22 symptoms with score 82/132.  11/21: Patient reports she is doing well. Has done 2 visits of cognitive therapy and 4 visits of physical therapy.   No headache currently - last one two days ago. Still slow with concentration, difficulty expressing herself but improved. Has some dizziness, difficulty focusing at times. Waking up at night. No numbness or weakness. SCAT3 11/22 symptoms with score 33/132.  12/5: Patient reports she is improved since last visit. Has been to 5 visits of cognitive therapy, 6 visits of PT/vestibular rehab. Doing home exercises also. Finds the dizziness is the most frustrating but she has trouble with multitasking, concentrating, and gets tired faster. SCAT 3 9/22 symptoms, severity 16/132.  06/15/17: Patient reports headaches improving - last one 3 days ago. Not sleeping great. Feels something behind her eyes - heaviness, blurriness that comes and goes. Difficulty multitasking. Doing cognitive and PT/vestibular rehab but had about 1 week off of these due to holiday. SCAT3 9/22 symptoms, severity 32/132.  Past Medical History:  Diagnosis Date  . Anxiety   . Depression   . Guillain Barr  syndrome ([place]) 2005  . Osteoporosis   . Squamous cell skin cancer 2011    Current Outpatient Medications on File Prior to Visit  Medication Sig Dispense Refill  . ARIPiprazole (ABILIFY) 5 MG tablet Take 2.5 mg by mouth daily.    . Calcium Carbonate-Vitamin D (CALCIUM 600+D HIGH POTENCY PO) Take 1 tablet by mouth daily.    . Cholecalciferol (VITAMIN D3) 2000 UNITS CHEW Chew 1 capsule by mouth daily. Reported on 08/29/2015    . clorazepate (TRANXENE) 7.5 MG tablet Take 7.5 mg by mouth 2 (two) times daily.     Marland Kitchen FLUoxetine (PROZAC) 20 MG tablet Take 20 mg by mouth daily.     . Levomilnacipran HCl ER (FETZIMA) 20 MG CP24 Take 20 mg by mouth.    . oxyCODONE-acetaminophen (ROXICET) 5-325 MG tablet Take 1-2 tablets by mouth every 6 (six) hours as needed. (Patient not taking: Reported on 04/15/2017) 12 tablet 0  . tamsulosin (FLOMAX) 0.4 MG CAPS capsule Take 0.4 mg by mouth daily.  11  . temazepam (RESTORIL) 30 MG capsule Take 30 mg by mouth at bedtime as needed for sleep.    . traMADol (ULTRAM) 50 MG tablet Take by mouth every 6 (six) hours as needed.     No current facility-administered medications on file prior to visit.     Past Surgical History:  Procedure Laterality Date  . ABDOMINAL HYSTERECTOMY  1989  . APPENDECTOMY  1960  . BREAST SURGERY  1980   biopsy  . WRIST FRACTURE SURGERY Right 2015    Allergies  Allergen Reactions  . Oxycodone-Acetaminophen Nausea And Vomiting  . Cephalexin  Other reaction(s): HIVES    Social History   Socioeconomic History  . Marital status: Widowed    Spouse name: Not on file  . Number of children: Not on file  . Years of education: Not on file  . Highest education level: Not on file  Social Needs  . Financial resource strain: Not on file  . Food insecurity - worry: Not on file  . Food insecurity - inability: Not on file  . Transportation needs - medical: Not on file  . Transportation needs - non-medical: Not on file  Occupational  History  . Not on file  Tobacco Use  . Smoking status: Former Smoker    Packs/day: 0.20    Types: Cigarettes    Last attempt to quit: 06/14/1977    Years since quitting: 40.0  . Smokeless tobacco: Never Used  Substance and Sexual Activity  . Alcohol use: Yes    Alcohol/week: 0.0 oz    Comment: social (about once a month)  . Drug use: No  . Sexual activity: No  Other Topics Concern  . Not on file  Social History Narrative  . Not on file    Family History  Problem Relation Age of Onset  . Cancer Mother   . Mental illness Sister   . Bipolar disorder Sister   . Depression Brother   . Mental illness Brother   . Parkinson's disease Brother     BP (!) 155/84   Pulse 80   Ht 5\' 6"  (1.676 m)   Wt 142 lb (64.4 kg)   BMI 22.92 kg/m   Review of Systems: See HPI above.     Objective:  Physical Exam:  Gen: NAD, comfortable in exam room.  Neuro: Orientation 5/5 Immediate memory 14/15 Concentration 3/5 (5, 6 digits backwards) Neck FROM without pain. Balance 0 errors double leg, 2 errors tandem, unsteady single leg. Coordination normal finger to nose Delayed recall 1/5 Horizontal, vertical saccades 30 trials without symptoms. Fixed gaze 30 trials head rotation without symptoms.   Assessment & Plan:  1. Concussion with loss of consciousness - Patient's first concussion.  Not much change from visit 2 weeks ago - in fact severity of symptoms a little worse.  Exam about the same.  Will extend vestibular and cognitive rehab.  Tylenol, ibuprofen only if needed.  F/u in 4 weeks for reevaluation.  Total visit time 25 minutes - > 50% of which spent on counseling, answering questions.

## 2017-06-20 ENCOUNTER — Encounter: Payer: Self-pay | Admitting: Speech Pathology

## 2017-06-20 ENCOUNTER — Other Ambulatory Visit: Payer: Self-pay

## 2017-06-20 ENCOUNTER — Ambulatory Visit: Payer: PRIVATE HEALTH INSURANCE | Admitting: Speech Pathology

## 2017-06-20 DIAGNOSIS — R41841 Cognitive communication deficit: Secondary | ICD-10-CM

## 2017-06-20 DIAGNOSIS — M25511 Pain in right shoulder: Secondary | ICD-10-CM | POA: Diagnosis not present

## 2017-06-20 NOTE — Therapy (Signed)
[place] MAIN [facility] SERVICES [address] [place], [place], [address] Phone: [phone]   Fax:  [phone]  Speech Language Pathology Treatment  Patient Details  Name: Holly [name] MRN: [id number] Date of Birth: [birth year] Referring Provider: Dene Gentry    Encounter Date: 06/20/2017  End of Session - 06/20/17 1547    Visit Number  11    Number of Visits  17    Date for SLP Re-Evaluation  07/18/17    SLP Start Time  1400    SLP Stop Time   1457    SLP Time Calculation (min)  57 min    Activity Tolerance  Patient tolerated treatment well       Past Medical History:  Diagnosis Date  . Anxiety   . Depression   . Guillain Barr syndrome ([place]) 2005  . Osteoporosis   . Squamous cell skin cancer 2011    Past Surgical History:  Procedure Laterality Date  . ABDOMINAL HYSTERECTOMY  1989  . APPENDECTOMY  1960  . BREAST SURGERY  1980   biopsy  . WRIST FRACTURE SURGERY Right 2015    There were no vitals filed for this visit.  Subjective Assessment - 06/20/17 1546    Subjective  The patient feels that she is improving, but still slow and having memory problems    Currently in Pain?  No/denies            ADULT SLP TREATMENT - 06/20/17 0001      General Information   Behavior/Cognition  Alert;Cooperative;Pleasant mood    HPI  Per MD: Patient is a 72 y.o. female here for concussion.  Patient reports on 10/13 she was going up a ramp and lost her footing, fell forward and onto right shoulder.  Caused her to fracture nasal bone, proximal humerus.  Reports she had loss of consciousness for a short period.  Had headache for 4-5 days with ~5/10 level of pain.  Balance problems, dizziness, still feels weak and has trouble focusing.  No nausea except from the percocet she took for fractures.  No prior history of concussion.  Feels things do not look 'quite straight' but denies wavy lines, loss of vision.  No weakness, numbness in  extremities.  She had Guillan Barre in 2005 that causes shakiness in arms since then but otherwise has done well.       Treatment Provided   Treatment provided  Cognitive-Linquistic      Pain Assessment   Pain Assessment  No/denies pain      Cognitive-Linquistic Treatment   Treatment focused on  Cognition;Patient/family/caregiver education    Skilled Treatment  COGNTION: Completed 3 simple Perplxor puzzles with min cues to recall "rules" (strategies to complete these puzzles) and to verbalize information provided in clue and what it means in regard to puzzle solving grid.  Brainstorm potential solutions to hypothetical problems with min cues.      Assessment / Recommendations / Plan   Plan  Continue with current plan of care      Progression Toward Goals   Progression toward goals  Progressing toward goals       SLP Education - 06/20/17 1546    Education provided  Yes    Education Details  take the time needed to think through problems    Person(s) Educated  Patient    Methods  Explanation    Comprehension  Verbalized understanding         SLP Long Term  Goals - 06/01/17 1243      SLP LONG TERM GOAL #1   Title  Patient will identify cognitive-communication barriers and participate in developing functional compensatory strategies.    Time  4    Period  Weeks    Status  Partially Met    Target Date  07/01/17      SLP LONG TERM GOAL #2   Title  Patient will complete complex attention, executive function, memory, and visual-spatial activities with 80% accuracy.    Time  4    Period  Weeks    Status  Partially Met    Target Date  07/01/17      SLP LONG TERM GOAL #3   Title  Patient will demonstrate functional cognitive-communication skills for successful return to work.    Time  4    Period  Weeks    Status  Partially Met    Target Date  07/01/17      SLP LONG TERM GOAL #4   Title  Patient will generate grammatical, fluent, and cogent sentence to complete  abstract/complex linguistic task with 80% accuracy.    Time  4    Period  Weeks    Status  Partially Met    Target Date  07/01/17       Plan - 06/20/17 1547    Clinical Impression Statement  The patient is able to complete simple cognitive tasks accurately at a slow pace.  The patient demonstrates adequate memory to complete more complex cognitive tasks.  However, she demonstrates increased anxiety as the complexity increases.  The patient is having difficulty with accurately and specifically stating reasoning in drawing conclusions in cognitive linguistic tasks (states "it's obvious" but cannot state the obvious).  Will continue ST to address using language skills to improve cognitive/reasoning skills and improve her ability to identify, and develop strategies to cope with, cognitive barriers to return to work.      Speech Therapy Frequency  2x / week    Duration  4 weeks    Treatment/Interventions  Cognitive reorganization;Internal/external aids;Compensatory strategies;SLP instruction and feedback;Patient/family education    Potential to Achieve Goals  Good    Potential Considerations  Ability to learn/carryover information;Co-morbidities;Cooperation/participation level;Medical prognosis;Previous level of function;Severity of impairments;Family/community support    SLP Home Exercise Plan  word list retention    Consulted and Agree with Plan of Care  Patient       Patient will benefit from skilled therapeutic intervention in order to improve the following deficits and impairments:   Cognitive communication deficit    Problem List Patient Active Problem List   Diagnosis Date Noted  . Closed fracture of upper end of humerus 04/25/2017  . Concussion with loss of consciousness 04/10/2017  . 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 Encounter for screening colonoscopy 03/16/2013  . Routine general medical examination at a health care facility 03/16/2013  . Right leg weakness 01/23/2013  . History  of Guillain-Barre syndrome 01/23/2013  . Radial fracture 01/23/2013  . 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 UNSPECIFIED VITAMIN D DEFICIENCY 08/26/2008  . UNSPECIFIED ANEMIA 08/26/2008  . Recurrent depressive disorder, current episode moderate ([place]) 07/29/2008  . Fatigue due to depression 07/29/2008  . WEIGHT LOSS 07/29/2008   Leroy Sea, MS/CCC- SLP  Lou Miner 06/20/2017, 3:48 PM  [place] MAIN [facility] SERVICES [address] [place], [place], [address] Phone: [phone]   Fax:  [phone]   Name: Holly [name] MRN: [id number] Date of Birth:  04/18/1946 

## 2017-06-21 ENCOUNTER — Other Ambulatory Visit: Payer: Self-pay

## 2017-06-21 ENCOUNTER — Ambulatory Visit: Payer: PRIVATE HEALTH INSURANCE | Attending: Orthopedic Surgery

## 2017-06-21 DIAGNOSIS — R42 Dizziness and giddiness: Secondary | ICD-10-CM | POA: Insufficient documentation

## 2017-06-21 DIAGNOSIS — M25511 Pain in right shoulder: Secondary | ICD-10-CM | POA: Insufficient documentation

## 2017-06-21 DIAGNOSIS — R2681 Unsteadiness on feet: Secondary | ICD-10-CM | POA: Insufficient documentation

## 2017-06-21 DIAGNOSIS — M6281 Muscle weakness (generalized): Secondary | ICD-10-CM | POA: Diagnosis present

## 2017-06-21 DIAGNOSIS — M25611 Stiffness of right shoulder, not elsewhere classified: Secondary | ICD-10-CM | POA: Insufficient documentation

## 2017-06-21 NOTE — Therapy (Signed)
[facility] [address], [place], [address] Phone: [phone]   Fax:  [phone]  Physical Therapy Treatment  Patient Details  Name: Holly [name] MRN: [id number] Date of Birth: [birth year] Referring Provider: Kurtis Bushman, MD   Encounter Date: 06/21/2017  PT End of Session - 06/21/17 0952    Visit Number  13    Number of Visits  29    Date for PT Re-Evaluation  07/05/17    Authorization Type  no g codes, workers compensation    PT Start Time  [phone]    PT Stop Time  1055    PT Time Calculation (min)  60 min    Equipment Utilized During Treatment  Gait belt    Activity Tolerance  Patient tolerated treatment well    Behavior During Therapy  [facility] for tasks assessed/performed       Past Medical History:  Diagnosis Date  . Anxiety   . Depression   . Guillain Barr syndrome ([place]) 2005  . Osteoporosis   . Squamous cell skin cancer 2011    Past Surgical History:  Procedure Laterality Date  . ABDOMINAL HYSTERECTOMY  1989  . APPENDECTOMY  1960  . BREAST SURGERY  1980   biopsy  . WRIST FRACTURE SURGERY Right 2015    There were no vitals filed for this visit.  Subjective Assessment - 06/21/17 0952    Subjective  Pt states she is still having a continual "muscle pain" in her right shoulder. She notices it most when putting on her bra or when putting her car in park. She believes that her dizziness is improving. She felt well after the Epley during her last vestibular session. No further episodes of vertigo since last session. She saw her concussion specialist who retested her and kept her out of work until January 29th. No specific questions or concerns at this time.     Pertinent History  Parts of history borrowed by MD. Patient reports on 10/13 she was going up a ramp and lost her footing, fell forward and onto right shoulder. Caused her to fracture nasal bone, proximal humerus. Pt states she bit through  her bottom lip. Reports she had loss of consciousness for a short period. Head CT showed nasal fracture but no IC bleed. RUE is immobilized and being managed conservatively. She complains of "heaviness" feeling in head and eyes. She is still having intermittent headaches. She is complaining of balance problems, dizziness, still feels weak and has trouble focusing. She complains of dizziness with quick head turns. Denies vertigo. No prior history of concussion. She had Guillan Barre in 2005 that causes shakiness in arms since then but otherwise has done well. She reports some mild RLE weakness since the [facility]. Denies any slurring of speech, difficulty, swallowing, or focal numbness/tingling/weakness since the concussion. ROS negative for red flags     Limitations  Reading    Patient Stated Goals  to return to previous level of funcition.     Currently in Pain?  Yes    Pain Score  3     Pain Location  Arm    Pain Orientation  Right    Pain Descriptors / Indicators  Aching    Pain Type  Acute pain    Pain Onset  More than a month ago        TREATMENT  Ther-ex  PROM with 20-30s holds at end range in all planes x 30 seconds,  mild pain at end range; AAROM with canes for flexion and ER x 10 each with end range holds and mild pain at end range; Rythmic stabilization at 90 flexion with resistance just distal to [facility] joint 30s x 2, no pain; L sidelying R shoulder ER with shoulder placed into scaption plane by towel next to body 2 x 10, no pain; UE ranger for flexion x 10, mild end range pain; Seated pulleys for flexion and abduction x 10 each, cues to minimize shoulder shrug, mild end range pain; Pt provided pulleys for HEP; Scapular retractions x 10, mild pain in anterior shoulder, not sharp;  Neuromuscular Re-education  Repeated Epley maneuver to test L side and it is negative today with no vertigo and no nystagmus   VOR X 1 exercise:  Patient performed VOR X 1 horizontal with  forward/retro ambulation 3 reps of 35' each, verbal cues for technique. Patient reports increase in dizziness 5/10 during retro ambulation;  Incline Forward/retro ambulation on incline with horizontal ball pass with head/eye follow 20' x multiple bouts; Forward/retro ambulation on incline with alternating cone tapping and backwards counting by threes to challenge multitasking (pt unable to count backwards by 7) x multiple bouts. Increased sway noted and occasional slowing of gait;  Semitandem   Semitandem balance with alternating LE with horizontal head turns, increased sway with infrequent finger touching; Semitandem balance with eyes closed alternating LE, improving from prior sessions;  Consolidated HEP onto one sheet and reviewed with patient to ensure understanding;                 PT Education - 06/21/17 0952    Education provided  Yes    Education Details  Plan of care and HEP    Person(s) Educated  Patient    Methods  Explanation    Comprehension  Verbalized understanding       PT Short Term Goals - 06/03/17 1306      PT SHORT TERM GOAL #1   Title  Pt will be independent with HEP in order to improve R shoulder ROM and strength in order to decrease fall risk and improve function at home and work.    Baseline  HEP given     Time  2    Period  Weeks    Status  On-going      PT SHORT TERM GOAL #2   Title  Patient will report a worst pain of 5/10 on VAS in R shoulder   to improve tolerance with ADLs and reduced symptoms with activities.     Baseline  12/11: 7/10    Time  2    Period  Weeks    Status  On-going      PT SHORT TERM GOAL #3   Title  Patient will improve R shoulder AROM flexion and abduction to 80 degrees to improve ability to don and doff clothes     Baseline  flexion: 72 degrees, abduction 54 degrees     Time  2    Period  Weeks    Status  On-going        PT Long Term Goals - 06/03/17 1013      PT LONG TERM GOAL #1   Title  Pt will  improve ABC by at least 13% in order to demonstrate clinically significant improvement in balance confidence.     Baseline  04/15/17: 45%, 06/03/17: 59.7% initial goal achieved and advanced to an additional 13%    Time  8  Period  Weeks    Status  Revised    Target Date  07/29/17      PT LONG TERM GOAL #2   Title  Pt will decrease DHI score by at least 18 points in order to demonstrate clinically significant reduction in disability     Baseline  04/15/17: 68/100; 06/03/17: 54/100    Time  8    Period  Weeks    Status  Partially Met    Target Date  07/29/17      PT LONG TERM GOAL #3   Title  Pt will improve DGI to 24/24 in order to demonstrate improved ability to perform head turns with ambulation as well as safety with stairs    Baseline  04/15/17: 21/24; 06/03/17: 23/24    Time  8    Period  Weeks    Status  Partially Met    Target Date  07/29/17      PT LONG TERM GOAL #4   Title  Patient will report a worst pain of 3/10 on VAS in R shoulder  to improve tolerance with ADLs and reduced symptoms with activities.     Baseline  12/11: 7/10 pain    Time  6    Period  Weeks    Status  On-going    Target Date  07/05/17      PT LONG TERM GOAL #5   Title  Patient will improve shoulder AROM to > 140 degrees of flexion, scaption, and abduction for improved ability to perform overhead activities    Baseline  12/11: flexion 72, abduction 54    Time  6    Period  Weeks    Status  On-going    Target Date  07/05/17      PT LONG TERM GOAL #6   Title  Patient will decrease Quick DASH score by > 8 points (73.8%)  demonstrating reduced self-reported upper extremity disability.    Baseline  12/11: 81.8%    Time  6    Period  Weeks    Status  New    Target Date  07/05/17      PT LONG TERM GOAL #7   Title  Patient will decrease Quick DASH Work score by > 8 points demonstrating reduced self-reported upper extremity disability.    Baseline   12/11: 67%    Time  6    Period  Weeks    Status   New    Target Date  07/05/17            Plan - 06/21/17 1119    Clinical Impression Statement  Pt demonstrates good progres with therapy on this date. Repeat Dix-Hallpike testing is negative today for vertigo or nystagmus. She is able to progress her PROM and AAROM. Pt issued a set of pulleys to progress her AAROM flexion and abduction. Encouraged her to continue to use the cane at home for flexion and ER. Pt reports dizziness with VOR x 1 horizontal with retro ambulation. She also struggles with multitasking while ascend/descending ramp. Pt provided consolidated HEP and encouraged to follow-up as scheduled.     Rehab Potential  Excellent    PT Frequency  2x / week    PT Duration  8 weeks    PT Treatment/Interventions  Aquatic Therapy;ADLs/Self Care Home Management;Canalith Repostioning;Gait training;Stair training;DME Instruction;Functional mobility training;Therapeutic activities;Balance training;Therapeutic exercise;Neuromuscular re-education;Patient/family education;Manual techniques;Vestibular;Energy conservation    PT Next Visit Plan  Retest L Dix-Hallpike and repeat Epley if indicated,  ambulation in medical mall/stairs, seated card sorting to simulate desk activities, progress head turning activities with forward/retro ambulation, eyes closed activities, body rolls eyes open/closed    PT Home Exercise Plan  Update 06/21/17: forward/retro ambulation with VOR x 1 horizontal, semitandem balance with horizontal head/body turns, semitandem/tandem balance with eyes closed, pulley for R shoulder flexion/abduction, canes for R shoulder flexion/ER, side/back slides for R shoulder IR to assist donning/doffing bra; Prior HEP during therapy: Standing horizontal/vertical smooth pursuit with conflicting background and marching, standing horizontal/vertical saccades with conflicting background and marching, standing balance with feet in staggered stance on carpet with eyes closed (progress to tandem), VOR x  1 horizontal in standing with conflicting backround and while marching, semitandem balance with horizontal head/body turns;    Consulted and Agree with Plan of Care  Patient       Patient will benefit from skilled therapeutic intervention in order to improve the following deficits and impairments:  Decreased activity tolerance, Decreased balance, Dizziness  Visit Diagnosis: Acute pain of right shoulder  Stiffness of right shoulder, not elsewhere classified  Muscle weakness (generalized)  Dizziness and giddiness  Unsteadiness on feet     Problem List Patient Active Problem List   Diagnosis Date Noted  . Closed fracture of upper end of humerus 04/25/2017  . Concussion with loss of consciousness 04/10/2017  . 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 Encounter for screening colonoscopy 03/16/2013  . Routine general medical examination at a health care facility 03/16/2013  . Right leg weakness 01/23/2013  . History of Guillain-Barre syndrome 01/23/2013  . Radial fracture 01/23/2013  . 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 UNSPECIFIED VITAMIN D DEFICIENCY 08/26/2008  . UNSPECIFIED ANEMIA 08/26/2008  . Recurrent depressive disorder, current episode moderate ([place]) 07/29/2008  . Fatigue due to depression 07/29/2008  . WEIGHT LOSS 07/29/2008   Phillips Grout PT, DPT   Amyla Heffner 06/21/2017, 11:37 AM  [facility] [address] [place], [address] Phone: [phone]   Fax:  [phone]  Name: NOVICE VRBA MRN: [id number] Date of Birth: 09-18-1945

## 2017-06-22 ENCOUNTER — Other Ambulatory Visit: Payer: Self-pay

## 2017-06-22 ENCOUNTER — Encounter: Payer: Self-pay | Admitting: Speech Pathology

## 2017-06-22 ENCOUNTER — Ambulatory Visit: Payer: PRIVATE HEALTH INSURANCE | Attending: Anesthesiology | Admitting: Speech Pathology

## 2017-06-22 DIAGNOSIS — R41841 Cognitive communication deficit: Secondary | ICD-10-CM | POA: Insufficient documentation

## 2017-06-22 DIAGNOSIS — R42 Dizziness and giddiness: Secondary | ICD-10-CM | POA: Diagnosis present

## 2017-06-22 DIAGNOSIS — M25611 Stiffness of right shoulder, not elsewhere classified: Secondary | ICD-10-CM | POA: Diagnosis present

## 2017-06-22 DIAGNOSIS — M25511 Pain in right shoulder: Secondary | ICD-10-CM | POA: Insufficient documentation

## 2017-06-22 NOTE — Therapy (Signed)
[facility] [address] [place], [place], [address] Phone: [phone]   Fax:  [phone]  Speech Language Pathology Treatment  Patient Details  Name: Holly [name] MRN: [id number] Date of Birth: [birth year] Referring Provider: Dene Gentry    Encounter Date: 06/22/2017  End of Session - 06/22/17 1708    Visit Number  12    Number of Visits  17    Date for SLP Re-Evaluation  07/18/17    SLP Start Time  1500    SLP Stop Time   1550    SLP Time Calculation (min)  50 min    Activity Tolerance  Patient tolerated treatment well       Past Medical History:  Diagnosis Date  . Anxiety   . Depression   . Guillain Barr syndrome ([place]) 2005  . Osteoporosis   . Squamous cell skin cancer 2011    Past Surgical History:  Procedure Laterality Date  . ABDOMINAL HYSTERECTOMY  1989  . APPENDECTOMY  1960  . BREAST SURGERY  1980   biopsy  . WRIST FRACTURE SURGERY Right 2015    There were no vitals filed for this visit.  Subjective Assessment - 06/22/17 1706    Subjective  Patient states that the "heavy feeling" behind her eyes is improved    Currently in Pain?  No/denies            ADULT SLP TREATMENT - 06/22/17 0001      General Information   Behavior/Cognition  Alert;Cooperative;Pleasant mood    HPI  Per MD: Patient is a 72 y.o. female here for concussion.  Patient reports on 10/13 she was going up a ramp and lost her footing, fell forward and onto right shoulder.  Caused her to fracture nasal bone, proximal humerus.  Reports she had loss of consciousness for a short period.  Had headache for 4-5 days with ~5/10 level of pain.  Balance problems, dizziness, still feels weak and has trouble focusing.  No nausea except from the percocet she took for fractures.  No prior history of concussion.  Feels things do not look 'quite straight' but denies wavy lines, loss of vision.  No weakness, numbness in extremities.  She  had Guillan Barre in 2005 that causes shakiness in arms since then but otherwise has done well.       Treatment Provided   Treatment provided  Cognitive-Linquistic      Pain Assessment   Pain Assessment  No/denies pain      Cognitive-Linquistic Treatment   Treatment focused on  Cognition;Patient/family/caregiver education    Skilled Treatment  COGNTION: Completed 2 simple Perplexor puzzles with mod cues to incorporate a new reasoning strategy and min cues to recall "rules" (strategies to complete these puzzles) and to verbalize information provided in clue and what it means in regard to puzzle solving grid.  Verbalize conclusions drawn from word problems with min cues to clarify statements and accurately read the stimulus.  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      Assessment / Recommendations / Plan   Plan  Continue with current plan of care      Progression Toward Goals   Progression toward goals  Progressing toward goals       SLP Education - 06/22/17 1707    Education provided  Yes    Education Details  Clear language helps express clear thoguhts  Person(s) Educated  Patient    Methods  Explanation    Comprehension  Verbalized understanding         SLP Long Term Goals - 06/01/17 1243      SLP LONG TERM GOAL #1   Title  Patient will identify cognitive-communication barriers and participate in developing functional compensatory strategies.    Time  4    Period  Weeks    Status  Partially Met    Target Date  07/01/17      SLP LONG TERM GOAL #2   Title  Patient will complete complex attention, executive function, memory, and visual-spatial activities with 80% accuracy.    Time  4    Period  Weeks    Status  Partially Met    Target Date  07/01/17      SLP LONG TERM GOAL #3   Title  Patient will demonstrate functional cognitive-communication skills for successful return to work.    Time  4     Period  Weeks    Status  Partially Met    Target Date  07/01/17      SLP LONG TERM GOAL #4   Title  Patient will generate grammatical, fluent, and cogent sentence to complete abstract/complex linguistic task with 80% accuracy.    Time  4    Period  Weeks    Status  Partially Met    Target Date  07/01/17       Plan - 06/22/17 1708    Clinical Impression Statement  The patient is able to complete simple cognitive tasks accurately at a slow pace.  The patient demonstrates adequate memory to complete more complex cognitive tasks.  However, she demonstrates increased anxiety as the complexity increases.  The patient is having difficulty with accurately and specifically stating reasoning in drawing conclusions in cognitive linguistic tasks (states "it's obvious" but cannot state the obvious).  Will continue ST to address using language skills to improve cognitive/reasoning skills and improve her ability to identify, and develop strategies to cope with, cognitive barriers to return to work.      Speech Therapy Frequency  2x / week    Duration  4 weeks    Treatment/Interventions  Cognitive reorganization;Internal/external aids;Compensatory strategies;SLP instruction and feedback;Patient/family education    Potential to Achieve Goals  Good    Potential Considerations  Ability to learn/carryover information;Co-morbidities;Cooperation/participation level;Medical prognosis;Previous level of function;Severity of impairments;Family/community support    SLP Home Exercise Plan  Self organization worksheets    Consulted and Agree with Plan of Care  Patient       Patient will benefit from skilled therapeutic intervention in order to improve the following deficits and impairments:   Cognitive communication deficit    Problem List Patient Active Problem List   Diagnosis Date Noted  . Closed fracture of upper end of humerus 04/25/2017  . Concussion with loss of consciousness 04/10/2017  . Intention  tremor 08/31/2015  . Encounter for screening colonoscopy 03/16/2013  . Routine general medical examination at a health care facility 03/16/2013  . Right leg weakness 01/23/2013  . History of Guillain-Barre syndrome 01/23/2013  . Radial fracture 01/23/2013  . 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 UNSPECIFIED VITAMIN D DEFICIENCY 08/26/2008  . UNSPECIFIED ANEMIA 08/26/2008  . Recurrent depressive disorder, current episode moderate ([place]) 07/29/2008  . Fatigue due to depression 07/29/2008  . WEIGHT LOSS 07/29/2008   Leroy Sea, MS/CCC- SLP  Lou Miner 06/22/2017, 5:10 PM  [place]  MAIN [facility] SERVICES [place], [place], [address] Phone: [phone]   Fax:  [phone]   Name: Holly [name] MRN: [id number] Date of Birth: [birth year]

## 2017-06-23 ENCOUNTER — Other Ambulatory Visit: Payer: Self-pay

## 2017-06-23 ENCOUNTER — Ambulatory Visit: Payer: PRIVATE HEALTH INSURANCE

## 2017-06-23 DIAGNOSIS — R42 Dizziness and giddiness: Secondary | ICD-10-CM

## 2017-06-23 DIAGNOSIS — M25611 Stiffness of right shoulder, not elsewhere classified: Secondary | ICD-10-CM

## 2017-06-23 DIAGNOSIS — M25511 Pain in right shoulder: Secondary | ICD-10-CM | POA: Diagnosis not present

## 2017-06-23 DIAGNOSIS — M6281 Muscle weakness (generalized): Secondary | ICD-10-CM

## 2017-06-23 NOTE — Therapy (Signed)
[facility] [address] [place], [address] Phone: [phone]   Fax:  [phone]  Physical Therapy Treatment  Patient Details  Name: Holly [name] MRN: [id number] Date of Birth: Dec 28, 1945 Referring Provider: Kurtis Bushman, MD   Encounter Date: 06/23/2017  PT End of Session - 06/23/17 1208    Visit Number  14    Number of Visits  29    Date for PT Re-Evaluation  07/05/17    Authorization Type  no g codes, workers compensation    PT Start Time  0945    PT Stop Time  1050    PT Time Calculation (min)  65 min    Equipment Utilized During Treatment  Gait belt    Activity Tolerance  Patient tolerated treatment well    Behavior During Therapy  [facility] for tasks assessed/performed       Past Medical History:  Diagnosis Date  . Anxiety   . Depression   . Guillain Barr syndrome ([place]) 2005  . Osteoporosis   . Squamous cell skin cancer 2011    Past Surgical History:  Procedure Laterality Date  . ABDOMINAL HYSTERECTOMY  1989  . APPENDECTOMY  1960  . BREAST SURGERY  1980   biopsy  . WRIST FRACTURE SURGERY Right 2015    There were no vitals filed for this visit.  Subjective Assessment - 06/23/17 1207    Pertinent History  Parts of history borrowed by MD. Patient reports on 10/13 she was going up a ramp and lost her footing, fell forward and onto right shoulder. Caused her to fracture nasal bone, proximal humerus. Pt states she bit through her bottom lip. Reports she had loss of consciousness for a short period. Head CT showed nasal fracture but no IC bleed. RUE is immobilized and being managed conservatively. She complains of "heaviness" feeling in head and eyes. She is still having intermittent headaches. She is complaining of balance problems, dizziness, still feels weak and has trouble focusing. She complains of dizziness with quick head turns. Denies vertigo. No prior history of concussion. She had Guillan  Barre in 2005 that causes shakiness in arms since then but otherwise has done well. She reports some mild RLE weakness since the [facility]. Denies any slurring of speech, difficulty, swallowing, or focal numbness/tingling/weakness since the concussion. ROS negative for red flags     Limitations  Reading    Patient Stated Goals  to return to previous level of funcition.     Currently in Pain?  Yes    Pain Score  3     Pain Location  Arm    Pain Orientation  Right;Upper    Pain Descriptors / Indicators  Aching    Pain Type  Acute pain    Pain Onset  More than a month ago    Pain Frequency  Intermittent         [facility] PT Assessment - 06/23/17 1217      ROM / Strength   AROM / PROM / Strength  AROM      AROM   AROM Assessment Site  Shoulder    Right/Left Shoulder  Right;Left    Right Shoulder Flexion  135 Degrees    Right Shoulder ABduction  115 Degrees    Right Shoulder Internal Rotation  60 Degrees    Right Shoulder External Rotation  68 Degrees    Left Shoulder Flexion  160 Degrees    Left Shoulder ABduction  160 Degrees    Left Shoulder Internal Rotation  70 Degrees    Left Shoulder External Rotation  85 Degrees          TREATMENT  Ther-ex  PROM with 20-30s holds at end range in all planes x 30 seconds, mild pain at end range; Supine R shoulder AROM flexion to comfortable end range x 10; L sidelying R shoulder abduction to comfortable end range x 10; L sidelying R shoulder ER with shoulder placed into scaption plane by towel next to body 2 x 10, no pain; Scapular retractions in sitting x 10, no pain today;  R shoulder ROM measures: 135 flexion RUE, 160 LUE 115 abduction R, 160 LUE 68 ER R,8  85 L  60 IR R, 70 L  Neuromuscular Re-education   VOR X 1 exercise: Patient performed VOR X 1 horizontal with forward/retro ambulation 3 reps of 35' each, verbal cues for technique. Patient reports increase in dizziness 5/10 during retro ambulation;  Ball pass Retro  ambulation with ball pass, head/eye follow 35' x 3; Airex balance with feet together with horizontal ball pass and head/eye follow;  Airex Airex balance with eyes closed x 30s, multiple bouts; Airex balance with eyes closed marching 30s x multiple bouts;  Estim IFC to R shoulder with cold pack applied x 15 minutes at pt tolerated intensity, pt reports significant improvement in pain following estim.                   PT Education - 06/23/17 1208    Education provided  Yes    Education Details  Progression of HEP    Person(s) Educated  Patient    Methods  Explanation    Comprehension  Verbalized understanding       PT Short Term Goals - 06/03/17 1306      PT SHORT TERM GOAL #1   Title  Pt will be independent with HEP in order to improve R shoulder ROM and strength in order to decrease fall risk and improve function at home and work.    Baseline  HEP given     Time  2    Period  Weeks    Status  On-going      PT SHORT TERM GOAL #2   Title  Patient will report a worst pain of 5/10 on VAS in R shoulder   to improve tolerance with ADLs and reduced symptoms with activities.     Baseline  12/11: 7/10    Time  2    Period  Weeks    Status  On-going      PT SHORT TERM GOAL #3   Title  Patient will improve R shoulder AROM flexion and abduction to 80 degrees to improve ability to don and doff clothes     Baseline  flexion: 72 degrees, abduction 54 degrees     Time  2    Period  Weeks    Status  On-going        PT Long Term Goals - 06/03/17 1013      PT LONG TERM GOAL #1   Title  Pt will improve ABC by at least 13% in order to demonstrate clinically significant improvement in balance confidence.     Baseline  04/15/17: 45%, 06/03/17: 59.7% initial goal achieved and advanced to an additional 13%    Time  8    Period  Weeks    Status  Revised    Target Date  07/29/17  PT LONG TERM GOAL #2   Title  Pt will decrease DHI score by at least 18 points in order  to demonstrate clinically significant reduction in disability     Baseline  04/15/17: 68/100; 06/03/17: 54/100    Time  8    Period  Weeks    Status  Partially Met    Target Date  07/29/17      PT LONG TERM GOAL #3   Title  Pt will improve DGI to 24/24 in order to demonstrate improved ability to perform head turns with ambulation as well as safety with stairs    Baseline  04/15/17: 21/24; 06/03/17: 23/24    Time  8    Period  Weeks    Status  Partially Met    Target Date  07/29/17      PT LONG TERM GOAL #4   Title  Patient will report a worst pain of 3/10 on VAS in R shoulder  to improve tolerance with ADLs and reduced symptoms with activities.     Baseline  12/11: 7/10 pain    Time  6    Period  Weeks    Status  On-going    Target Date  07/05/17      PT LONG TERM GOAL #5   Title  Patient will improve shoulder AROM to > 140 degrees of flexion, scaption, and abduction for improved ability to perform overhead activities    Baseline  12/11: flexion 72, abduction 54    Time  6    Period  Weeks    Status  On-going    Target Date  07/05/17      PT LONG TERM GOAL #6   Title  Patient will decrease Quick DASH score by > 8 points (73.8%)  demonstrating reduced self-reported upper extremity disability.    Baseline  12/11: 81.8%    Time  6    Period  Weeks    Status  New    Target Date  07/05/17      PT LONG TERM GOAL #7   Title  Patient will decrease Quick DASH Work score by > 8 points demonstrating reduced self-reported upper extremity disability.    Baseline   12/11: 67%    Time  6    Period  Weeks    Status  New    Target Date  07/05/17            Plan - 06/23/17 1209    Clinical Impression Statement  Pt demonstrates progression of R shoulder flexion and abduction ROM today compared to last session. Measures obtained to track progress. She is most limited in abduction and external rotation of R shoulder. She reports less pain today during session and responds well to  electrical stimulation and ice at the end of session for pain control. Pt encouraged to progress to supine AROM flexion and sidelying AROM abduction to comfortable end range. Her dizziness is less pronounced today during VOR x 1 horizontal with retro ambulation. She continues to demonstrate increased sway with eyes closed on foam surface but this also appears to be improving. Pt enocuraged to continue additional HEP and follow-up as scheduled.     Rehab Potential  Excellent    PT Frequency  2x / week    PT Duration  8 weeks    PT Treatment/Interventions  Aquatic Therapy;ADLs/Self Care Home Management;Canalith Repostioning;Gait training;Stair training;DME Instruction;Functional mobility training;Therapeutic activities;Balance training;Therapeutic exercise;Neuromuscular re-education;Patient/family education;Manual techniques;Vestibular;Energy conservation    PT Next Visit  Plan  Recheck outcome measures as appropriate to update ortho MD (RUE fracture), seated card sorting to simulate desk activities, progress head turning activities with forward/retro ambulation, eyes closed activities, body rolls eyes open/closed    PT Home Exercise Plan  06/23/17: add supine AROM R shoulder flexion and L sidelying AROM abduction; Update 06/21/17: forward/retro ambulation with VOR x 1 horizontal, semitandem balance with horizontal head/body turns, semitandem/tandem balance with eyes closed, pulley for R shoulder flexion/abduction, canes for R shoulder flexion/ER, side/back slides for R shoulder IR to assist donning/doffing bra; Prior HEP during therapy: Standing horizontal/vertical smooth pursuit with conflicting background and marching, standing horizontal/vertical saccades with conflicting background and marching, standing balance with feet in staggered stance on carpet with eyes closed (progress to tandem), VOR x 1 horizontal in standing with conflicting backround and while marching, semitandem balance with horizontal head/body  turns;    Consulted and Agree with Plan of Care  Patient       Patient will benefit from skilled therapeutic intervention in order to improve the following deficits and impairments:  Decreased activity tolerance, Decreased balance, Dizziness  Visit Diagnosis: Acute pain of right shoulder  Stiffness of right shoulder, not elsewhere classified  Muscle weakness (generalized)  Dizziness and giddiness     Problem List Patient Active Problem List   Diagnosis Date Noted  . Closed fracture of upper end of humerus 04/25/2017  . Concussion with loss of consciousness 04/10/2017  . 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 Encounter for screening colonoscopy 03/16/2013  . Routine general medical examination at a health care facility 03/16/2013  . Right leg weakness 01/23/2013  . History of Guillain-Barre syndrome 01/23/2013  . Radial fracture 01/23/2013  . 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 UNSPECIFIED VITAMIN D DEFICIENCY 08/26/2008  . UNSPECIFIED ANEMIA 08/26/2008  . Recurrent depressive disorder, current episode moderate ([place]) 07/29/2008  . Fatigue due to depression 07/29/2008  . WEIGHT LOSS 07/29/2008   Phillips Grout PT, DPT   Holly [name] 06/23/2017, 12:18 PM  [facility] [address], [place], [address] Phone: [phone]   Fax:  [phone]  Name: Holly [name] MRN: [id number] Date of Birth: 10-20-1945

## 2017-06-27 ENCOUNTER — Ambulatory Visit: Payer: PRIVATE HEALTH INSURANCE

## 2017-06-27 DIAGNOSIS — M25511 Pain in right shoulder: Secondary | ICD-10-CM

## 2017-06-27 DIAGNOSIS — M25611 Stiffness of right shoulder, not elsewhere classified: Secondary | ICD-10-CM

## 2017-06-27 DIAGNOSIS — R42 Dizziness and giddiness: Secondary | ICD-10-CM

## 2017-06-27 NOTE — Therapy (Signed)
[facility] [address] [place], [address] Phone: [phone]   Fax:  [phone]  Physical Therapy Treatment  Patient Details  Name: Holly [name] MRN: [id number] Date of Birth: Dec 21, 1945 Referring Provider: Kurtis Bushman, MD   Encounter Date: 06/27/2017  PT End of Session - 06/27/17 1613    Visit Number  15    Number of Visits  29    Date for PT Re-Evaluation  07/05/17    Authorization Type  no g codes, workers compensation    PT Start Time  1115    PT Stop Time  1220    PT Time Calculation (min)  65 min    Equipment Utilized During Treatment  Gait belt    Activity Tolerance  Patient tolerated treatment well    Behavior During Therapy  [facility] for tasks assessed/performed       Past Medical History:  Diagnosis Date  . Anxiety   . Depression   . Guillain Barr syndrome ([place]) 2005  . Osteoporosis   . Squamous cell skin cancer 2011    Past Surgical History:  Procedure Laterality Date  . ABDOMINAL HYSTERECTOMY  1989  . APPENDECTOMY  1960  . BREAST SURGERY  1980   biopsy  . WRIST FRACTURE SURGERY Right 2015    There were no vitals filed for this visit.  Subjective Assessment - 06/27/17 1132    Subjective  Pt states that she is doing well at this time. She has been trying to progress her ROM at home. She reports mild soreness at 2/10 upon arrival today. She states that the electrical stimulation really helped her shoulder last time and she didn't have any pain for the rest of the day.  She states that her dizziness is also improving significantly. She states that she feels like this is 90-95% improved and she notices less dizziness with walking backwards and turning her head.     Pertinent History  Parts of history borrowed by MD. Patient reports on 10/13 she was going up a ramp and lost her footing, fell forward and onto right shoulder. Caused her to fracture nasal bone, proximal humerus. Pt states  she bit through her bottom lip. Reports she had loss of consciousness for a short period. Head CT showed nasal fracture but no IC bleed. RUE is immobilized and being managed conservatively. She complains of "heaviness" feeling in head and eyes. She is still having intermittent headaches. She is complaining of balance problems, dizziness, still feels weak and has trouble focusing. She complains of dizziness with quick head turns. Denies vertigo. No prior history of concussion. She had Guillan Barre in 2005 that causes shakiness in arms since then but otherwise has done well. She reports some mild RLE weakness since the [facility]. Denies any slurring of speech, difficulty, swallowing, or focal numbness/tingling/weakness since the concussion. ROS negative for red flags     Limitations  Reading    Patient Stated Goals  to return to previous level of funcition.     Currently in Pain?  Yes    Pain Score  2     Pain Location  Arm    Pain Orientation  Right;Upper    Pain Descriptors / Indicators  Aching    Pain Type  Acute pain    Pain Onset  More than a month ago          TREATMENT  Ther-ex PROMwith 10sholdsat end range in all planesx multiple  bouts, mild pain at end range in all planes; Supine R shoulder AROM flexion to comfortable end range x 10, added 1# weight x 10 with increase in muscle fatigue/instability but no increase in pain; L sidelying R shoulder abduction to comfortable end range x 10, added 1# weight x 10 with increase in muscle fatigue/instability but no increase in pain, pt actually reports less discomfort; L sidelying R shoulderER with shoulder placed into scaption plane by towelnext to body x 10, added 1# weight x 10 no increase in pain; Seated rows with red tband 2 x 10, no pain  Neuromuscular Re-education  VOR X 1 exercise: Patient performed VOR X 1 horizontal with retro ambulation 3 reps of 35' each,verbal cues for technique. Patient reports increase in  dizziness to only 1/10 duringretro ambulation which is significant improvement from prior session;  Ball pass Retro ambulation horizontal ball pass and head/eye follow 35' x 3;  Retro Ambulation Pt ambulated backwards with eyes closed 35' x 3 with CGA from therapist for safety;  Airex Airex balance with eyes closed and feet together x 30s, multiple bouts; Airex balance with eyes closed marching 30s x multiple bouts;  Estim IFC to R shoulder with cold pack applied x 15 minutes at pt tolerated intensity, pt reports significant improvement in pain following estim.                        PT Education - 06/27/17 1612    Education provided  Yes    Education Details  HEP progression, exercise form/technique    Person(s) Educated  Patient    Methods  Explanation    Comprehension  Verbalized understanding       PT Short Term Goals - 06/27/17 1622      PT SHORT TERM GOAL #1   Title  Pt will be independent with HEP in order to improve R shoulder ROM and strength in order to decrease fall risk and improve function at home and work.    Baseline  HEP given     Time  2    Period  Weeks    Status  On-going    Target Date  06/07/17      PT SHORT TERM GOAL #2   Title  Patient will report a worst pain of 5/10 on VAS in R shoulder   to improve tolerance with ADLs and reduced symptoms with activities.     Baseline  12/11: 7/10, 06/27/17: 6/10    Time  2    Period  Weeks    Status  On-going    Target Date  06/07/17      PT SHORT TERM GOAL #3   Title  Patient will improve R shoulder AROM flexion and abduction to 80 degrees to improve ability to don and doff clothes     Baseline  flexion: 72 degrees, abduction 54 degrees; 06/27/17: 135 flexion R, 160 L, 115 abduction R, 160 L, 68 ER R, 85 L, 60 IR R, 70 L in supine    Time  2    Period  Weeks    Status  Achieved        PT Long Term Goals - 06/27/17 1642      PT LONG TERM GOAL #1   Title  Pt will improve ABC by  at least 13% in order to demonstrate clinically significant improvement in balance confidence.     Baseline  04/15/17: 45%, 06/03/17: 59.7% initial goal achieved and  advanced to an additional 13%    Time  8    Period  Weeks    Status  Deferred    Target Date  07/29/17      PT LONG TERM GOAL #2   Title  Pt will decrease DHI score by at least 18 points in order to demonstrate clinically significant reduction in disability     Baseline  04/15/17: 68/100; 06/03/17: 54/100    Time  8    Period  Weeks    Status  Deferred    Target Date  07/29/17      PT LONG TERM GOAL #3   Title  Pt will improve DGI to 24/24 in order to demonstrate improved ability to perform head turns with ambulation as well as safety with stairs    Baseline  04/15/17: 21/24; 06/03/17: 23/24    Time  8    Period  Weeks    Status  Deferred    Target Date  07/29/17      PT LONG TERM GOAL #4   Title  Patient will report a worst pain of 3/10 on VAS in R shoulder  to improve tolerance with ADLs and reduced symptoms with activities.     Baseline  12/11: 7/10 pain, 06/27/17: 6/10    Time  6    Period  Weeks    Status  On-going    Target Date  07/05/17      PT LONG TERM GOAL #5   Title  Patient will improve shoulder AROM to > 140 degrees of flexion, scaption, and abduction for improved ability to perform overhead activities    Baseline  12/11: flexion 72, abduction 54; 06/27/17: 135 flexion R, 160 L, 115 abduction R, 160 L, 68 ER R, 85 L, 60 IR R, 70 L in supine    Time  6    Period  Weeks    Status  On-going    Target Date  07/05/17      PT LONG TERM GOAL #6   Title  Patient will decrease Quick DASH score by > 8 points (73.8%)  demonstrating reduced self-reported upper extremity disability.    Baseline  12/11: 81.8%    Time  6    Period  Weeks    Status  Deferred    Target Date  07/05/17      PT LONG TERM GOAL #7   Title  Patient will decrease Quick DASH Work score by > 8 points demonstrating reduced self-reported  upper extremity disability.    Baseline   12/11: 67%    Time  6    Period  Weeks    Status  Deferred    Target Date  07/05/17            Plan - 06/27/17 1613    Clinical Impression Statement  Pt demonstrates excellent motivation with therapy today. She has been progressing her ROM with during as well as between sessions. However she still has significant limitations in both ROM and strength. She denies any sharp RUE pains but some mild aching at end range in all directions. R shoulder ROM measures: 135 flexion R, 160 L, 115 abduction R, 160 L, 68 ER R, 85 L, 60 IR R, 70 L. R shoulder pain at rest appears to be decreasing. She continues to demonstrate significant R shoulder hiking with forward flexion and abduction against gravity. She will need continued physical therapy to address deficits in her R shoulder ROM and strength in  order to return to full pain-free function at home and work.     Rehab Potential  Excellent    PT Frequency  2x / week    PT Duration  8 weeks    PT Treatment/Interventions  Aquatic Therapy;ADLs/Self Care Home Management;Canalith Repostioning;Gait training;Stair training;DME Instruction;Functional mobility training;Therapeutic activities;Balance training;Therapeutic exercise;Neuromuscular re-education;Patient/family education;Manual techniques;Vestibular;Energy conservation    PT Next Visit Plan  Seated card sorting to simulate desk activities, progress head turning activities with forward/retro ambulation, eyes closed activities, body rolls eyes open/closed    PT Home Exercise Plan  06/23/17: add supine AROM R shoulder flexion and L sidelying AROM abduction; Update 06/21/17: forward/retro ambulation with VOR x 1 horizontal, semitandem balance with horizontal head/body turns, semitandem/tandem balance with eyes closed, pulley for R shoulder flexion/abduction, canes for R shoulder flexion/ER, side/back slides for R shoulder IR to assist donning/doffing bra; Prior HEP during  therapy: Standing horizontal/vertical smooth pursuit with conflicting background and marching, standing horizontal/vertical saccades with conflicting background and marching, standing balance with feet in staggered stance on carpet with eyes closed (progress to tandem), VOR x 1 horizontal in standing with conflicting backround and while marching, semitandem balance with horizontal head/body turns;    Consulted and Agree with Plan of Care  Patient       Patient will benefit from skilled therapeutic intervention in order to improve the following deficits and impairments:  Decreased activity tolerance, Decreased balance, Dizziness  Visit Diagnosis: Acute pain of right shoulder  Stiffness of right shoulder, not elsewhere classified  Dizziness and giddiness     Problem List Patient Active Problem List   Diagnosis Date Noted  . Closed fracture of upper end of humerus 04/25/2017  . Concussion with loss of consciousness 04/10/2017  . 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 Encounter for screening colonoscopy 03/16/2013  . Routine general medical examination at a health care facility 03/16/2013  . Right leg weakness 01/23/2013  . History of Guillain-Barre syndrome 01/23/2013  . Radial fracture 01/23/2013  . 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 UNSPECIFIED VITAMIN D DEFICIENCY 08/26/2008  . UNSPECIFIED ANEMIA 08/26/2008  . Recurrent depressive disorder, current episode moderate ([place]) 07/29/2008  . Fatigue due to depression 07/29/2008  . WEIGHT LOSS 07/29/2008   Phillips Grout PT, DPT   Scorpio Fortin 06/27/2017, 4:47 PM  [facility] [address], [place], [address] Phone: [phone]   Fax:  [phone]  Name: MIALANI REICKS MRN: [id number] Date of Birth: 29-Jan-1946

## 2017-06-29 ENCOUNTER — Ambulatory Visit: Payer: PRIVATE HEALTH INSURANCE | Admitting: Speech Pathology

## 2017-06-29 ENCOUNTER — Ambulatory Visit: Payer: PRIVATE HEALTH INSURANCE

## 2017-06-29 DIAGNOSIS — R41841 Cognitive communication deficit: Secondary | ICD-10-CM | POA: Diagnosis not present

## 2017-06-29 DIAGNOSIS — M6281 Muscle weakness (generalized): Secondary | ICD-10-CM

## 2017-06-29 DIAGNOSIS — M25511 Pain in right shoulder: Secondary | ICD-10-CM | POA: Diagnosis not present

## 2017-06-29 DIAGNOSIS — M25611 Stiffness of right shoulder, not elsewhere classified: Secondary | ICD-10-CM

## 2017-06-29 NOTE — Therapy (Signed)
[facility] [address] [place], [address] Phone: [phone]   Fax:  [phone]  Physical Therapy Treatment  Patient Details  Name: Holly [name] MRN: [id number] Date of Birth: [birth year] Referring Provider: Kurtis Bushman, MD   Encounter Date: 06/29/2017  PT End of Session - 06/29/17 1321    Visit Number  16    Number of Visits  29    Date for PT Re-Evaluation  07/05/17    Authorization Type  no g codes, workers compensation    PT Start Time  [phone]    PT Stop Time  1050    PT Time Calculation (min)  60 min    Equipment Utilized During Treatment  Gait belt    Activity Tolerance  Patient tolerated treatment well    Behavior During Therapy  [facility] for tasks assessed/performed       Past Medical History:  Diagnosis Date  . Anxiety   . Depression   . Guillain Barr syndrome ([place]) 2005  . Osteoporosis   . Squamous cell skin cancer 2011    Past Surgical History:  Procedure Laterality Date  . ABDOMINAL HYSTERECTOMY  1989  . APPENDECTOMY  1960  . BREAST SURGERY  1980   biopsy  . WRIST FRACTURE SURGERY Right 2015    There were no vitals filed for this visit.  Subjective Assessment - 06/29/17 1101    Subjective  Pt reports that she is doing well on this date. She states that her dizziness continues to improve. She saw her orthopedist after the last session and he is releasing her from therapy and to return to full duty at the end of January. She continues to progress her ROM and strengthening program at home. No specific questions or concerns except for anxiety regarding ending the therapy for her shoulder.     Pertinent History  Parts of history borrowed by MD. Patient reports on 10/13 she was going up a ramp and lost her footing, fell forward and onto right shoulder. Caused her to fracture nasal bone, proximal humerus. Pt states she bit through her bottom lip. Reports she had loss of consciousness for a  short period. Head CT showed nasal fracture but no IC bleed. RUE is immobilized and being managed conservatively. She complains of "heaviness" feeling in head and eyes. She is still having intermittent headaches. She is complaining of balance problems, dizziness, still feels weak and has trouble focusing. She complains of dizziness with quick head turns. Denies vertigo. No prior history of concussion. She had Guillan Barre in 2005 that causes shakiness in arms since then but otherwise has done well. She reports some mild RLE weakness since the [facility]. Denies any slurring of speech, difficulty, swallowing, or focal numbness/tingling/weakness since the concussion. ROS negative for red flags     Limitations  Reading    Patient Stated Goals  to return to previous level of funcition.     Currently in Pain?  Yes    Pain Score  2     Pain Location  Arm    Pain Orientation  Right;Upper    Pain Descriptors / Indicators  Aching    Pain Type  Acute pain    Pain Onset  More than a month ago       TREATMENT  Ther-ex Moist heat pack during history x 5 minutes; Gross ROM assessment by therapist; PROMwith 10sholdsat end range in all planesx multiple bouts, mild pain at end  range in all planes; Supine R shoulder AROM flexion to comfortable end range x 10, added 2# weight x 10 no increase in pain; L sidelying R shoulder abduction to comfortable end range x 5, added 1# weight x 10, 2# weight x 10, no increase in pain Seated R shoulder AROM flexion with mirror feedback and verbal/tactile cues to avoid shoulder hiking; Standing R shoulder AROM abduction in scapular plane with mirror feedback and verbal/tactile cues to avoid shoulder hiking; Seated rows with red tband 2 x 10, no pain; UE ranger for R shoulder flexion x 15; UE ranger CW/CCW large diameter circles at shoulder height x 10 each direction;  Estim IFCto R shoulder with cold pack applied x 14 minutes at pt tolerated intensity, pt  reports significant improvement in pain following estim.                       PT Education - 06/29/17 1103    Education provided  Yes    Education Details  HEP progression, plan of care    Person(s) Educated  Patient    Methods  Explanation    Comprehension  Verbalized understanding       PT Short Term Goals - 06/27/17 1622      PT SHORT TERM GOAL #1   Title  Pt will be independent with HEP in order to improve R shoulder ROM and strength in order to decrease fall risk and improve function at home and work.    Baseline  HEP given     Time  2    Period  Weeks    Status  On-going    Target Date  06/07/17      PT SHORT TERM GOAL #2   Title  Patient will report a worst pain of 5/10 on VAS in R shoulder   to improve tolerance with ADLs and reduced symptoms with activities.     Baseline  12/11: 7/10, 06/27/17: 6/10    Time  2    Period  Weeks    Status  On-going    Target Date  06/07/17      PT SHORT TERM GOAL #3   Title  Patient will improve R shoulder AROM flexion and abduction to 80 degrees to improve ability to don and doff clothes     Baseline  flexion: 72 degrees, abduction 54 degrees; 06/27/17: 135 flexion R, 160 L, 115 abduction R, 160 L, 68 ER R, 85 L, 60 IR R, 70 L in supine    Time  2    Period  Weeks    Status  Achieved        PT Long Term Goals - 06/27/17 1642      PT LONG TERM GOAL #1   Title  Pt will improve ABC by at least 13% in order to demonstrate clinically significant improvement in balance confidence.     Baseline  04/15/17: 45%, 06/03/17: 59.7% initial goal achieved and advanced to an additional 13%    Time  8    Period  Weeks    Status  Deferred    Target Date  07/29/17      PT LONG TERM GOAL #2   Title  Pt will decrease DHI score by at least 18 points in order to demonstrate clinically significant reduction in disability     Baseline  04/15/17: 68/100; 06/03/17: 54/100    Time  8    Period  Weeks    Status  Deferred    Target  Date  07/29/17      PT LONG TERM GOAL #3   Title  Pt will improve DGI to 24/24 in order to demonstrate improved ability to perform head turns with ambulation as well as safety with stairs    Baseline  04/15/17: 21/24; 06/03/17: 23/24    Time  8    Period  Weeks    Status  Deferred    Target Date  07/29/17      PT LONG TERM GOAL #4   Title  Patient will report a worst pain of 3/10 on VAS in R shoulder  to improve tolerance with ADLs and reduced symptoms with activities.     Baseline  12/11: 7/10 pain, 06/27/17: 6/10    Time  6    Period  Weeks    Status  On-going    Target Date  07/05/17      PT LONG TERM GOAL #5   Title  Patient will improve shoulder AROM to > 140 degrees of flexion, scaption, and abduction for improved ability to perform overhead activities    Baseline  12/11: flexion 72, abduction 54; 06/27/17: 135 flexion R, 160 L, 115 abduction R, 160 L, 68 ER R, 85 L, 60 IR R, 70 L in supine    Time  6    Period  Weeks    Status  On-going    Target Date  07/05/17      PT LONG TERM GOAL #6   Title  Patient will decrease Quick DASH score by > 8 points (73.8%)  demonstrating reduced self-reported upper extremity disability.    Baseline  12/11: 81.8%    Time  6    Period  Weeks    Status  Deferred    Target Date  07/05/17      PT LONG TERM GOAL #7   Title  Patient will decrease Quick DASH Work score by > 8 points demonstrating reduced self-reported upper extremity disability.    Baseline   12/11: 67%    Time  6    Period  Weeks    Status  Deferred    Target Date  07/05/17            Plan - 06/29/17 1322    Clinical Impression Statement  Pt continues to make progress with respect to her R shoulder strength and ROM. Deferred vestibular treatment today in order to focus more on her R shoulder. We will resume vestibular exercises at next visit. Pt provided consolidated home program for R shoulder ROM and strengthening. She is able to progress the resistance for R shoulder  flexion/abduction in supine and sidelying respectively. She also demonstrates better control against gravity. with minimal shoulder hiking. Her ROM against gravity remains limited. Pt encouraged to add active R shoulder flexion and abduction to her HEP in front of a mirror to ensure proper motor control. Follow-up as scheduled.     Rehab Potential  Excellent    PT Frequency  2x / week    PT Duration  8 weeks    PT Treatment/Interventions  Aquatic Therapy;ADLs/Self Care Home Management;Canalith Repostioning;Gait training;Stair training;DME Instruction;Functional mobility training;Therapeutic activities;Balance training;Therapeutic exercise;Neuromuscular re-education;Patient/family education;Manual techniques;Vestibular;Energy conservation    PT Next Visit Plan  More vestibular focus next session: Seated card sorting to simulate desk activities, progress head turning activities with forward/retro ambulation, eyes closed activities, body rolls eyes open/closed; Shoulder: progress strengthening and ROM as tolerated    PT Home Exercise  Plan  06/29/17: RUE: prayer table stretch, sidelying ER with weight, sidelying IR sleeper stretch, AROM flexion/abduction with mirror feedback, theraband rows in standing; Update vestibular 06/21/17: forward/retro ambulation with VOR x 1 horizontal, semitandem balance with horizontal head/body turns, semitandem/tandem balance with eyes closed,  Prior HEP during therapy: Standing horizontal/vertical smooth pursuit with conflicting background and marching, standing horizontal/vertical saccades with conflicting background and marching, standing balance with feet in staggered stance on carpet with eyes closed (progress to tandem), VOR x 1 horizontal in standing with conflicting backround and while marching, semitandem balance with horizontal head/body turns;    Consulted and Agree with Plan of Care  Patient       Patient will benefit from skilled therapeutic intervention in order to  improve the following deficits and impairments:  Decreased activity tolerance, Decreased balance, Dizziness  Visit Diagnosis: Acute pain of right shoulder  Stiffness of right shoulder, not elsewhere classified  Muscle weakness (generalized)     Problem List Patient Active Problem List   Diagnosis Date Noted  . Closed fracture of upper end of humerus 04/25/2017  . Concussion with loss of consciousness 04/10/2017  . 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 Encounter for screening colonoscopy 03/16/2013  . Routine general medical examination at a health care facility 03/16/2013  . Right leg weakness 01/23/2013  . History of Guillain-Barre syndrome 01/23/2013  . Radial fracture 01/23/2013  . 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 UNSPECIFIED VITAMIN D DEFICIENCY 08/26/2008  . UNSPECIFIED ANEMIA 08/26/2008  . Recurrent depressive disorder, current episode moderate ([place]) 07/29/2008  . Fatigue due to depression 07/29/2008  . WEIGHT LOSS 07/29/2008   Phillips Grout PT, DPT   Leeann Bady 06/29/2017, 1:33 PM  [facility] [address], [place], [address] Phone: [phone]   Fax:  [phone]  Name: Holly [name] MRN: [id number] Date of Birth: 08-15-1945

## 2017-06-30 ENCOUNTER — Encounter: Payer: Self-pay | Admitting: Speech Pathology

## 2017-06-30 NOTE — Therapy (Signed)
[place] MAIN [facility] SERVICES [address] [place], [place], [address] Phone: [phone]   Fax:  [phone]  Speech Language Pathology Treatment  Patient Details  Name: Holly [name] MRN: [id number] Date of Birth: August 28, 1945 Referring Provider: Dene Gentry    Encounter Date: 06/29/2017  End of Session - 06/30/17 1344    Visit Number  13    Number of Visits  17    Date for SLP Re-Evaluation  07/18/17    SLP Start Time  1500    SLP Stop Time   1555    SLP Time Calculation (min)  55 min    Activity Tolerance  Patient tolerated treatment well       Past Medical History:  Diagnosis Date  . Anxiety   . Depression   . Guillain Barr syndrome ([place]) 2005  . Osteoporosis   . Squamous cell skin cancer 2011    Past Surgical History:  Procedure Laterality Date  . ABDOMINAL HYSTERECTOMY  1989  . APPENDECTOMY  1960  . BREAST SURGERY  1980   biopsy  . WRIST FRACTURE SURGERY Right 2015    There were no vitals filed for this visit.  Subjective Assessment - 06/30/17 1343    Subjective  Patient agrees that "talking it outloud" strategy helps her retain and process information better            ADULT SLP TREATMENT - 06/30/17 0001      General Information   Behavior/Cognition  Alert;Cooperative;Pleasant mood    HPI  Per MD: Patient is a 72 y.o. female here for concussion.  Patient reports on 10/13 she was going up a ramp and lost her footing, fell forward and onto right shoulder.  Caused her to fracture nasal bone, proximal humerus.  Reports she had loss of consciousness for a short period.  Had headache for 4-5 days with ~5/10 level of pain.  Balance problems, dizziness, still feels weak and has trouble focusing.  No nausea except from the percocet she took for fractures.  No prior history of concussion.  Feels things do not look 'quite straight' but denies wavy lines, loss of vision.  No weakness, numbness in extremities.  She had  Guillan Barre in 2005 that causes shakiness in arms since then but otherwise has done well.       Treatment Provided   Treatment provided  Cognitive-Linquistic      Pain Assessment   Pain Assessment  No/denies pain      Cognitive-Linquistic Treatment   Treatment focused on  Cognition;Patient/family/caregiver education    Skilled Treatment  COGNTION: Completed 2 simple Perplexor puzzles with min cue x1 to incorporate a new reasoning strategy and no cues to recall "rules" (strategies to complete these puzzles).  Patient able to verbalize information provided in clue and what it means in regard to puzzle solving grid.  Verbalize conclusions drawn from word problems with no cues to clarify statements and accurately read the stimulus.  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  Patient demonstrates good comprehension of strategies and wants to continue to explore this avenue.  MEMORY: Patient given first 4 lessons of Brainwave memory program to complete and evaluate.      Assessment / Recommendations / Plan   Plan  Continue with current plan of care      Progression Toward Goals   Progression toward goals  Progressing toward goals  SLP Education - 06/30/17 1344    Education provided  Yes    Education Details  use language skills to fully express thoughts and reasoning    Person(s) Educated  Patient    Methods  Explanation    Comprehension  Verbalized understanding         SLP Long Term Goals - 06/01/17 1243      SLP LONG TERM GOAL #1   Title  Patient will identify cognitive-communication barriers and participate in developing functional compensatory strategies.    Time  4    Period  Weeks    Status  Partially Met    Target Date  07/01/17      SLP LONG TERM GOAL #2   Title  Patient will complete complex attention, executive function, memory, and visual-spatial activities with 80%  accuracy.    Time  4    Period  Weeks    Status  Partially Met    Target Date  07/01/17      SLP LONG TERM GOAL #3   Title  Patient will demonstrate functional cognitive-communication skills for successful return to work.    Time  4    Period  Weeks    Status  Partially Met    Target Date  07/01/17      SLP LONG TERM GOAL #4   Title  Patient will generate grammatical, fluent, and cogent sentence to complete abstract/complex linguistic task with 80% accuracy.    Time  4    Period  Weeks    Status  Partially Met    Target Date  07/01/17       Plan - 06/30/17 1345    Clinical Impression Statement  The patient is able to complete simple cognitive tasks accurately at a slow pace.  The patient demonstrates adequate memory to complete more complex cognitive tasks.  However, she demonstrates increased anxiety as the complexity increases.  The patient demonstrates improved ability to verbalize reasoning during therapeutic tasks with overall improved performance and independence for these tasks.    Speech Therapy Frequency  2x / week    Duration  4 weeks    Treatment/Interventions  Cognitive reorganization;Internal/external aids;Compensatory strategies;SLP instruction and feedback;Patient/family education    Potential to Achieve Goals  Good    Potential Considerations  Ability to learn/carryover information;Co-morbidities;Cooperation/participation level;Medical prognosis;Previous level of function;Severity of impairments;Family/community support    SLP Home Exercise Plan  Memory lessons    Consulted and Agree with Plan of Care  Patient       Patient will benefit from skilled therapeutic intervention in order to improve the following deficits and impairments:   Cognitive communication deficit    Problem List Patient Active Problem List   Diagnosis Date Noted  . Closed fracture of upper end of humerus 04/25/2017  . Concussion with loss of consciousness 04/10/2017  . Intention tremor  08/31/2015  . Encounter for screening colonoscopy 03/16/2013  . Routine general medical examination at a health care facility 03/16/2013  . Right leg weakness 01/23/2013  . History of Guillain-Barre syndrome 01/23/2013  . Radial fracture 01/23/2013  . 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 UNSPECIFIED VITAMIN D DEFICIENCY 08/26/2008  . UNSPECIFIED ANEMIA 08/26/2008  . Recurrent depressive disorder, current episode moderate ([place]) 07/29/2008  . Fatigue due to depression 07/29/2008  . WEIGHT LOSS 07/29/2008   Leroy Sea, MS/CCC- SLP  Lou Miner 06/30/2017, 1:46 PM  [place] MAIN [facility] SERVICES [address] [place], [place], [address]  Phone: [phone]   Fax:  [phone]   Name: Holly [name] MRN: [id number] Date of Birth: March 28, 1946

## 2017-07-04 ENCOUNTER — Ambulatory Visit: Payer: PRIVATE HEALTH INSURANCE

## 2017-07-04 ENCOUNTER — Ambulatory Visit: Payer: PRIVATE HEALTH INSURANCE | Admitting: Speech Pathology

## 2017-07-04 DIAGNOSIS — M25611 Stiffness of right shoulder, not elsewhere classified: Secondary | ICD-10-CM

## 2017-07-04 DIAGNOSIS — M25511 Pain in right shoulder: Secondary | ICD-10-CM

## 2017-07-04 DIAGNOSIS — M6281 Muscle weakness (generalized): Secondary | ICD-10-CM

## 2017-07-04 DIAGNOSIS — R2681 Unsteadiness on feet: Secondary | ICD-10-CM

## 2017-07-04 DIAGNOSIS — R42 Dizziness and giddiness: Secondary | ICD-10-CM

## 2017-07-04 DIAGNOSIS — R41841 Cognitive communication deficit: Secondary | ICD-10-CM | POA: Diagnosis not present

## 2017-07-04 NOTE — Therapy (Signed)
[place] MAIN [facility] SERVICES [address] [place], [place], [address] Phone: [phone]   Fax:  [phone]  Physical Therapy Treatment  Patient Details  Name: Holly [name] MRN: [id number] Date of Birth: May 05, 1946 Referring Provider: Kurtis Bushman, MD   Encounter Date: 07/04/2017  PT End of Session - 07/04/17 1520    Visit Number  17    Number of Visits  37    Date for PT Re-Evaluation  08/01/17    Authorization Type  no g codes, workers compensation, vestibular cert ends 9/32/67    PT Start Time  1407    PT Stop Time  1503    PT Time Calculation (min)  56 min    Equipment Utilized During Treatment  Gait belt    Activity Tolerance  Patient tolerated treatment well    Behavior During Therapy  [facility] for tasks assessed/performed       Past Medical History:  Diagnosis Date  . Anxiety   . Depression   . Guillain Barr syndrome ([place]) 2005  . Osteoporosis   . Squamous cell skin cancer 2011    Past Surgical History:  Procedure Laterality Date  . ABDOMINAL HYSTERECTOMY  1989  . APPENDECTOMY  1960  . BREAST SURGERY  1980   biopsy  . WRIST FRACTURE SURGERY Right 2015    There were no vitals filed for this visit.  Subjective Assessment - 07/04/17 1512    Subjective  Pt reports that she is doing well on this date. Her dizziness has improved dramatically and she states that most of the time she forgets about it entirely. She has been having slightly more R shoulder pain recently with the colder weather. She continues with her HEP and is eager to continue pushing her ROM. Her current ROM and strength is not sufficient for her to be able to complete functional activities such as fastening her bra or getting dishes from overhead shelves.    Pertinent History  Parts of history borrowed by MD. Patient reports on 10/13 she was going up a ramp and lost her footing, fell forward and onto right shoulder. Caused her to fracture nasal bone, proximal  humerus. Pt states she bit through her bottom lip. Reports she had loss of consciousness for a short period. Head CT showed nasal fracture but no IC bleed. RUE is immobilized and being managed conservatively. She complains of "heaviness" feeling in head and eyes. She is still having intermittent headaches. She is complaining of balance problems, dizziness, still feels weak and has trouble focusing. She complains of dizziness with quick head turns. Denies vertigo. No prior history of concussion. She had Guillan Barre in 2005 that causes shakiness in arms since then but otherwise has done well. She reports some mild RLE weakness since the [facility]. Denies any slurring of speech, difficulty, swallowing, or focal numbness/tingling/weakness since the concussion. ROS negative for red flags     Limitations  Reading    Diagnostic tests  plain films    Patient Stated Goals  to return to previous level of funcition.     Currently in Pain?  Yes    Pain Score  4     Pain Location  Shoulder    Pain Orientation  Right;Upper    Pain Descriptors / Indicators  Aching    Pain Type  Acute pain    Pain Onset  More than a month ago    Pain Frequency  Intermittent  [facility] PT Assessment - 07/04/17 1447      Observation/Other Assessments   Other Surveys   Other Surveys    Activities of Balance Confidence Scale (ABC Scale)   86.57%    Dizziness Handicap Inventory (DHI)   12/100    Quick DASH   36.36%         TREATMENT  Ther-ex Pt completed quick DASH, ABC, and DHI (unbilled); Scored and discussed results of surveys; Goals updated with patient Supine R shoulder flexion 2# weight 2 x 10 no increase in pain; L sidelying R shoulder abduction 2# weight 2 x 10, no increase in pain L sidelying R shoulder ER 2# weight 2 x 10, no increase in pain; Supine R shoulder serratus punch with manual resistance 2 x 10; Supine R shoulder rhythmic stabilization at 90 flexion x 30s; R shoulder strength  testing: 4-/5 for flexion, abduction, ER, and IR  Manual Therapy  Moist heat pack during history x 5 minutes; PROMwith10sholdsat end range in all planesxmultiple bouts, mild pain at end rangein all planes; R shoulder AP mobs at neutral, grade II, 30s/bout x 3 bouts; R shoulder posterior/inferior glides at end range flexion, grade II, 30s/bout x 2 bouts; R shoulder inferior mobs at 90 scaption, grade II, 30s/bout x 3 bouts; AAROM measures obtained 142 flexion R, 160 L, 120 abduction R, 160 L, 72 ER R, 85 L, 63 IR R, 70 L in supine;  Neuromuscular Re-education  Pt completed DGI and scored 24/24; Card sorting sitting at table with head/eye follow placing cards into stacks based on suits, no dizziness, Forward/retro ambulation with horizontal ball toss x multiple bouts;                 PT Education - 07/04/17 1518    Education provided  Yes    Education Details  HEP reinforced, exercise form/technique, plan of care and goals    Person(s) Educated  Patient    Methods  Explanation    Comprehension  Verbalized understanding       PT Short Term Goals - 07/04/17 1433      PT SHORT TERM GOAL #1   Title  Pt will be independent with HEP in order to improve R shoulder ROM and strength in order to decrease fall risk and improve function at home and work.    Baseline  HEP given     Time  2    Period  Weeks    Status  On-going    Target Date  07/18/17      PT SHORT TERM GOAL #2   Title  Patient will report a worst pain of 5/10 on VAS in R shoulder   to improve tolerance with ADLs and reduced symptoms with activities.     Baseline  12/11: 7/10, 06/27/17: 6/10, 07/04/17: 6-7/10 worst with IR stretch    Time  2    Period  Weeks    Status  On-going    Target Date  07/18/17      PT SHORT TERM GOAL #3   Title  Patient will improve R shoulder AROM flexion and abduction to 80 degrees to improve ability to don and doff clothes     Baseline  flexion: 72 degrees, abduction 54  degrees; 06/27/17: 135 flexion R, 160 L, 115 abduction R, 160 L, 68 ER R, 85 L, 60 IR R, 70 L in supine; 07/04/17: 142 flexion R, 160 L, 120 abduction R, 160 L, 72 ER R, 85 L, 63 IR  R, 70 L in supine    Time  2    Period  Weeks    Status  Achieved    Target Date  06/07/17        PT Long Term Goals - 07/04/17 1440      PT LONG TERM GOAL #1   Title  Pt will improve ABC by at least 13% in order to demonstrate clinically significant improvement in balance confidence.     Baseline  04/15/17: 45%, 06/03/17: 59.7% initial goal achieved and advanced to an additional 13%, 07/04/17: 86.57%    Time  8    Period  Weeks    Status  Achieved    Target Date  07/29/17      PT LONG TERM GOAL #2   Title  Pt will decrease DHI score by at least 18 points in order to demonstrate clinically significant reduction in disability     Baseline  04/15/17: 68/100; 06/03/17: 54/100; 07/04/17: 12/100    Time  8    Period  Weeks    Status  Achieved    Target Date  07/29/17      PT LONG TERM GOAL #3   Title  Pt will improve DGI to 24/24 in order to demonstrate improved ability to perform head turns with ambulation as well as safety with stairs    Baseline  04/15/17: 21/24; 06/03/17: 23/24; 07/04/17: 24/24    Time  8    Period  Weeks    Status  Deferred    Target Date  07/29/17      PT LONG TERM GOAL #4   Title  Patient will report a worst pain of 3/10 on VAS in R shoulder  to improve tolerance with ADLs and reduced symptoms with activities.     Baseline  12/11: 7/10 pain, 06/27/17: 6/10, 07/04/17: 6/10 with IR stretch    Time  6    Period  Weeks    Status  On-going    Target Date  08/01/17      PT LONG TERM GOAL #5   Title  Patient will improve shoulder AROM to > 140 degrees of flexion, scaption, and abduction for improved ability to perform overhead activities    Baseline  12/11: flexion 72, abduction 54; 06/27/17: 135 flexion R, 160 L, 115 abduction R, 160 L, 68 ER R, 85 L, 60 IR R, 70 L in supine; 07/04/17: 142  flexion R, 160 L, 120 abduction R, 160 L, 72 ER R, 85 L, 63 IR R, 70 L in supine    Time  6    Period  Weeks    Status  On-going    Target Date  08/01/17      Additional Long Term Goals   Additional Long Term Goals  Yes      PT LONG TERM GOAL #6   Title  Patient will decrease Quick DASH score by > 8 points (73.8%)  demonstrating reduced self-reported upper extremity disability.    Baseline  12/11: 81.8%; 07/04/17: 36.36%    Time  6    Period  Weeks    Status  Achieved    Target Date  07/05/17      PT LONG TERM GOAL #7   Title  Patient will decrease Quick DASH Work score by > 8 points demonstrating reduced self-reported upper extremity disability.    Baseline   12/11: 67%; 07/04/17: 36.36%    Time  6    Period  Weeks  Status  Achieved    Target Date  07/05/17      PT LONG TERM GOAL #8   Title  Patient will decrease Quick DASH score by > 16 points (below 20%)  demonstrating reduced self-reported upper extremity disability.    Baseline   12/11: 67%; 07/04/17: 36.36%,     Time  4    Period  Weeks    Status  New    Target Date  08/01/17      PT LONG TERM GOAL  #9   TITLE  Pt will be able to take dishes out of her overhead cupboard and fasten her bra in the back in order to return to full funciton at home    Baseline  07/04/17: unable    Time  4    Period  Weeks    Status  New    Target Date  08/01/17      PT LONG TERM GOAL  #10   TITLE  Pt will improve R shoulder strength by to at least 4/5 for flexion, abduction, IR, and ER in order to improve function at home and work    Baseline  07/04/17: 4-/5 for flexion, abduction, ER, and IR    Time  4    Period  Weeks    Status  New    Target Date  08/01/17            Plan - 07/04/17 1431    Clinical Impression Statement  Pt continue to makes progress with her R shoulder strength and ROM however it remains limited. She reports persistent functional deficits with overhead reaching and dressing. Her quick DASH score is 36.36%  indicating persistent disability related to her R shoulder injury. AAROM measures obtained in supine today for R shoulder are 142 flexion, 120 abduction, 72 ER, and 63 IR in supine which are improving but still a deviation from her unaffected shoulder. She also continues to report pain that increases up to 6/10 on NPRS with internal rotation stretching. Pt would benefit from continued therapy for her R shoulder to continue progression of her ROM and strength in order to improve pain-free function at home and in order to return to work.      Clinical Decision Making  Moderate    Rehab Potential  Excellent    Clinical Impairments Affecting Rehab Potential  History of GB syndrome, comordities of fall, (+) continued progression of symptoms, acuity of problem.     PT Frequency  2x / week    PT Duration  4 weeks    PT Treatment/Interventions  Aquatic Therapy;ADLs/Self Care Home Management;Canalith Repostioning;Gait training;Stair training;DME Instruction;Functional mobility training;Therapeutic activities;Balance training;Therapeutic exercise;Neuromuscular re-education;Patient/family education;Manual techniques;Vestibular;Energy conservation    PT Next Visit Plan  Sensory organization test on neurocom, progress head turning activities with forward/retro ambulation in medical mall, eyes closed activities, body rolls eyes open/closed; Shoulder: progress strengthening and ROM as tolerated    PT Home Exercise Plan  06/29/17: RUE: prayer table stretch, sidelying ER with weight, sidelying IR sleeper stretch, AROM flexion/abduction with mirror feedback, theraband rows in standing; Update vestibular 06/21/17: forward/retro ambulation with VOR x 1 horizontal, semitandem balance with horizontal head/body turns, semitandem/tandem balance with eyes closed,  Prior HEP during therapy: Standing horizontal/vertical smooth pursuit with conflicting background and marching, standing horizontal/vertical saccades with conflicting  background and marching, standing balance with feet in staggered stance on carpet with eyes closed (progress to tandem), VOR x 1 horizontal in standing with conflicting backround and while marching,  semitandem balance with horizontal head/body turns;    Consulted and Agree with Plan of Care  Patient       Patient will benefit from skilled therapeutic intervention in order to improve the following deficits and impairments:  Decreased activity tolerance, Decreased balance, Dizziness  Visit Diagnosis: Acute pain of right shoulder - Plan: PT plan of care cert/re-cert  Stiffness of right shoulder, not elsewhere classified - Plan: PT plan of care cert/re-cert  Muscle weakness (generalized) - Plan: PT plan of care cert/re-cert  Dizziness and giddiness  Unsteadiness on feet     Problem List Patient Active Problem List   Diagnosis Date Noted  . Closed fracture of upper end of humerus 04/25/2017  . Concussion with loss of consciousness 04/10/2017  . 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 Encounter for screening colonoscopy 03/16/2013  . Routine general medical examination at a health care facility 03/16/2013  . Right leg weakness 01/23/2013  . History of Guillain-Barre syndrome 01/23/2013  . Radial fracture 01/23/2013  . 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 UNSPECIFIED VITAMIN D DEFICIENCY 08/26/2008  . UNSPECIFIED ANEMIA 08/26/2008  . Recurrent depressive disorder, current episode moderate ([place]) 07/29/2008  . Fatigue due to depression 07/29/2008  . WEIGHT LOSS 07/29/2008   Phillips Grout PT, DPT   Huprich,Jason 07/04/2017, 3:53 PM  [place] MAIN [facility] SERVICES [address] [place], [place], [address] Phone: [phone]   Fax:  [phone]  Name: Holly [name] MRN: [id number] Date of Birth: 17-Nov-1945

## 2017-07-05 ENCOUNTER — Ambulatory Visit: Payer: PRIVATE HEALTH INSURANCE

## 2017-07-05 NOTE — Therapy (Signed)
[place] MAIN [facility] SERVICES [address] [place], [place], [address] Phone: [phone]   Fax:  [phone]  Speech Language Pathology Treatment  Patient Details  Name: Holly [name] MRN: [id number] Date of Birth: 10-06-1945 Referring Provider: Dene Gentry    Encounter Date: 07/04/2017  End of Session - 07/05/17 0814    Visit Number  14    Number of Visits  17    Date for SLP Re-Evaluation  07/18/17    SLP Start Time  1500    SLP Stop Time   1600    SLP Time Calculation (min)  60 min    Activity Tolerance  Patient tolerated treatment well       Past Medical History:  Diagnosis Date  . Anxiety   . Depression   . Guillain Barr syndrome ([place]) 2005  . Osteoporosis   . Squamous cell skin cancer 2011    Past Surgical History:  Procedure Laterality Date  . ABDOMINAL HYSTERECTOMY  1989  . APPENDECTOMY  1960  . BREAST SURGERY  1980   biopsy  . WRIST FRACTURE SURGERY Right 2015    There were no vitals filed for this visit.  Subjective Assessment - 07/05/17 0814    Subjective  Patient reports she is not getting dizzy and feels that she is thinking more clearly            ADULT SLP TREATMENT - 07/05/17 0001      General Information   Behavior/Cognition  Alert;Cooperative;Pleasant mood    HPI  Per MD: Patient is a 72 y.o. female here for concussion.  Patient reports on 10/13 she was going up a ramp and lost her footing, fell forward and onto right shoulder.  Caused her to fracture nasal bone, proximal humerus.  Reports she had loss of consciousness for a short period.  Had headache for 4-5 days with ~5/10 level of pain.  Balance problems, dizziness, still feels weak and has trouble focusing.  No nausea except from the percocet she took for fractures.  No prior history of concussion.  Feels things do not look 'quite straight' but denies wavy lines, loss of vision.  No weakness, numbness in extremities.  She had Guillan Barre  in 2005 that causes shakiness in arms since then but otherwise has done well.       Treatment Provided   Treatment provided  Cognitive-Linquistic      Pain Assessment   Pain Assessment  No/denies pain      Cognitive-Linquistic Treatment   Treatment focused on  Cognition;Patient/family/caregiver education    Skilled Treatment  COGNTION: Completed 2 simple Perplexor puzzles with mod cues understand new reasoning concept and min cues to recall "rules" (strategies to complete these puzzles).  Patient able to verbalize information provided in clue and what it means in regard to puzzle solving grid.  MEMORY: Review first 4 lessons of Brainwave memory program patient completed since the last session, given the next 3 lessons. Patient required 3 repetitions to repeat 5-word list.  Recall in 5 second, 15 second, 5 minute, 10 minute intervals with 90% accuracy.      Assessment / Recommendations / Plan   Plan  Continue with current plan of care      Progression Toward Goals   Progression toward goals  Progressing toward goals       SLP Education - 07/05/17 0814    Education provided  Yes    Education Details  Sel-organization, memory strategies  Person(s) Educated  Patient    Methods  Explanation    Comprehension  Verbalized understanding         SLP Long Term Goals - 06/01/17 1243      SLP LONG TERM GOAL #1   Title  Patient will identify cognitive-communication barriers and participate in developing functional compensatory strategies.    Time  4    Period  Weeks    Status  Partially Met    Target Date  07/01/17      SLP LONG TERM GOAL #2   Title  Patient will complete complex attention, executive function, memory, and visual-spatial activities with 80% accuracy.    Time  4    Period  Weeks    Status  Partially Met    Target Date  07/01/17      SLP LONG TERM GOAL #3   Title  Patient will demonstrate functional cognitive-communication skills for successful return to work.     Time  4    Period  Weeks    Status  Partially Met    Target Date  07/01/17      SLP LONG TERM GOAL #4   Title  Patient will generate grammatical, fluent, and cogent sentence to complete abstract/complex linguistic task with 80% accuracy.    Time  4    Period  Weeks    Status  Partially Met    Target Date  07/01/17       Plan - 07/05/17 0815    Clinical Impression Statement  The patient is able to complete simple cognitive tasks accurately at a slow pace.  The patient demonstrates adequate memory to complete more complex cognitive tasks.  However, she demonstrates increased anxiety as the complexity increases.  The patient demonstrates improved ability to verbalize reasoning during therapeutic tasks with overall improved performance and independence for these tasks.    Speech Therapy Frequency  2x / week    Duration  4 weeks    Treatment/Interventions  Cognitive reorganization;Internal/external aids;Compensatory strategies;SLP instruction and feedback;Patient/family education    Potential to Achieve Goals  Good    Potential Considerations  Ability to learn/carryover information;Co-morbidities;Cooperation/participation level;Medical prognosis;Previous level of function;Severity of impairments;Family/community support    SLP Home Exercise Plan  Memory lessons    Consulted and Agree with Plan of Care  Patient       Patient will benefit from skilled therapeutic intervention in order to improve the following deficits and impairments:   Cognitive communication deficit    Problem List Patient Active Problem List   Diagnosis Date Noted  . Closed fracture of upper end of humerus 04/25/2017  . Concussion with loss of consciousness 04/10/2017  . 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 Encounter for screening colonoscopy 03/16/2013  . Routine general medical examination at a health care facility 03/16/2013  . Right leg weakness 01/23/2013  . History of Guillain-Barre syndrome 01/23/2013  . Radial  fracture 01/23/2013  . 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 UNSPECIFIED VITAMIN D DEFICIENCY 08/26/2008  . UNSPECIFIED ANEMIA 08/26/2008  . Recurrent depressive disorder, current episode moderate ([place]) 07/29/2008  . Fatigue due to depression 07/29/2008  . WEIGHT LOSS 07/29/2008   Leroy Sea, MS/CCC- SLP  Lou Miner 07/05/2017, 8:16 AM  [place] MAIN [facility] SERVICES [address] [place], [place], [address] Phone: [phone]   Fax:  [phone]   Name: Holly [name] MRN: [id number] Date of Birth: 07-20-1945

## 2017-07-06 ENCOUNTER — Encounter: Payer: Self-pay | Admitting: Speech Pathology

## 2017-07-06 ENCOUNTER — Ambulatory Visit: Payer: PRIVATE HEALTH INSURANCE | Admitting: Speech Pathology

## 2017-07-06 DIAGNOSIS — R41841 Cognitive communication deficit: Secondary | ICD-10-CM | POA: Diagnosis not present

## 2017-07-06 NOTE — Therapy (Signed)
[place] MAIN [facility] SERVICES [address] [place], [place], [address] Phone: [phone]   Fax:  [phone]  Speech Language Pathology Treatment  Patient Details  Name: Holly [name] MRN: [id number] Date of Birth: 11/22/1945 Referring Provider: Dene Gentry    Encounter Date: 07/06/2017  End of Session - 07/06/17 1657    Visit Number  15    Number of Visits  17    Date for SLP Re-Evaluation  07/18/17    SLP Start Time  1500    SLP Stop Time   1600    SLP Time Calculation (min)  60 min    Activity Tolerance  Patient tolerated treatment well       Past Medical History:  Diagnosis Date  . Anxiety   . Depression   . Guillain Barr syndrome ([place]) 2005  . Osteoporosis   . Squamous cell skin cancer 2011    Past Surgical History:  Procedure Laterality Date  . ABDOMINAL HYSTERECTOMY  1989  . APPENDECTOMY  1960  . BREAST SURGERY  1980   biopsy  . WRIST FRACTURE SURGERY Right 2015    There were no vitals filed for this visit.  Subjective Assessment - 07/06/17 1655    Subjective  Patient able to recall and explain memory strategies            ADULT SLP TREATMENT - 07/06/17 0001      General Information   Behavior/Cognition  Alert;Cooperative;Pleasant mood    HPI  Per MD: Patient is a 72 y.o. female here for concussion.  Patient reports on 10/13 she was going up a ramp and lost her footing, fell forward and onto right shoulder.  Caused her to fracture nasal bone, proximal humerus.  Reports she had loss of consciousness for a short period.  Had headache for 4-5 days with ~5/10 level of pain.  Balance problems, dizziness, still feels weak and has trouble focusing.  No nausea except from the percocet she took for fractures.  No prior history of concussion.  Feels things do not look 'quite straight' but denies wavy lines, loss of vision.  No weakness, numbness in extremities.  She had Guillan Barre in 2005 that causes shakiness  in arms since then but otherwise has done well.       Treatment Provided   Treatment provided  Cognitive-Linquistic      Pain Assessment   Pain Assessment  No/denies pain      Cognitive-Linquistic Treatment   Treatment focused on  Cognition;Patient/family/caregiver education    Skilled Treatment  COGNTION: Completed 2 simple Perplexor puzzles with mod cues understand new reasoning concept and min cues to recall "rules" (strategies to complete these puzzles).  Patient able to verbalize information provided in clue and what it means in regard to puzzle solving grid with cues.  MEMORY: Review 3 lessons of Brainwave memory program patient completed since the last session, given the next 4 lessons. Patient able to recall and explain concepts/strategies reviewed in the lessons.  Patient required 4 repetitions to repeat 5-word list.  Recall in 5 second, 15 second, 5 minute, 10 minute, and 25 minute intervals with 90% accuracy.      Assessment / Recommendations / Plan   Plan  Continue with current plan of care      Progression Toward Goals   Progression toward goals  Progressing toward goals       SLP Education - 07/06/17 1656    Education provided  Yes  Education Details  recalling and manipulating separate pieces of information    Person(s) Educated  Patient    Methods  Explanation    Comprehension  Verbalized understanding         SLP Long Term Goals - 06/01/17 1243      SLP LONG TERM GOAL #1   Title  Patient will identify cognitive-communication barriers and participate in developing functional compensatory strategies.    Time  4    Period  Weeks    Status  Partially Met    Target Date  07/01/17      SLP LONG TERM GOAL #2   Title  Patient will complete complex attention, executive function, memory, and visual-spatial activities with 80% accuracy.    Time  4    Period  Weeks    Status  Partially Met    Target Date  07/01/17      SLP LONG TERM GOAL #3   Title  Patient will  demonstrate functional cognitive-communication skills for successful return to work.    Time  4    Period  Weeks    Status  Partially Met    Target Date  07/01/17      SLP LONG TERM GOAL #4   Title  Patient will generate grammatical, fluent, and cogent sentence to complete abstract/complex linguistic task with 80% accuracy.    Time  4    Period  Weeks    Status  Partially Met    Target Date  07/01/17       Plan - 07/06/17 1657    Clinical Impression Statement  The patient is able to complete simple cognitive tasks accurately at a slow pace.  The patient demonstrates adequate memory to complete more complex cognitive tasks.  However, she demonstrates increased anxiety as the complexity increases.  The patient demonstrates improved ability to verbalize reasoning during therapeutic tasks with overall improved performance and independence for these tasks.    Speech Therapy Frequency  2x / week    Duration  4 weeks    Treatment/Interventions  Cognitive reorganization;Internal/external aids;Compensatory strategies;SLP instruction and feedback;Patient/family education    Potential to Achieve Goals  Good    Potential Considerations  Ability to learn/carryover information;Co-morbidities;Cooperation/participation level;Medical prognosis;Previous level of function;Severity of impairments;Family/community support    SLP Home Exercise Plan  Memory lessons    Consulted and Agree with Plan of Care  Patient       Patient will benefit from skilled therapeutic intervention in order to improve the following deficits and impairments:   Cognitive communication deficit    Problem List Patient Active Problem List   Diagnosis Date Noted  . Closed fracture of upper end of humerus 04/25/2017  . Concussion with loss of consciousness 04/10/2017  . 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 Encounter for screening colonoscopy 03/16/2013  . Routine general medical examination at a health care facility 03/16/2013  .  Right leg weakness 01/23/2013  . History of Guillain-Barre syndrome 01/23/2013  . Radial fracture 01/23/2013  . 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 UNSPECIFIED VITAMIN D DEFICIENCY 08/26/2008  . UNSPECIFIED ANEMIA 08/26/2008  . Recurrent depressive disorder, current episode moderate ([place]) 07/29/2008  . Fatigue due to depression 07/29/2008  . WEIGHT LOSS 07/29/2008   Leroy Sea, MS/CCC- SLP  Lou Miner 07/06/2017, 4:58 PM  [place] MAIN [facility] SERVICES [address] [place], [place], [address] Phone: [phone]   Fax:  [phone]   Name: Holly [name] MRN: [id number] Date of  Birth: 1946/06/11

## 2017-07-07 ENCOUNTER — Ambulatory Visit: Payer: PRIVATE HEALTH INSURANCE

## 2017-07-07 VITALS — BP 128/73 | HR 76

## 2017-07-07 DIAGNOSIS — R41841 Cognitive communication deficit: Secondary | ICD-10-CM | POA: Diagnosis not present

## 2017-07-07 DIAGNOSIS — R42 Dizziness and giddiness: Secondary | ICD-10-CM

## 2017-07-07 DIAGNOSIS — M25611 Stiffness of right shoulder, not elsewhere classified: Secondary | ICD-10-CM

## 2017-07-07 DIAGNOSIS — M25511 Pain in right shoulder: Secondary | ICD-10-CM

## 2017-07-07 NOTE — Therapy (Signed)
[place] MAIN [facility] SERVICES [address] [place], [place], [address] Phone: [phone]   Fax:  [phone]  Physical Therapy Treatment  Patient Details  Name: Holly [name] MRN: [id number] Date of Birth: Jul 19, 1945 Referring Provider: Kurtis Bushman, MD   Encounter Date: 07/07/2017  PT End of Session - 07/07/17 0820    Visit Number  18    Number of Visits  37    Date for PT Re-Evaluation  08/01/17    Authorization Type  no g codes, workers compensation, vestibular cert ends 0/62/69    PT Start Time  0810    PT Stop Time  0910    PT Time Calculation (min)  60 min    Equipment Utilized During Treatment  Gait belt    Activity Tolerance  Patient tolerated treatment well    Behavior During Therapy  [facility] for tasks assessed/performed       Past Medical History:  Diagnosis Date  . Anxiety   . Depression   . Guillain Barr syndrome ([place]) 2005  . Osteoporosis   . Squamous cell skin cancer 2011    Past Surgical History:  Procedure Laterality Date  . ABDOMINAL HYSTERECTOMY  1989  . APPENDECTOMY  1960  . BREAST SURGERY  1980   biopsy  . WRIST FRACTURE SURGERY Right 2015    Vitals:   07/07/17 0816  BP: 128/73  Pulse: 76  SpO2: 100%    Subjective Assessment - 07/07/17 0804    Subjective  Pt reports that she continues to do well today. She is reporting less RUE pain at rest and is progressing her home exercise program. Her dizziness continues to improve. She states that she did go for a 45 minute walk yesterday and afterward she felt mildly dizzy/lightheaded as well as very tired and with a heavy sensation behind her eyes. Otherwise no specific questions or concerns at this time    Pertinent History  Parts of history borrowed by MD. Patient reports on 10/13 she was going up a ramp and lost her footing, fell forward and onto right shoulder. Caused her to fracture nasal bone, proximal humerus. Pt states she bit through her bottom lip.  Reports she had loss of consciousness for a short period. Head CT showed nasal fracture but no IC bleed. RUE is immobilized and being managed conservatively. She complains of "heaviness" feeling in head and eyes. She is still having intermittent headaches. She is complaining of balance problems, dizziness, still feels weak and has trouble focusing. She complains of dizziness with quick head turns. Denies vertigo. No prior history of concussion. She had Guillan Barre in 2005 that causes shakiness in arms since then but otherwise has done well. She reports some mild RLE weakness since the [facility]. Denies any slurring of speech, difficulty, swallowing, or focal numbness/tingling/weakness since the concussion. ROS negative for red flags     Limitations  Reading    Diagnostic tests  plain films    Patient Stated Goals  to return to previous level of funcition.     Currently in Pain?  Yes    Pain Score  1     Pain Location  Shoulder    Pain Orientation  Right;Upper    Pain Descriptors / Indicators  Aching    Pain Type  Acute pain    Pain Onset  More than a month ago    Pain Frequency  Intermittent         TREATMENT  Ther-ex  Moist heat pack during history x 5 minutes; PROMwith10sholdsat end range in all planesxmultiple bouts, mild pain at end rangein all planes; Supine R shoulder flexion2# weight 2 x 10 no increase in pain; L sidelying R shoulder abduction 2# weight x 10, no increase in pain, attempted 3# initially but too difficult for patient; L sidelying R shoulder ER 2# weight x 10, no increase in pain,  Standing bilateral UE arm wall slides with upper trap lift off x 20 (added to HEP); Standing RUE wall circles at shoulder height, 20 clockwise and counterclockwise (added to HEP); Pt issued written HEP with instructions about how to perform at home.   Neuromuscular Re-education  Performed sensory organization test on Geophysicist/field seismologist (see scanned results). Composite  score of 70. Significant improvement since initial assessment. Discussed HEP and encouraged eyes closed balance activities as part of HEP.                     PT Education - 07/07/17 0820    Education provided  Yes    Education Details  Plan of care, HEP progression, practice balance activities with eyes closed on firm/foam    Person(s) Educated  Patient    Methods  Explanation    Comprehension  Verbalized understanding       PT Short Term Goals - 07/04/17 1433      PT SHORT TERM GOAL #1   Title  Pt will be independent with HEP in order to improve R shoulder ROM and strength in order to decrease fall risk and improve function at home and work.    Baseline  HEP given     Time  2    Period  Weeks    Status  On-going    Target Date  07/18/17      PT SHORT TERM GOAL #2   Title  Patient will report a worst pain of 5/10 on VAS in R shoulder   to improve tolerance with ADLs and reduced symptoms with activities.     Baseline  12/11: 7/10, 06/27/17: 6/10, 07/04/17: 6-7/10 worst with IR stretch    Time  2    Period  Weeks    Status  On-going    Target Date  07/18/17      PT SHORT TERM GOAL #3   Title  Patient will improve R shoulder AROM flexion and abduction to 80 degrees to improve ability to don and doff clothes     Baseline  flexion: 72 degrees, abduction 54 degrees; 06/27/17: 135 flexion R, 160 L, 115 abduction R, 160 L, 68 ER R, 85 L, 60 IR R, 70 L in supine; 07/04/17: 142 flexion R, 160 L, 120 abduction R, 160 L, 72 ER R, 85 L, 63 IR R, 70 L in supine    Time  2    Period  Weeks    Status  Achieved    Target Date  06/07/17        PT Long Term Goals - 07/04/17 1440      PT LONG TERM GOAL #1   Title  Pt will improve ABC by at least 13% in order to demonstrate clinically significant improvement in balance confidence.     Baseline  04/15/17: 45%, 06/03/17: 59.7% initial goal achieved and advanced to an additional 13%, 07/04/17: 86.57%    Time  8    Period  Weeks     Status  Achieved    Target Date  07/29/17  PT LONG TERM GOAL #2   Title  Pt will decrease DHI score by at least 18 points in order to demonstrate clinically significant reduction in disability     Baseline  04/15/17: 68/100; 06/03/17: 54/100; 07/04/17: 12/100    Time  8    Period  Weeks    Status  Achieved    Target Date  07/29/17      PT LONG TERM GOAL #3   Title  Pt will improve DGI to 24/24 in order to demonstrate improved ability to perform head turns with ambulation as well as safety with stairs    Baseline  04/15/17: 21/24; 06/03/17: 23/24; 07/04/17: 24/24    Time  8    Period  Weeks    Status  Deferred    Target Date  07/29/17      PT LONG TERM GOAL #4   Title  Patient will report a worst pain of 3/10 on VAS in R shoulder  to improve tolerance with ADLs and reduced symptoms with activities.     Baseline  12/11: 7/10 pain, 06/27/17: 6/10, 07/04/17: 6/10 with IR stretch    Time  6    Period  Weeks    Status  On-going    Target Date  08/01/17      PT LONG TERM GOAL #5   Title  Patient will improve shoulder AROM to > 140 degrees of flexion, scaption, and abduction for improved ability to perform overhead activities    Baseline  12/11: flexion 72, abduction 54; 06/27/17: 135 flexion R, 160 L, 115 abduction R, 160 L, 68 ER R, 85 L, 60 IR R, 70 L in supine; 07/04/17: 142 flexion R, 160 L, 120 abduction R, 160 L, 72 ER R, 85 L, 63 IR R, 70 L in supine    Time  6    Period  Weeks    Status  On-going    Target Date  08/01/17      Additional Long Term Goals   Additional Long Term Goals  Yes      PT LONG TERM GOAL #6   Title  Patient will decrease Quick DASH score by > 8 points (73.8%)  demonstrating reduced self-reported upper extremity disability.    Baseline  12/11: 81.8%; 07/04/17: 36.36%    Time  6    Period  Weeks    Status  Achieved    Target Date  07/05/17      PT LONG TERM GOAL #7   Title  Patient will decrease Quick DASH Work score by > 8 points demonstrating reduced  self-reported upper extremity disability.    Baseline   12/11: 67%; 07/04/17: 36.36%    Time  6    Period  Weeks    Status  Achieved    Target Date  07/05/17      PT LONG TERM GOAL #8   Title  Patient will decrease Quick DASH score by > 16 points (below 20%)  demonstrating reduced self-reported upper extremity disability.    Baseline   12/11: 67%; 07/04/17: 36.36%,     Time  4    Period  Weeks    Status  New    Target Date  08/01/17      PT LONG TERM GOAL  #9   TITLE  Pt will be able to take dishes out of her overhead cupboard and fasten her bra in the back in order to return to full funciton at home    Baseline  07/04/17: unable    Time  4    Period  Weeks    Status  New    Target Date  08/01/17      PT LONG TERM GOAL  #10   TITLE  Pt will improve R shoulder strength by to at least 4/5 for flexion, abduction, IR, and ER in order to improve function at home and work    Baseline  07/04/17: 4-/5 for flexion, abduction, ER, and IR    Time  4    Period  Weeks    Status  New    Target Date  08/01/17            Plan - 07/07/17 4431    Clinical Impression Statement  Pt demonstrates continued progress with respect to her RUE ROM and strength. In addition she reports decreased pain in her R shoulder. Her AROM against gravity has improved and pt demonstrates good scapular control with only minimal hiking of R shoulder. Progressed HEP to include wall slides with low trap retraction. Pt demonstrates significant improvement in her sensory organization test today. Her composite score improved from 36 initially on 05/27/17 to 70 today. She only has 1 fall during the test today. Her vestibular systme remains the most limited and pt struggles the most with eyes closed activities. Pt encouraged to focus HEP on eyes closed activities on firm/foam surfaces. She has a patio seat cushion that she believes will work well for her HEP. Pt encouraged to follow-up as scheduled.     Clinical Presentation   Evolving    Clinical Decision Making  Moderate    Rehab Potential  Excellent    Clinical Impairments Affecting Rehab Potential  History of GB syndrome, comordities of fall, (+) continued progression of symptoms, acuity of problem.     PT Frequency  2x / week    PT Duration  4 weeks    PT Treatment/Interventions  Aquatic Therapy;ADLs/Self Care Home Management;Canalith Repostioning;Gait training;Stair training;DME Instruction;Functional mobility training;Therapeutic activities;Balance training;Therapeutic exercise;Neuromuscular re-education;Patient/family education;Manual techniques;Vestibular;Energy conservation    PT Next Visit Plan  Sensory organization test on neurocom, progress head turning activities with forward/retro ambulation in medical mall, eyes closed activities, body rolls eyes open/closed; Shoulder: progress strengthening and ROM as tolerated    PT Home Exercise Plan  07/07/17: added wall slides with lift off as well as clockwise/counter clockwise wall circles; 06/29/17: RUE: prayer table stretch, sidelying ER with weight, sidelying IR sleeper stretch, AROM flexion/abduction with mirror feedback, theraband rows in standing; Update vestibular 06/21/17: forward/retro ambulation with VOR x 1 horizontal, semitandem balance with horizontal head/body turns, semitandem/tandem balance with eyes closed,  Prior HEP during therapy: Standing horizontal/vertical smooth pursuit with conflicting background and marching, standing horizontal/vertical saccades with conflicting background and marching, standing balance with feet in staggered stance on carpet with eyes closed (progress to tandem), VOR x 1 horizontal in standing with conflicting backround and while marching, semitandem balance with horizontal head/body turns;    Consulted and Agree with Plan of Care  Patient       Patient will benefit from skilled therapeutic intervention in order to improve the following deficits and impairments:  Decreased  activity tolerance, Decreased balance, Dizziness  Visit Diagnosis: Acute pain of right shoulder  Stiffness of right shoulder, not elsewhere classified  Dizziness and giddiness     Problem List Patient Active Problem List   Diagnosis Date Noted  . Closed fracture of upper end of humerus 04/25/2017  . Concussion with loss of consciousness  04/10/2017  . 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 Encounter for screening colonoscopy 03/16/2013  . Routine general medical examination at a health care facility 03/16/2013  . Right leg weakness 01/23/2013  . History of Guillain-Barre syndrome 01/23/2013  . Radial fracture 01/23/2013  . 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 UNSPECIFIED VITAMIN D DEFICIENCY 08/26/2008  . UNSPECIFIED ANEMIA 08/26/2008  . Recurrent depressive disorder, current episode moderate ([place]) 07/29/2008  . Fatigue due to depression 07/29/2008  . WEIGHT LOSS 07/29/2008   Phillips Grout PT, DPT   Holly [name] 07/07/2017, 12:38 PM  [place] MAIN [facility] SERVICES [address] [place], [place], [address] Phone: [phone]   Fax:  [phone]  Name: Holly [name] MRN: [id number] Date of Birth: 1946/02/10

## 2017-07-12 ENCOUNTER — Ambulatory Visit: Payer: PRIVATE HEALTH INSURANCE

## 2017-07-12 ENCOUNTER — Ambulatory Visit (INDEPENDENT_AMBULATORY_CARE_PROVIDER_SITE_OTHER): Payer: Worker's Compensation | Admitting: Family Medicine

## 2017-07-12 ENCOUNTER — Encounter: Payer: Self-pay | Admitting: Family Medicine

## 2017-07-12 DIAGNOSIS — R42 Dizziness and giddiness: Secondary | ICD-10-CM

## 2017-07-12 DIAGNOSIS — S060X9D Concussion with loss of consciousness of unspecified duration, subsequent encounter: Secondary | ICD-10-CM

## 2017-07-12 DIAGNOSIS — M25511 Pain in right shoulder: Secondary | ICD-10-CM

## 2017-07-12 DIAGNOSIS — M25611 Stiffness of right shoulder, not elsewhere classified: Secondary | ICD-10-CM

## 2017-07-12 NOTE — Therapy (Signed)
[place] MAIN [facility] SERVICES [address] [place], [place], [address] Phone: [phone]   Fax:  [phone]  Physical Therapy Treatment  Patient Details  Name: Holly [name] MRN: [id number] Date of Birth: 1945/10/09 Referring Provider: Kurtis Bushman, MD   Encounter Date: 07/12/2017  PT End of Session - 07/12/17 1147    Visit Number  19    Number of Visits  37    Date for PT Re-Evaluation  08/01/17    Authorization Type  no g codes, workers compensation, vestibular cert ends 1/76/16    PT Start Time  0945    PT Stop Time  1040    PT Time Calculation (min)  55 min    Equipment Utilized During Treatment  Gait belt    Activity Tolerance  Patient tolerated treatment well    Behavior During Therapy  [facility] for tasks assessed/performed       Past Medical History:  Diagnosis Date  . Anxiety   . Depression   . Guillain Barr syndrome ([place]) 2005  . Osteoporosis   . Squamous cell skin cancer 2011    Past Surgical History:  Procedure Laterality Date  . ABDOMINAL HYSTERECTOMY  1989  . APPENDECTOMY  1960  . BREAST SURGERY  1980   biopsy  . WRIST FRACTURE SURGERY Right 2015    There were no vitals filed for this visit.  Subjective Assessment - 07/12/17 1119    Subjective  Pt states that she is doing well on this date. She reports that her dizziness is almost imperceptible at this point. She does note intermittent fatigue however in crowded spaces. She felt slightly unsteady the other day when she was walking on a road with traffic to go to a friends house. She is making progress with respect to her RUE mobility and strength. She has been performing HEP without any questions or concerns. Pain continues to decrease.     Pertinent History  Parts of history borrowed by MD. Patient reports on 10/13 she was going up a ramp and lost her footing, fell forward and onto right shoulder. Caused her to fracture nasal bone, proximal humerus. Pt states she  bit through her bottom lip. Reports she had loss of consciousness for a short period. Head CT showed nasal fracture but no IC bleed. RUE is immobilized and being managed conservatively. She complains of "heaviness" feeling in head and eyes. She is still having intermittent headaches. She is complaining of balance problems, dizziness, still feels weak and has trouble focusing. She complains of dizziness with quick head turns. Denies vertigo. No prior history of concussion. She had Guillan Barre in 2005 that causes shakiness in arms since then but otherwise has done well. She reports some mild RLE weakness since the [facility]. Denies any slurring of speech, difficulty, swallowing, or focal numbness/tingling/weakness since the concussion. ROS negative for red flags     Limitations  Reading    Diagnostic tests  plain films    Patient Stated Goals  to return to previous level of funcition.     Currently in Pain?  No/denies Denies pain at rest, 1/10 with AROM of RUE           TREATMENT  Ther-ex Supine R shoulderflexion3# weight2x 10 no increase in pain; L sidelying R shoulder abduction 2# weight x 10, no increase in pain; L sidelying R shoulder ER 3# weight x 10, no increase in pain; Standing AROM flexion against gravity, pt limited initially to  around 90 degrees due to weakness, added canes to assist and pt able to perform x 10 and then an addition 5 without cane (encouraged to add standing AROM flexion with canes to HEP) Standing bilateral UE arm wall slides with upper trap lift off x 15;  Manual Therapy  PROMwith10sholdsat end range in all planesxmultiple bouts, mild pain at end rangein all planes; R shoulder AP mobs at neutral, grade II-III as tolerated, 30s/bout x 3 bouts; R shoulder posterior/inferior glides at end range flexion, grade II-III as tolerated, 30s/bout x 2 bouts; R shoulder inferior mobs at 90 scaption, grade II-III as tolerated, 30s/bout x 3 bouts; Gentle GH  distraction with PROM through scaption;  Neuromuscular Re-education Forward ambulation with vertical ball toss in hallway x 40', no dizziness; Retro ambulation with ball pass over shoulder with head/eye follow, therapist varies height of return pass between waist and overhead heights, no dizziness but pt reports some imbalance and feelings like she is going to fall over backwards; VORx1 horizontal with forward/retro ambulation 35' x 3 each, 0.5-1/10 dizziness;                   PT Education - 07/12/17 1120    Education provided  Yes    Education Details  Plan of care and HEP progression for AROM in standing.     Person(s) Educated  Patient    Methods  Explanation    Comprehension  Verbalized understanding       PT Short Term Goals - 07/04/17 1433      PT SHORT TERM GOAL #1   Title  Pt will be independent with HEP in order to improve R shoulder ROM and strength in order to decrease fall risk and improve function at home and work.    Baseline  HEP given     Time  2    Period  Weeks    Status  On-going    Target Date  07/18/17      PT SHORT TERM GOAL #2   Title  Patient will report a worst pain of 5/10 on VAS in R shoulder   to improve tolerance with ADLs and reduced symptoms with activities.     Baseline  12/11: 7/10, 06/27/17: 6/10, 07/04/17: 6-7/10 worst with IR stretch    Time  2    Period  Weeks    Status  On-going    Target Date  07/18/17      PT SHORT TERM GOAL #3   Title  Patient will improve R shoulder AROM flexion and abduction to 80 degrees to improve ability to don and doff clothes     Baseline  flexion: 72 degrees, abduction 54 degrees; 06/27/17: 135 flexion R, 160 L, 115 abduction R, 160 L, 68 ER R, 85 L, 60 IR R, 70 L in supine; 07/04/17: 142 flexion R, 160 L, 120 abduction R, 160 L, 72 ER R, 85 L, 63 IR R, 70 L in supine    Time  2    Period  Weeks    Status  Achieved    Target Date  06/07/17        PT Long Term Goals - 07/04/17 1440      PT  LONG TERM GOAL #1   Title  Pt will improve ABC by at least 13% in order to demonstrate clinically significant improvement in balance confidence.     Baseline  04/15/17: 45%, 06/03/17: 59.7% initial goal achieved and advanced to an additional 13%,  07/04/17: 86.57%    Time  8    Period  Weeks    Status  Achieved    Target Date  07/29/17      PT LONG TERM GOAL #2   Title  Pt will decrease DHI score by at least 18 points in order to demonstrate clinically significant reduction in disability     Baseline  04/15/17: 68/100; 06/03/17: 54/100; 07/04/17: 12/100    Time  8    Period  Weeks    Status  Achieved    Target Date  07/29/17      PT LONG TERM GOAL #3   Title  Pt will improve DGI to 24/24 in order to demonstrate improved ability to perform head turns with ambulation as well as safety with stairs    Baseline  04/15/17: 21/24; 06/03/17: 23/24; 07/04/17: 24/24    Time  8    Period  Weeks    Status  Deferred    Target Date  07/29/17      PT LONG TERM GOAL #4   Title  Patient will report a worst pain of 3/10 on VAS in R shoulder  to improve tolerance with ADLs and reduced symptoms with activities.     Baseline  12/11: 7/10 pain, 06/27/17: 6/10, 07/04/17: 6/10 with IR stretch    Time  6    Period  Weeks    Status  On-going    Target Date  08/01/17      PT LONG TERM GOAL #5   Title  Patient will improve shoulder AROM to > 140 degrees of flexion, scaption, and abduction for improved ability to perform overhead activities    Baseline  12/11: flexion 72, abduction 54; 06/27/17: 135 flexion R, 160 L, 115 abduction R, 160 L, 68 ER R, 85 L, 60 IR R, 70 L in supine; 07/04/17: 142 flexion R, 160 L, 120 abduction R, 160 L, 72 ER R, 85 L, 63 IR R, 70 L in supine    Time  6    Period  Weeks    Status  On-going    Target Date  08/01/17      Additional Long Term Goals   Additional Long Term Goals  Yes      PT LONG TERM GOAL #6   Title  Patient will decrease Quick DASH score by > 8 points (73.8%)   demonstrating reduced self-reported upper extremity disability.    Baseline  12/11: 81.8%; 07/04/17: 36.36%    Time  6    Period  Weeks    Status  Achieved    Target Date  07/05/17      PT LONG TERM GOAL #7   Title  Patient will decrease Quick DASH Work score by > 8 points demonstrating reduced self-reported upper extremity disability.    Baseline   12/11: 67%; 07/04/17: 36.36%    Time  6    Period  Weeks    Status  Achieved    Target Date  07/05/17      PT LONG TERM GOAL #8   Title  Patient will decrease Quick DASH score by > 16 points (below 20%)  demonstrating reduced self-reported upper extremity disability.    Baseline   12/11: 67%; 07/04/17: 36.36%,     Time  4    Period  Weeks    Status  New    Target Date  08/01/17      PT LONG TERM GOAL  #9   TITLE  Pt will be able to take dishes out of her overhead cupboard and fasten her bra in the back in order to return to full funciton at home    Baseline  07/04/17: unable    Time  4    Period  Weeks    Status  New    Target Date  08/01/17      PT LONG TERM GOAL  #10   TITLE  Pt will improve R shoulder strength by to at least 4/5 for flexion, abduction, IR, and ER in order to improve function at home and work    Baseline  07/04/17: 4-/5 for flexion, abduction, ER, and IR    Time  4    Period  Weeks    Status  New    Target Date  08/01/17            Plan - 07/12/17 1147    Clinical Impression Statement  RUE ROM and strength are progressing with each session. Pt is very diligent about her HEP. Her pain is gradually decreasing and at this point her resting pain is 0/10. She continues to demonstrates notable strength deficits against gravity but demonstrates good scapular control and minimal shoulder hiking. Her dizziness is essentially imperceptible at this time and is only reproduced with very high level activities today such as backward ambulation with ball passes over shoulders. Even with these activities the dizziness severity  is 0.5-1/10 and mostly pt just complains of imbalance. Her composite score for the sensory organization test repeated last session demonstrated significant improved from the initial evaluation (36 initially improved to 70 on 07/07/17). From a balance/dizziness standpoint pt demonstrates adequate improvement and safety to gradually resume her normal functioning at home and work. She will benefit from continued therapy to resolve the remaining deficits until the initial certification period ends in mid February.     Clinical Presentation  Evolving    Clinical Decision Making  Moderate    Rehab Potential  Excellent    Clinical Impairments Affecting Rehab Potential  History of GB syndrome, comordities of fall, (+) continued progression of symptoms, acuity of problem.     PT Frequency  2x / week    PT Duration  4 weeks    PT Treatment/Interventions  Aquatic Therapy;ADLs/Self Care Home Management;Canalith Repostioning;Gait training;Stair training;DME Instruction;Functional mobility training;Therapeutic activities;Balance training;Therapeutic exercise;Neuromuscular re-education;Patient/family education;Manual techniques;Vestibular;Energy conservation    PT Next Visit Plan  head turning/ball pass activities with retro ambulation in medical mall, eyes closed activities on foam surfaces, Shoulder: progress strengthening and ROM as tolerated    PT Home Exercise Plan  07/07/17: added wall slides with lift off as well as clockwise/counter clockwise wall circles; 06/29/17: RUE: prayer table stretch, sidelying ER with weight, sidelying IR sleeper stretch, AROM flexion/abduction with mirror feedback, theraband rows in standing; Update vestibular 06/21/17: forward/retro ambulation with VOR x 1 horizontal, semitandem balance with horizontal head/body turns, semitandem/tandem balance with eyes closed,  Prior HEP during therapy: Standing horizontal/vertical smooth pursuit with conflicting background and marching, standing  horizontal/vertical saccades with conflicting background and marching, standing balance with feet in staggered stance on carpet with eyes closed (progress to tandem), VOR x 1 horizontal in standing with conflicting backround and while marching, semitandem balance with horizontal head/body turns;    Consulted and Agree with Plan of Care  Patient       Patient will benefit from skilled therapeutic intervention in order to improve the following deficits and impairments:  Decreased activity tolerance, Decreased balance, Dizziness  Visit Diagnosis: Acute pain of right shoulder  Stiffness of right shoulder, not elsewhere classified  Dizziness and giddiness     Problem List Patient Active Problem List   Diagnosis Date Noted  . Closed fracture of upper end of humerus 04/25/2017  . Concussion with loss of consciousness 04/10/2017  . 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 Encounter for screening colonoscopy 03/16/2013  . Routine general medical examination at a health care facility 03/16/2013  . Right leg weakness 01/23/2013  . History of Guillain-Barre syndrome 01/23/2013  . Radial fracture 01/23/2013  . 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 UNSPECIFIED VITAMIN D DEFICIENCY 08/26/2008  . UNSPECIFIED ANEMIA 08/26/2008  . Recurrent depressive disorder, current episode moderate ([place]) 07/29/2008  . Fatigue due to depression 07/29/2008  . WEIGHT LOSS 07/29/2008   Phillips Grout PT, DPT   Cervando Durnin 07/12/2017, 11:59 AM  [place] MAIN [facility] SERVICES [address] [place], [place], [address] Phone: [phone]   Fax:  [phone]  Name: Holly [name] MRN: [id number] Date of Birth: Sep 15, 1945

## 2017-07-12 NOTE — Patient Instructions (Signed)
You're doing great! Keep appointments for therapy over the next 2 weeks. Start back to work for 2 hour days for the next week then increase to 4 hours days and see me back in 2 weeks.

## 2017-07-13 ENCOUNTER — Encounter: Payer: Self-pay | Admitting: Family Medicine

## 2017-07-13 NOTE — Progress Notes (Signed)
PCP: Crecencio Mc, MD  Subjective:   HPI: Patient is a 72 y.o. female here for concussion.  10/25: Patient reports on 10/13 she was going up a ramp and lost her footing, fell forward and onto right shoulder. Caused her to fracture nasal bone, proximal humerus. Reports she had loss of consciousness for a short period. Had headache for 4-5 days with ~5/10 level of pain. Balance problems, dizziness, still feels weak and has trouble focusing. No nausea except from the percocet she took for fractures. No prior history of concussion. Feels things do not look 'quite straight' but denies wavy lines, loss of vision. No weakness, numbness in extremities. She had Guillan Barre in 2005 that causes shakiness in arms since then but otherwise has done well. SCAT 3 18/22 symptoms with score 82/132.  11/21: Patient reports she is doing well. Has done 2 visits of cognitive therapy and 4 visits of physical therapy.   No headache currently - last one two days ago. Still slow with concentration, difficulty expressing herself but improved. Has some dizziness, difficulty focusing at times. Waking up at night. No numbness or weakness. SCAT3 11/22 symptoms with score 33/132.  12/5: Patient reports she is improved since last visit. Has been to 5 visits of cognitive therapy, 6 visits of PT/vestibular rehab. Doing home exercises also. Finds the dizziness is the most frustrating but she has trouble with multitasking, concentrating, and gets tired faster. SCAT 3 9/22 symptoms, severity 16/132.  06/15/17: Patient reports headaches improving - last one 3 days ago. Not sleeping great. Feels something behind her eyes - heaviness, blurriness that comes and goes. Difficulty multitasking. Doing cognitive and PT/vestibular rehab but had about 1 week off of these due to holiday. SCAT3 9/22 symptoms, severity 32/132.  1/29: Patient reports she's doing very well. Excellent progress with vestibular therapy -  great report from them, balance improved. She states she walked for 45 minutes the other day and felt an extremely slight 'feeling' behind her eyes. Last headache was weeks ago now. No nausea.   Rare dizziness. SCAT3 0 symptoms now.  Past Medical History:  Diagnosis Date  . Anxiety   . Depression   . Guillain Barr syndrome ([place]) 2005  . Osteoporosis   . Squamous cell skin cancer 2011    Current Outpatient Medications on File Prior to Visit  Medication Sig Dispense Refill  . ARIPiprazole (ABILIFY) 5 MG tablet Take 2.5 mg by mouth daily.    . Calcium Carbonate-Vitamin D (CALCIUM 600+D HIGH POTENCY PO) Take 1 tablet by mouth daily.    . Cholecalciferol (VITAMIN D3) 2000 UNITS CHEW Chew 1 capsule by mouth daily. Reported on 08/29/2015    . clorazepate (TRANXENE) 7.5 MG tablet Take 7.5 mg by mouth 2 (two) times daily.     Marland Kitchen FLUoxetine (PROZAC) 20 MG tablet Take 20 mg by mouth daily.     . Levomilnacipran HCl ER (FETZIMA) 20 MG CP24 Take 20 mg by mouth.    Marland Kitchen LORazepam (ATIVAN) 1 MG tablet Take 1 mg by mouth at bedtime.  3  . oxyCODONE-acetaminophen (ROXICET) 5-325 MG tablet Take 1-2 tablets by mouth every 6 (six) hours as needed. (Patient not taking: Reported on 04/15/2017) 12 tablet 0  . tamsulosin (FLOMAX) 0.4 MG CAPS capsule Take 0.4 mg by mouth daily.  11  . temazepam (RESTORIL) 30 MG capsule Take 30 mg by mouth at bedtime as needed for sleep.    . traMADol (ULTRAM) 50 MG tablet Take by mouth every 6 (  six) hours as needed.     No current facility-administered medications on file prior to visit.     Past Surgical History:  Procedure Laterality Date  . ABDOMINAL HYSTERECTOMY  1989  . APPENDECTOMY  1960  . BREAST SURGERY  1980   biopsy  . WRIST FRACTURE SURGERY Right 2015    Allergies  Allergen Reactions  . Oxycodone-Acetaminophen Nausea And Vomiting  . Cephalexin     Other reaction(s): HIVES    Social History   Socioeconomic History  . Marital status: Widowed    Spouse  name: Not on file  . Number of children: Not on file  . Years of education: Not on file  . Highest education level: Not on file  Social Needs  . Financial resource strain: Not on file  . Food insecurity - worry: Not on file  . Food insecurity - inability: Not on file  . Transportation needs - medical: Not on file  . Transportation needs - non-medical: Not on file  Occupational History  . Not on file  Tobacco Use  . Smoking status: Former Smoker    Packs/day: 0.20    Types: Cigarettes    Last attempt to quit: 06/14/1977    Years since quitting: 40.1  . Smokeless tobacco: Never Used  Substance and Sexual Activity  . Alcohol use: Yes    Alcohol/week: 0.0 oz    Comment: social (about once a month)  . Drug use: No  . Sexual activity: No  Other Topics Concern  . Not on file  Social History Narrative  . Not on file    Family History  Problem Relation Age of Onset  . Cancer Mother   . Mental illness Sister   . Bipolar disorder Sister   . Depression Brother   . Mental illness Brother   . Parkinson's disease Brother     BP 134/82   Pulse 76   Ht 5\' 6"  (1.676 m)   Wt 142 lb (64.4 kg)   BMI 22.92 kg/m   Review of Systems: See HPI above.     Objective:  Physical Exam:  Gen: NAD, comfortable in exam room.  Neuro: Orientation 5/5 Immediate memory 14/15 Concentration 4/5 Neck FROM without pain Balance 0 errors double leg, tandem.  2 errors single leg Coordination normal bilaterally finger to nose Delayed recall 5/5 Horizontal, vertical saccades, fixed gaze all 30 trials without symptoms.   Assessment & Plan:  1. Concussion with loss of consciousness - patient's first concussion.  Much improved and done well with vestibular/oculomotor therapy.  Encouraged continued therapy for 2 more weeks.  Return to work for 2 hour days for a week without other restrictions, then up to 4 hours a day, with plans to see her back in 2 weeks for reevaluation.  Total visit time 30  minutes - > 50% of which spent on counseling, answering questions.

## 2017-07-13 NOTE — Assessment & Plan Note (Signed)
patient's first concussion.  Much improved and done well with vestibular/oculomotor therapy.  Encouraged continued therapy for 2 more weeks.  Return to work for 2 hour days for a week without other restrictions, then up to 4 hours a day, with plans to see her back in 2 weeks for reevaluation.  Total visit time 30 minutes - > 50% of which spent on counseling, answering questions.

## 2017-07-14 ENCOUNTER — Ambulatory Visit: Payer: PRIVATE HEALTH INSURANCE

## 2017-07-14 DIAGNOSIS — M25511 Pain in right shoulder: Secondary | ICD-10-CM | POA: Diagnosis not present

## 2017-07-14 DIAGNOSIS — M25611 Stiffness of right shoulder, not elsewhere classified: Secondary | ICD-10-CM

## 2017-07-14 NOTE — Therapy (Signed)
[facility] [address], [place], [address] Phone: [phone]   Fax:  [phone]  Physical Therapy Treatment  Patient Details  Name: Holly [name] MRN: [id number] Date of Birth: 1945/09/08 Referring Provider: Kurtis Bushman, MD   Encounter Date: 07/14/2017  PT End of Session - 07/14/17 1145    Visit Number  20    Number of Visits  37    Date for PT Re-Evaluation  08/01/17    Authorization Type  no g codes, workers compensation, vestibular cert ends 9/62/22    PT Start Time  0945    PT Stop Time  1045    PT Time Calculation (min)  60 min    Equipment Utilized During Treatment  Gait belt    Activity Tolerance  Patient tolerated treatment well    Behavior During Therapy  [facility] for tasks assessed/performed       Past Medical History:  Diagnosis Date  . Anxiety   . Depression   . Guillain Barr syndrome ([place]) 2005  . Osteoporosis   . Squamous cell skin cancer 2011    Past Surgical History:  Procedure Laterality Date  . ABDOMINAL HYSTERECTOMY  1989  . APPENDECTOMY  1960  . BREAST SURGERY  1980   biopsy  . WRIST FRACTURE SURGERY Right 2015    There were no vitals filed for this visit.  Subjective Assessment - 07/14/17 0950    Subjective  Pt reports that she is doing well today. She saw Dr. Barbaraann Barthel and he released her to start a limited work schedule (2hr/day). Per patient he was very pleased with her progress. She is performing HEP and has been incorporating canes for R shoulder flexion in standing. No specific questions or concerns at this time.     Pertinent History  Parts of history borrowed by MD. Patient reports on 10/13 she was going up a ramp and lost her footing, fell forward and onto right shoulder. Caused her to fracture nasal bone, proximal humerus. Pt states she bit through her bottom lip. Reports she had loss of consciousness for a short period. Head CT showed nasal fracture but no IC  bleed. RUE is immobilized and being managed conservatively. She complains of "heaviness" feeling in head and eyes. She is still having intermittent headaches. She is complaining of balance problems, dizziness, still feels weak and has trouble focusing. She complains of dizziness with quick head turns. Denies vertigo. No prior history of concussion. She had Guillan Barre in 2005 that causes shakiness in arms since then but otherwise has done well. She reports some mild RLE weakness since the [facility]. Denies any slurring of speech, difficulty, swallowing, or focal numbness/tingling/weakness since the concussion. ROS negative for red flags     Limitations  Reading    Diagnostic tests  plain films    Patient Stated Goals  to return to previous level of funcition.     Currently in Pain?  No/denies         TREATMENT  Ther-ex Supine shoulder stabilzation at 90 degrees flexion, resistance above elbow 30s x 2; Supine serratus punch with manual resistance 2 x 10; Standing AROM flexion against gravity with cane assist x 15; Standing AROM flexion with 1# weight x 15; Standing AROM scaption, pt unable to go past 90 degrees, added cane and performed AAROM against gravity scaption x 10; Standing R shoulder red tband resisted ER 2 x 15; Omega rows 20# 2 x 15; Wall  push-up plus 2 x 15; Ball rolls on wall with R shoulder at 90 degree flexion x 20 CW and CCW; NuStep L5 at end of session x 15 minutes (unbilled);  Manual Therapy  PROMwith10sholdsat end range in all planesxmultiple bouts, mild pain at end rangein all planes; R shoulder AP mobs at neutral, grade II-III as tolerated, 30s/bout x 3 bouts; R shoulder posterior/inferior glides at end range flexion, grade II-III as tolerated, 30s/bout x 2 bouts; R shoulder inferior mobs at 90 scaption, grade II-III as tolerated, 30s/bout x 3 bouts; Gentle GH distraction with PROM through scaption;                     PT  Education - 07/14/17 1144    Education provided  Yes    Education Details  exercise form/technique, HEP progresion    Person(s) Educated  Patient    Methods  Explanation    Comprehension  Verbalized understanding       PT Short Term Goals - 07/04/17 1433      PT SHORT TERM GOAL #1   Title  Pt will be independent with HEP in order to improve R shoulder ROM and strength in order to decrease fall risk and improve function at home and work.    Baseline  HEP given     Time  2    Period  Weeks    Status  On-going    Target Date  07/18/17      PT SHORT TERM GOAL #2   Title  Patient will report a worst pain of 5/10 on VAS in R shoulder   to improve tolerance with ADLs and reduced symptoms with activities.     Baseline  12/11: 7/10, 06/27/17: 6/10, 07/04/17: 6-7/10 worst with IR stretch    Time  2    Period  Weeks    Status  On-going    Target Date  07/18/17      PT SHORT TERM GOAL #3   Title  Patient will improve R shoulder AROM flexion and abduction to 80 degrees to improve ability to don and doff clothes     Baseline  flexion: 72 degrees, abduction 54 degrees; 06/27/17: 135 flexion R, 160 L, 115 abduction R, 160 L, 68 ER R, 85 L, 60 IR R, 70 L in supine; 07/04/17: 142 flexion R, 160 L, 120 abduction R, 160 L, 72 ER R, 85 L, 63 IR R, 70 L in supine    Time  2    Period  Weeks    Status  Achieved    Target Date  06/07/17        PT Long Term Goals - 07/04/17 1440      PT LONG TERM GOAL #1   Title  Pt will improve ABC by at least 13% in order to demonstrate clinically significant improvement in balance confidence.     Baseline  04/15/17: 45%, 06/03/17: 59.7% initial goal achieved and advanced to an additional 13%, 07/04/17: 86.57%    Time  8    Period  Weeks    Status  Achieved    Target Date  07/29/17      PT LONG TERM GOAL #2   Title  Pt will decrease DHI score by at least 18 points in order to demonstrate clinically significant reduction in disability     Baseline  04/15/17:  68/100; 06/03/17: 54/100; 07/04/17: 12/100    Time  8    Period  [place]  Status  Achieved    Target Date  07/29/17      PT LONG TERM GOAL #3   Title  Pt will improve DGI to 24/24 in order to demonstrate improved ability to perform head turns with ambulation as well as safety with stairs    Baseline  04/15/17: 21/24; 06/03/17: 23/24; 07/04/17: 24/24    Time  8    Period  Weeks    Status  Deferred    Target Date  07/29/17      PT LONG TERM GOAL #4   Title  Patient will report a worst pain of 3/10 on VAS in R shoulder  to improve tolerance with ADLs and reduced symptoms with activities.     Baseline  12/11: 7/10 pain, 06/27/17: 6/10, 07/04/17: 6/10 with IR stretch    Time  6    Period  Weeks    Status  On-going    Target Date  08/01/17      PT LONG TERM GOAL #5   Title  Patient will improve shoulder AROM to > 140 degrees of flexion, scaption, and abduction for improved ability to perform overhead activities    Baseline  12/11: flexion 72, abduction 54; 06/27/17: 135 flexion R, 160 L, 115 abduction R, 160 L, 68 ER R, 85 L, 60 IR R, 70 L in supine; 07/04/17: 142 flexion R, 160 L, 120 abduction R, 160 L, 72 ER R, 85 L, 63 IR R, 70 L in supine    Time  6    Period  Weeks    Status  On-going    Target Date  08/01/17      Additional Long Term Goals   Additional Long Term Goals  Yes      PT LONG TERM GOAL #6   Title  Patient will decrease Quick DASH score by > 8 points (73.8%)  demonstrating reduced self-reported upper extremity disability.    Baseline  12/11: 81.8%; 07/04/17: 36.36%    Time  6    Period  Weeks    Status  Achieved    Target Date  07/05/17      PT LONG TERM GOAL #7   Title  Patient will decrease Quick DASH Work score by > 8 points demonstrating reduced self-reported upper extremity disability.    Baseline   12/11: 67%; 07/04/17: 36.36%    Time  6    Period  Weeks    Status  Achieved    Target Date  07/05/17      PT LONG TERM GOAL #8   Title  Patient will decrease Quick  DASH score by > 16 points (below 20%)  demonstrating reduced self-reported upper extremity disability.    Baseline   12/11: 67%; 07/04/17: 36.36%,     Time  4    Period  Weeks    Status  New    Target Date  08/01/17      PT LONG TERM GOAL  #9   TITLE  Pt will be able to take dishes out of her overhead cupboard and fasten her bra in the back in order to return to full funciton at home    Baseline  07/04/17: unable    Time  4    Period  Weeks    Status  New    Target Date  08/01/17      PT LONG TERM GOAL  #10   TITLE  Pt will improve R shoulder strength by to at least 4/5 for  flexion, abduction, IR, and ER in order to improve function at home and work    Baseline  07/04/17: 4-/5 for flexion, abduction, ER, and IR    Time  4    Period  Weeks    Status  New    Target Date  08/01/17            Plan - 07/14/17 1024    Clinical Impression Statement  Pt is making excellent progress on this date. She is able to add weight to standing shoulder flexion. Good motor control with minimal R shoulder hiking. She continues to lack AROM against gravity in abduction. Encouraged pt to add canes to her HEP in standing for abduction. Pt encouraged to continue additional HEP and follow-up as scheduled.     Rehab Potential  Excellent    Clinical Impairments Affecting Rehab Potential  History of GB syndrome, comordities of fall, (+) continued progression of symptoms, acuity of problem.     PT Frequency  2x / week    PT Duration  4 weeks    PT Treatment/Interventions  Aquatic Therapy;ADLs/Self Care Home Management;Canalith Repostioning;Gait training;Stair training;DME Instruction;Functional mobility training;Therapeutic activities;Balance training;Therapeutic exercise;Neuromuscular re-education;Patient/family education;Manual techniques;Vestibular;Energy conservation    PT Next Visit Plan  head turning/ball pass activities with retro ambulation in medical mall, eyes closed activities on foam surfaces,  Shoulder: progress strengthening and ROM as tolerated    PT Home Exercise Plan  07/14/17: added standing shoulder flexion with 1# weight, standing canes for abduction; 07/07/17: added wall slides with lift off as well as clockwise/counter clockwise wall circles; 06/29/17: RUE: prayer table stretch, sidelying ER with weight, sidelying IR sleeper stretch, AROM flexion/abduction with mirror feedback, theraband rows in standing; Update vestibular 06/21/17: forward/retro ambulation with VOR x 1 horizontal, semitandem balance with horizontal head/body turns, semitandem/tandem balance with eyes closed,  Prior HEP during therapy: Standing horizontal/vertical smooth pursuit with conflicting background and marching, standing horizontal/vertical saccades with conflicting background and marching, standing balance with feet in staggered stance on carpet with eyes closed (progress to tandem), VOR x 1 horizontal in standing with conflicting backround and while marching, semitandem balance with horizontal head/body turns;    Consulted and Agree with Plan of Care  Patient       Patient will benefit from skilled therapeutic intervention in order to improve the following deficits and impairments:  Decreased activity tolerance, Decreased balance, Dizziness  Visit Diagnosis: Acute pain of right shoulder  Stiffness of right shoulder, not elsewhere classified     Problem List Patient Active Problem List   Diagnosis Date Noted  . Closed fracture of upper end of humerus 04/25/2017  . Concussion with loss of consciousness 04/10/2017  . 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 Encounter for screening colonoscopy 03/16/2013  . Routine general medical examination at a health care facility 03/16/2013  . Right leg weakness 01/23/2013  . History of Guillain-Barre syndrome 01/23/2013  . Radial fracture 01/23/2013  . 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 UNSPECIFIED VITAMIN D DEFICIENCY 08/26/2008  . UNSPECIFIED ANEMIA 08/26/2008   . Recurrent depressive disorder, current episode moderate ([place]) 07/29/2008  . Fatigue due to depression 07/29/2008  . WEIGHT LOSS 07/29/2008   Phillips Grout PT, DPT   Huprich,Jason 07/14/2017, 12:06 PM  [facility] [address], [place], [address] Phone: [phone]   Fax:  [phone]  Name: CAROLYNN TULEY MRN: [id number] Date of Birth: September 12, 1945

## 2017-07-19 ENCOUNTER — Ambulatory Visit: Payer: PRIVATE HEALTH INSURANCE | Attending: Family Medicine

## 2017-07-19 DIAGNOSIS — M25611 Stiffness of right shoulder, not elsewhere classified: Secondary | ICD-10-CM | POA: Diagnosis present

## 2017-07-19 DIAGNOSIS — M25511 Pain in right shoulder: Secondary | ICD-10-CM | POA: Insufficient documentation

## 2017-07-19 DIAGNOSIS — R41841 Cognitive communication deficit: Secondary | ICD-10-CM | POA: Diagnosis present

## 2017-07-19 DIAGNOSIS — R42 Dizziness and giddiness: Secondary | ICD-10-CM | POA: Diagnosis present

## 2017-07-19 NOTE — Therapy (Signed)
[place] MAIN [facility] SERVICES [address] [place], [place], [address] Phone: [phone]   Fax:  [phone]  Physical Therapy Treatment  Patient Details  Name: Holly [name] MRN: [id number] Date of Birth: 03/05/1946 Referring Provider: Kurtis Bushman, MD   Encounter Date: 07/19/2017  PT End of Session - 07/19/17 1211    Visit Number  21    Number of Visits  37    Date for PT Re-Evaluation  08/01/17    Authorization Type  no g codes, workers compensation, vestibular cert ends 7/37/10    PT Start Time  1015    PT Stop Time  1115    PT Time Calculation (min)  60 min    Equipment Utilized During Treatment  Gait belt    Activity Tolerance  Patient tolerated treatment well    Behavior During Therapy  [facility] for tasks assessed/performed       Past Medical History:  Diagnosis Date  . Anxiety   . Depression   . Guillain Barr syndrome ([place]) 2005  . Osteoporosis   . Squamous cell skin cancer 2011    Past Surgical History:  Procedure Laterality Date  . ABDOMINAL HYSTERECTOMY  1989  . APPENDECTOMY  1960  . BREAST SURGERY  1980   biopsy  . WRIST FRACTURE SURGERY Right 2015    There were no vitals filed for this visit.  Subjective Assessment - 07/19/17 1022    Subjective  Pt reports a little more R shoulder soreness today upon arrival. She denies any more episodes of dizziness. HEP is going well and she added wall push ups to her routine. She started back at work yesterday for 2 hours and will be working from 8-10 every day this week. She states that she did feel very fatigued after work yesterday and went home and took a nap. She felt a little better this morning after working. No specific questions at this time.     Pertinent History  Parts of history borrowed by MD. Patient reports on 10/13 she was going up a ramp and lost her footing, fell forward and onto right shoulder. Caused her to fracture nasal bone, proximal humerus. Pt states she bit  through her bottom lip. Reports she had loss of consciousness for a short period. Head CT showed nasal fracture but no IC bleed. RUE is immobilized and being managed conservatively. She complains of "heaviness" feeling in head and eyes. She is still having intermittent headaches. She is complaining of balance problems, dizziness, still feels weak and has trouble focusing. She complains of dizziness with quick head turns. Denies vertigo. No prior history of concussion. She had Guillan Barre in 2005 that causes shakiness in arms since then but otherwise has done well. She reports some mild RLE weakness since the [facility]. Denies any slurring of speech, difficulty, swallowing, or focal numbness/tingling/weakness since the concussion. ROS negative for red flags     Limitations  Reading    Diagnostic tests  plain films    Patient Stated Goals  to return to previous level of funcition.     Currently in Pain?  Yes    Pain Score  1     Pain Location  Shoulder    Pain Orientation  Right    Pain Descriptors / Indicators  Aching    Pain Type  Acute pain    Pain Onset  More than a month ago    Pain Frequency  Intermittent  TREATMENT  Ther-ex UBE x 4 minutes (2 forward/backwards) at start of session for warm-up (unbilled); Standing AROM R shoulder flexion against gravity with cane assist x 15; Standing AROM scaption against gravity with cane assist x 15; Standing AROM scaption against gravity with scapular assist MWM 2 x 10; Standing R shoulder red tband resisted ER 2 x 15, cues for elbow at side; Standing AROM flexion with 2# weight 2 x 10; Lat pull down on Matrix 25# x 10, 35# x 10, 45# x 10, quality degrades after 5 repetitions of last set and ROM decreases; Incline push-ups on mat table 2 x 10; Standing matrix rows 25# 2 x 10;  Manual Therapy PROMwith10sholdsat end range in all planesxmultiple bouts, mild pain at end rangein all planes; R shoulder AP mobs at neutral and  then at 90 flexion, grade II-III as tolerated, 30s/bout x 3 bouts; R shoulder posterior/inferior glides at end range flexion, grade II-III as tolerated, 30s/bout x 2 bouts; R shoulder AP mobs at 90 abduction and end range ER, grade II-III as tolerated, 20s/bout x 3 bouts; R shoulder inferior mobs at 90 scaption, grade II-III as tolerated, 30s/bout x 3 bouts; Gentle GH distraction with PROM through scaption;                      PT Education - 07/19/17 1210    Education provided  Yes    Education Details  exercise form/techniqe, no HEP progression today    Person(s) Educated  Patient    Methods  Explanation    Comprehension  Verbalized understanding       PT Short Term Goals - 07/04/17 1433      PT SHORT TERM GOAL #1   Title  Pt will be independent with HEP in order to improve R shoulder ROM and strength in order to decrease fall risk and improve function at home and work.    Baseline  HEP given     Time  2    Period  Weeks    Status  On-going    Target Date  07/18/17      PT SHORT TERM GOAL #2   Title  Patient will report a worst pain of 5/10 on VAS in R shoulder   to improve tolerance with ADLs and reduced symptoms with activities.     Baseline  12/11: 7/10, 06/27/17: 6/10, 07/04/17: 6-7/10 worst with IR stretch    Time  2    Period  Weeks    Status  On-going    Target Date  07/18/17      PT SHORT TERM GOAL #3   Title  Patient will improve R shoulder AROM flexion and abduction to 80 degrees to improve ability to don and doff clothes     Baseline  flexion: 72 degrees, abduction 54 degrees; 06/27/17: 135 flexion R, 160 L, 115 abduction R, 160 L, 68 ER R, 85 L, 60 IR R, 70 L in supine; 07/04/17: 142 flexion R, 160 L, 120 abduction R, 160 L, 72 ER R, 85 L, 63 IR R, 70 L in supine    Time  2    Period  Weeks    Status  Achieved    Target Date  06/07/17        PT Long Term Goals - 07/04/17 1440      PT LONG TERM GOAL #1   Title  Pt will improve ABC by at  least 13% in order to demonstrate clinically  significant improvement in balance confidence.     Baseline  04/15/17: 45%, 06/03/17: 59.7% initial goal achieved and advanced to an additional 13%, 07/04/17: 86.57%    Time  8    Period  Weeks    Status  Achieved    Target Date  07/29/17      PT LONG TERM GOAL #2   Title  Pt will decrease DHI score by at least 18 points in order to demonstrate clinically significant reduction in disability     Baseline  04/15/17: 68/100; 06/03/17: 54/100; 07/04/17: 12/100    Time  8    Period  Weeks    Status  Achieved    Target Date  07/29/17      PT LONG TERM GOAL #3   Title  Pt will improve DGI to 24/24 in order to demonstrate improved ability to perform head turns with ambulation as well as safety with stairs    Baseline  04/15/17: 21/24; 06/03/17: 23/24; 07/04/17: 24/24    Time  8    Period  Weeks    Status  Deferred    Target Date  07/29/17      PT LONG TERM GOAL #4   Title  Patient will report a worst pain of 3/10 on VAS in R shoulder  to improve tolerance with ADLs and reduced symptoms with activities.     Baseline  12/11: 7/10 pain, 06/27/17: 6/10, 07/04/17: 6/10 with IR stretch    Time  6    Period  Weeks    Status  On-going    Target Date  08/01/17      PT LONG TERM GOAL #5   Title  Patient will improve shoulder AROM to > 140 degrees of flexion, scaption, and abduction for improved ability to perform overhead activities    Baseline  12/11: flexion 72, abduction 54; 06/27/17: 135 flexion R, 160 L, 115 abduction R, 160 L, 68 ER R, 85 L, 60 IR R, 70 L in supine; 07/04/17: 142 flexion R, 160 L, 120 abduction R, 160 L, 72 ER R, 85 L, 63 IR R, 70 L in supine    Time  6    Period  Weeks    Status  On-going    Target Date  08/01/17      Additional Long Term Goals   Additional Long Term Goals  Yes      PT LONG TERM GOAL #6   Title  Patient will decrease Quick DASH score by > 8 points (73.8%)  demonstrating reduced self-reported upper extremity  disability.    Baseline  12/11: 81.8%; 07/04/17: 36.36%    Time  6    Period  Weeks    Status  Achieved    Target Date  07/05/17      PT LONG TERM GOAL #7   Title  Patient will decrease Quick DASH Work score by > 8 points demonstrating reduced self-reported upper extremity disability.    Baseline   12/11: 67%; 07/04/17: 36.36%    Time  6    Period  Weeks    Status  Achieved    Target Date  07/05/17      PT LONG TERM GOAL #8   Title  Patient will decrease Quick DASH score by > 16 points (below 20%)  demonstrating reduced self-reported upper extremity disability.    Baseline   12/11: 67%; 07/04/17: 36.36%,     Time  4    Period  Weeks    Status  New    Target Date  08/01/17      PT LONG TERM GOAL  #9   TITLE  Pt will be able to take dishes out of her overhead cupboard and fasten her bra in the back in order to return to full funciton at home    Baseline  07/04/17: unable    Time  4    Period  Weeks    Status  New    Target Date  08/01/17      PT LONG TERM GOAL  #10   TITLE  Pt will improve R shoulder strength by to at least 4/5 for flexion, abduction, IR, and ER in order to improve function at home and work    Baseline  07/04/17: 4-/5 for flexion, abduction, ER, and IR    Time  4    Period  Weeks    Status  New    Target Date  08/01/17            Plan - 07/19/17 1216    Clinical Impression Statement  Pt continues to make great progress today. All exercises today performed in standing. She is able to add more weight to shoulder flexion and is also able to increase difficulty of push ups to an incline. No HEP progression today. Will continue to progress R shoulder strength as tolerated. Pt encouraged to continue HEP and follow-up as scheduled.     Rehab Potential  Excellent    Clinical Impairments Affecting Rehab Potential  History of GB syndrome, comordities of fall, (+) continued progression of symptoms, acuity of problem.     PT Frequency  2x / week    PT Duration  4  weeks    PT Treatment/Interventions  Aquatic Therapy;ADLs/Self Care Home Management;Canalith Repostioning;Gait training;Stair training;DME Instruction;Functional mobility training;Therapeutic activities;Balance training;Therapeutic exercise;Neuromuscular re-education;Patient/family education;Manual techniques;Vestibular;Energy conservation    PT Next Visit Plan  head turning/ball pass activities with retro ambulation in medical mall, eyes closed activities on foam surfaces, Shoulder: progress strengthening and ROM as tolerated    PT Home Exercise Plan  07/14/17: added standing shoulder flexion with 1# weight, standing canes for abduction; 07/07/17: added wall slides with lift off as well as clockwise/counter clockwise wall circles; 06/29/17: RUE: prayer table stretch, sidelying ER with weight, sidelying IR sleeper stretch, AROM flexion/abduction with mirror feedback, theraband rows in standing; Update vestibular 06/21/17: forward/retro ambulation with VOR x 1 horizontal, semitandem balance with horizontal head/body turns, semitandem/tandem balance with eyes closed,  Prior HEP during therapy: Standing horizontal/vertical smooth pursuit with conflicting background and marching, standing horizontal/vertical saccades with conflicting background and marching, standing balance with feet in staggered stance on carpet with eyes closed (progress to tandem), VOR x 1 horizontal in standing with conflicting backround and while marching, semitandem balance with horizontal head/body turns;    Consulted and Agree with Plan of Care  Patient       Patient will benefit from skilled therapeutic intervention in order to improve the following deficits and impairments:  Decreased activity tolerance, Decreased balance, Dizziness  Visit Diagnosis: Acute pain of right shoulder  Stiffness of right shoulder, not elsewhere classified     Problem List Patient Active Problem List   Diagnosis Date Noted  . Closed fracture of upper  end of humerus 04/25/2017  . Concussion with loss of consciousness 04/10/2017  . 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 Encounter for screening colonoscopy 03/16/2013  . Routine general medical examination at a health care facility 03/16/2013  . Right leg weakness  01/23/2013  . History of Guillain-Barre syndrome 01/23/2013  . Radial fracture 01/23/2013  . 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 UNSPECIFIED VITAMIN D DEFICIENCY 08/26/2008  . UNSPECIFIED ANEMIA 08/26/2008  . Recurrent depressive disorder, current episode moderate ([place]) 07/29/2008  . Fatigue due to depression 07/29/2008  . WEIGHT LOSS 07/29/2008   Phillips Grout PT, DPT   Huprich,Jason 07/19/2017, 12:25 PM  [place] MAIN [facility] SERVICES [address] [place], [place], [address] Phone: [phone]   Fax:  [phone]  Name: RYLIN SAEZ MRN: [id number] Date of Birth: 1945-11-24

## 2017-07-21 ENCOUNTER — Telehealth: Payer: Self-pay | Admitting: Family Medicine

## 2017-07-21 NOTE — Telephone Encounter (Signed)
Patient's nurse case manager calling regarding follow up appointment.  Patient's employer did not start her back to work until this week. She would like to know if patient needs to push follow up appointment back one week so she can work for two weeks as put in work note.

## 2017-07-21 NOTE — Telephone Encounter (Signed)
Yes, thank you.  I would move her appointment back 1 week then.

## 2017-07-21 NOTE — Telephone Encounter (Signed)
Ok thanks 

## 2017-07-21 NOTE — Telephone Encounter (Signed)
Contacted NCM and she stated the w/c adjustor wants to keep the appointment for next week

## 2017-07-22 ENCOUNTER — Ambulatory Visit: Payer: PRIVATE HEALTH INSURANCE

## 2017-07-22 ENCOUNTER — Ambulatory Visit: Payer: PRIVATE HEALTH INSURANCE | Admitting: Speech Pathology

## 2017-07-22 ENCOUNTER — Encounter: Payer: Self-pay | Admitting: Speech Pathology

## 2017-07-22 DIAGNOSIS — M25511 Pain in right shoulder: Secondary | ICD-10-CM

## 2017-07-22 DIAGNOSIS — R42 Dizziness and giddiness: Secondary | ICD-10-CM

## 2017-07-22 DIAGNOSIS — R41841 Cognitive communication deficit: Secondary | ICD-10-CM

## 2017-07-22 NOTE — Therapy (Signed)
[place] MAIN [facility] SERVICES [address] [place], [place], [address] Phone: [phone]   Fax:  [phone]  Speech Language Pathology Treatment  Patient Details  Name: TANAZIA ACHEE MRN: [id number] Date of Birth: Nov 12, 1945 Referring Provider: Dene Gentry    Encounter Date: 07/22/2017  End of Session - 07/22/17 1240    Visit Number  16    Number of Visits  17    Date for SLP Re-Evaluation  08/11/17    SLP Start Time  1100    SLP Stop Time   1155    SLP Time Calculation (min)  55 min    Activity Tolerance  Patient tolerated treatment well       Past Medical History:  Diagnosis Date  . Anxiety   . Depression   . Guillain Barr syndrome ([place]) 2005  . Osteoporosis   . Squamous cell skin cancer 2011    Past Surgical History:  Procedure Laterality Date  . ABDOMINAL HYSTERECTOMY  1989  . APPENDECTOMY  1960  . BREAST SURGERY  1980   biopsy  . WRIST FRACTURE SURGERY Right 2015    There were no vitals filed for this visit.  Subjective Assessment - 07/22/17 1239    Subjective  Patient is recalling computer processes at work            ADULT SLP TREATMENT - 07/22/17 0001      General Information   Behavior/Cognition  Alert;Cooperative;Pleasant mood    HPI  Per MD: Patient is a 72 y.o. female here for concussion.  Patient reports on 10/13 she was going up a ramp and lost her footing, fell forward and onto right shoulder.  Caused her to fracture nasal bone, proximal humerus.  Reports she had loss of consciousness for a short period.  Had headache for 4-5 days with ~5/10 level of pain.  Balance problems, dizziness, still feels weak and has trouble focusing.  No nausea except from the percocet she took for fractures.  No prior history of concussion.  Feels things do not look 'quite straight' but denies wavy lines, loss of vision.  No weakness, numbness in extremities.  She had Guillan Barre in 2005 that causes shakiness in arms  since then but otherwise has done well.       Treatment Provided   Treatment provided  Cognitive-Linquistic      Pain Assessment   Pain Assessment  No/denies pain      Cognitive-Linquistic Treatment   Treatment focused on  Cognition;Patient/family/caregiver education    Skilled Treatment  COGNTION: Completed 3 simple Perplexor puzzles with min cues understand new reasoning concept and no cues to recall "rules" (strategies to complete these puzzles).  Patient able to verbalize information provided in clue and what it means in regard to puzzle solving grid with cues.  MEMORY: Patient reports that she is back at work, 2 hours a day and will increase to 4 hours a day next week.  She reports that she is recalling processes and her work is slower but accurate.        Assessment / Recommendations / Plan   Plan  Continue with current plan of care      Progression Toward Goals   Progression toward goals  Progressing toward goals       SLP Education - 07/22/17 1239    Education provided  Yes    Education Details  monitor for signs of overdoing it at work    [employer])  Educated  Patient    Methods  Explanation    Comprehension  Verbalized understanding         SLP Long Term Goals - 06/01/17 1243      SLP LONG TERM GOAL #1   Title  Patient will identify cognitive-communication barriers and participate in developing functional compensatory strategies.    Time  4    Period  Weeks    Status  Partially Met    Target Date  07/01/17      SLP LONG TERM GOAL #2   Title  Patient will complete complex attention, executive function, memory, and visual-spatial activities with 80% accuracy.    Time  4    Period  Weeks    Status  Partially Met    Target Date  07/01/17      SLP LONG TERM GOAL #3   Title  Patient will demonstrate functional cognitive-communication skills for successful return to work.    Time  4    Period  Weeks    Status  Partially Met    Target Date  07/01/17      SLP LONG  TERM GOAL #4   Title  Patient will generate grammatical, fluent, and cogent sentence to complete abstract/complex linguistic task with 80% accuracy.    Time  4    Period  Weeks    Status  Partially Met    Target Date  07/01/17       Plan - 07/22/17 1241    Clinical Impression Statement  The patient is able to complete simple cognitive tasks accurately at a slower pace.  The patient demonstrates adequate memory to complete more complex cognitive tasks and is exhibiting less anxiety as the complexity increases.  The patient demonstrates improved ability to verbalize reasoning during therapeutic tasks with overall improved performance and independence for these tasks.    Speech Therapy Frequency  2x / week    Duration  4 weeks    Treatment/Interventions  Cognitive reorganization;Internal/external aids;Compensatory strategies;SLP instruction and feedback;Patient/family education    Potential to Achieve Goals  Good    Potential Considerations  Ability to learn/carryover information;Co-morbidities;Cooperation/participation level;Medical prognosis;Previous level of function;Severity of impairments;Family/community support    SLP Home Exercise Plan  Memory lessons    Consulted and Agree with Plan of Care  Patient       Patient will benefit from skilled therapeutic intervention in order to improve the following deficits and impairments:   Cognitive communication deficit    Problem List Patient Active Problem List   Diagnosis Date Noted  . Closed fracture of upper end of humerus 04/25/2017  . Concussion with loss of consciousness 04/10/2017  . 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 Encounter for screening colonoscopy 03/16/2013  . Routine general medical examination at a health care facility 03/16/2013  . Right leg weakness 01/23/2013  . History of Guillain-Barre syndrome 01/23/2013  . Radial fracture 01/23/2013  . 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 UNSPECIFIED VITAMIN D DEFICIENCY  08/26/2008  . UNSPECIFIED ANEMIA 08/26/2008  . Recurrent depressive disorder, current episode moderate ([place]) 07/29/2008  . Fatigue due to depression 07/29/2008  . WEIGHT LOSS 07/29/2008   Leroy Sea, MS/CCC- SLP  Lou Miner 07/22/2017, 12:43 PM  [place] MAIN [facility] SERVICES [address] [place], [place], [address] Phone: [phone]   Fax:  [phone]   Name: JOOD RETANA MRN: [id number] Date of Birth: Jul 14, 1945

## 2017-07-22 NOTE — Therapy (Signed)
[place] MAIN [facility] SERVICES [address] [place], [place], [address] Phone: [phone]   Fax:  [phone]  Physical Therapy Treatment  Patient Details  Name: Holly [name] MRN: [id number] Date of Birth: 11-29-1945 Referring Provider: Kurtis Bushman, MD   Encounter Date: 07/22/2017  PT End of Session - 07/22/17 1025    Visit Number  22    Number of Visits  37    Date for PT Re-Evaluation  08/01/17    Authorization Type  no g codes, workers compensation, vestibular cert ends 4/33/29    PT Start Time  1018    PT Stop Time  1105    PT Time Calculation (min)  47 min    Equipment Utilized During Treatment  Gait belt    Activity Tolerance  Patient tolerated treatment well    Behavior During Therapy  [facility] for tasks assessed/performed       Past Medical History:  Diagnosis Date  . Anxiety   . Depression   . Guillain Barr syndrome ([place]) 2005  . Osteoporosis   . Squamous cell skin cancer 2011    Past Surgical History:  Procedure Laterality Date  . ABDOMINAL HYSTERECTOMY  1989  . APPENDECTOMY  1960  . BREAST SURGERY  1980   biopsy  . WRIST FRACTURE SURGERY Right 2015    There were no vitals filed for this visit.  Subjective Assessment - 07/22/17 1022    Subjective  Pt reports that her eyes feel heavy today after working. She spent a lot of time in front of the computer and it was mentally fatiguing. She is having some increased R shoulder soreness today and states that the discomfort woke her up last night. No specific questions today.     Pertinent History  Parts of history borrowed by MD. Patient reports on 10/13 she was going up a ramp and lost her footing, fell forward and onto right shoulder. Caused her to fracture nasal bone, proximal humerus. Pt states she bit through her bottom lip. Reports she had loss of consciousness for a short period. Head CT showed nasal fracture but no IC bleed. RUE is immobilized and being managed  conservatively. She complains of "heaviness" feeling in head and eyes. She is still having intermittent headaches. She is complaining of balance problems, dizziness, still feels weak and has trouble focusing. She complains of dizziness with quick head turns. Denies vertigo. No prior history of concussion. She had Guillan Barre in 2005 that causes shakiness in arms since then but otherwise has done well. She reports some mild RLE weakness since the [employer]. Denies any slurring of speech, difficulty, swallowing, or focal numbness/tingling/weakness since the concussion. ROS negative for red flags     Limitations  Reading    Diagnostic tests  plain films    Patient Stated Goals  to return to previous level of funcition.     Currently in Pain?  Yes    Pain Score  3     Pain Location  Shoulder    Pain Orientation  Right    Pain Descriptors / Indicators  Aching    Pain Type  Chronic pain    Pain Onset  More than a month ago    Pain Frequency  Intermittent         [facility] PT Assessment - 07/22/17 1032      Observation/Other Assessments   Activities of Balance Confidence Scale (ABC Scale)   89.5%    Dizziness Handicap  Inventory [facility])   4/100         TREATMENT  Ther-ex UBE x 6 minutes (3 forward/backwards) at start of session for warm-up during history; Supine and then standing AROM R shoulder flexion against gravitywith cane assist x 15; Supine and then standing AROM scaption against gravity with cane assist x 15; Standing AROM flexion with 2# weight x 10; Standing AROM scaption against gravity with scapular assist MWM x 10;  Manual Therapy PROMwith10sholdsat end range in all planesxmultiple bouts, mild pain at end rangein all planes; R shoulder posterior/inferior glides at end range flexion, grade II-III as tolerated, 30s/bout x 2 bouts; R shoulder posterior mobs at 90 flexion pushing through elbow, grade I-II as tolerated, 30s/bout x 2 bouts; R shoulder inferior mobs at  120 scaption, grade II-III as tolerated, 30s/bout x 3 bouts; Gentle GH distraction with PROM through scaption;  Neuromuscuar Re-education  Pt completed DHI and ABC (unbilled); Scored DHI (4/100) and ABC (89.5%) VOR x 1 horizontal with forward/retro ambulation x 35' each; mCTSIB in // bars, >30s with conditions 1-4, increased sway on foam with eyes closed but no LOB Reviewed HEP with patient for vestibular/balance training;                 PT Education - 07/22/17 1024    Education provided  Yes    Education Details  plan of care and discharge from vestibular episode of care, continuation of R shoulder therapy    Person(s) Educated  Patient    Methods  Explanation    Comprehension  Verbalized understanding       PT Short Term Goals - 07/22/17 1029      PT SHORT TERM GOAL #1   Title  Pt will be independent with HEP in order to improve R shoulder ROM and strength in order to decrease fall risk and improve function at home and work.    Baseline  HEP given     Time  2    Period  Weeks    Status  Deferred      PT SHORT TERM GOAL #2   Title  Patient will report a worst pain of 5/10 on VAS in R shoulder   to improve tolerance with ADLs and reduced symptoms with activities.     Baseline  12/11: 7/10, 06/27/17: 6/10, 07/04/17: 6-7/10 worst with IR stretch    Time  2    Period  Weeks    Status  Deferred      PT SHORT TERM GOAL #3   Title  Patient will improve R shoulder AROM flexion and abduction to 80 degrees to improve ability to don and doff clothes     Baseline  flexion: 72 degrees, abduction 54 degrees; 06/27/17: 135 flexion R, 160 L, 115 abduction R, 160 L, 68 ER R, 85 L, 60 IR R, 70 L in supine; 07/04/17: 142 flexion R, 160 L, 120 abduction R, 160 L, 72 ER R, 85 L, 63 IR R, 70 L in supine    Time  2    Period  Weeks    Status  Deferred        PT Long Term Goals - 07/22/17 1029      PT LONG TERM GOAL #1   Title  Pt will improve ABC by at least 13% in order to  demonstrate clinically significant improvement in balance confidence.     Baseline  04/15/17: 45%, 06/03/17: 59.7% initial goal achieved and advanced to an additional 13%,  07/04/17: 86.57%, 07/22/17: 89%    Time  8    Period  Weeks    Status  Achieved    Target Date  07/29/17      PT LONG TERM GOAL #2   Title  Pt will decrease DHI score by at least 18 points in order to demonstrate clinically significant reduction in disability     Baseline  04/15/17: 68/100; 06/03/17: 54/100; 07/04/17: 12/100, 07/22/17: 4/100    Time  8    Period  Weeks    Status  Achieved    Target Date  07/29/17      PT LONG TERM GOAL #3   Title  Pt will improve DGI to 24/24 in order to demonstrate improved ability to perform head turns with ambulation as well as safety with stairs    Baseline  04/15/17: 21/24; 06/03/17: 23/24; 07/04/17: 24/24; 07/22/17: 24/24    Time  8    Period  Weeks    Status  Achieved    Target Date  07/29/17      PT LONG TERM GOAL #4   Title  Patient will report a worst pain of 3/10 on VAS in R shoulder  to improve tolerance with ADLs and reduced symptoms with activities.     Baseline  12/11: 7/10 pain, 06/27/17: 6/10, 07/04/17: 6/10 with IR stretch    Time  6    Period  Weeks    Status  Deferred    Target Date  08/01/17      PT LONG TERM GOAL #5   Title  Patient will improve shoulder AROM to > 140 degrees of flexion, scaption, and abduction for improved ability to perform overhead activities    Baseline  12/11: flexion 72, abduction 54; 06/27/17: 135 flexion R, 160 L, 115 abduction R, 160 L, 68 ER R, 85 L, 60 IR R, 70 L in supine; 07/04/17: 142 flexion R, 160 L, 120 abduction R, 160 L, 72 ER R, 85 L, 63 IR R, 70 L in supine    Time  6    Period  Weeks    Status  Deferred    Target Date  08/01/17      PT LONG TERM GOAL #6   Title  Patient will decrease Quick DASH score by > 8 points (73.8%)  demonstrating reduced self-reported upper extremity disability.    Baseline  12/11: 81.8%; 07/04/17: 36.36%     Time  6    Period  Weeks    Status  Achieved    Target Date  07/05/17      PT LONG TERM GOAL #7   Title  Patient will decrease Quick DASH Work score by > 8 points demonstrating reduced self-reported upper extremity disability.    Baseline   12/11: 67%; 07/04/17: 36.36%    Time  6    Period  Weeks    Status  Achieved    Target Date  07/05/17      PT LONG TERM GOAL #8   Title  Patient will decrease Quick DASH score by > 16 points (below 20%)  demonstrating reduced self-reported upper extremity disability.    Baseline   12/11: 67%; 07/04/17: 36.36%,     Time  4    Period  Weeks    Status  Deferred    Target Date  08/01/17      PT LONG TERM GOAL  #9   TITLE  Pt will be able to take dishes out of her overhead  cupboard and fasten her bra in the back in order to return to full funciton at home    Baseline  07/04/17: unable    Time  4    Period  Weeks    Status  Deferred    Target Date  08/01/17      PT LONG TERM GOAL  #10   TITLE  Pt will improve R shoulder strength by to at least 4/5 for flexion, abduction, IR, and ER in order to improve function at home and work    Baseline  07/04/17: 4-/5 for flexion, abduction, ER, and IR    Time  4    Period  Weeks    Status  Deferred    Target Date  08/01/17            Plan - 07/22/17 1029    Clinical Impression Statement  Pt has met all of her goals for vestibular therapy at this time. She reports no further episodes of dizziness. DHI is 4/100 and ABC is 89%. DGI is 24/24. She is able to balance with eyes open/closed on foam for at least 30 seconds each. No dizziness reported with retro ambulation and VOR x 1 horizontal. Will discharge patient from vestibular therapy at this time. Pt is independent with her HEP and understands appropriate progressions. She continues to demonstrate deficits in RUE ROM and strength however this continues to improve with each session. Therapy will continue for RUE ROM and strengthening. Will update outcome  measures and goals for R shoulder therapy next week.     Rehab Potential  Excellent    Clinical Impairments Affecting Rehab Potential  History of GB syndrome, comordities of fall, (+) continued progression of symptoms, acuity of problem.     PT Frequency  2x / week    PT Duration  4 weeks    PT Treatment/Interventions  Aquatic Therapy;ADLs/Self Care Home Management;Canalith Repostioning;Gait training;Stair training;DME Instruction;Functional mobility training;Therapeutic activities;Balance training;Therapeutic exercise;Neuromuscular re-education;Patient/family education;Manual techniques;Vestibular;Energy conservation    PT Next Visit Plan  RUE strengthening and ROM    PT Home Exercise Plan  07/14/17: added standing shoulder flexion with 1# weight, standing canes for abduction; 07/07/17: added wall slides with lift off as well as clockwise/counter clockwise wall circles; 06/29/17: RUE: prayer table stretch, sidelying ER with weight, sidelying IR sleeper stretch, AROM flexion/abduction with mirror feedback, theraband rows in standing; Update vestibular 06/21/17: forward/retro ambulation with VOR x 1 horizontal, semitandem balance with horizontal head/body turns, semitandem/tandem balance with eyes closed,  Prior HEP during therapy: Standing horizontal/vertical smooth pursuit with conflicting background and marching, standing horizontal/vertical saccades with conflicting background and marching, standing balance with feet in staggered stance on carpet with eyes closed (progress to tandem), VOR x 1 horizontal in standing with conflicting backround and while marching, semitandem balance with horizontal head/body turns;    Consulted and Agree with Plan of Care  Patient       Patient will benefit from skilled therapeutic intervention in order to improve the following deficits and impairments:  Decreased activity tolerance, Decreased balance, Dizziness  Visit Diagnosis: Acute pain of right shoulder  Dizziness  and giddiness     Problem List Patient Active Problem List   Diagnosis Date Noted  . Closed fracture of upper end of humerus 04/25/2017  . Concussion with loss of consciousness 04/10/2017  . 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 Encounter for screening colonoscopy 03/16/2013  . Routine general medical examination at a health care facility 03/16/2013  . Right leg weakness 01/23/2013  .  History of Guillain-Barre syndrome 01/23/2013  . Radial fracture 01/23/2013  . 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 UNSPECIFIED VITAMIN D DEFICIENCY 08/26/2008  . UNSPECIFIED ANEMIA 08/26/2008  . Recurrent depressive disorder, current episode moderate ([place]) 07/29/2008  . Fatigue due to depression 07/29/2008  . WEIGHT LOSS 07/29/2008   Phillips Grout PT, DPT   Rashada Klontz 07/22/2017, 11:24 AM  [place] MAIN [facility] SERVICES [address] [place], [place], [address] Phone: [phone]   Fax:  [phone]  Name: JACOBY RITSEMA MRN: [id number] Date of Birth: 1945/12/09

## 2017-07-25 ENCOUNTER — Ambulatory Visit: Payer: PRIVATE HEALTH INSURANCE | Attending: Family Medicine | Admitting: Speech Pathology

## 2017-07-25 DIAGNOSIS — M25511 Pain in right shoulder: Secondary | ICD-10-CM | POA: Diagnosis present

## 2017-07-25 DIAGNOSIS — M25611 Stiffness of right shoulder, not elsewhere classified: Secondary | ICD-10-CM | POA: Insufficient documentation

## 2017-07-25 DIAGNOSIS — R41841 Cognitive communication deficit: Secondary | ICD-10-CM | POA: Insufficient documentation

## 2017-07-26 ENCOUNTER — Encounter: Payer: Self-pay | Admitting: Family Medicine

## 2017-07-26 ENCOUNTER — Encounter: Payer: Self-pay | Admitting: Speech Pathology

## 2017-07-26 ENCOUNTER — Ambulatory Visit (INDEPENDENT_AMBULATORY_CARE_PROVIDER_SITE_OTHER): Payer: Worker's Compensation | Admitting: Family Medicine

## 2017-07-26 DIAGNOSIS — S060X9D Concussion with loss of consciousness of unspecified duration, subsequent encounter: Secondary | ICD-10-CM

## 2017-07-26 NOTE — Patient Instructions (Addendum)
Continue the progression of work 2 hours at a time as we discussed. Finish the cognitive therapy. Continue the home exercises as well through to when I see you back. Follow up with me in 2 weeks for reevaluation.

## 2017-07-26 NOTE — Therapy (Signed)
[place] MAIN [facility] SERVICES [address] [place], [place], [address] Phone: [phone]   Fax:  [phone]  Speech Language Pathology Treatment  Patient Details  Name: Holly [name] MRN: [id number] Date of Birth: 04-Jan-1946 Referring Provider: Dene Gentry    Encounter Date: 07/25/2017  End of Session - 07/26/17 0834    Visit Number  17    Number of Visits  17    Date for SLP Re-Evaluation  08/11/17    SLP Start Time  81    SLP Stop Time   1655    SLP Time Calculation (min)  55 min    Activity Tolerance  Patient tolerated treatment well       Past Medical History:  Diagnosis Date  . Anxiety   . Depression   . Guillain Barr syndrome ([place]) 2005  . Osteoporosis   . Squamous cell skin cancer 2011    Past Surgical History:  Procedure Laterality Date  . ABDOMINAL HYSTERECTOMY  1989  . APPENDECTOMY  1960  . BREAST SURGERY  1980   biopsy  . WRIST FRACTURE SURGERY Right 2015    There were no vitals filed for this visit.  Subjective Assessment - 07/26/17 0834    Subjective  Patient reports that returning to work has been tiring, but that she is doing well.            ADULT SLP TREATMENT - 07/26/17 0001      General Information   Behavior/Cognition  Alert;Cooperative;Pleasant mood    HPI  Per MD: Patient is a 72 y.o. female here for concussion.  Patient reports on 10/13 she was going up a ramp and lost her footing, fell forward and onto right shoulder.  Caused her to fracture nasal bone, proximal humerus.  Reports she had loss of consciousness for a short period.  Had headache for 4-5 days with ~5/10 level of pain.  Balance problems, dizziness, still feels weak and has trouble focusing.  No nausea except from the percocet she took for fractures.  No prior history of concussion.  Feels things do not look 'quite straight' but denies wavy lines, loss of vision.  No weakness, numbness in extremities.  She had Guillan Barre  in 2005 that causes shakiness in arms since then but otherwise has done well.       Treatment Provided   Treatment provided  Cognitive-Linquistic      Pain Assessment   Pain Assessment  No/denies pain      Cognitive-Linquistic Treatment   Treatment focused on  Cognition;Patient/family/caregiver education    Skilled Treatment  COGNTION: Completed 1 simple Perplexor puzzles with min cues understand new reasoning concept and no cues to recall "rules" (strategies to complete these puzzles).  Patient able to verbalize information provided in clue and what it means in regard to puzzle solving grid with cues.  MEMORY: Patient reports that she is back at work, 4 hours a day (first day at new schedule).  She reports that she is recalling processes and her work is slower but accurate.  MEMORY: repeat 4-word lists, with 80% accuracy and 5-word lists with 60% accuracy; answer question RE: lists with no difficulty.  Recall 5 pictured people and (unrelated) associated object with 90% accuracy.  Error was faulty information coding. MEMORY AND MENTAL MANIPULATION: Repeat 3-word list in alphabetical order with 90% accuracy      Assessment / Recommendations / Plan   Plan  Continue with current plan of care  Progression Toward Goals   Progression toward goals  Progressing toward goals       SLP Education - 07/26/17 0834    Education provided  Yes    Education Details  use strategies to aid recall    Person(s) Educated  Patient    Methods  Explanation    Comprehension  Verbalized understanding         SLP Long Term Goals - 06/01/17 1243      SLP LONG TERM GOAL #1   Title  Patient will identify cognitive-communication barriers and participate in developing functional compensatory strategies.    Time  4    Period  Weeks    Status  Partially Met    Target Date  07/01/17      SLP LONG TERM GOAL #2   Title  Patient will complete complex attention, executive function, memory, and visual-spatial  activities with 80% accuracy.    Time  4    Period  Weeks    Status  Partially Met    Target Date  07/01/17      SLP LONG TERM GOAL #3   Title  Patient will demonstrate functional cognitive-communication skills for successful return to work.    Time  4    Period  Weeks    Status  Partially Met    Target Date  07/01/17      SLP LONG TERM GOAL #4   Title  Patient will generate grammatical, fluent, and cogent sentence to complete abstract/complex linguistic task with 80% accuracy.    Time  4    Period  Weeks    Status  Partially Met    Target Date  07/01/17       Plan - 07/26/17 0835    Clinical Impression Statement  The patient is able to complete simple cognitive tasks accurately at a slower pace.  The patient demonstrates adequate memory to complete more complex cognitive tasks and is exhibiting less anxiety as the complexity increases.  The patient demonstrates improved ability to verbalize reasoning during therapeutic tasks with overall improved performance and independence for these tasks.    Speech Therapy Frequency  2x / week    Duration  4 weeks    Treatment/Interventions  Cognitive reorganization;Internal/external aids;Compensatory strategies;SLP instruction and feedback;Patient/family education    Potential to Achieve Goals  Good    Potential Considerations  Ability to learn/carryover information;Co-morbidities;Cooperation/participation level;Medical prognosis;Previous level of function;Severity of impairments;Family/community support    Consulted and Agree with Plan of Care  Patient       Patient will benefit from skilled therapeutic intervention in order to improve the following deficits and impairments:   Cognitive communication deficit    Problem List Patient Active Problem List   Diagnosis Date Noted  . Closed fracture of upper end of humerus 04/25/2017  . Concussion with loss of consciousness 04/10/2017  . 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 Encounter for  screening colonoscopy 03/16/2013  . Routine general medical examination at a health care facility 03/16/2013  . Right leg weakness 01/23/2013  . History of Guillain-Barre syndrome 01/23/2013  . Radial fracture 01/23/2013  . 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 UNSPECIFIED VITAMIN D DEFICIENCY 08/26/2008  . UNSPECIFIED ANEMIA 08/26/2008  . Recurrent depressive disorder, current episode moderate ([place]) 07/29/2008  . Fatigue due to depression 07/29/2008  . WEIGHT LOSS 07/29/2008   Leroy Sea, MS/CCC- SLP  Lou Miner 07/26/2017, 8:36 AM  [place] MAIN [facility] SERVICES [place], [place], [address] Phone: [phone]   Fax:  [phone]   Name: Holly [name] MRN: [id number] Date of Birth: 04/15/1946

## 2017-07-27 ENCOUNTER — Ambulatory Visit: Payer: PRIVATE HEALTH INSURANCE

## 2017-07-27 ENCOUNTER — Ambulatory Visit: Payer: PRIVATE HEALTH INSURANCE | Admitting: Speech Pathology

## 2017-07-27 ENCOUNTER — Encounter: Payer: Self-pay | Admitting: Family Medicine

## 2017-07-27 DIAGNOSIS — M25511 Pain in right shoulder: Secondary | ICD-10-CM

## 2017-07-27 DIAGNOSIS — M25611 Stiffness of right shoulder, not elsewhere classified: Secondary | ICD-10-CM

## 2017-07-27 NOTE — Therapy (Signed)
[place] MAIN [facility] SERVICES [address] [place], [place], [address] Phone: [phone]   Fax:  [phone]  Physical Therapy Treatment  Patient Details  Name: Holly [name] MRN: [id number] Date of Birth: 30-Sep-1945 Referring Provider: Kurtis Bushman, MD   Encounter Date: 07/27/2017  PT End of Session - 07/27/17 1318    Visit Number  23    Number of Visits  37    Date for PT Re-Evaluation  08/01/17    Authorization Type  no g codes, workers compensation, vestibular cert ends 3/90/30    PT Start Time  1305    PT Stop Time  1350    PT Time Calculation (min)  45 min    Equipment Utilized During Treatment  Gait belt    Activity Tolerance  Patient tolerated treatment well    Behavior During Therapy  [facility] for tasks assessed/performed       Past Medical History:  Diagnosis Date  . Anxiety   . Depression   . Guillain Barr syndrome ([place]) 2005  . Osteoporosis   . Squamous cell skin cancer 2011    Past Surgical History:  Procedure Laterality Date  . ABDOMINAL HYSTERECTOMY  1989  . APPENDECTOMY  1960  . BREAST SURGERY  1980   biopsy  . WRIST FRACTURE SURGERY Right 2015    There were no vitals filed for this visit.  Subjective Assessment - 07/27/17 1315    Subjective  Pt states that she saw Dr. Barbaraann Barthel yesterday. He was in agreement to end her formal vestibular therapy. He encouraged her to only work on the computer for 30 minutes at a time due to fatigue. She is going to work for 4 hours/day the rest of this week, 6 hours/day next week, and hopefully 8 hours/day the following week. She has a follow-up appointment with Dr. Barbaraann Barthel 08/09/17 at 3:00. Pt has no specific questions or concerns at this time.     Pertinent History  Parts of history borrowed by MD. Patient reports on 10/13 she was going up a ramp and lost her footing, fell forward and onto right shoulder. Caused her to fracture nasal bone, proximal humerus. Pt states she bit through  her bottom lip. Reports she had loss of consciousness for a short period. Head CT showed nasal fracture but no IC bleed. RUE is immobilized and being managed conservatively. She complains of "heaviness" feeling in head and eyes. She is still having intermittent headaches. She is complaining of balance problems, dizziness, still feels weak and has trouble focusing. She complains of dizziness with quick head turns. Denies vertigo. No prior history of concussion. She had Guillan Barre in 2005 that causes shakiness in arms since then but otherwise has done well. She reports some mild RLE weakness since the [facility]. Denies any slurring of speech, difficulty, swallowing, or focal numbness/tingling/weakness since the concussion. ROS negative for red flags     Limitations  Reading    Diagnostic tests  plain films    Patient Stated Goals  to return to previous level of funcition.     Currently in Pain?  Yes    Pain Score  3     Pain Location  Shoulder    Pain Orientation  Right    Pain Descriptors / Indicators  Aching    Pain Type  Chronic pain    Pain Onset  More than a month ago    Pain Frequency  Intermittent    Aggravating Factors  movement, gets pain at night    Pain Relieving Factors  rest    Effect of Pain on Daily Activities  difficulty putting on bra, putting on seatbelt          TREATMENT  Ther-ex Supine R shoulder flexion with 3# dumbell 2 x 10; Supine R serratus punch 2 x 10 with manual resistance; L sidelying R shoulder ER 3# dumbell 2 x 10; Supine R shoulder D2 flexion with red tband 2 x 10; Supine R shoulder rhythmic stabilization at wrist 30s x 2;  Manual Therapy PROMwith10sholdsat end range in all planesxmultiple bouts, mild pain at end rangein all planes; R shoulder AP mobs at neutral and then at 90 flexion, grade II-III as tolerated, 30s/bout x 3 bouts; R shoulder posterior/inferior glides at end range flexion, grade II-III as tolerated, 30s/bout x 2  bouts; R shoulder AP mobs at 90 abduction and end range ER, grade II-III as tolerated, 20s/bout x 3 bouts; R shoulder inferior mobs at 90 scaption, grade II-III as tolerated, 30s/bout x 3 bouts; Gentle GH distraction with PROM through scaption;                    PT Education - 07/27/17 1318    Education provided  Yes    Education Details  exercise form/technique and plan of care    Person(s) Educated  Patient    Methods  Explanation    Comprehension  Verbalized understanding       PT Short Term Goals - 07/22/17 1029      PT SHORT TERM GOAL #1   Title  Pt will be independent with HEP in order to improve R shoulder ROM and strength in order to decrease fall risk and improve function at home and work.    Baseline  HEP given     Time  2    Period  Weeks    Status  Deferred      PT SHORT TERM GOAL #2   Title  Patient will report a worst pain of 5/10 on VAS in R shoulder   to improve tolerance with ADLs and reduced symptoms with activities.     Baseline  12/11: 7/10, 06/27/17: 6/10, 07/04/17: 6-7/10 worst with IR stretch    Time  2    Period  Weeks    Status  Deferred      PT SHORT TERM GOAL #3   Title  Patient will improve R shoulder AROM flexion and abduction to 80 degrees to improve ability to don and doff clothes     Baseline  flexion: 72 degrees, abduction 54 degrees; 06/27/17: 135 flexion R, 160 L, 115 abduction R, 160 L, 68 ER R, 85 L, 60 IR R, 70 L in supine; 07/04/17: 142 flexion R, 160 L, 120 abduction R, 160 L, 72 ER R, 85 L, 63 IR R, 70 L in supine    Time  2    Period  Weeks    Status  Deferred        PT Long Term Goals - 07/22/17 1029      PT LONG TERM GOAL #1   Title  Pt will improve ABC by at least 13% in order to demonstrate clinically significant improvement in balance confidence.     Baseline  04/15/17: 45%, 06/03/17: 59.7% initial goal achieved and advanced to an additional 13%, 07/04/17: 86.57%, 07/22/17: 89%    Time  8    Period  Weeks     Status  Achieved    Target Date  07/29/17      PT LONG TERM GOAL #2   Title  Pt will decrease DHI score by at least 18 points in order to demonstrate clinically significant reduction in disability     Baseline  04/15/17: 68/100; 06/03/17: 54/100; 07/04/17: 12/100, 07/22/17: 4/100    Time  8    Period  Weeks    Status  Achieved    Target Date  07/29/17      PT LONG TERM GOAL #3   Title  Pt will improve DGI to 24/24 in order to demonstrate improved ability to perform head turns with ambulation as well as safety with stairs    Baseline  04/15/17: 21/24; 06/03/17: 23/24; 07/04/17: 24/24; 07/22/17: 24/24    Time  8    Period  Weeks    Status  Achieved    Target Date  07/29/17      PT LONG TERM GOAL #4   Title  Patient will report a worst pain of 3/10 on VAS in R shoulder  to improve tolerance with ADLs and reduced symptoms with activities.     Baseline  12/11: 7/10 pain, 06/27/17: 6/10, 07/04/17: 6/10 with IR stretch    Time  6    Period  Weeks    Status  Deferred    Target Date  08/01/17      PT LONG TERM GOAL #5   Title  Patient will improve shoulder AROM to > 140 degrees of flexion, scaption, and abduction for improved ability to perform overhead activities    Baseline  12/11: flexion 72, abduction 54; 06/27/17: 135 flexion R, 160 L, 115 abduction R, 160 L, 68 ER R, 85 L, 60 IR R, 70 L in supine; 07/04/17: 142 flexion R, 160 L, 120 abduction R, 160 L, 72 ER R, 85 L, 63 IR R, 70 L in supine    Time  6    Period  Weeks    Status  Deferred    Target Date  08/01/17      PT LONG TERM GOAL #6   Title  Patient will decrease Quick DASH score by > 8 points (73.8%)  demonstrating reduced self-reported upper extremity disability.    Baseline  12/11: 81.8%; 07/04/17: 36.36%    Time  6    Period  Weeks    Status  Achieved    Target Date  07/05/17      PT LONG TERM GOAL #7   Title  Patient will decrease Quick DASH Work score by > 8 points demonstrating reduced self-reported upper extremity disability.     Baseline   12/11: 67%; 07/04/17: 36.36%    Time  6    Period  Weeks    Status  Achieved    Target Date  07/05/17      PT LONG TERM GOAL #8   Title  Patient will decrease Quick DASH score by > 16 points (below 20%)  demonstrating reduced self-reported upper extremity disability.    Baseline   12/11: 67%; 07/04/17: 36.36%,     Time  4    Period  Weeks    Status  Deferred    Target Date  08/01/17      PT LONG TERM GOAL  #9   TITLE  Pt will be able to take dishes out of her overhead cupboard and fasten her bra in the back in order to return to full funciton at home    Baseline  07/04/17: unable    Time  4    Period  Weeks    Status  Deferred    Target Date  08/01/17      PT LONG TERM GOAL  #10   TITLE  Pt will improve R shoulder strength by to at least 4/5 for flexion, abduction, IR, and ER in order to improve function at home and work    Baseline  07/04/17: 4-/5 for flexion, abduction, ER, and IR    Time  4    Period  Weeks    Status  Deferred    Target Date  08/01/17            Plan - 07/27/17 1319    Clinical Impression Statement  Pt continues to demonstrate deficits in active and passive R shoulder flexion, abduction, and internal rotation. She is able to progress resistance today with hand weights for standing strengthening exercises. Persistent pain at available end range with manual therapy to R shoulder. Pt encouraged to continue HEP and follow-up as scheduled. Will repeat outcome measures at next appointment about recertification vs discharge.     Rehab Potential  Excellent    Clinical Impairments Affecting Rehab Potential  History of GB syndrome, comordities of fall, (+) continued progression of symptoms, acuity of problem.     PT Frequency  2x / week    PT Duration  4 weeks    PT Treatment/Interventions  Aquatic Therapy;ADLs/Self Care Home Management;Canalith Repostioning;Gait training;Stair training;DME Instruction;Functional mobility training;Therapeutic  activities;Balance training;Therapeutic exercise;Neuromuscular re-education;Patient/family education;Manual techniques;Vestibular;Energy conservation    PT Next Visit Plan  Repeat outcome measures for R shoulder, recertification vs discharge    PT Home Exercise Plan  07/14/17: added standing shoulder flexion with 1# weight, standing canes for abduction; 07/07/17: added wall slides with lift off as well as clockwise/counter clockwise wall circles; 06/29/17: RUE: prayer table stretch, sidelying ER with weight, sidelying IR sleeper stretch, AROM flexion/abduction with mirror feedback, theraband rows in standing; Update vestibular 06/21/17: forward/retro ambulation with VOR x 1 horizontal, semitandem balance with horizontal head/body turns, semitandem/tandem balance with eyes closed,  Prior HEP during therapy: Standing horizontal/vertical smooth pursuit with conflicting background and marching, standing horizontal/vertical saccades with conflicting background and marching, standing balance with feet in staggered stance on carpet with eyes closed (progress to tandem), VOR x 1 horizontal in standing with conflicting backround and while marching, semitandem balance with horizontal head/body turns;    Consulted and Agree with Plan of Care  Patient       Patient will benefit from skilled therapeutic intervention in order to improve the following deficits and impairments:  Decreased activity tolerance, Decreased balance, Dizziness  Visit Diagnosis: Acute pain of right shoulder  Stiffness of right shoulder, not elsewhere classified     Problem List Patient Active Problem List   Diagnosis Date Noted  . Closed fracture of upper end of humerus 04/25/2017  . Concussion with loss of consciousness 04/10/2017  . 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 Encounter for screening colonoscopy 03/16/2013  . Routine general medical examination at a health care facility 03/16/2013  . Right leg weakness 01/23/2013  . History of  Guillain-Barre syndrome 01/23/2013  . Radial fracture 01/23/2013  . 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 UNSPECIFIED VITAMIN D DEFICIENCY 08/26/2008  . UNSPECIFIED ANEMIA 08/26/2008  . Recurrent depressive disorder, current episode moderate ([place]) 07/29/2008  . Fatigue due to depression 07/29/2008  . WEIGHT LOSS 07/29/2008   Lyndel Safe Stefana Lodico PT, DPT   Talina Pleitez 07/28/2017, 5:24 PM  [place] MAIN [facility] SERVICES [address] [place], [place], [address] Phone: [phone]   Fax:  [phone]  Name: Holly [name] MRN: [id number] Date of Birth: 01/29/1946

## 2017-07-27 NOTE — Progress Notes (Signed)
PCP: Crecencio Mc, MD  Subjective:   HPI: Patient is a 72 y.o. female here for concussion.  10/25: Patient reports on 10/13 she was going up a ramp and lost her footing, fell forward and onto right shoulder. Caused her to fracture nasal bone, proximal humerus. Reports she had loss of consciousness for a short period. Had headache for 4-5 days with ~5/10 level of pain. Balance problems, dizziness, still feels weak and has trouble focusing. No nausea except from the percocet she took for fractures. No prior history of concussion. Feels things do not look 'quite straight' but denies wavy lines, loss of vision. No weakness, numbness in extremities. She had Guillan Barre in 2005 that causes shakiness in arms since then but otherwise has done well. SCAT 3 18/22 symptoms with score 82/132.  11/21: Patient reports she is doing well. Has done 2 visits of cognitive therapy and 4 visits of physical therapy.   No headache currently - last one two days ago. Still slow with concentration, difficulty expressing herself but improved. Has some dizziness, difficulty focusing at times. Waking up at night. No numbness or weakness. SCAT3 11/22 symptoms with score 33/132.  12/5: Patient reports she is improved since last visit. Has been to 5 visits of cognitive therapy, 6 visits of PT/vestibular rehab. Doing home exercises also. Finds the dizziness is the most frustrating but she has trouble with multitasking, concentrating, and gets tired faster. SCAT 3 9/22 symptoms, severity 16/132.  06/15/17: Patient reports headaches improving - last one 3 days ago. Not sleeping great. Feels something behind her eyes - heaviness, blurriness that comes and goes. Difficulty multitasking. Doing cognitive and PT/vestibular rehab but had about 1 week off of these due to holiday. SCAT3 9/22 symptoms, severity 32/132.  1/29: Patient reports she's doing very well. Excellent progress with vestibular therapy -  great report from them, balance improved. She states she walked for 45 minutes the other day and felt an extremely slight 'feeling' behind her eyes. Last headache was weeks ago now. No nausea.   Rare dizziness. SCAT3 0 symptoms now.  2/12: Patient reports she is doing well. She went to work for 2 hour days each day last week, 4 hours the past 2 days. No headaches. She states after the first day she felt stressed and overwhelmed, had to rest but has done well since. Noticed if she's on computer for a while she gets a 'feeling' behind her eyes. Feels better when she switches activities. Driving without any problems. Does notice she asks coworkers to repeat things more. Not taking any medicines.  Past Medical History:  Diagnosis Date  . Anxiety   . Depression   . Guillain Barr syndrome ([place]) 2005  . Osteoporosis   . Squamous cell skin cancer 2011    Current Outpatient Medications on File Prior to Visit  Medication Sig Dispense Refill  . ARIPiprazole (ABILIFY) 5 MG tablet Take 2.5 mg by mouth daily.    . Calcium Carbonate-Vitamin D (CALCIUM 600+D HIGH POTENCY PO) Take 1 tablet by mouth daily.    . Cholecalciferol (VITAMIN D3) 2000 UNITS CHEW Chew 1 capsule by mouth daily. Reported on 08/29/2015    . clorazepate (TRANXENE) 7.5 MG tablet Take 7.5 mg by mouth 2 (two) times daily.     Marland Kitchen FLUoxetine (PROZAC) 20 MG tablet Take 20 mg by mouth daily.     . Levomilnacipran HCl ER (FETZIMA) 20 MG CP24 Take 20 mg by mouth.    Marland Kitchen LORazepam (ATIVAN) 1 MG  tablet Take 1 mg by mouth at bedtime.  3  . oxyCODONE-acetaminophen (ROXICET) 5-325 MG tablet Take 1-2 tablets by mouth every 6 (six) hours as needed. (Patient not taking: Reported on 04/15/2017) 12 tablet 0  . tamsulosin (FLOMAX) 0.4 MG CAPS capsule Take 0.4 mg by mouth daily.  11  . temazepam (RESTORIL) 30 MG capsule Take 30 mg by mouth at bedtime as needed for sleep.    . traMADol (ULTRAM) 50 MG tablet Take by mouth every 6 (six) hours as  needed.     No current facility-administered medications on file prior to visit.     Past Surgical History:  Procedure Laterality Date  . ABDOMINAL HYSTERECTOMY  1989  . APPENDECTOMY  1960  . BREAST SURGERY  1980   biopsy  . WRIST FRACTURE SURGERY Right 2015    Allergies  Allergen Reactions  . Oxycodone-Acetaminophen Nausea And Vomiting  . Cephalexin     Other reaction(s): HIVES    Social History   Socioeconomic History  . Marital status: Widowed    Spouse name: Not on file  . Number of children: Not on file  . Years of education: Not on file  . Highest education level: Not on file  Social Needs  . Financial resource strain: Not on file  . Food insecurity - worry: Not on file  . Food insecurity - inability: Not on file  . Transportation needs - medical: Not on file  . Transportation needs - non-medical: Not on file  Occupational History  . Not on file  Tobacco Use  . Smoking status: Former Smoker    Packs/day: 0.20    Types: Cigarettes    Last attempt to quit: 06/14/1977    Years since quitting: 40.1  . Smokeless tobacco: Never Used  Substance and Sexual Activity  . Alcohol use: Yes    Alcohol/week: 0.0 oz    Comment: social (about once a month)  . Drug use: No  . Sexual activity: No  Other Topics Concern  . Not on file  Social History Narrative  . Not on file    Family History  Problem Relation Age of Onset  . Cancer Mother   . Mental illness Sister   . Bipolar disorder Sister   . Depression Brother   . Mental illness Brother   . Parkinson's disease Brother     BP (!) 151/102   Pulse 63   Ht 5\' 6"  (1.676 m)   Wt 141 lb (64 kg)   BMI 22.76 kg/m   Review of Systems: See HPI above.     Objective:  Physical Exam:  Gen: NAD, comfortable in exam room.  Neuro: Balance 0 errors double leg, 1 tandem, unsteady single leg. Horizontal, vertical saccades, fixed gaze with head rotation 30 trials all without symptoms.   Assessment & Plan:  1.  Concussion with loss of consciousness - patient is doing well with her slow return to work.  I encouraged her to at most use the computer 30 minutes at a time and switch between different activities at work if possible.  She has about 1 more week of cognitive therapy - encouraged to finish this.  Continue home vestibular and oculomotor exercises.  Increase work by 2 hours per week.  Follow up in 2 weeks for reevaluation.  Total visit time 20 minutes - > 50% of which spent on counseling and answering questions.

## 2017-07-27 NOTE — Assessment & Plan Note (Signed)
patient is doing well with her slow return to work.  I encouraged her to at most use the computer 30 minutes at a time and switch between different activities at work if possible.  She has about 1 more week of cognitive therapy - encouraged to finish this.  Continue home vestibular and oculomotor exercises.  Increase work by 2 hours per week.  Follow up in 2 weeks for reevaluation.  Total visit time 20 minutes - > 50% of which spent on counseling and answering questions.

## 2017-07-29 ENCOUNTER — Ambulatory Visit: Payer: PRIVATE HEALTH INSURANCE

## 2017-07-29 DIAGNOSIS — R41841 Cognitive communication deficit: Secondary | ICD-10-CM | POA: Diagnosis not present

## 2017-07-29 DIAGNOSIS — M25511 Pain in right shoulder: Secondary | ICD-10-CM

## 2017-07-29 DIAGNOSIS — M25611 Stiffness of right shoulder, not elsewhere classified: Secondary | ICD-10-CM

## 2017-07-29 NOTE — Therapy (Signed)
[place] MAIN [facility] SERVICES [address] [place], [place], [address] Phone: [phone]   Fax:  [phone]  Physical Therapy Treatment  Patient Details  Name: Holly [name] MRN: [id number] Date of Birth: June 11, 1946 Referring Provider: Kurtis Bushman, MD   Encounter Date: 07/29/2017  PT End of Session - 07/29/17 1725    Visit Number  24    Number of Visits  49    Date for PT Re-Evaluation  09/09/17    Authorization Type  no g codes, workers compensation,     PT Start Time  1330    PT Stop Time  1430    PT Time Calculation (min)  60 min    Equipment Utilized During Treatment  Gait belt    Activity Tolerance  Patient tolerated treatment well    Behavior During Therapy  [facility] for tasks assessed/performed       Past Medical History:  Diagnosis Date  . Anxiety   . Depression   . Guillain Barr syndrome ([place]) 2005  . Osteoporosis   . Squamous cell skin cancer 2011    Past Surgical History:  Procedure Laterality Date  . ABDOMINAL HYSTERECTOMY  1989  . APPENDECTOMY  1960  . BREAST SURGERY  1980   biopsy  . WRIST FRACTURE SURGERY Right 2015    There were no vitals filed for this visit.  Subjective Assessment - 07/29/17 1334    Subjective  Pt is doing well today. She continues to report 2-3/10 R shoulder pain with movement. Overall she feels like she is improving but still struggles with activies overhead and behind her back. She is performing HEP. No specific questions or concerns at this time.     Pertinent History  Parts of history borrowed by MD. Patient reports on 10/13 she was going up a ramp and lost her footing, fell forward and onto right shoulder. Caused her to fracture nasal bone, proximal humerus. Pt states she bit through her bottom lip. Reports she had loss of consciousness for a short period. Head CT showed nasal fracture but no IC bleed. RUE is immobilized and being managed conservatively. She complains of "heaviness"  feeling in head and eyes. She is still having intermittent headaches. She is complaining of balance problems, dizziness, still feels weak and has trouble focusing. She complains of dizziness with quick head turns. Denies vertigo. No prior history of concussion. She had Guillan Barre in 2005 that causes shakiness in arms since then but otherwise has done well. She reports some mild RLE weakness since the [facility]. Denies any slurring of speech, difficulty, swallowing, or focal numbness/tingling/weakness since the concussion. ROS negative for red flags     Limitations  Reading    Diagnostic tests  plain films    Patient Stated Goals  to return to previous level of funcition.     Currently in Pain?  Yes    Pain Score  3     Pain Location  Shoulder    Pain Orientation  Right    Pain Descriptors / Indicators  Aching    Pain Type  Chronic pain    Pain Onset  More than a month ago    Pain Frequency  Intermittent    Multiple Pain Sites  No         [facility] PT Assessment - 07/29/17 1723      Observation/Other Assessments   Quick DASH   43.2%  TREATMENT  Ther-ex L sidelying R shoulder ER 3# dumbell 2 x 10; Standing R shoulder flexion 3# dumbell 2 x 10; Standing R shoulder abduction 3# dumbell 2 x 10; Lifestyle center machine rows plate 2, 2 x 10; Lifestyle center machine lat pull downs plate 2, 2 x 10; Information and education about joining a gym for continued exercise following therapy;  Manual Therapy Scored quick DASH and reviewed with patient; Reviewed and updated goals; PROMwith10sholdsat end range in all planesxmultiple bouts, mild pain at end rangein all planes; R shoulder AP mobs at neutraland then at 90 flexion, grade II-III as tolerated, 30s/bout x 3 bouts; R shoulder posterior/inferior glides at end range flexion, grade II-III as tolerated, 30s/bout x 2 bouts; R shoulder inferior mobs at 90 scaption, grade II-III as tolerated, 30s/bout x 3  bouts; Gentle GH distraction with PROM through scaption; R clavicle on acromion inferior mobs, grade III, 30s/bout x 3 bouts; L sidelying R scapular MWM in scaption 2 x 5 with gradually improving ROM; ROM measures obtained: 155 flexion R, 160 L, 117 abduction R, 160 L, 80 ER R, 85 L, 70 IR R, 70 L in supine               PT Education - 07/29/17 1725    Education provided  Yes    Education Details  goals and plan of care, HEP and community-based exercise program    Person(s) Educated  Patient    Methods  Explanation    Comprehension  Verbalized understanding       PT Short Term Goals - 07/29/17 1353      PT SHORT TERM GOAL #1   Title  Pt will be independent with HEP in order to improve R shoulder ROM and strength in order to decrease fall risk and improve function at home and work.    Baseline  HEP given     Time  2    Period  Weeks    Status  Achieved    Target Date  06/07/18      PT SHORT TERM GOAL #2   Title  Patient will report a worst pain of 5/10 on VAS in R shoulder   to improve tolerance with ADLs and reduced symptoms with activities.     Baseline  12/11: 7/10, 06/27/17: 6/10, 07/04/17: 6-7/10 worst with IR stretch, 07/29/17: 5/10    Time  2    Period  Weeks    Status  Achieved    Target Date  07/18/17      PT SHORT TERM GOAL #3   Title  Patient will improve R shoulder AROM flexion and abduction to 80 degrees to improve ability to don and doff clothes     Baseline  flexion: 72 degrees, abduction 54 degrees; 06/27/17: 135 flexion R, 160 L, 115 abduction R, 160 L, 68 ER R, 85 L, 60 IR R, 70 L in supine;     Time  2    Period  Weeks    Status  Achieved    Target Date  06/07/17        PT Long Term Goals - 07/29/17 1353      PT LONG TERM GOAL #1   Title  Pt will improve ABC by at least 13% in order to demonstrate clinically significant improvement in balance confidence.     Baseline  04/15/17: 45%, 06/03/17: 59.7% initial goal achieved and advanced to an  additional 13%, 07/04/17: 86.57%, 07/22/17: 89%    Time  8    Period  Weeks    Status  Achieved      PT LONG TERM GOAL #2   Title  Pt will decrease DHI score by at least 18 points in order to demonstrate clinically significant reduction in disability     Baseline  04/15/17: 68/100; 06/03/17: 54/100; 07/04/17: 12/100, 07/22/17: 4/100    Time  8    Period  Weeks    Status  Achieved      PT LONG TERM GOAL #3   Title  Pt will improve DGI to 24/24 in order to demonstrate improved ability to perform head turns with ambulation as well as safety with stairs    Baseline  04/15/17: 21/24; 06/03/17: 23/24; 07/04/17: 24/24; 07/22/17: 24/24    Time  8    Period  Weeks    Status  Achieved      PT LONG TERM GOAL #4   Title  Patient will report a worst pain of 3/10 on VAS in R shoulder  to improve tolerance with ADLs and reduced symptoms with activities.     Baseline  12/11: 7/10 pain, 06/27/17: 6/10, 07/04/17: 6/10 with IR stretch; 07/29/17: 5/10;    Time  6    Period  Weeks    Status  On-going    Target Date  09/09/17      PT LONG TERM GOAL #5   Title  Patient will improve shoulder AROM to > 140 degrees of flexion, scaption, and abduction for improved ability to perform overhead activities    Baseline  12/11: flexion 72, abduction 54; 06/27/17: 135 flexion R, 160 L, 115 abduction R, 160 L, 68 ER R, 85 L, 60 IR R, 70 L in supine; 07/04/17: 142 flexion R, 160 L, 120 abduction R, 160 L, 72 ER R, 85 L, 63 IR R, 70 L in supine; 07/29/17: 155 flexion R, 160 L, 117 abduction R, 160 L, 80 ER R, 85 L, 70 IR R, 70 L in supine    Time  6    Period  Weeks    Status  On-going    Target Date  09/09/17      Additional Long Term Goals   Additional Long Term Goals  Yes      PT LONG TERM GOAL #6   Title  Patient will decrease Quick DASH score by > 8 points (73.8%)  demonstrating reduced self-reported upper extremity disability.    Baseline  12/11: 81.8%; 07/04/17: 36.36%    Time  6    Period  Weeks    Status  Achieved     Target Date  07/05/17      PT LONG TERM GOAL #7   Title  Patient will decrease Quick DASH Work score by > 16 points (below 20%) demonstrating reduced self-reported upper extremity disability.    Baseline   12/11: 67%; 07/04/17: 36.36%; 07/29/17: 43.2%    Time  6    Period  Weeks    Status  On-going    Target Date  09/09/17      PT LONG TERM GOAL #8   Title  Pt will be able to take dishes out of her overhead cupboard and fasten her bra in the back in order to return to full funciton at home    Baseline  07/04/17: unable; 07/29/17: unable    Time  4    Period  Weeks    Status  On-going    Target Date  09/09/17  PT LONG TERM GOAL  #9   TITLE  Pt will improve R shoulder strength by to at least 4/5 for flexion, abduction, IR, and ER in order to improve function at home and work    Baseline  07/04/17: 4-/5 for flexion, abduction, ER, and IR; 07/29/17: 4-/5 for flexion and abduction, 4/5 for IR and ER;    Time  6    Period  Weeks    Status  Partially Met    Target Date  09/09/17      PT LONG TERM GOAL  #10   TITLE  --    Baseline  --    Time  --    Period  --    Status  --            Plan - 07/29/17 1726    Clinical Impression Statement  Pt continues to make forward progress with her R shoulder weakness and ROM. Her R shoulder flexion, IR, and ER are close to symmetrical to her L side however her abduction and functional IR remain limited. R shoulder ER/IR strength has improved to 4/5 but she remains weak (4-/5) in R shoulder flexion and abduction. QuickDASH continues to demonstrate significant disability with pt scoring 43.2% today compared to 36.4% when it was last checked in January. Her resting pain is mostly completely resolved but reports average 3/10 R shoulder pain with overhead movements at up to 5/10 pain with most aggravating movements (reaching behind her back). She would benefit from additional skilled PT services to continue addressing her remaining deficits and return  to full function at home, work, and with leisure activities.     Clinical Presentation  Evolving    Clinical Decision Making  Moderate    Rehab Potential  Excellent    Clinical Impairments Affecting Rehab Potential  History of GB syndrome, comordities of fall, (+) continued progression of symptoms, acuity of problem.     PT Frequency  2x / week    PT Duration  6 weeks    PT Treatment/Interventions  Aquatic Therapy;ADLs/Self Care Home Management;Canalith Repostioning;Gait training;Stair training;DME Instruction;Functional mobility training;Therapeutic activities;Balance training;Therapeutic exercise;Neuromuscular re-education;Patient/family education;Manual techniques;Vestibular;Energy conservation    PT Next Visit Plan  R shoulder ROM and strengthening, progression to community-based exercise program    PT Home Exercise Plan  07/14/17: added standing shoulder flexion with 1# weight, standing canes for abduction; 07/07/17: added wall slides with lift off as well as clockwise/counter clockwise wall circles; 06/29/17: RUE: prayer table stretch, sidelying ER with weight, sidelying IR sleeper stretch, AROM flexion/abduction with mirror feedback, theraband rows in standing; Update vestibular 06/21/17: forward/retro ambulation with VOR x 1 horizontal, semitandem balance with horizontal head/body turns, semitandem/tandem balance with eyes closed,  Prior HEP during therapy: Standing horizontal/vertical smooth pursuit with conflicting background and marching, standing horizontal/vertical saccades with conflicting background and marching, standing balance with feet in staggered stance on carpet with eyes closed (progress to tandem), VOR x 1 horizontal in standing with conflicting backround and while marching, semitandem balance with horizontal head/body turns;    Consulted and Agree with Plan of Care  Patient       Patient will benefit from skilled therapeutic intervention in order to improve the following deficits  and impairments:  Decreased activity tolerance, Decreased balance, Dizziness  Visit Diagnosis: Acute pain of right shoulder - Plan: PT plan of care cert/re-cert  Stiffness of right shoulder, not elsewhere classified - Plan: PT plan of care cert/re-cert     Problem List Patient Active  Problem List   Diagnosis Date Noted  . Closed fracture of upper end of humerus 04/25/2017  . Concussion with loss of consciousness 04/10/2017  . 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 Encounter for screening colonoscopy 03/16/2013  . Routine general medical examination at a health care facility 03/16/2013  . Right leg weakness 01/23/2013  . History of Guillain-Barre syndrome 01/23/2013  . Radial fracture 01/23/2013  . 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 UNSPECIFIED VITAMIN D DEFICIENCY 08/26/2008  . UNSPECIFIED ANEMIA 08/26/2008  . Recurrent depressive disorder, current episode moderate ([place]) 07/29/2008  . Fatigue due to depression 07/29/2008  . WEIGHT LOSS 07/29/2008   Phillips Grout PT, DPT   Huprich,Jason 07/29/2017, 9:54 PM  [place] MAIN [facility] SERVICES [address] [place], [place], [address] Phone: [phone]   Fax:  [phone]  Name: Holly [name] MRN: [id number] Date of Birth: [birth year]

## 2017-08-04 ENCOUNTER — Ambulatory Visit: Payer: PRIVATE HEALTH INSURANCE | Admitting: Speech Pathology

## 2017-08-09 ENCOUNTER — Encounter: Payer: Self-pay | Admitting: Family Medicine

## 2017-08-09 ENCOUNTER — Ambulatory Visit (INDEPENDENT_AMBULATORY_CARE_PROVIDER_SITE_OTHER): Payer: PRIVATE HEALTH INSURANCE | Admitting: Family Medicine

## 2017-08-09 DIAGNOSIS — S060X9D Concussion with loss of consciousness of unspecified duration, subsequent encounter: Secondary | ICD-10-CM

## 2017-08-09 NOTE — Patient Instructions (Signed)
Continue the home exercises. Follow up with me in 3 weeks for reevaluation.

## 2017-08-09 NOTE — Progress Notes (Signed)
PCP: Crecencio Mc, MD  Subjective:   HPI: Patient is a 72 y.o. female here for concussion.  10/25: Patient reports on 10/13 she was going up a ramp and lost her footing, fell forward and onto right shoulder. Caused her to fracture nasal bone, proximal humerus. Reports she had loss of consciousness for a short period. Had headache for 4-5 days with ~5/10 level of pain. Balance problems, dizziness, still feels weak and has trouble focusing. No nausea except from the percocet she took for fractures. No prior history of concussion. Feels things do not look 'quite straight' but denies wavy lines, loss of vision. No weakness, numbness in extremities. She had Guillan Barre in 2005 that causes shakiness in arms since then but otherwise has done well. SCAT 3 18/22 symptoms with score 82/132.  11/21: Patient reports she is doing well. Has done 2 visits of cognitive therapy and 4 visits of physical therapy.   No headache currently - last one two days ago. Still slow with concentration, difficulty expressing herself but improved. Has some dizziness, difficulty focusing at times. Waking up at night. No numbness or weakness. SCAT3 11/22 symptoms with score 33/132.  12/5: Patient reports she is improved since last visit. Has been to 5 visits of cognitive therapy, 6 visits of PT/vestibular rehab. Doing home exercises also. Finds the dizziness is the most frustrating but she has trouble with multitasking, concentrating, and gets tired faster. SCAT 3 9/22 symptoms, severity 16/132.  06/15/17: Patient reports headaches improving - last one 3 days ago. Not sleeping great. Feels something behind her eyes - heaviness, blurriness that comes and goes. Difficulty multitasking. Doing cognitive and PT/vestibular rehab but had about 1 week off of these due to holiday. SCAT3 9/22 symptoms, severity 32/132.  1/29: Patient reports she's doing very well. Excellent progress with vestibular therapy -  great report from them, balance improved. She states she walked for 45 minutes the other day and felt an extremely slight 'feeling' behind her eyes. Last headache was weeks ago now. No nausea.   Rare dizziness. SCAT3 0 symptoms now.  2/12: Patient reports she is doing well. She went to work for 2 hour days each day last week, 4 hours the past 2 days. No headaches. She states after the first day she felt stressed and overwhelmed, had to rest but has done well since. Noticed if she's on computer for a while she gets a 'feeling' behind her eyes. Feels better when she switches activities. Driving without any problems. Does notice she asks coworkers to repeat things more. Not taking any medicines.  2/26: Patient reports she's overall doing well. She did have increased fatigue on first day of 6 hour shifts, a couple days where she had a headache as well. SCAT symptom score 5/22, severity 7/132  Past Medical History:  Diagnosis Date  . Anxiety   . Depression   . Guillain Barr syndrome ([place]) 2005  . Osteoporosis   . Squamous cell skin cancer 2011    Current Outpatient Medications on File Prior to Visit  Medication Sig Dispense Refill  . ARIPiprazole (ABILIFY) 5 MG tablet Take 2.5 mg by mouth daily.    . Calcium Carbonate-Vitamin D (CALCIUM 600+D HIGH POTENCY PO) Take 1 tablet by mouth daily.    . Cholecalciferol (VITAMIN D3) 2000 UNITS CHEW Chew 1 capsule by mouth daily. Reported on 08/29/2015    . clorazepate (TRANXENE) 7.5 MG tablet Take 7.5 mg by mouth 2 (two) times daily.     Marland Kitchen  FLUoxetine (PROZAC) 20 MG tablet Take 20 mg by mouth daily.     . Levomilnacipran HCl ER (FETZIMA) 20 MG CP24 Take 20 mg by mouth.    Marland Kitchen LORazepam (ATIVAN) 1 MG tablet Take 1 mg by mouth at bedtime.  3  . oxyCODONE-acetaminophen (ROXICET) 5-325 MG tablet Take 1-2 tablets by mouth every 6 (six) hours as needed. (Patient not taking: Reported on 04/15/2017) 12 tablet 0  . tamsulosin (FLOMAX) 0.4 MG CAPS  capsule Take 0.4 mg by mouth daily.  11  . temazepam (RESTORIL) 30 MG capsule Take 30 mg by mouth at bedtime as needed for sleep.    . traMADol (ULTRAM) 50 MG tablet Take by mouth every 6 (six) hours as needed.     No current facility-administered medications on file prior to visit.     Past Surgical History:  Procedure Laterality Date  . ABDOMINAL HYSTERECTOMY  1989  . APPENDECTOMY  1960  . BREAST SURGERY  1980   biopsy  . WRIST FRACTURE SURGERY Right 2015    Allergies  Allergen Reactions  . Oxycodone-Acetaminophen Nausea And Vomiting  . Cephalexin     Other reaction(s): HIVES    Social History   Socioeconomic History  . Marital status: Widowed    Spouse name: Not on file  . Number of children: Not on file  . Years of education: Not on file  . Highest education level: Not on file  Social Needs  . Financial resource strain: Not on file  . Food insecurity - worry: Not on file  . Food insecurity - inability: Not on file  . Transportation needs - medical: Not on file  . Transportation needs - non-medical: Not on file  Occupational History  . Not on file  Tobacco Use  . Smoking status: Former Smoker    Packs/day: 0.20    Types: Cigarettes    Last attempt to quit: 06/14/1977    Years since quitting: 40.1  . Smokeless tobacco: Never Used  Substance and Sexual Activity  . Alcohol use: Yes    Alcohol/week: 0.0 oz    Comment: social (about once a month)  . Drug use: No  . Sexual activity: No  Other Topics Concern  . Not on file  Social History Narrative  . Not on file    Family History  Problem Relation Age of Onset  . Cancer Mother   . Mental illness Sister   . Bipolar disorder Sister   . Depression Brother   . Mental illness Brother   . Parkinson's disease Brother     BP (!) 142/81   Pulse 77   Ht 5\' 6"  (1.676 m)   Wt 140 lb (63.5 kg)   BMI 22.60 kg/m   Review of Systems: See HPI above.     Objective:  Physical Exam:  Gen: NAD, comfortable in  exam room.  Neuro: Alert, orientation 4/5 (date) Immediate memory 13/15 Concentration 4/5 Neck FROM without pain or tenderness Balance 0 errors double leg, tandem.  3 errors single leg Coordination normal on left x 5 trials Delayed recall 4/5 Horizontal, vertical saccades, fixed gaze with head rotation 30 trials without symptoms or nystagmus.   Assessment & Plan:  1. Concussion with loss of consciousness - Patient had some symptoms on increase to 6 hour days.  Would hold her at this level for 2 weeks before then going up to 8 hour work days for additional time to accommodate.  Continue home exercises.  F/u in 3 weeks  for reevaluation.  Continue restrictions on computer time.  Total visit time 30 minutes - > 50% of which spent on counseling, answering questions.

## 2017-08-09 NOTE — Assessment & Plan Note (Signed)
Patient had some symptoms on increase to 6 hour days.  Would hold her at this level for 2 weeks before then going up to 8 hour work days for additional time to accommodate.  Continue home exercises.  F/u in 3 weeks for reevaluation.  Continue restrictions on computer time.  Total visit time 30 minutes - > 50% of which spent on counseling, answering questions.

## 2017-08-12 ENCOUNTER — Ambulatory Visit: Payer: PRIVATE HEALTH INSURANCE | Admitting: Speech Pathology

## 2017-08-16 ENCOUNTER — Ambulatory Visit: Payer: PRIVATE HEALTH INSURANCE | Admitting: Speech Pathology

## 2017-08-29 ENCOUNTER — Encounter: Payer: Self-pay | Admitting: Family Medicine

## 2017-08-29 ENCOUNTER — Ambulatory Visit (INDEPENDENT_AMBULATORY_CARE_PROVIDER_SITE_OTHER): Payer: PRIVATE HEALTH INSURANCE | Admitting: Family Medicine

## 2017-08-29 DIAGNOSIS — S060X9D Concussion with loss of consciousness of unspecified duration, subsequent encounter: Secondary | ICD-10-CM

## 2017-08-30 ENCOUNTER — Encounter: Payer: Self-pay | Admitting: Family Medicine

## 2017-08-30 NOTE — Progress Notes (Signed)
PCP: Crecencio Mc, MD  Subjective:   HPI: Patient is a 72 y.o. female here for concussion.  10/25: Patient reports on 10/13 she was going up a ramp and lost her footing, fell forward and onto right shoulder. Caused her to fracture nasal bone, proximal humerus. Reports she had loss of consciousness for a short period. Had headache for 4-5 days with ~5/10 level of pain. Balance problems, dizziness, still feels weak and has trouble focusing. No nausea except from the percocet she took for fractures. No prior history of concussion. Feels things do not look 'quite straight' but denies wavy lines, loss of vision. No weakness, numbness in extremities. She had Guillan Barre in 2005 that causes shakiness in arms since then but otherwise has done well. SCAT 3 18/22 symptoms with score 82/132.  11/21: Patient reports she is doing well. Has done 2 visits of cognitive therapy and 4 visits of physical therapy.   No headache currently - last one two days ago. Still slow with concentration, difficulty expressing herself but improved. Has some dizziness, difficulty focusing at times. Waking up at night. No numbness or weakness. SCAT3 11/22 symptoms with score 33/132.  12/5: Patient reports she is improved since last visit. Has been to 5 visits of cognitive therapy, 6 visits of PT/vestibular rehab. Doing home exercises also. Finds the dizziness is the most frustrating but she has trouble with multitasking, concentrating, and gets tired faster. SCAT 3 9/22 symptoms, severity 16/132.  06/15/17: Patient reports headaches improving - last one 3 days ago. Not sleeping great. Feels something behind her eyes - heaviness, blurriness that comes and goes. Difficulty multitasking. Doing cognitive and PT/vestibular rehab but had about 1 week off of these due to holiday. SCAT3 9/22 symptoms, severity 32/132.  1/29: Patient reports she's doing very well. Excellent progress with vestibular therapy -  great report from them, balance improved. She states she walked for 45 minutes the other day and felt an extremely slight 'feeling' behind her eyes. Last headache was weeks ago now. No nausea.   Rare dizziness. SCAT3 0 symptoms now.  2/12: Patient reports she is doing well. She went to work for 2 hour days each day last week, 4 hours the past 2 days. No headaches. She states after the first day she felt stressed and overwhelmed, had to rest but has done well since. Noticed if she's on computer for a while she gets a 'feeling' behind her eyes. Feels better when she switches activities. Driving without any problems. Does notice she asks coworkers to repeat things more. Not taking any medicines.  2/26: Patient reports she's overall doing well. She did have increased fatigue on first day of 6 hour shifts, a couple days where she had a headache as well. SCAT symptom score 5/22, severity 7/132  3/18: Patient reports she is doing well. She has been able to work 8-hour work days with only little fatigue afterwards. She is doing well with interpreting and denies any complaints. She starts to feel tired from the computer she is taking breaks but this is infrequent now. She does not have any dizziness or the feeling she gets behind her eyes. No headaches. No other complaints. Symptoms 0 out of 22 with symptom score of 0 out of 132  Past Medical History:  Diagnosis Date  . Anxiety   . Depression   . Guillain Barr syndrome ([place]) 2005  . Osteoporosis   . Squamous cell skin cancer 2011    Current Outpatient Medications on File  Prior to Visit  Medication Sig Dispense Refill  . ARIPiprazole (ABILIFY) 5 MG tablet Take 2.5 mg by mouth daily.    . Calcium Carbonate-Vitamin D (CALCIUM 600+D HIGH POTENCY PO) Take 1 tablet by mouth daily.    . Cholecalciferol (VITAMIN D3) 2000 UNITS CHEW Chew 1 capsule by mouth daily. Reported on 08/29/2015    . clorazepate (TRANXENE) 7.5 MG tablet Take 7.5  mg by mouth 2 (two) times daily.     Marland Kitchen FLUoxetine (PROZAC) 20 MG tablet Take 20 mg by mouth daily.     . Levomilnacipran HCl ER (FETZIMA) 20 MG CP24 Take 20 mg by mouth.    Marland Kitchen LORazepam (ATIVAN) 1 MG tablet Take 1 mg by mouth at bedtime.  3  . oxyCODONE-acetaminophen (ROXICET) 5-325 MG tablet Take 1-2 tablets by mouth every 6 (six) hours as needed. (Patient not taking: Reported on 04/15/2017) 12 tablet 0  . tamsulosin (FLOMAX) 0.4 MG CAPS capsule Take 0.4 mg by mouth daily.  11  . temazepam (RESTORIL) 30 MG capsule Take 30 mg by mouth at bedtime as needed for sleep.    . traMADol (ULTRAM) 50 MG tablet Take by mouth every 6 (six) hours as needed.     No current facility-administered medications on file prior to visit.     Past Surgical History:  Procedure Laterality Date  . ABDOMINAL HYSTERECTOMY  1989  . APPENDECTOMY  1960  . BREAST SURGERY  1980   biopsy  . WRIST FRACTURE SURGERY Right 2015    Allergies  Allergen Reactions  . Oxycodone-Acetaminophen Nausea And Vomiting  . Cephalexin     Other reaction(s): HIVES    Social History   Socioeconomic History  . Marital status: Widowed    Spouse name: Not on file  . Number of children: Not on file  . Years of education: Not on file  . Highest education level: Not on file  Social Needs  . Financial resource strain: Not on file  . Food insecurity - worry: Not on file  . Food insecurity - inability: Not on file  . Transportation needs - medical: Not on file  . Transportation needs - non-medical: Not on file  Occupational History  . Not on file  Tobacco Use  . Smoking status: Former Smoker    Packs/day: 0.20    Types: Cigarettes    Last attempt to quit: 06/14/1977    Years since quitting: 40.2  . Smokeless tobacco: Never Used  Substance and Sexual Activity  . Alcohol use: Yes    Alcohol/week: 0.0 oz    Comment: social (about once a month)  . Drug use: No  . Sexual activity: No  Other Topics Concern  . Not on file  Social  History Narrative  . Not on file    Family History  Problem Relation Age of Onset  . Cancer Mother   . Mental illness Sister   . Bipolar disorder Sister   . Depression Brother   . Mental illness Brother   . Parkinson's disease Brother     BP (!) 146/83   Pulse 85   Ht 5\' 6"  (1.676 m)   Wt 140 lb (63.5 kg)   BMI 22.60 kg/m   Review of Systems: See HPI above.     Objective:  Physical Exam:  Gen: NAD, comfortable in exam room.  Neuro: Alert, orientation 5 out of 5 Immediate memory 14 out of 15 Concentration 3 out of 5 Neck full range of motion without pain. Balance  0 errors with double leg and tandem stance.  3 areas with single leg stance. Coordination finger to nose normal on the left. Delayed recall 3 out of 5.  Assessment & Plan:  1. Concussion with loss of consciousness -feel the patient is now back to her baseline functional status.  She has returned to 8-hour work days.  She has reached maximal medical improvement from her concussion with no residual impairment.  Letter provided for her.  She will follow with [place] as needed.  Total visit time 25 minutes greater than 50% of nitrogen was spent on counseling and answering questions.

## 2017-08-30 NOTE — Assessment & Plan Note (Signed)
feel the patient is now back to her baseline functional status.  She has returned to 8-hour work days.  She has reached maximal medical improvement from her concussion with no residual impairment.  Letter provided for her.  She will follow with [place] as needed.  Total visit time 25 minutes greater than 50% of nitrogen was spent on counseling and answering questions.

## 2017-09-07 ENCOUNTER — Telehealth: Payer: Self-pay | Admitting: Internal Medicine

## 2017-09-07 NOTE — Telephone Encounter (Signed)
Copied from [place]. Topic: Appointment Scheduling - Scheduling Inquiry for Clinic >> Sep 07, 2017  8:57 AM [name], NT wrote: Reason for CRM: Patient called and said she would like to get scheduled for her AWV. Patient cpe is scheduled for 10/14/17  >> Sep 07, 2017 10:47 AM Bud Face F wrote: The patient has been scheduled and is aware of her AWV 4.18.19 @ 8:00

## 2017-09-29 ENCOUNTER — Ambulatory Visit (INDEPENDENT_AMBULATORY_CARE_PROVIDER_SITE_OTHER): Payer: Medicare Other

## 2017-09-29 VITALS — BP 138/80 | HR 68 | Temp 98.4°F | Resp 14 | Ht 65.5 in | Wt 137.4 lb

## 2017-09-29 DIAGNOSIS — Z0000 Encounter for general adult medical examination without abnormal findings: Secondary | ICD-10-CM

## 2017-09-29 DIAGNOSIS — Z1239 Encounter for other screening for malignant neoplasm of breast: Secondary | ICD-10-CM

## 2017-09-29 DIAGNOSIS — Z1231 Encounter for screening mammogram for malignant neoplasm of breast: Secondary | ICD-10-CM | POA: Diagnosis not present

## 2017-09-29 NOTE — Progress Notes (Addendum)
Subjective:   Holly [name] is a 72 y.o. female who presents for Medicare Annual (Subsequent) preventive examination.  Review of Systems:  No ROS.  Medicare Wellness Visit. Additional risk factors are reflected in the social history. Cardiac Risk Factors include: advanced age (>46men, >109 women)     Objective:     Vitals: BP 138/80 (BP Location: Left Arm, Patient Position: Sitting, Cuff Size: Normal)   Pulse 68   Temp 98.4 F (36.9 C) (Oral)   Resp 14   Ht 5' 5.5" (1.664 m)   Wt 137 lb 6.4 oz (62.3 kg)   SpO2 99%   BMI 22.52 kg/m   Body mass index is 22.52 kg/m.  Advanced Directives 09/29/2017 05/24/2017 04/15/2017 03/26/2017 09/15/2015  Does Patient Have a Medical Advance Directive? Yes Yes Yes Yes Yes  Type of Paramedic of [place];Living will Living will Living will Living will [place] Perez;Living will  Does patient want to make changes to medical advance directive? No - Patient declined - - - -  Copy of [place] in Chart? No - copy requested - - - No - copy requested  Would patient like information on creating a medical advance directive? - - - No - Patient declined -    Tobacco Social History   Tobacco Use  Smoking Status Former Smoker  . Packs/day: 0.20  . Types: Cigarettes  . Last attempt to quit: 06/14/1977  . Years since quitting: 40.3  Smokeless Tobacco Never Used     Counseling given: Not Answered   Clinical Intake:  Pre-visit preparation completed: Yes  Pain : 0-10 Pain Score: 4  Pain Location: Shoulder Pain Orientation: Right Pain Descriptors / Indicators: Constant, Aching Pain Onset: More than a month ago Pain Frequency: Constant     Nutritional Status: BMI of 19-24  Normal Diabetes: No  How often do you need to have someone help you when you read instructions, pamphlets, or other written materials from your doctor or pharmacy?: 1 - Never  Interpreter Needed?: No     Past  Medical History:  Diagnosis Date  . Anxiety   . Depression   . Guillain Barr syndrome ([place]) 2005  . Osteoporosis   . Squamous cell skin cancer 2011   Past Surgical History:  Procedure Laterality Date  . ABDOMINAL HYSTERECTOMY  1989  . APPENDECTOMY  1960  . BREAST SURGERY  1980   biopsy  . WRIST FRACTURE SURGERY Right 2015   Family History  Problem Relation Age of Onset  . Cancer Mother   . Mental illness Sister   . Bipolar disorder Sister   . Depression Brother   . Mental illness Brother   . Parkinson's disease Brother   . Anxiety disorder Daughter    Social History   Socioeconomic History  . Marital status: Widowed    Spouse name: Not on file  . Number of children: Not on file  . Years of education: Not on file  . Highest education level: Not on file  Occupational History  . Not on file  Social Needs  . Financial resource strain: Not hard at all  . Food insecurity:    Worry: Never true    Inability: Never true  . Transportation needs:    Medical: No    Non-medical: No  Tobacco Use  . Smoking status: Former Smoker    Packs/day: 0.20    Types: Cigarettes    Last attempt to quit: 06/14/1977  Years since quitting: 40.3  . Smokeless tobacco: Never Used  Substance and Sexual Activity  . Alcohol use: Yes    Alcohol/week: 0.0 oz    Comment: social (about once a month)  . Drug use: No  . Sexual activity: Never  Lifestyle  . Physical activity:    Days per week: 0 days    Minutes per session: Not on file  . Stress: Not on file  Relationships  . Social connections:    Talks on phone: Not on file    Gets together: Not on file    Attends religious service: Not on file    Active member of club or organization: Not on file    Attends meetings of clubs or organizations: Not on file    Relationship status: Not on file  Other Topics Concern  . Not on file  Social History Narrative  . Not on file    Outpatient Encounter Medications as of 09/29/2017    Medication Sig  . ARIPiprazole (ABILIFY) 5 MG tablet Take 2.5 mg by mouth daily.  . Calcium Carbonate-Vitamin D (CALCIUM 600+D HIGH POTENCY PO) Take 1 tablet by mouth daily.  . Cholecalciferol (VITAMIN D3) 2000 UNITS CHEW Chew 1 capsule by mouth daily. Reported on 08/29/2015  . clorazepate (TRANXENE) 7.5 MG tablet Take 7.5 mg by mouth 2 (two) times daily.   . Levomilnacipran HCl (FETZIMA PO) Take 1 capsule by mouth daily.  . tamsulosin (FLOMAX) 0.4 MG CAPS capsule Take 0.4 mg by mouth daily.  . [DISCONTINUED] FLUoxetine (PROZAC) 20 MG tablet Take 20 mg by mouth daily.   . [DISCONTINUED] Levomilnacipran HCl ER (FETZIMA) 20 MG CP24 Take 20 mg by mouth.  . [DISCONTINUED] LORazepam (ATIVAN) 1 MG tablet Take 1 mg by mouth at bedtime.  . [DISCONTINUED] oxyCODONE-acetaminophen (ROXICET) 5-325 MG tablet Take 1-2 tablets by mouth every 6 (six) hours as needed.  . [DISCONTINUED] temazepam (RESTORIL) 30 MG capsule Take 30 mg by mouth at bedtime as needed for sleep.  . [DISCONTINUED] traMADol (ULTRAM) 50 MG tablet Take by mouth every 6 (six) hours as needed.   No facility-administered encounter medications on file as of 09/29/2017.     Activities of Daily Living In your present state of health, do you have any difficulty performing the following activities: 09/29/2017  Hearing? N  Vision? N  Difficulty concentrating or making decisions? Y  Comment Difficulty focusing and concentrating at times  Walking or climbing stairs? N  Dressing or bathing? N  Doing errands, shopping? N  Preparing Food and eating ? N  Using the Toilet? N  In the past six months, have you accidently leaked urine? N  Do you have problems with loss of bowel control? N  Managing your Medications? N  Managing your Finances? N  Housekeeping or managing your Housekeeping? N  Some recent data might be hidden    Patient Care Team: Crecencio Mc, MD as PCP - General (Internal Medicine)    Assessment:   This is a routine  wellness examination for Holly [name].  The goal of the wellness visit is to assist the patient how to close the gaps in care and create a preventative care plan for the patient.   The roster of all physicians providing medical care to patient is listed in the Snapshot section of the chart.  Taking calcium VIT D as appropriate/Osteoporosis risk reviewed.    Safety issues reviewed; Smoke and carbon monoxide detectors in the home. No firearms in the home. Wears seatbelts  when driving or riding with others. No violence in the home.  They do not have excessive sun exposure.  Discussed the need for sun protection: hats, long sleeves and the use of sunscreen if there is significant sun exposure.  Patient is alert, normal appearance, oriented to person/place/and time.   No failures at ADL's or IADL's.    BMI- discussed the importance of a healthy diet, water intake and the benefits of aerobic exercise. Educational material provided.   24 hour diet recall: Weight watchers diet.  Dental- every 3 months.  Eye- Visual acuity not assessed per patient preference since they have regular follow up with the ophthalmologist.  Wears corrective lenses.  Sleep patterns- Sleeps 4-6 hours at night.   Mammogram ordered; follow as directed.  Educational material provided.  TDAP vaccine deferred per patient preference.  Follow up with insurance.  Educational material provided.  Exercise Activities and Dietary recommendations Current Exercise Habits: Home exercise routine, Type of exercise: stretching(ROM exercises), Time (Minutes): 20, Frequency (Times/Week): 4, Weekly Exercise (Minutes/Week): 80, Intensity: Mild  Goals    . DIET - INCREASE WATER INTAKE    . Increase physical activity     Walking for exercise       Fall Risk Fall Risk  09/29/2017 03/26/2016 09/15/2015 06/30/2015 03/16/2013  Falls in the past year? Yes No No Yes Yes  Number falls in past yr: 1 - - 1 1  Injury with Fall? Yes - - No Yes    Comment Medical care recieved.  Followed by Neuro and PCP. - - - -  Risk Factor Category  - - - - High Fall Risk  Risk for fall due to : - - - Other (Comment);Impaired mobility;Impaired vision History of fall(s);Impaired mobility  Risk for fall due to: Comment - - - tripped and fell depression medications -  Follow up - - - Falls prevention discussed -   Depression Screen PHQ 2/9 Scores 09/29/2017 09/15/2015 06/30/2015  PHQ - 2 Score - 3 5  PHQ- 9 Score - 11 17  Exception Documentation Other- indicate reason in comment box - -  Not completed Followed by therapist Sheran Luz bi-weekly, and psychiatrist Dr. Casimiro Needle every 5 months - -     Cognitive Function MMSE - Mini Mental State Exam 09/29/2017 09/15/2015  Not completed: Unable to complete -  Orientation to time - 5  Orientation to Place - 5  Registration - 3  Attention/ Calculation - 5  Recall - 3  Language- name 2 objects - 2  Language- repeat - 1  Language- follow 3 step command - 3  Language- read & follow direction - 1  Write a sentence - 1  Copy design - 1  Total score - 30    MMSE- last completed 08/29/17.  Followed by Karlton Lemon, MD for concussion with loss of consciousness and Robb Matar [facility], SLP for cognitive communication deficit.      Immunization History  Administered Date(s) Administered  . Td 06/14/2006    Screening Tests Health Maintenance  Topic Date Due  . MAMMOGRAM  05/08/2013  . TETANUS/TDAP  06/14/2016  . Fecal DNA (Cologuard)  07/16/2018  . DEXA SCAN  Completed  . Hepatitis C Screening  Completed      Plan:    End of life planning; Advance aging; Advanced directives discussed. Copy of current HCPOA/Living Will requested.    I have personally reviewed and noted the following in the patient's chart:   . Medical and social history .  Use of alcohol, tobacco or illicit drugs  . Current medications and supplements . Functional ability and status . Nutritional status . Physical  activity . Advanced directives . List of other physicians . Hospitalizations, surgeries, and ER visits in previous 12 months . Vitals . Screenings to include cognitive, depression, and falls . Referrals and appointments  In addition, I have reviewed and discussed with patient certain preventive protocols, quality metrics, and best practice recommendations. A written personalized care plan for preventive services as well as general preventive health recommendations were provided to patient.     OBrien-Blaney, Alistair Senft L, LPN  1/61/0960   I have reviewed the above information and agree with above.   Deborra Medina, MD

## 2017-09-29 NOTE — Patient Instructions (Addendum)
Holly [name] , Thank you for taking time to come for your Medicare Wellness Visit. I appreciate your ongoing commitment to your health goals. Please review the following plan we discussed and let me know if I can assist you in the future.   Follow up  as needed.    Bring a copy of your [place] and/or Living Will to be scanned into chart.  Have a great day!  These are the goals we discussed: Goals    . DIET - INCREASE WATER INTAKE    . Increase physical activity     Walking for exercise       This is a list of the screening recommended for you and due dates:  Health Maintenance  Topic Date Due  . Mammogram  05/08/2013  . Tetanus Vaccine  06/14/2016  . Cologuard (Stool DNA test)  07/16/2018  . DEXA scan (bone density measurement)  Completed  .  Hepatitis C: One time screening is recommended by Center for Disease Control  (CDC) for  adults born from [birth year] through 1963.   Completed    Mammogram A mammogram is an X-ray of the breasts that is done to check for abnormal changes. This procedure can screen for and detect any changes that may suggest breast cancer. A mammogram can also identify other changes and variations in the breast, such as:  Inflammation of the breast tissue (mastitis).  An infected area that contains a collection of pus (abscess).  A fluid-filled sac (cyst).  Fibrocystic changes. This is when breast tissue becomes denser, which can make the tissue feel rope-like or uneven under the skin.  Tumors that are not cancerous (benign).  Tell a health care provider about:  Any allergies you have.  If you have breast implants.  If you have had previous breast disease, biopsy, or surgery.  If you are breastfeeding.  Any possibility that you could be pregnant, if this applies.  If you are younger than age 54.  If you have a family history of breast cancer. What are the risks? Generally, this is a safe procedure. However, problems may  occur, including:  Exposure to radiation. Radiation levels are very low with this test.  The results being misinterpreted.  The need for further tests.  The inability of the mammogram to detect certain cancers.  What happens before the procedure?  Schedule your test about 1-2 weeks after your menstrual period. This is usually when your breasts are the least tender.  If you have had a mammogram done at a different facility in the past, get the mammogram X-rays or have them sent to your current exam facility in order to compare them.  Wash your breasts and under your arms the day of the test.  Do not wear deodorants, perfumes, lotions, or powders anywhere on your body on the day of the test.  Remove any jewelry from your neck.  Wear clothes that you can change into and out of easily. What happens during the procedure?  You will undress from the waist up and put on a gown.  You will stand in front of the X-ray machine.  Each breast will be placed between two plastic or glass plates. The plates will compress your breast for a few seconds. Try to stay as relaxed as possible during the procedure. This does not cause any harm to your breasts and any discomfort you feel will be very brief.  X-rays will be taken from different angles of each  breast. The procedure may vary among health care providers and hospitals. What happens after the procedure?  The mammogram will be examined by a specialist (radiologist).  You may need to repeat certain parts of the test, depending on the quality of the images. This is commonly done if the radiologist needs a better view of the breast tissue.  Ask when your test results will be ready. Make sure you get your test results.  You may resume your normal activities. This information is not intended to replace advice given to you by your health care provider. Make sure you discuss any questions you have with your health care provider. Document  Released: 05/28/2000 Document Revised: 11/03/2015 Document Reviewed: 08/09/2014 Elsevier Interactive Patient Education  [employer].

## 2017-10-07 ENCOUNTER — Ambulatory Visit
Admission: RE | Admit: 2017-10-07 | Discharge: 2017-10-07 | Disposition: A | Discharge status: Home / Self Care | Payer: Medicare Other | Source: Ambulatory Visit | Attending: Internal Medicine | Admitting: Internal Medicine

## 2017-10-07 DIAGNOSIS — Z1239 Encounter for other screening for malignant neoplasm of breast: Secondary | ICD-10-CM

## 2017-10-07 DIAGNOSIS — Z1231 Encounter for screening mammogram for malignant neoplasm of breast: Secondary | ICD-10-CM | POA: Diagnosis present

## 2017-10-14 ENCOUNTER — Encounter (INDEPENDENT_AMBULATORY_CARE_PROVIDER_SITE_OTHER): Payer: Self-pay

## 2017-10-14 ENCOUNTER — Encounter: Payer: Self-pay | Admitting: Internal Medicine

## 2017-10-14 ENCOUNTER — Ambulatory Visit (INDEPENDENT_AMBULATORY_CARE_PROVIDER_SITE_OTHER): Payer: Medicare Other | Admitting: Internal Medicine

## 2017-10-14 ENCOUNTER — Telehealth: Payer: Self-pay | Admitting: *Deleted

## 2017-10-14 VITALS — BP 120/70 | HR 63 | Temp 97.6°F | Resp 14 | Ht 65.5 in | Wt 137.6 lb

## 2017-10-14 DIAGNOSIS — Z0000 Encounter for general adult medical examination without abnormal findings: Secondary | ICD-10-CM

## 2017-10-14 DIAGNOSIS — M810 Age-related osteoporosis without current pathological fracture: Secondary | ICD-10-CM | POA: Diagnosis not present

## 2017-10-14 DIAGNOSIS — R5383 Other fatigue: Secondary | ICD-10-CM | POA: Diagnosis not present

## 2017-10-14 DIAGNOSIS — E559 Vitamin D deficiency, unspecified: Secondary | ICD-10-CM | POA: Diagnosis not present

## 2017-10-14 DIAGNOSIS — G2581 Restless legs syndrome: Secondary | ICD-10-CM

## 2017-10-14 DIAGNOSIS — F331 Major depressive disorder, recurrent, moderate: Secondary | ICD-10-CM

## 2017-10-14 LAB — LIPID PANEL
Cholesterol: 170 mg/dL (ref 0–200)
HDL: 67.9 mg/dL (ref >39.00)
LDL Cholesterol: 92 mg/dL (ref 0–99)
NONHDL: 102.46
Total CHOL/HDL Ratio: 3
Triglycerides: 53 mg/dL (ref 0.0–149.0)
VLDL: 10.6 mg/dL (ref 0.0–40.0)

## 2017-10-14 LAB — COMPREHENSIVE METABOLIC PANEL
ALK PHOS: 35 U/L — AB (ref 39–117)
ALT: 12 U/L (ref 0–35)
AST: 12 U/L (ref 0–37)
Albumin: 4.3 g/dL (ref 3.5–5.2)
BUN: 25 mg/dL — AB (ref 6–23)
CO2: 29 mEq/L (ref 19–32)
Calcium: 9.6 mg/dL (ref 8.4–10.5)
Chloride: 103 mEq/L (ref 96–112)
Creatinine, Ser: 0.75 mg/dL (ref 0.40–1.20)
GFR: 81.26 mL/min (ref >60.00)
GLUCOSE: 80 mg/dL (ref 70–99)
POTASSIUM: 4.2 meq/L (ref 3.5–5.1)
Sodium: 140 mEq/L (ref 135–145)
TOTAL PROTEIN: 7.2 g/dL (ref 6.0–8.3)
Total Bilirubin: 0.5 mg/dL (ref 0.2–1.2)

## 2017-10-14 LAB — IRON,TIBC AND FERRITIN PANEL
%SAT: 33 % (calc) (ref 11–50)
Ferritin: 93 ng/mL (ref 20–288)
IRON: 100 ug/dL (ref 45–160)
TIBC: 306 ug/dL (ref 250–450)

## 2017-10-14 NOTE — Progress Notes (Signed)
Patient ID: Holly [name], female    DOB: 10-Oct-1945  Age: 72 y.o. MRN: [id number]  The patient is here for annual preventive examination and management of other chronic and acute problems.  Mammogram done April 26  Cologuard Feb 2017 normal  DEXA 2015. Has Osteoporosis receiving Prolia   declins Tdap and Shingrix due to history of GB with prior vaccine     The risk factors are reflected in the social history.  The roster of all physicians providing medical care to patient - is listed in the Snapshot section of the chart.  Activities of daily living:  The patient is 100% independent in all ADLs: dressing, toileting, feeding as well as independent mobility  Home safety : The patient has smoke detectors in the home. They wear seatbelts.  There are no firearms at home. There is no violence in the home.   There is no risks for hepatitis, STDs or HIV. There is no   history of blood transfusion. They have no travel history to infectious disease endemic areas of the world.  The patient has seen their dentist in the last six month. They have seen their eye doctor in the last year. They admit to slight hearing difficulty with regard to whispered voices and some television programs.  They have deferred audiologic testing in the last year.  They do not  have excessive sun exposure. Discussed the need for sun protection: hats, long sleeves and use of sunscreen if there is significant sun exposure.   Diet: the importance of a healthy diet is discussed. They do have a healthy diet.  The benefits of regular aerobic exercise were discussed. She walks 4 times per week ,  20 minutes.   Depression screen: there are no signs or vegative symptoms of depression- irritability, change in appetite, anhedonia, sadness/tearfullness.  Cognitive assessment: the patient manages all their financial and personal affairs and is actively engaged. They could relate day,date,year and events; recalled 2/3 objects at 3  minutes; performed clock-face test normally.  The following portions of the patient's history were reviewed and updated as appropriate: allergies, current medications, past family history, past medical history,  past surgical history, past social history  and problem list.  Visual acuity was not assessed per patient preference since she has regular follow up with her ophthalmologist. Hearing and body mass index were assessed and reviewed.   During the course of the visit the patient was educated and counseled about appropriate screening and preventive services including : fall prevention , diabetes screening, nutrition counseling, colorectal cancer screening, and recommended immunizations.    CC: The primary encounter diagnosis was Vitamin D deficiency. Diagnoses of Fatigue, unspecified type, Restless legs, Recurrent depressive disorder, current episode moderate ([place]), Routine general medical examination at a health care facility, and Postmenopausal osteoporosis were also pertinent to this visit. Weight loss, intentional since last visit.  Got up to 205 lbs before Joining weight watchers. Has lost a total of 65 lbs.    Was  referred to neurology for evaluation of worsening tremor ,  FH of Parkinson's . 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  In Oct 2018 she suffered a concussion,  right humeral fracture,  closed right nasal fracture   after a fall onto right shoulder off a ramp.   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    Referred for outpatient PT  Which has been completed for right shoulder,   Vestibular and cognitive therapy all complete.  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    History Holly [name] has a past medical history of Anxiety, Depression, Guillain Barr syndrome ([place]) (2005), Osteoporosis, and Squamous cell skin cancer (2011).   She has a past surgical history that includes Breast surgery (1980); Appendectomy (1960); Abdominal hysterectomy (1989); and Wrist fracture surgery (Right, 2015).   Her family history includes Anxiety disorder in her daughter; Bipolar disorder in her sister; Cancer in her mother; Depression in her brother; Mental illness in her brother and sister; Parkinson's disease in her brother.She reports that she quit smoking about 40 years ago. Her smoking use included cigarettes. She smoked 0.20 packs per day. She has never used smokeless tobacco. She reports that she drinks alcohol. She reports that she does not use drugs.  Outpatient Medications Prior to Visit  Medication Sig Dispense Refill  . ARIPiprazole (ABILIFY) 5 MG tablet Take 2.5 mg by mouth daily.    . Cholecalciferol (VITAMIN D3) 2000 UNITS CHEW Chew 1 capsule by mouth daily. Reported on 08/29/2015    . clorazepate (TRANXENE) 7.5 MG tablet Take 7.5 mg by mouth 2 (two) times daily.     . Levomilnacipran HCl (FETZIMA PO) Take 1 capsule by mouth daily.    . tamsulosin (FLOMAX) 0.4 MG CAPS capsule Take 0.4 mg by mouth daily.  11  . Calcium Carbonate-Vitamin D (CALCIUM 600+D HIGH POTENCY PO) Take 1 tablet by mouth daily.     No facility-administered medications prior to visit.     Review of Systems  Objective:  BP 120/70 (BP Location: Left Arm, Patient Position: Sitting, Cuff Size: Normal)   Pulse 63   Temp 97.6 F (36.4 C) (Oral)   Resp 14   Ht 5' 5.5" (1.664 m)   Wt 137 lb 9.6 oz (62.4 kg)   SpO2 100%   BMI 22.55 kg/m   Physical Exam    Assessment & Plan:   Problem List Items Addressed This Visit    Routine general medical examination at a  health care facility    Annual comprehensive preventive exam was done as well as an evaluation and management of chronic conditions .  During the course of the visit the patient was educated and counseled about appropriate screening and preventive services including :  diabetes screening, lipid analysis with projected  10 year  risk for CAD , nutrition counseling, breast, cervical and colorectal cancer screening, and recommended immunizations.  Printed recommendations for health maintenance screenings was given.  Lab Results  Component Value Date   CHOL 170 10/14/2017   HDL 67.90 10/14/2017   LDLCALC 92 10/14/2017   LDLDIRECT 129.0 06/30/2015   TRIG 53.0 10/14/2017   CHOLHDL 3 10/14/2017   Lab Results  Component Value Date   TSH 1.87 10/14/2017   Lab Results  Component Value Date   CREATININE 0.75 10/14/2017   Lab Results  Component Value Date   ALT 12 10/14/2017   AST 12 10/14/2017   ALKPHOS 35 (L) 10/14/2017   BILITOT 0.5 10/14/2017         Recurrent depressive disorder, current episode  moderate ([place])    Managed by her psychiatrist with abilify and fetzima  With plans to wean abilify       Postmenopausal osteoporosis    With confirmative T scores and history of recent wrist fracture in 2014 secondary to fall. Secondary to surgical menopause at age 40 followed by only 3 years of oral hormone therapy. Patient has deferred use of alendronate which I agree may be problematic given her history of Guillain-Barr affecting the bulbar muscles. Will try to get Prolia approved, and if not,  Evista.         Other Visit Diagnoses    Vitamin D deficiency    -  Primary   Relevant Orders   VITAMIN D 25 Hydroxy (Vit-D Deficiency, Fractures) (Completed)   Fatigue, unspecified type       Relevant Orders   Comprehensive metabolic panel (Completed)   Lipid panel (Completed)   TSH (Completed)   CBC w/Diff   Restless legs       Relevant Orders   Iron, TIBC and Ferritin Panel  (Completed)      I have discontinued Bonnita Nasuti E. Caras's Calcium Carbonate-Vitamin D (CALCIUM 600+D HIGH POTENCY PO). I am also having her maintain her ARIPiprazole, clorazepate, Vitamin D3, tamsulosin, and Levomilnacipran HCl (FETZIMA PO).  No orders of the defined types were placed in this encounter.   Medications Discontinued During This Encounter  Medication Reason  . Calcium Carbonate-Vitamin D (CALCIUM 600+D HIGH POTENCY PO) Patient has not taken in last 30 days    Follow-up: No follow-ups on file.   Crecencio Mc, MD

## 2017-10-14 NOTE — Patient Instructions (Addendum)
Good to see you again!    Regarding your calcium and Vitamin D needs:     I recommend takingOne daily calcium carbonate with food,  Get The rest (you 1200 mg more  ) through diet ,  You absorb 600 mg at a time so spread it out (yogurt,  Soy  Milk,  Green vegetables)   Vit D dose will depend on level  Today   Health Maintenance for Postmenopausal Women Menopause is a normal process in which your reproductive ability comes to an end. This process happens gradually over a span of months to years, usually between the ages of 51 and 56. Menopause is complete when you have missed 12 consecutive menstrual periods. It is important to talk with your health care provider about some of the most common conditions that affect postmenopausal women, such as heart disease, cancer, and bone loss (osteoporosis). Adopting a healthy lifestyle and getting preventive care can help to promote your health and wellness. Those actions can also lower your chances of developing some of these common conditions. What should I know about menopause? During menopause, you may experience a number of symptoms, such as:  Moderate-to-severe hot flashes.  Night sweats.  Decrease in sex drive.  Mood swings.  Headaches.  Tiredness.  Irritability.  Memory problems.  Insomnia.  Choosing to treat or not to treat menopausal changes is an individual decision that you make with your health care provider. What should I know about hormone replacement therapy and supplements? Hormone therapy products are effective for treating symptoms that are associated with menopause, such as hot flashes and night sweats. Hormone replacement carries certain risks, especially as you become older. If you are thinking about using estrogen or estrogen with progestin treatments, discuss the benefits and risks with your health care provider. What should I know about heart disease and stroke? Heart disease, heart attack, and stroke become more  likely as you age. This may be due, in part, to the hormonal changes that your body experiences during menopause. These can affect how your body processes dietary fats, triglycerides, and cholesterol. Heart attack and stroke are both medical emergencies. There are many things that you can do to help prevent heart disease and stroke:  Have your blood pressure checked at least every 1-2 years. High blood pressure causes heart disease and increases the risk of stroke.  If you are 78-42 years old, ask your health care provider if you should take aspirin to prevent a heart attack or a stroke.  Do not use any tobacco products, including cigarettes, chewing tobacco, or electronic cigarettes. If you need help quitting, ask your health care provider.  It is important to eat a healthy diet and maintain a healthy weight. ? Be sure to include plenty of vegetables, fruits, low-fat dairy products, and lean protein. ? Avoid eating foods that are high in solid fats, added sugars, or salt (sodium).  Get regular exercise. This is one of the most important things that you can do for your health. ? Try to exercise for at least 150 minutes each week. The type of exercise that you do should increase your heart rate and make you sweat. This is known as moderate-intensity exercise. ? Try to do strengthening exercises at least twice each week. Do these in addition to the moderate-intensity exercise.  Know your numbers.Ask your health care provider to check your cholesterol and your blood glucose. Continue to have your blood tested as directed by your health care provider.  What  should I know about cancer screening? There are several types of cancer. Take the following steps to reduce your risk and to catch any cancer development as early as possible. Breast Cancer  Practice breast self-awareness. ? This means understanding how your breasts normally appear and feel. ? It also means doing regular breast self-exams.  Let your health care provider know about any changes, no matter how small.  If you are 90 or older, have a clinician do a breast exam (clinical breast exam or CBE) every year. Depending on your age, family history, and medical history, it may be recommended that you also have a yearly breast X-ray (mammogram).  If you have a family history of breast cancer, talk with your health care provider about genetic screening.  If you are at high risk for breast cancer, talk with your health care provider about having an MRI and a mammogram every year.  Breast cancer (BRCA) gene test is recommended for women who have family members with BRCA-related cancers. Results of the assessment will determine the need for genetic counseling and BRCA1 and for BRCA2 testing. BRCA-related cancers include these types: ? Breast. This occurs in males or females. ? Ovarian. ? Tubal. This may also be called fallopian tube cancer. ? Cancer of the abdominal or pelvic lining (peritoneal cancer). ? Prostate. ? Pancreatic.  Cervical, Uterine, and Ovarian Cancer Your health care provider may recommend that you be screened regularly for cancer of the pelvic organs. These include your ovaries, uterus, and vagina. This screening involves a pelvic exam, which includes checking for microscopic changes to the surface of your cervix (Pap test).  For women ages 21-65, health care providers may recommend a pelvic exam and a Pap test every three years. For women ages 79-65, they may recommend the Pap test and pelvic exam, combined with testing for human papilloma virus (HPV), every five years. Some types of HPV increase your risk of cervical cancer. Testing for HPV may also be done on women of any age who have unclear Pap test results.  Other health care providers may not recommend any screening for nonpregnant women who are considered low risk for pelvic cancer and have no symptoms. Ask your health care provider if a screening pelvic exam  is right for you.  If you have had past treatment for cervical cancer or a condition that could lead to cancer, you need Pap tests and screening for cancer for at least 20 years after your treatment. If Pap tests have been discontinued for you, your risk factors (such as having a new sexual partner) need to be reassessed to determine if you should start having screenings again. Some women have medical problems that increase the chance of getting cervical cancer. In these cases, your health care provider may recommend that you have screening and Pap tests more often.  If you have a family history of uterine cancer or ovarian cancer, talk with your health care provider about genetic screening.  If you have vaginal bleeding after reaching menopause, tell your health care provider.  There are currently no reliable tests available to screen for ovarian cancer.  Lung Cancer Lung cancer screening is recommended for adults 52-36 years old who are at high risk for lung cancer because of a history of smoking. A yearly low-dose CT scan of the lungs is recommended if you:  Currently smoke.  Have a history of at least 30 pack-years of smoking and you currently smoke or have quit within the past 15  years. A pack-year is smoking an average of one pack of cigarettes per day for one year.  Yearly screening should:  Continue until it has been 15 years since you quit.  Stop if you develop a health problem that would prevent you from having lung cancer treatment.  Colorectal Cancer  This type of cancer can be detected and can often be prevented.  Routine colorectal cancer screening usually begins at age 73 and continues through age 11.  If you have risk factors for colon cancer, your health care provider may recommend that you be screened at an earlier age.  If you have a family history of colorectal cancer, talk with your health care provider about genetic screening.  Your health care provider may also  recommend using home test kits to check for hidden blood in your stool.  A small camera at the end of a tube can be used to examine your colon directly (sigmoidoscopy or colonoscopy). This is done to check for the earliest forms of colorectal cancer.  Direct examination of the colon should be repeated every 5-10 years until age 48. However, if early forms of precancerous polyps or small growths are found or if you have a family history or genetic risk for colorectal cancer, you may need to be screened more often.  Skin Cancer  Check your skin from head to toe regularly.  Monitor any moles. Be sure to tell your health care provider: ? About any new moles or changes in moles, especially if there is a change in a mole's shape or color. ? If you have a mole that is larger than the size of a pencil eraser.  If any of your family members has a history of skin cancer, especially at a young age, talk with your health care provider about genetic screening.  Always use sunscreen. Apply sunscreen liberally and repeatedly throughout the day.  Whenever you are outside, protect yourself by wearing long sleeves, pants, a wide-brimmed hat, and sunglasses.  What should I know about osteoporosis? Osteoporosis is a condition in which bone destruction happens more quickly than new bone creation. After menopause, you may be at an increased risk for osteoporosis. To help prevent osteoporosis or the bone fractures that can happen because of osteoporosis, the following is recommended:  If you are 54-15 years old, get at least 1,000 mg of calcium and at least 600 mg of vitamin D per day.  If you are older than age 30 but younger than age 59, get at least 1,200 mg of calcium and at least 600 mg of vitamin D per day.  If you are older than age 29, get at least 1,200 mg of calcium and at least 800 mg of vitamin D per day.  Smoking and excessive alcohol intake increase the risk of osteoporosis. Eat foods that are  rich in calcium and vitamin D, and do weight-bearing exercises several times each week as directed by your health care provider. What should I know about how menopause affects my mental health? Depression may occur at any age, but it is more common as you become older. Common symptoms of depression include:  Low or sad mood.  Changes in sleep patterns.  Changes in appetite or eating patterns.  Feeling an overall lack of motivation or enjoyment of activities that you previously enjoyed.  Frequent crying spells.  Talk with your health care provider if you think that you are experiencing depression. What should I know about immunizations? It is important that  you get and maintain your immunizations. These include:  Tetanus, diphtheria, and pertussis (Tdap) booster vaccine.  Influenza every year before the flu season begins.  Pneumonia vaccine.  Shingles vaccine.  Your health care provider may also recommend other immunizations. This information is not intended to replace advice given to you by your health care provider. Make sure you discuss any questions you have with your health care provider. Document Released: 07/23/2005 Document Revised: 12/19/2015 Document Reviewed: 03/04/2015 Elsevier Interactive Patient Education  2018 [employer].

## 2017-10-14 NOTE — Telephone Encounter (Signed)
Received a call from [facility] lab stating that CBC tube has been misplaced. Unable to run test. I have reordered it. Please let me know if you would like her to return soon to redraw for it.

## 2017-10-15 LAB — VITAMIN D 25 HYDROXY (VIT D DEFICIENCY, FRACTURES): VITD: 42.05 ng/mL (ref 30.00–100.00)

## 2017-10-15 LAB — TSH: TSH: 1.87 u[IU]/mL (ref 0.35–4.50)

## 2017-10-16 NOTE — Assessment & Plan Note (Addendum)
Annual comprehensive preventive exam was done as well as an evaluation and management of chronic conditions .  During the course of the visit the patient was educated and counseled about appropriate screening and preventive services including :  diabetes screening, lipid analysis with projected  10 year  risk for CAD , nutrition counseling, breast, cervical and colorectal cancer screening, and recommended immunizations.  Printed recommendations for health maintenance screenings was given.  Lab Results  Component Value Date   CHOL 170 10/14/2017   HDL 67.90 10/14/2017   LDLCALC 92 10/14/2017   LDLDIRECT 129.0 06/30/2015   TRIG 53.0 10/14/2017   CHOLHDL 3 10/14/2017   Lab Results  Component Value Date   TSH 1.87 10/14/2017   Lab Results  Component Value Date   CREATININE 0.75 10/14/2017   Lab Results  Component Value Date   ALT 12 10/14/2017   AST 12 10/14/2017   ALKPHOS 35 (L) 10/14/2017   BILITOT 0.5 10/14/2017

## 2017-10-16 NOTE — Assessment & Plan Note (Signed)
Managed by her psychiatrist with abilify and fetzima  With plans to wean abilify

## 2017-10-16 NOTE — Assessment & Plan Note (Signed)
With confirmative T scores and history of recent wrist fracture in 2014 secondary to fall. Secondary to surgical menopause at age 72 followed by only 3 years of oral hormone therapy. Patient has deferred use of alendronate which I agree may be problematic given her history of Guillain-Barr affecting the bulbar muscles. Will try to get Prolia approved, and if not,  Evista.

## 2017-10-20 ENCOUNTER — Encounter: Payer: Self-pay | Admitting: Family Medicine

## 2017-10-20 ENCOUNTER — Ambulatory Visit (INDEPENDENT_AMBULATORY_CARE_PROVIDER_SITE_OTHER): Payer: PRIVATE HEALTH INSURANCE | Admitting: Family Medicine

## 2017-10-20 VITALS — BP 118/80 | HR 78 | Ht 66.0 in | Wt 138.0 lb

## 2017-10-20 DIAGNOSIS — S060X9D Concussion with loss of consciousness of unspecified duration, subsequent encounter: Secondary | ICD-10-CM | POA: Diagnosis not present

## 2017-10-20 NOTE — Patient Instructions (Signed)
We will refer you to neurology, ideally back to Dr. Carles Collet since she's familiar with you and has examined you previously, to ensure there aren't any neurologic conditions in addition to your concussion. If this evaluation is normal I would recommend comprehensive neurocognitive testing. I would recommend you not do any complicated translating (behavioral cases as we discussed that require you to think and translate at the same time).  Ideally focus on phone translation when possible so you can make notes as you go.

## 2017-10-22 ENCOUNTER — Encounter: Payer: Self-pay | Admitting: Family Medicine

## 2017-10-22 NOTE — Assessment & Plan Note (Signed)
Patient reports increased symptoms since returning to more complex translations (in person, behavioral).  We discussed it's unusual for people to worsen after they improve but in her case she wasn't do complex translations at last visit.  She has seen neurology 1.5 years ago and had diagnosis of medication-induced parkinsonism.  On exam she does have masked facies, a slight tremor.  I think it's prudent we assess for complicating medical conditions such as Parkinsons, dementia that could be occurring concurrently with her postconcussion syndrome.  As such will refer to neurology.  If this is normal I would recommend comprehensive neurocognitive testing.  We discussed work restrictions (see note).  Total visit time 30 minutes - > 50% of which spent on counseling, answering questions.

## 2017-10-22 NOTE — Progress Notes (Signed)
PCP: Crecencio Mc, MD  Subjective:   HPI: Patient is a 72 y.o. female here for concussion.  10/25: Patient reports on 10/13 she was going up a ramp and lost her footing, fell forward and onto right shoulder. Caused her to fracture nasal bone, proximal humerus. Reports she had loss of consciousness for a short period. Had headache for 4-5 days with ~5/10 level of pain. Balance problems, dizziness, still feels weak and has trouble focusing. No nausea except from the percocet she took for fractures. No prior history of concussion. Feels things do not look 'quite straight' but denies wavy lines, loss of vision. No weakness, numbness in extremities. She had Guillan Barre in 2005 that causes shakiness in arms since then but otherwise has done well. SCAT 3 18/22 symptoms with score 82/132.  11/21: Patient reports she is doing well. Has done 2 visits of cognitive therapy and 4 visits of physical therapy.   No headache currently - last one two days ago. Still slow with concentration, difficulty expressing herself but improved. Has some dizziness, difficulty focusing at times. Waking up at night. No numbness or weakness. SCAT3 11/22 symptoms with score 33/132.  12/5: Patient reports she is improved since last visit. Has been to 5 visits of cognitive therapy, 6 visits of PT/vestibular rehab. Doing home exercises also. Finds the dizziness is the most frustrating but she has trouble with multitasking, concentrating, and gets tired faster. SCAT 3 9/22 symptoms, severity 16/132.  06/15/17: Patient reports headaches improving - last one 3 days ago. Not sleeping great. Feels something behind her eyes - heaviness, blurriness that comes and goes. Difficulty multitasking. Doing cognitive and PT/vestibular rehab but had about 1 week off of these due to holiday. SCAT3 9/22 symptoms, severity 32/132.  1/29: Patient reports she's doing very well. Excellent progress with vestibular therapy -  great report from them, balance improved. She states she walked for 45 minutes the other day and felt an extremely slight 'feeling' behind her eyes. Last headache was weeks ago now. No nausea.   Rare dizziness. SCAT3 0 symptoms now.  2/12: Patient reports she is doing well. She went to work for 2 hour days each day last week, 4 hours the past 2 days. No headaches. She states after the first day she felt stressed and overwhelmed, had to rest but has done well since. Noticed if she's on computer for a while she gets a 'feeling' behind her eyes. Feels better when she switches activities. Driving without any problems. Does notice she asks coworkers to repeat things more. Not taking any medicines.  2/26: Patient reports she's overall doing well. She did have increased fatigue on first day of 6 hour shifts, a couple days where she had a headache as well. SCAT symptom score 5/22, severity 7/132  3/18: Patient reports she is doing well. She has been able to work 8-hour work days with only little fatigue afterwards. She is doing well with interpreting and denies any complaints. She starts to feel tired from the computer she is taking breaks but this is infrequent now. She does not have any dizziness or the feeling she gets behind her eyes. No headaches. No other complaints. Symptoms 0 out of 22 with symptom score of 0 out of 132  5/9: Patient returns reporting problems with multitasking, concentration, and intermittent headaches twice a week. Pain starts in back of her neck and radiates up. She has been interpreting more since last visit and having difficulties especially when both parties are  speaking at the same time. Does ok with phone interpreting as easier to take notes. Worse with stress. Neck can pop and get stuck at times. She feels overwhelmed at work sometimes also. She has history of Guillan Barre. Last seen neurology 1.5 years ago (Dr. Carles Collet) and diagnosed with  medication-induced parkinsonism.   SCAT symptoms 11/22 with severity 24/132.  Past Medical History:  Diagnosis Date  . Anxiety   . Depression   . Guillain Barr syndrome ([place]) 2005  . Osteoporosis   . Squamous cell skin cancer 2011    Current Outpatient Medications on File Prior to Visit  Medication Sig Dispense Refill  . ARIPiprazole (ABILIFY) 5 MG tablet Take 2.5 mg by mouth daily.    . Cholecalciferol (VITAMIN D3) 2000 UNITS CHEW Chew 1 capsule by mouth daily. Reported on 08/29/2015    . clorazepate (TRANXENE) 7.5 MG tablet Take 7.5 mg by mouth 2 (two) times daily.     . Levomilnacipran HCl (FETZIMA PO) Take 1 capsule by mouth daily.    . tamsulosin (FLOMAX) 0.4 MG CAPS capsule Take 0.4 mg by mouth daily.  11   No current facility-administered medications on file prior to visit.     Past Surgical History:  Procedure Laterality Date  . ABDOMINAL HYSTERECTOMY  1989  . APPENDECTOMY  1960  . BREAST SURGERY  1980   biopsy  . WRIST FRACTURE SURGERY Right 2015    Allergies  Allergen Reactions  . Oxycodone-Acetaminophen Nausea And Vomiting  . Cephalexin     Other reaction(s): HIVES    Social History   Socioeconomic History  . Marital status: Widowed    Spouse name: Not on file  . Number of children: Not on file  . Years of education: Not on file  . Highest education level: Not on file  Occupational History  . Not on file  Social Needs  . Financial resource strain: Not hard at all  . Food insecurity:    Worry: Never true    Inability: Never true  . Transportation needs:    Medical: No    Non-medical: No  Tobacco Use  . Smoking status: Former Smoker    Packs/day: 0.20    Types: Cigarettes    Last attempt to quit: 06/14/1977    Years since quitting: 40.3  . Smokeless tobacco: Never Used  Substance and Sexual Activity  . Alcohol use: Yes    Alcohol/week: 0.0 oz    Comment: social (about once a month)  . Drug use: No  . Sexual activity: Never  Lifestyle  .  Physical activity:    Days per week: 0 days    Minutes per session: Not on file  . Stress: Not on file  Relationships  . Social connections:    Talks on phone: Not on file    Gets together: Not on file    Attends religious service: Not on file    Active member of club or organization: Not on file    Attends meetings of clubs or organizations: Not on file    Relationship status: Not on file  . Intimate partner violence:    Fear of current or ex partner: Not on file    Emotionally abused: Not on file    Physically abused: Not on file    Forced sexual activity: Not on file  Other Topics Concern  . Not on file  Social History Narrative  . Not on file    Family History  Problem Relation Age of  Onset  . Cancer Mother   . Mental illness Sister   . Bipolar disorder Sister   . Depression Brother   . Mental illness Brother   . Parkinson's disease Brother   . Anxiety disorder Daughter     BP 118/80   Pulse 78   Ht 5\' 6"  (1.676 m)   Wt 138 lb (62.6 kg)   BMI 22.27 kg/m   Review of Systems: See HPI above.     Objective:  Physical Exam:  Gen: NAD, comfortable in exam room.  Neuro: Alert, orientation 5/5 Immediate memory 14/15 Concentration 5/5 Neck L > R mild paraspinal tenderness but FROM Balance 0 errors double leg, 2 errors single leg, 1 error tandem stance Coordination normal finger to nose Delayed recall 3/5  Assessment & Plan:  1. Concussion with loss of consciousness - Patient reports increased symptoms since returning to more complex translations (in person, behavioral).  We discussed it's unusual for people to worsen after they improve but in her case she wasn't do complex translations at last visit.  She has seen neurology 1.5 years ago and had diagnosis of medication-induced parkinsonism.  On exam she does have masked facies, a slight tremor.  I think it's prudent we assess for complicating medical conditions such as Parkinsons, dementia that could be occurring  concurrently with her postconcussion syndrome.  As such will refer to neurology.  If this is normal I would recommend comprehensive neurocognitive testing.  We discussed work restrictions (see note).  Total visit time 30 minutes - > 50% of which spent on counseling, answering questions.

## 2017-10-24 ENCOUNTER — Telehealth: Payer: Self-pay | Admitting: Internal Medicine

## 2017-10-24 NOTE — Telephone Encounter (Signed)
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 Patient due June 1 first

## 2017-10-28 ENCOUNTER — Other Ambulatory Visit: Payer: Self-pay | Admitting: Orthopedic Surgery

## 2017-10-28 DIAGNOSIS — S42251D Displaced fracture of greater tuberosity of right humerus, subsequent encounter for fracture with routine healing: Secondary | ICD-10-CM

## 2017-10-28 DIAGNOSIS — M25511 Pain in right shoulder: Secondary | ICD-10-CM

## 2017-11-09 ENCOUNTER — Ambulatory Visit
Admission: RE | Admit: 2017-11-09 | Discharge: 2017-11-09 | Disposition: A | Discharge status: Home / Self Care | Payer: PRIVATE HEALTH INSURANCE | Source: Ambulatory Visit | Attending: Orthopedic Surgery | Admitting: Orthopedic Surgery

## 2017-11-09 DIAGNOSIS — S46911A Strain of unspecified muscle, fascia and tendon at shoulder and upper arm level, right arm, initial encounter: Secondary | ICD-10-CM | POA: Diagnosis not present

## 2017-11-09 DIAGNOSIS — X58XXXA Exposure to other specified factors, initial encounter: Secondary | ICD-10-CM | POA: Diagnosis not present

## 2017-11-09 DIAGNOSIS — M25511 Pain in right shoulder: Secondary | ICD-10-CM | POA: Diagnosis not present

## 2017-11-09 DIAGNOSIS — S42251D Displaced fracture of greater tuberosity of right humerus, subsequent encounter for fracture with routine healing: Secondary | ICD-10-CM

## 2017-12-14 ENCOUNTER — Ambulatory Visit (INDEPENDENT_AMBULATORY_CARE_PROVIDER_SITE_OTHER): Payer: Medicare Other

## 2017-12-14 DIAGNOSIS — M810 Age-related osteoporosis without current pathological fracture: Secondary | ICD-10-CM | POA: Diagnosis not present

## 2017-12-14 MED ORDER — DENOSUMAB 60 MG/ML ~~LOC~~ SOSY
60.0000 mg | PREFILLED_SYRINGE | Freq: Once | SUBCUTANEOUS | Status: AC
  Administered 2017-12-14: 60 mg via SUBCUTANEOUS

## 2017-12-14 NOTE — Progress Notes (Addendum)
Patient comes in for Prolia injection.  Injected left  arm subcutaneously .  Patient tolerated injection well.     I have reviewed the above information and agree with above.   Deborra Medina, MD

## 2018-04-08 ENCOUNTER — Emergency Department: Payer: Medicare Other

## 2018-04-08 ENCOUNTER — Encounter: Payer: Self-pay | Admitting: Emergency Medicine

## 2018-04-08 ENCOUNTER — Other Ambulatory Visit: Payer: Self-pay

## 2018-04-08 ENCOUNTER — Emergency Department
Admission: EM | Admit: 2018-04-08 | Discharge: 2018-04-08 | Disposition: A | Discharge status: Home / Self Care | Payer: Medicare Other | Attending: Emergency Medicine | Admitting: Emergency Medicine

## 2018-04-08 DIAGNOSIS — Y939 Activity, unspecified: Secondary | ICD-10-CM | POA: Diagnosis not present

## 2018-04-08 DIAGNOSIS — W108XXA Fall (on) (from) other stairs and steps, initial encounter: Secondary | ICD-10-CM | POA: Insufficient documentation

## 2018-04-08 DIAGNOSIS — Z87891 Personal history of nicotine dependence: Secondary | ICD-10-CM | POA: Diagnosis not present

## 2018-04-08 DIAGNOSIS — S93402A Sprain of unspecified ligament of left ankle, initial encounter: Secondary | ICD-10-CM | POA: Insufficient documentation

## 2018-04-08 DIAGNOSIS — Z79899 Other long term (current) drug therapy: Secondary | ICD-10-CM | POA: Insufficient documentation

## 2018-04-08 DIAGNOSIS — S99912A Unspecified injury of left ankle, initial encounter: Secondary | ICD-10-CM | POA: Diagnosis present

## 2018-04-08 DIAGNOSIS — Z85828 Personal history of other malignant neoplasm of skin: Secondary | ICD-10-CM | POA: Insufficient documentation

## 2018-04-08 DIAGNOSIS — Y929 Unspecified place or not applicable: Secondary | ICD-10-CM | POA: Insufficient documentation

## 2018-04-08 DIAGNOSIS — Y999 Unspecified external cause status: Secondary | ICD-10-CM | POA: Diagnosis not present

## 2018-04-08 MED ORDER — HYDROCODONE-ACETAMINOPHEN 5-325 MG PO TABS: 1.0000 | ORAL_TABLET | Freq: Four times a day (QID) | ORAL | 0 refills | Status: DC | PRN

## 2018-04-08 MED ORDER — MELOXICAM 15 MG PO TABS: 15.0000 mg | ORAL_TABLET | Freq: Every day | ORAL | 2 refills | Status: DC

## 2018-04-08 NOTE — Discharge Instructions (Addendum)
Wear the splint as much as possible.  Toe-touch only and use your walker.  If the swelling decreases and you can bear weight without pain, you may continue without going to the orthopedist.  However at this time you should follow-up with [facility] clinic orthopedics.  Their phone numbers been provided.  Keep the foot elevated and iced as much as possible.  Take the meloxicam daily.  He can also take Tylenol.

## 2018-04-08 NOTE — ED Notes (Signed)
[address]  Going down a step  [address]  And  Turned / twisted  l  Ankle The ankle  Is  Swollen  Pt has some pain on weight  Bearing

## 2018-04-08 NOTE — ED Triage Notes (Signed)
Pt reports she fell while going out the door miss step. Pt talks in complete sentences no distress

## 2018-04-08 NOTE — ED Provider Notes (Signed)
[facility] Emergency Department Provider Note  ____________________________________________   First MD Initiated Contact with Patient 04/08/18 1544     (approximate)  I have reviewed the triage vital signs and the nursing notes.   HISTORY  Chief Complaint Ankle Pain (left)    HPI Holly [name] is a 72 y.o. female presents emergency department after falling down a step.  She states she twisted her ankle.  This happened prior to arrival.  She did not report any other injuries.  She did not hit her head or lose consciousness.  She states it is painful to bear weight.    Past Medical History:  Diagnosis Date  . Anxiety   . Depression   . Guillain Barr syndrome ([place]) 2005  . Osteoporosis   . Squamous cell skin cancer 2011    Patient Active Problem List   Diagnosis Date Noted  . Closed fracture of upper end of humerus 04/25/2017  . Concussion with loss of consciousness 04/10/2017  . 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 Encounter for screening colonoscopy 03/16/2013  . Routine general medical examination at a health care facility 03/16/2013  . Right leg weakness 01/23/2013  . History of Guillain-Barre syndrome 01/23/2013  . Radial fracture 01/23/2013  . 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 UNSPECIFIED VITAMIN D DEFICIENCY 08/26/2008  . Recurrent depressive disorder, current episode moderate ([place]) 07/29/2008  . Fatigue due to depression 07/29/2008  . WEIGHT LOSS 07/29/2008    Past Surgical History:  Procedure Laterality Date  . ABDOMINAL HYSTERECTOMY  1989  . APPENDECTOMY  1960  . BREAST SURGERY  1980   biopsy  . WRIST FRACTURE SURGERY Right 2015    Prior to Admission medications   Medication Sig Start Date End Date Taking? Authorizing Provider  ARIPiprazole (ABILIFY) 5 MG tablet Take 2.5 mg by mouth daily.    [provider]  Cholecalciferol (VITAMIN D3) 2000 UNITS CHEW Chew 1 capsule by mouth daily. Reported on 08/29/2015     [provider]  clorazepate (TRANXENE) 7.5 MG tablet Take 7.5 mg by mouth 2 (two) times daily.     [provider]  Levomilnacipran HCl (FETZIMA PO) Take 1 capsule by mouth daily.    [provider]  meloxicam (MOBIC) 15 MG tablet Take 1 tablet (15 mg total) by mouth daily. 04/08/18 04/08/19  Camdon Saetern, Linden Dolin, PA-C  tamsulosin (FLOMAX) 0.4 MG CAPS capsule Take 0.4 mg by mouth daily. 03/10/17   [provider]    Allergies Oxycodone-acetaminophen and Cephalexin  Family History  Problem Relation Age of Onset  . Cancer Mother   . Mental illness Sister   . Bipolar disorder Sister   . Depression Brother   . Mental illness Brother   . Parkinson's disease Brother   . Anxiety disorder Daughter     Social History Social History   Tobacco Use  . Smoking status: Former Smoker    Packs/day: 0.20    Types: Cigarettes    Last attempt to quit: 06/14/1977    Years since quitting: 40.8  . Smokeless tobacco: Never Used  Substance Use Topics  . Alcohol use: Yes    Alcohol/week: 0.0 standard drinks    Comment: social (about once a month)  . Drug use: No    Review of Systems  Constitutional: No fever/chills Eyes: No visual changes. ENT: No sore throat. Respiratory: Denies cough Genitourinary: Negative for dysuria. Musculoskeletal: Negative for back pain.  Positive for left ankle pain Skin: Negative for rash.  ____________________________________________   PHYSICAL EXAM:  VITAL SIGNS: ED Triage Vitals  Enc Vitals Group     BP 04/08/18 1538 (!) 187/68     Pulse Rate 04/08/18 1538 73     Resp 04/08/18 1538 20     Temp 04/08/18 1538 98.2 F (36.8 C)     Temp Source 04/08/18 1538 Oral     SpO2 04/08/18 1538 100 %     Weight 04/08/18 1540 138 lb (62.6 kg)     Height 04/08/18 1540 5\' 6"  (1.676 m)     Head Circumference --      Peak Flow --      Pain Score 04/08/18 1540 5     Pain Loc --      Pain Edu? --      Excl. in [place]? --      Constitutional: Alert and oriented. Well appearing and in no acute distress. Eyes: Conjunctivae are normal.  Head: Atraumatic. Nose: No congestion/rhinnorhea. Mouth/Throat: Mucous membranes are moist.   Neck:  supple no lymphadenopathy noted Cardiovascular: Normal rate, regular rhythm.  Respiratory: Normal respiratory effort.  No retractionsGU: deferred Musculoskeletal: FROM all extremities, warm and well perfused, the left ankle has a gross amount of swelling at the lateral malleolus.  Neurovascular is intact.  The area is tender to palpation. Neurologic:  Normal speech and language.  Skin:  Skin is warm, dry and intact. No rash noted. Psychiatric: Mood and affect are normal. Speech and behavior are normal.  ____________________________________________   LABS (all labs ordered are listed, but only abnormal results are displayed)  Labs Reviewed - No data to display ____________________________________________   ____________________________________________  RADIOLOGY  X-ray left ankle is negative  ____________________________________________   PROCEDURES  Procedure(s) performed: Posterior OCL applied by the tech  Procedures    ____________________________________________   INITIAL IMPRESSION / ASSESSMENT AND PLAN / ED COURSE  Pertinent labs & imaging results that were available during my care of the patient were reviewed by me and considered in my medical decision making (see chart for details).   Patient 72 year old female presents emergency department complaining of left ankle pain.  She fell down 2 steps and injured herself.  She reports no other injuries at this time.  On physical exam left ankle is grossly swollen and tender at the lateral malleolus.  X-ray of the left ankle is negative for fracture.  Due to the amount of swelling noted, the patient was placed in a posterior OCL.  She has a walker at home and she is to use this and only toe-touch.  If the  swelling reduces on its own and she is able to bear weight without pain she does not need follow-up with orthopedics.  However instructed her to go ahead and call orthopedics to make an appointment as the amount of swelling could mask a small fracture in the distal part of the fibula.  She states she understands and will comply.  She was given a prescription for meloxicam 15 mg daily.  She is discharged in stable condition.     As part of my medical decision making, I reviewed the following data within the [facility] notes reviewed and incorporated, Old chart reviewed, Radiograph reviewed x-ray left ankle is negative for fracture, Notes from prior ED visits and [place] Controlled Substance Database  ____________________________________________   FINAL CLINICAL IMPRESSION(S) / ED DIAGNOSES  Final diagnoses:  Sprain of left ankle, unspecified ligament, initial encounter      NEW MEDICATIONS  STARTED DURING THIS VISIT:  New Prescriptions   MELOXICAM (MOBIC) 15 MG TABLET    Take 1 tablet (15 mg total) by mouth daily.     Note:  This document was prepared using Dragon voice recognition software and may include unintentional dictation errors.    Versie Starks, PA-C 04/08/18 [place], [place], MD 04/08/18 2022

## 2018-07-10 ENCOUNTER — Telehealth: Payer: Self-pay | Admitting: Internal Medicine

## 2018-07-10 NOTE — Telephone Encounter (Signed)
Insurance verification for Prolia filed on [place]. On 06/30/18 Approval received 07/05/18 and patient scheduled for 07/13/18

## 2018-07-13 ENCOUNTER — Ambulatory Visit (INDEPENDENT_AMBULATORY_CARE_PROVIDER_SITE_OTHER): Payer: Medicare Other

## 2018-07-13 DIAGNOSIS — M810 Age-related osteoporosis without current pathological fracture: Secondary | ICD-10-CM | POA: Diagnosis not present

## 2018-07-13 MED ORDER — DENOSUMAB 60 MG/ML ~~LOC~~ SOSY
60.0000 mg | PREFILLED_SYRINGE | Freq: Once | SUBCUTANEOUS | Status: AC
  Administered 2018-07-13: 60 mg via SUBCUTANEOUS

## 2018-07-13 NOTE — Progress Notes (Signed)
Patient came in today for prolia injection in left arm. Patient tolerated well.

## 2018-10-02 ENCOUNTER — Ambulatory Visit: Payer: Self-pay

## 2018-10-19 ENCOUNTER — Encounter: Payer: Self-pay | Admitting: Internal Medicine

## 2018-10-19 ENCOUNTER — Ambulatory Visit: Payer: Self-pay

## 2018-10-22 ENCOUNTER — Emergency Department
Admission: EM | Admit: 2018-10-22 | Discharge: 2018-10-22 | Disposition: A | Discharge status: Home / Self Care | Payer: Medicare Other | Attending: Emergency Medicine | Admitting: Emergency Medicine

## 2018-10-22 ENCOUNTER — Emergency Department: Payer: Medicare Other

## 2018-10-22 ENCOUNTER — Other Ambulatory Visit: Payer: Self-pay

## 2018-10-22 DIAGNOSIS — S0990XA Unspecified injury of head, initial encounter: Secondary | ICD-10-CM | POA: Insufficient documentation

## 2018-10-22 DIAGNOSIS — Y998 Other external cause status: Secondary | ICD-10-CM | POA: Diagnosis not present

## 2018-10-22 DIAGNOSIS — S022XXA Fracture of nasal bones, initial encounter for closed fracture: Secondary | ICD-10-CM | POA: Diagnosis not present

## 2018-10-22 DIAGNOSIS — Y9389 Activity, other specified: Secondary | ICD-10-CM | POA: Insufficient documentation

## 2018-10-22 DIAGNOSIS — Z87891 Personal history of nicotine dependence: Secondary | ICD-10-CM | POA: Diagnosis not present

## 2018-10-22 DIAGNOSIS — W010XXA Fall on same level from slipping, tripping and stumbling without subsequent striking against object, initial encounter: Secondary | ICD-10-CM | POA: Diagnosis not present

## 2018-10-22 DIAGNOSIS — Z79899 Other long term (current) drug therapy: Secondary | ICD-10-CM | POA: Insufficient documentation

## 2018-10-22 DIAGNOSIS — Y92009 Unspecified place in unspecified non-institutional (private) residence as the place of occurrence of the external cause: Secondary | ICD-10-CM | POA: Diagnosis not present

## 2018-10-22 DIAGNOSIS — S0993XA Unspecified injury of face, initial encounter: Secondary | ICD-10-CM | POA: Diagnosis present

## 2018-10-22 DIAGNOSIS — W19XXXA Unspecified fall, initial encounter: Secondary | ICD-10-CM

## 2018-10-22 MED ORDER — TRAMADOL HCL 50 MG PO TABS
50.0000 mg | ORAL_TABLET | Freq: Once | ORAL | Status: AC
  Administered 2018-10-22: 50 mg via ORAL
  Filled 2018-10-22: qty 1

## 2018-10-22 MED ORDER — ONDANSETRON 4 MG PO TBDP
4.0000 mg | ORAL_TABLET | Freq: Once | ORAL | Status: AC
  Administered 2018-10-22: 4 mg via ORAL
  Filled 2018-10-22: qty 1

## 2018-10-22 MED ORDER — SODIUM CHLORIDE 4 MEQ/ML IV SOLN: Freq: Once | ORAL | Status: DC

## 2018-10-22 MED ORDER — OXYMETAZOLINE HCL 0.05 % NA SOLN
1.0000 | Freq: Once | NASAL | Status: AC
  Administered 2018-10-22: 1 via NASAL
  Filled 2018-10-22: qty 30

## 2018-10-22 MED ORDER — LIDOCAINE HCL 1 % IJ SOLN
5.0000 mL | Freq: Once | INTRAMUSCULAR | Status: AC
  Administered 2018-10-22: 19:00:00 5 mL
  Filled 2018-10-22: qty 10

## 2018-10-22 NOTE — ED Triage Notes (Signed)
Pt. States she thinks she tripped on something and fell in her garage. Has fallen in the past per pt. Pt denies losing consciousness, hit nose and Left side of head. Initially with epistaxis, from fall. Bleeding controlled in triage. A&O x4.

## 2018-10-22 NOTE — ED Triage Notes (Signed)
Pt. Also complains of left hip pain as an afterthought.

## 2018-10-22 NOTE — ED Notes (Signed)
First Nurse Note: Pt fell and hit nose and face. Pt denies use of blood thinner. Pt denies LOC. Pt is in NAD at this time.

## 2018-10-22 NOTE — ED Notes (Addendum)
Reports lost her balance  While reaching for garage switch. Fell onto her face. C/o of left side facial pain, left side nare and roots of teeth on left side of face. Now also c/o of pain to left hip. Awaiting eval

## 2018-10-22 NOTE — ED Notes (Signed)
Awaiting ct head.

## 2018-10-22 NOTE — ED Provider Notes (Signed)
[facility] Emergency Department Provider Note  ____________________________________________  Time seen: Approximately 8:52 PM  I have reviewed the triage vital signs and the nursing notes.   HISTORY  Chief Complaint Fall    HPI Holly [name] is a 73 y.o. female presents to the emergency department after a fall that occurred earlier in the day.  Patient reports that she tripped over something in her garage and fell "flat on my face".  Patient did not lose consciousness.  She reports some neck pain and facial pain with some epistaxis from the left nare.  Patient denies numbness or tingling in the upper or lower extremities.  She has had some left hip pain but reports no trouble with ambulation.  She denies chest pain, chest tightness, shortness of breath, nausea, vomiting or abdominal pain.  Patient reports that she had Guillain Barr Syndrome approximately 10 years ago and states that she has some residual right-sided weakness that is baseline for her. No other alleviating measures have been attempted.    Past Medical History:  Diagnosis Date  . Anxiety   . Depression   . Guillain Barr syndrome ([place]) 2005  . Osteoporosis   . Squamous cell skin cancer 2011    Patient Active Problem List   Diagnosis Date Noted  . Closed fracture of upper end of humerus 04/25/2017  . Concussion with loss of consciousness 04/10/2017  . 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 Encounter for screening colonoscopy 03/16/2013  . Routine general medical examination at a health care facility 03/16/2013  . Right leg weakness 01/23/2013  . History of Guillain-Barre syndrome 01/23/2013  . Radial fracture 01/23/2013  . 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 UNSPECIFIED VITAMIN D DEFICIENCY 08/26/2008  . Recurrent depressive disorder, current episode moderate ([place]) 07/29/2008  . Fatigue due to depression 07/29/2008  . WEIGHT LOSS 07/29/2008    Past Surgical History:  Procedure  Laterality Date  . ABDOMINAL HYSTERECTOMY  1989  . APPENDECTOMY  1960  . BREAST SURGERY  1980   biopsy  . WRIST FRACTURE SURGERY Right 2015    Prior to Admission medications   Medication Sig Start Date End Date Taking? Authorizing Provider  ARIPiprazole (ABILIFY) 5 MG tablet Take 2.5 mg by mouth daily.    [provider]  Cholecalciferol (VITAMIN D3) 2000 UNITS CHEW Chew 1 capsule by mouth daily. Reported on 08/29/2015    [provider]  clorazepate (TRANXENE) 7.5 MG tablet Take 7.5 mg by mouth 2 (two) times daily.     [provider]  HYDROcodone-acetaminophen (NORCO/VICODIN) 5-325 MG tablet Take 1 tablet by mouth every 6 (six) hours as needed for moderate pain. 04/08/18   Fisher, Linden Dolin, PA-C  Levomilnacipran HCl (FETZIMA PO) Take 1 capsule by mouth daily.    [provider]  meloxicam (MOBIC) 15 MG tablet Take 1 tablet (15 mg total) by mouth daily. 04/08/18 04/08/19  Fisher, Linden Dolin, PA-C  tamsulosin (FLOMAX) 0.4 MG CAPS capsule Take 0.4 mg by mouth daily. 03/10/17   [provider]    Allergies Oxycodone-acetaminophen and Cephalexin  Family History  Problem Relation Age of Onset  . Cancer Mother   . Mental illness Sister   . Bipolar disorder Sister   . Depression Brother   . Mental illness Brother   . Parkinson's disease Brother   . Anxiety disorder Daughter     Social History Social History   Tobacco Use  . Smoking status: Former Smoker    Packs/day: 0.20  Types: Cigarettes    Last attempt to quit: 06/14/1977    Years since quitting: 41.3  . Smokeless tobacco: Never Used  Substance Use Topics  . Alcohol use: Yes    Alcohol/week: 0.0 standard drinks    Comment: social (about once a month)  . Drug use: No     Review of Systems  Constitutional: No fever/chills Eyes: No visual changes. No discharge ENT: Patient has nasal pain.  Cardiovascular: no chest pain. Respiratory: no cough. No SOB. Gastrointestinal: No  abdominal pain.  No nausea, no vomiting.  No diarrhea.  No constipation. Genitourinary: Negative for dysuria. No hematuria Musculoskeletal: Patient has neck pain and left hip pain.  Skin: Negative for rash, abrasions, lacerations, ecchymosis. Neurological: Negative for headaches, focal weakness or numbness.   ____________________________________________   PHYSICAL EXAM:  VITAL SIGNS: ED Triage Vitals  Enc Vitals Group     BP 10/22/18 1444 (!) 178/90     Pulse Rate 10/22/18 1444 93     Resp 10/22/18 1444 20     Temp 10/22/18 1444 97.6 F (36.4 C)     Temp Source 10/22/18 1444 Oral     SpO2 10/22/18 1444 100 %     Weight 10/22/18 1446 140 lb (63.5 kg)     Height 10/22/18 1446 5\' 6"  (1.676 m)     Head Circumference --      Peak Flow --      Pain Score 10/22/18 1445 4     Pain Loc --      Pain Edu? --      Excl. in [place]? --      Constitutional: Alert and oriented. Well appearing and in no acute distress. Eyes: Conjunctivae are normal. PERRL. EOMI. Head: Atraumatic. ENT:      Ears: TMs are pearly.       Nose: No congestion/rhinnorhea.  Patient has trace bleeding at left nare.      Mouth/Throat: Mucous membranes are moist.  Neck: No stridor. Full range of motion. No midline c spine tenderness.  Cardiovascular: Normal rate, regular rhythm. Normal S1 and S2.  Good peripheral circulation. Respiratory: Normal respiratory effort without tachypnea or retractions. Lungs CTAB. Good air entry to the bases with no decreased or absent breath sounds. Gastrointestinal: Bowel sounds 4 quadrants. Soft and nontender to palpation. No guarding or rigidity. No palpable masses. No distention. No CVA tenderness. Musculoskeletal: Full range of motion to all extremities. No gross deformities appreciated.  Patient has 5 out of 5 strength in the upper and lower extremities. Neurologic:  Normal speech and language. No gross focal neurologic deficits are appreciated.  Skin:  Skin is warm, dry and intact.  No rash noted. Psychiatric: Mood and affect are normal. Speech and behavior are normal. Patient exhibits appropriate insight and judgement.   ____________________________________________   LABS (all labs ordered are listed, but only abnormal results are displayed)  Labs Reviewed - No data to display ____________________________________________  EKG   ____________________________________________  RADIOLOGY I personally viewed and evaluated these images as part of my medical decision making, as well as reviewing the written report by the radiologist  Ct Head Wo Contrast  Result Date: 10/22/2018 CLINICAL DATA:  Fall EXAM: CT HEAD WITHOUT CONTRAST CT MAXILLOFACIAL WITHOUT CONTRAST CT CERVICAL SPINE WITHOUT CONTRAST TECHNIQUE: Multidetector CT imaging of the head, cervical spine, and maxillofacial structures were performed using the standard protocol without intravenous contrast. Multiplanar CT image reconstructions of the cervical spine and maxillofacial structures were also generated. COMPARISON:  03/26/2017 FINDINGS:  CT HEAD FINDINGS Brain: No evidence of acute infarction, hemorrhage, hydrocephalus, extra-axial collection or mass lesion/mass effect. Vascular: No hyperdense vessel or unexpected calcification. CT FACIAL BONES FINDINGS Skull: Normal. Negative for fracture or focal lesion. Facial bones: Minimally displaced fractures of the nasal bones. No other displaced fractures or dislocations. Sinuses/Orbits: No acute finding. Other: None. CT CERVICAL SPINE FINDINGS Alignment: Normal. Skull base and vertebrae: No acute fracture. No primary bone lesion or focal pathologic process. Soft tissues and spinal canal: No prevertebral fluid or swelling. No visible canal hematoma. Disc levels: Mild to moderate multilevel disc space height loss and osteophytosis. Upper chest: Negative. Other: None. IMPRESSION: 1.  No acute intracranial pathology. 2. Minimally displaced fractures of the nasal bones. No other  displaced fractures or dislocations of the facial bones. 3.  No fracture or static subluxation of the cervical spine. Electronically Signed   By: Eddie Candle M.D.   On: 10/22/2018 16:52   Ct Cervical Spine Wo Contrast  Result Date: 10/22/2018 CLINICAL DATA:  Fall EXAM: CT HEAD WITHOUT CONTRAST CT MAXILLOFACIAL WITHOUT CONTRAST CT CERVICAL SPINE WITHOUT CONTRAST TECHNIQUE: Multidetector CT imaging of the head, cervical spine, and maxillofacial structures were performed using the standard protocol without intravenous contrast. Multiplanar CT image reconstructions of the cervical spine and maxillofacial structures were also generated. COMPARISON:  03/26/2017 FINDINGS: CT HEAD FINDINGS Brain: No evidence of acute infarction, hemorrhage, hydrocephalus, extra-axial collection or mass lesion/mass effect. Vascular: No hyperdense vessel or unexpected calcification. CT FACIAL BONES FINDINGS Skull: Normal. Negative for fracture or focal lesion. Facial bones: Minimally displaced fractures of the nasal bones. No other displaced fractures or dislocations. Sinuses/Orbits: No acute finding. Other: None. CT CERVICAL SPINE FINDINGS Alignment: Normal. Skull base and vertebrae: No acute fracture. No primary bone lesion or focal pathologic process. Soft tissues and spinal canal: No prevertebral fluid or swelling. No visible canal hematoma. Disc levels: Mild to moderate multilevel disc space height loss and osteophytosis. Upper chest: Negative. Other: None. IMPRESSION: 1.  No acute intracranial pathology. 2. Minimally displaced fractures of the nasal bones. No other displaced fractures or dislocations of the facial bones. 3.  No fracture or static subluxation of the cervical spine. Electronically Signed   By: Eddie Candle M.D.   On: 10/22/2018 16:52   Dg Hip Unilat W Or Wo Pelvis 2-3 Views Left  Result Date: 10/22/2018 CLINICAL DATA:  Trip and fall with left hip pain, initial encounter EXAM: DG HIP (WITH OR WITHOUT PELVIS) 3V  LEFT COMPARISON:  None. FINDINGS: There is no evidence of hip fracture or dislocation. There is no evidence of arthropathy or other focal bone abnormality. IMPRESSION: No acute abnormality noted. Electronically Signed   By: Inez Catalina M.D.   On: 10/22/2018 16:58   Ct Maxillofacial Wo Contrast  Result Date: 10/22/2018 CLINICAL DATA:  Fall EXAM: CT HEAD WITHOUT CONTRAST CT MAXILLOFACIAL WITHOUT CONTRAST CT CERVICAL SPINE WITHOUT CONTRAST TECHNIQUE: Multidetector CT imaging of the head, cervical spine, and maxillofacial structures were performed using the standard protocol without intravenous contrast. Multiplanar CT image reconstructions of the cervical spine and maxillofacial structures were also generated. COMPARISON:  03/26/2017 FINDINGS: CT HEAD FINDINGS Brain: No evidence of acute infarction, hemorrhage, hydrocephalus, extra-axial collection or mass lesion/mass effect. Vascular: No hyperdense vessel or unexpected calcification. CT FACIAL BONES FINDINGS Skull: Normal. Negative for fracture or focal lesion. Facial bones: Minimally displaced fractures of the nasal bones. No other displaced fractures or dislocations. Sinuses/Orbits: No acute finding. Other: None. CT CERVICAL SPINE FINDINGS Alignment: Normal. Skull base  and vertebrae: No acute fracture. No primary bone lesion or focal pathologic process. Soft tissues and spinal canal: No prevertebral fluid or swelling. No visible canal hematoma. Disc levels: Mild to moderate multilevel disc space height loss and osteophytosis. Upper chest: Negative. Other: None. IMPRESSION: 1.  No acute intracranial pathology. 2. Minimally displaced fractures of the nasal bones. No other displaced fractures or dislocations of the facial bones. 3.  No fracture or static subluxation of the cervical spine. Electronically Signed   By: Eddie Candle M.D.   On: 10/22/2018 16:52    ____________________________________________    PROCEDURES  Procedure(s) performed:     Procedures    Medications  traMADol (ULTRAM) tablet 50 mg (50 mg Oral Given 10/22/18 1822)  ondansetron (ZOFRAN-ODT) disintegrating tablet 4 mg (4 mg Oral Given 10/22/18 1823)  oxymetazoline (AFRIN) 0.05 % nasal spray 1 spray (1 spray Each Nare Given 10/22/18 1849)  lidocaine (XYLOCAINE) 1 % (with pres) injection 5 mL (5 mLs Infiltration Given 10/22/18 1848)     ____________________________________________   INITIAL IMPRESSION / ASSESSMENT AND PLAN / ED COURSE  Pertinent labs & imaging results that were available during my care of the patient were reviewed by me and considered in my medical decision making (see chart for details).  Review of the [place] CSRS was performed in accordance of the [place] prior to dispensing any controlled drugs.      Assessment and plan Fall 58-year-old female presents to the emergency department after a fall that occurred in her garage earlier in the day.  On physical exam, neuro exam was reassuring.  Patient had trace blood at left nare.  Differential diagnosis included subdural hematoma, subarachnoid hemorrhage, skull fracture, nasal bone fracture, facial fracture and C-spine fracture.  CT head and CT cervical spine revealed no acute abnormality.  CT maxillofacial revealed nondisplaced nasal bone fractures.  Epistaxis resolved in the emergency department using a combination of Afrin and lidocaine.  Patient was advised to follow-up with otolaryngology.  Tramadol was given in the emergency department for pain.  All patient questions were answered.    ____________________________________________  FINAL CLINICAL IMPRESSION(S) / ED DIAGNOSES  Final diagnoses:  Fall, initial encounter  Closed fracture of nasal bone, initial encounter      NEW MEDICATIONS STARTED DURING THIS VISIT:  ED Discharge Orders    None          This chart was dictated using voice recognition software/Dragon. Despite best efforts to proofread, errors can occur which can  change the meaning. Any change was purely unintentional.    Lannie Fields, PA-C 10/22/18 2058    Arta Silence, MD 10/22/18 [phone]

## 2018-11-14 ENCOUNTER — Other Ambulatory Visit: Payer: Self-pay

## 2018-11-20 ENCOUNTER — Ambulatory Visit: Payer: Self-pay

## 2018-11-20 NOTE — Telephone Encounter (Signed)
SHE SHOULD NOT COME IN FOR PHYSICAL TOMORROW.  AGREE WITH VIRTUAL SHE NEEDS TO ISOLATE FOR 14 DAYS

## 2018-11-20 NOTE — Telephone Encounter (Signed)
Incoming call from Pt. Reporting she has been exposed to some one who has tested positive for covid-19.   Pt has a appointment for a physical tommorow.  Trans ferred call to office Wants to know if she should  still come.  Trans ferred call to office to reschule.     Reason for Disposition . [1] COVID-19 EXPOSURE (Close Contact) within last 14 days AND [2] needs COVID-19 lab test to return to work AND [3] NO symptoms  Answer Assessment - Initial Assessment Questions 1. CLOSE CONTACT: "Who is the person with the confirmed or suspected COVID-19 infection that you were exposed to?"    Close friend 2. PLACE of CONTACT: "Where were you when you were exposed to COVID-19?" (e.g., home, school, medical waiting room; which city?)     [place] 3. TYPE of CONTACT: "How much contact was there?" (e.g., sitting next to, live in same house, work in same office, same building)     around 73min to hour 4. DURATION of CONTACT: "How long were you in contact with the COVID-19 patient?" (e.g., a few seconds, passed by person, a few minutes, live with the patient)      5. DATE of CONTACT: "When did you have contact with a COVID-19 patient?" (e.g., how many days ago)     Wednesday 6. TRAVEL: "Have you traveled out of the country recently?" If so, "When and where?"     * Also ask about out-of-state travel, since the CDC has identified some high-risk cities for community spread in the [place].     * Note: Travel becomes less relevant if there is widespread community transmission where the patient lives.    denies 7. COMMUNITY SPREAD: "Are there lots of cases of COVID-19 (community spread) where you live?" (See public health department website, if unsure)       unknown 8. SYMPTOMS: "Do you have any symptoms?" (e.g., fever, cough, breathing difficulty)     Runny nose sneezin relates it to allergies 9. PREGNANCY OR POSTPARTUM: "Is there any chance you are pregnant?" "When was your last menstrual period?" "Did you deliver  in the last 2 weeks?"     na 10. HIGH RISK: "Do you have any heart or lung problems? Do you have a weak immune system?" (e.g., CHF, COPD, asthma, HIV positive, chemotherapy, renal failure, diabetes mellitus, sickle cell anemia)       History of guillain barre dx.  Protocols used: CORONAVIRUS (COVID-19) EXPOSURE-A-AH

## 2018-11-20 NOTE — Telephone Encounter (Signed)
Patient called nurse line and staed she has been exposed to a patient that tested positive for COVID, she has an extremely runny nose but no other symptoms had CPE scheduled for 11/21/18 changed to virtual and advised of COVID protocol. To self quarantine and to monitor symptoms, any changes .

## 2018-11-21 ENCOUNTER — Encounter: Payer: Self-pay | Admitting: Internal Medicine

## 2018-11-21 ENCOUNTER — Ambulatory Visit (INDEPENDENT_AMBULATORY_CARE_PROVIDER_SITE_OTHER): Payer: Medicare Other

## 2018-11-21 ENCOUNTER — Ambulatory Visit (INDEPENDENT_AMBULATORY_CARE_PROVIDER_SITE_OTHER): Payer: Medicare Other | Admitting: Internal Medicine

## 2018-11-21 ENCOUNTER — Other Ambulatory Visit: Payer: Self-pay

## 2018-11-21 ENCOUNTER — Telehealth: Payer: Self-pay | Admitting: Hematology

## 2018-11-21 DIAGNOSIS — Z0000 Encounter for general adult medical examination without abnormal findings: Secondary | ICD-10-CM | POA: Diagnosis not present

## 2018-11-21 DIAGNOSIS — H6991 Unspecified Eustachian tube disorder, right ear: Secondary | ICD-10-CM | POA: Diagnosis not present

## 2018-11-21 DIAGNOSIS — Z20828 Contact with and (suspected) exposure to other viral communicable diseases: Secondary | ICD-10-CM | POA: Diagnosis not present

## 2018-11-21 DIAGNOSIS — Z20822 Contact with and (suspected) exposure to covid-19: Secondary | ICD-10-CM

## 2018-11-21 DIAGNOSIS — F5081 Binge eating disorder, mild: Secondary | ICD-10-CM

## 2018-11-21 DIAGNOSIS — F331 Major depressive disorder, recurrent, moderate: Secondary | ICD-10-CM

## 2018-11-21 MED ORDER — FLUTICASONE PROPIONATE 50 MCG/ACT NA SUSP: 2.0000 | Freq: Every day | NASAL | 6 refills | Status: DC

## 2018-11-21 MED ORDER — ARIPIPRAZOLE 2 MG PO TABS: 4.0000 mg | ORAL_TABLET | Freq: Every day | ORAL | 1 refills | Status: DC

## 2018-11-21 NOTE — Progress Notes (Signed)
Virtual Visit converted to telephone  Note  This visit type was conducted due to national recommendations for restrictions regarding the COVID-19 pandemic (e.g. social distancing).  This format is felt to be most appropriate for this patient at this time.  All issues noted in this document were discussed and addressed.  No physical exam was performed (except for noted visual exam findings with Video Visits).   I attempted to connect with@ on 11/22/18 at  3:00 PM EDT by a video enabled telemedicine application ; however  Interactive audio and video telecommunications  Ultimately failed, due to patient having technical difficulties.   We continued and completed visit with audio only. I and verified that I am speaking with the correct person using two identifiers.  Location patient: home Location provider: work or home office Persons participating in the virtual visit: patient, provider  I discussed the limitations, risks, security and privacy concerns of performing an evaluation and management service by telephone and the availability of in person appointments. I also discussed with the patient that there may be a patient responsible charge related to this service. The patient expressed understanding and agreed to proceed.  Reason for visit: follow upon multiple issues  HPI:  1) worsening depression;  Aggravated by isolation of retirement complicated by the COVID epidemic.Marland Kitchen  Has very poor self esteem,  Started in childhood, due to family attitudes and  dynamics, Has considered hurting herself but "would never act on her impulses due to daughter's dependence on her  Seeing Dr Casimiro Needle but has not been able to reach him.  Medications reviewed.  A total of 40 minutes was spent with patient more than half of which was spent in counseling patient on the above mentioned issues .  History of 2 falls since last visit.  reviewed circumstances and ER workup .  2 concussions in the past year.  Speech slowed but  articulate today    She has had a potential COVID Exposure and would like to be tested .  right ear feels plugged.  Not painful  Occasional sinus congestion   ROS: See pertinent positives and negatives per HPI.  Past Medical History:  Diagnosis Date  . Anxiety   . Depression   . Guillain Barr syndrome ([place]) 2005  . Osteoporosis   . Squamous cell skin cancer 2011    Past Surgical History:  Procedure Laterality Date  . ABDOMINAL HYSTERECTOMY  1989  . APPENDECTOMY  1960  . BREAST SURGERY  1980   biopsy  . WRIST FRACTURE SURGERY Right 2015    Family History  Problem Relation Age of Onset  . Cancer Mother   . Mental illness Sister   . Bipolar disorder Sister   . Depression Brother   . Mental illness Brother   . Parkinson's disease Brother   . Dystonia Brother   . Anxiety disorder Daughter     SOCIAL HX: single,  Lives with daughte,r  IADL   Current Outpatient Medications:  .  ARIPiprazole (ABILIFY) 2 MG tablet, , Disp: , Rfl:  .  Cholecalciferol (VITAMIN D3) 2000 UNITS CHEW, Chew 1 capsule by mouth daily. Reported on 08/29/2015, Disp: , Rfl:  .  clorazepate (TRANXENE) 7.5 MG tablet, Take 7.5 mg by mouth 3 (three) times daily. , Disp: , Rfl:  .  HYDROcodone-acetaminophen (NORCO/VICODIN) 5-325 MG tablet, Take 1 tablet by mouth every 6 (six) hours as needed for moderate pain., Disp: 15 tablet, Rfl: 0 .  Levomilnacipran HCl (FETZIMA PO), Take 1 capsule by  mouth daily. 40mg , Disp: , Rfl:  .  meloxicam (MOBIC) 15 MG tablet, Take 1 tablet (15 mg total) by mouth daily., Disp: 30 tablet, Rfl: 2 .  tamsulosin (FLOMAX) 0.4 MG CAPS capsule, Take 0.4 mg by mouth daily., Disp: , Rfl: 11 .  ARIPiprazole (ABILIFY) 2 MG tablet, Take 2 tablets (4 mg total) by mouth daily., Disp: 60 tablet, Rfl: 1 .  fluticasone (FLONASE) 50 MCG/ACT nasal spray, Place 2 sprays into both nostrils daily., Disp: 16 g, Rfl: 6  EXAM:   General impression: alert, cooperative and articulate.  No signs of  being in distress  Lungs: speech is fluent sentence length suggests that patient is not short of breath and not punctuated by cough, sneezing or sniffing. Marland Kitchen   Psych: affect flat, depressed.  speech is articulate and non pressured .  Denies suicidal thoughts currently.    ASSESSMENT AND PLAN:  Discussed the following assessment and plan:  Recurrent depressive disorder, current episode moderate (HCC)  Exposure to Covid-19 Virus  Mild binge-eating disorder  Recurrent depressive disorder, current episode moderate Increasing abilify to 4 mg daily and encouraged patient to contact Dr Casimiro Needle  .RTC one week   Mild binge-eating disorder Relatively new way of coping with her low self esteem and anxiety.  She is seeing z therapist Mickel Fuchs      I discussed the assessment and treatment plan with the patient. The patient was provided an opportunity to ask questions and all were answered. The patient agreed with the plan and demonstrated an understanding of the instructions.   The patient was advised to call back or seek an in-person evaluation if the symptoms worsen or if the condition fails to improve as anticipated.  I provided 60 minutes of non-face-to-face time during this encounter.   Crecencio Mc, MD

## 2018-11-21 NOTE — Patient Instructions (Signed)
I am increasing your abilify to 4 mg daily  For your ear  Start using Flonase  Twice daily,  Followed by Afrin every 12 hours,  And sudafed PE every 6 hours  YOU WILL BE CALLED WITH THE APPOINTMENT FOR COVID 19 TESTING   I invite you to visit my church ([employer] on [address] in [place]

## 2018-11-21 NOTE — Progress Notes (Addendum)
Subjective:   Holly [name] is a 73 y.o. female who presents for Medicare Annual (Subsequent) preventive examination.  Review of Systems:  No ROS.  Medicare Wellness Virtual Visit.  Visual/audio telehealth visit, UTA vital signs.   See social history for additional risk factors.  Cardiac Risk Factors include: advanced age (>47men, >77 women);hypertension     Objective:     Vitals: There were no vitals taken for this visit.  There is no height or weight on file to calculate BMI.  Advanced Directives 11/21/2018 10/22/2018 04/08/2018 09/29/2017 05/24/2017 04/15/2017 03/26/2017  Does Patient Have a Medical Advance Directive? Yes Yes Yes Yes Yes Yes Yes  Type of Paramedic of [place];Living will [place];Living will Healthcare Power of [place];Living will Living will Living will Living will  Does patient want to make changes to medical advance directive? No - Patient declined - - No - Patient declined - - -  Copy of [place] in Chart? No - copy requested No - copy requested - No - copy requested - - -  Would patient like information on creating a medical advance directive? - - No - Patient declined - - - No - Patient declined    Tobacco Social History   Tobacco Use  Smoking Status Former Smoker  . Packs/day: 0.20  . Types: Cigarettes  . Last attempt to quit: 06/14/1977  . Years since quitting: 41.4  Smokeless Tobacco Never Used     Counseling given: Not Answered   Clinical Intake:  Pre-visit preparation completed: Yes        Diabetes: No  How often do you need to have someone help you when you read instructions, pamphlets, or other written materials from your doctor or pharmacy?: 1 - Never  Interpreter Needed?: No     Past Medical History:  Diagnosis Date  . Anxiety   . Depression   . Guillain Barr syndrome ([place]) 2005  . Osteoporosis   . Squamous cell skin cancer 2011    Past Surgical History:  Procedure Laterality Date  . ABDOMINAL HYSTERECTOMY  1989  . APPENDECTOMY  1960  . BREAST SURGERY  1980   biopsy  . WRIST FRACTURE SURGERY Right 2015   Family History  Problem Relation Age of Onset  . Cancer Mother   . Mental illness Sister   . Bipolar disorder Sister   . Depression Brother   . Mental illness Brother   . Parkinson's disease Brother   . Dystonia Brother   . Anxiety disorder Daughter    Social History   Socioeconomic History  . Marital status: Widowed    Spouse name: Not on file  . Number of children: Not on file  . Years of education: Not on file  . Highest education level: Not on file  Occupational History  . Not on file  Social Needs  . Financial resource strain: Not hard at all  . Food insecurity:    Worry: Never true    Inability: Never true  . Transportation needs:    Medical: No    Non-medical: No  Tobacco Use  . Smoking status: Former Smoker    Packs/day: 0.20    Types: Cigarettes    Last attempt to quit: 06/14/1977    Years since quitting: 41.4  . Smokeless tobacco: Never Used  Substance and Sexual Activity  . Alcohol use: Never    Alcohol/week: 0.0 standard drinks    Frequency: Never  Comment: social (about once a month)  . Drug use: No  . Sexual activity: Never  Lifestyle  . Physical activity:    Days per week: 0 days    Minutes per session: Not on file  . Stress: Very much  Relationships  . Social connections:    Talks on phone: Not on file    Gets together: Not on file    Attends religious service: Not on file    Active member of club or organization: Not on file    Attends meetings of clubs or organizations: Not on file    Relationship status: Not on file  Other Topics Concern  . Not on file  Social History Narrative  . Not on file    Outpatient Encounter Medications as of 11/21/2018  Medication Sig  . Cholecalciferol (VITAMIN D3) 2000 UNITS CHEW Chew 1 capsule by mouth daily. Reported on  08/29/2015  . clorazepate (TRANXENE) 7.5 MG tablet Take 7.5 mg by mouth 3 (three) times daily.   Marland Kitchen HYDROcodone-acetaminophen (NORCO/VICODIN) 5-325 MG tablet Take 1 tablet by mouth every 6 (six) hours as needed for moderate pain.  . Levomilnacipran HCl (FETZIMA PO) Take 1 capsule by mouth daily. 40mg   . meloxicam (MOBIC) 15 MG tablet Take 1 tablet (15 mg total) by mouth daily.  . tamsulosin (FLOMAX) 0.4 MG CAPS capsule Take 0.4 mg by mouth daily.  . [DISCONTINUED] ARIPiprazole (ABILIFY) 5 MG tablet Take 2.5 mg by mouth daily. Taking 2mg  daily  . ARIPiprazole (ABILIFY) 2 MG tablet    No facility-administered encounter medications on file as of 11/21/2018.     Activities of Daily Living In your present state of health, do you have any difficulty performing the following activities: 11/21/2018  Hearing? N  Vision? N  Difficulty concentrating or making decisions? Y  Walking or climbing stairs? Y  Dressing or bathing? N  Doing errands, shopping? N  Preparing Food and eating ? N  Using the Toilet? N  In the past six months, have you accidently leaked urine? N  Do you have problems with loss of bowel control? N  Managing your Medications? N  Managing your Finances? N  Housekeeping or managing your Housekeeping? N  Some recent data might be hidden    Patient Care Team: Crecencio Mc, MD as PCP - General (Internal Medicine)    Assessment:   This is a routine wellness examination for [place].   I connected with patient 11/21/18 at  2:30 PM EDT by a video/audio enabled telemedicine application and verified that I am speaking with the correct person using two identifiers. Patient stated full name and DOB. Patient gave permission to continue with virtual visit. Patient's location was at home and Nurse's location was at [place] office.   Health Screenings  Mammogram - 09/2017 Bone Density - 06/2013 Glaucoma -none Hearing -demonstrates normal hearing during visit. Dental- visits every 4 months  Vision- UTD  Social  Alcohol intake - no         Smoking history- former   Smokers in home? none Illicit drug use? none Exercise - none Diet - regular Sexually Active -not currently BMI- discussed the importance of a healthy diet, water intake and the benefits of aerobic exercise.  Educational material provided.   Safety  Patient feels safe at home- yes Her daughter lives with her.  Patient does have smoke detectors at home- yes Patient does wear sunscreen or protective clothing when in direct sunlight -yes Patient does wear seat belt when  in a moving vehicle -yes Patient drives- yes  [id number] precautions and sickness symptoms discussed.   Activities of Daily Living Patient denies needing assistance with: driving, household chores, feeding themselves, getting from bed to chair, getting to the toilet, bathing/showering, dressing, managing money, or preparing meals.  No new identified risk were noted.    Depression Screen Patient is currently in counseling every 2 weeks. Today she notes she has been binging and purging for several weeks. She has thoughts of harming herself but will not because her daughter lives with her. She has no motivation most days ;sleeps about 4 hours, tearful and today, Deferred to pcp for follow up. Same day appointment with pcp  Medication-taking as directed and without issues.   Fall Screen Patient is afraid of falling and has had 2 falls since Mother's Day. She has a fear of falling and becomes anxious. She does not ambulate with a cane or walker. Deferred to pcp for follow up. Same day appointment today.  Memory Screen Unable to assess.   Immunizations The following Immunizations were discussed: Influenza, shingles, pneumonia, and tetanus.   Other Providers Patient Care Team: Crecencio Mc, MD as PCP - General (Internal Medicine)  Exercise Activities and Dietary recommendations Current Exercise Habits: The patient does not participate in  regular exercise at present  Goals      Patient Stated   . Follow up with Primary Care Provider (pt-stated)     Follow up with her counselor as directed    . Increase physical activity (pt-stated)     As tolerated       Fall Risk Fall Risk  11/21/2018 10/14/2017 09/29/2017 03/26/2016 09/15/2015  Falls in the past year? 1 No Yes No No  Number falls in past yr: 1 - 1 - -  Injury with Fall? 1 - Yes - -  Comment Sought medical care - Medical care recieved.  Followed by Neuro and PCP. - -  Risk Factor Category  - - - - -  Risk for fall due to : - - - - -  Risk for fall due to: Comment - - - - -  Follow up - - - - -   Depression Screen PHQ 2/9 Scores 11/21/2018 10/14/2017 09/29/2017 09/15/2015  PHQ - 2 Score 6 2 - 3  PHQ- 9 Score 23 11 - 11  Exception Documentation - - Other- indicate reason in comment box -  Not completed - - Followed by therapist Sheran Luz bi-weekly, and psychiatrist Dr. Casimiro Needle every 5 months -     Cognitive Function MMSE - [place] Exam 11/21/2018 09/29/2017 09/15/2015  Not completed: Unable to complete Unable to complete -  Orientation to time - - 5  Orientation to Place - - 5  Registration - - 3  Attention/ Calculation - - 5  Recall - - 3  Language- name 2 objects - - 2  Language- repeat - - 1  Language- follow 3 step command - - 3  Language- read & follow direction - - 1  Write a sentence - - 1  Copy design - - 1  Total score - - 30        Immunization History  Administered Date(s) Administered  . Td 06/14/2006   Screening Tests Health Maintenance  Topic Date Due  . TETANUS/TDAP  06/14/2016  . Fecal DNA (Cologuard)  07/16/2018  . MAMMOGRAM  10/08/2019  . DEXA SCAN  Completed  . Hepatitis C Screening  Completed  Plan:    End of life planning; Advance aging; Advanced directives discussed.  Copy of current HCPOA/Living Will requested.    I have personally reviewed and noted the following in the patient's chart:   . Medical and  social history . Use of alcohol, tobacco or illicit drugs  . Current medications and supplements . Functional ability and status . Nutritional status . Physical activity . Advanced directives . List of other physicians . Hospitalizations, surgeries, and ER visits in previous 12 months . Vitals . Screenings to include cognitive, depression, and falls . Referrals and appointments  In addition, I have reviewed and discussed with patient certain preventive protocols, quality metrics, and best practice recommendations. A written personalized care plan for preventive services as well as general preventive health recommendations were provided to patient.     OBrien-Blaney, Marquelle Musgrave L, LPN  07/19/8344    I have reviewed the above information and agree with above.   Deborra Medina, MD

## 2018-11-21 NOTE — Patient Instructions (Addendum)
  Holly [name] , Thank you for taking time to come for your Medicare Wellness Visit. I appreciate your ongoing commitment to your health goals. Please review the following plan we discussed and let me know if I can assist you in the future.   These are the goals we discussed: Goals      Patient Stated   . Follow up with Primary Care Provider (pt-stated)     Follow up with her counselor as directed    . Increase physical activity (pt-stated)     As tolerated.        This is a list of the screening recommended for you and due dates:  Health Maintenance  Topic Date Due  . Tetanus Vaccine  06/14/2016  . Cologuard (Stool DNA test)  07/16/2018  . Mammogram  10/08/2019  . DEXA scan (bone density measurement)  Completed  .  Hepatitis C: One time screening is recommended by Center for Disease Control  (CDC) for  adults born from [birth year] through 1963.   Completed

## 2018-11-21 NOTE — Telephone Encounter (Signed)
Orders placed for testing and patient scheduled.

## 2018-11-21 NOTE — Telephone Encounter (Signed)
-----   Message from Adair Laundry, [place] sent at 11/21/2018  3:54 PM EDT ----- Needing covid-19 testing. Pt has had exposure to a pt with positive covid-19.

## 2018-11-22 ENCOUNTER — Other Ambulatory Visit: Payer: Self-pay

## 2018-11-22 DIAGNOSIS — H6991 Unspecified Eustachian tube disorder, right ear: Secondary | ICD-10-CM | POA: Insufficient documentation

## 2018-11-22 DIAGNOSIS — F5081 Binge eating disorder: Secondary | ICD-10-CM | POA: Insufficient documentation

## 2018-11-22 DIAGNOSIS — Z20822 Contact with and (suspected) exposure to covid-19: Secondary | ICD-10-CM | POA: Insufficient documentation

## 2018-11-22 DIAGNOSIS — Z20828 Contact with and (suspected) exposure to other viral communicable diseases: Secondary | ICD-10-CM | POA: Insufficient documentation

## 2018-11-22 NOTE — Assessment & Plan Note (Signed)
Relatively new way of coping with her low self esteem and anxiety.  She is seeing z therapist Mickel Fuchs

## 2018-11-22 NOTE — Assessment & Plan Note (Signed)
adding fluticasone nasal spray

## 2018-11-22 NOTE — Assessment & Plan Note (Signed)
Increasing abilify to 4 mg daily and encouraged patient to contact Dr Casimiro Needle  .RTC one week

## 2018-11-23 LAB — NOVEL CORONAVIRUS, NAA: SARS-CoV-2, NAA: NOT DETECTED

## 2018-11-29 ENCOUNTER — Ambulatory Visit (INDEPENDENT_AMBULATORY_CARE_PROVIDER_SITE_OTHER): Payer: Medicare Other | Admitting: Internal Medicine

## 2018-11-29 DIAGNOSIS — F331 Major depressive disorder, recurrent, moderate: Secondary | ICD-10-CM | POA: Diagnosis not present

## 2018-11-29 DIAGNOSIS — H6991 Unspecified Eustachian tube disorder, right ear: Secondary | ICD-10-CM

## 2018-11-29 DIAGNOSIS — M810 Age-related osteoporosis without current pathological fracture: Secondary | ICD-10-CM | POA: Diagnosis not present

## 2018-11-29 DIAGNOSIS — Z20822 Contact with and (suspected) exposure to covid-19: Secondary | ICD-10-CM

## 2018-11-29 DIAGNOSIS — Z20828 Contact with and (suspected) exposure to other viral communicable diseases: Secondary | ICD-10-CM

## 2018-11-29 NOTE — Progress Notes (Signed)
Telephone  Note  This visit type was conducted due to national recommendations for restrictions regarding the COVID-19 pandemic (e.g. social distancing).  This format is felt to be most appropriate for this patient at this time.  All issues noted in this document were discussed and addressed.  No physical exam was performed (except for noted visual exam findings with Video Visits).   I connected with@ on 11/30/18 at  3:30 PM EDT by a  telephone and verified that I am speaking with the correct person using two identifiers. Location patient: home Location provider: work or home office Persons participating in the virtual visit: patient, provider  I discussed the limitations, risks, security and privacy concerns of performing an evaluation and management service by telephone and the availability of in person appointments. I also discussed with the patient that there may be a patient responsible charge related to this service. The patient expressed understanding and agreed to proceed.  Reason for visit: follow up on depression and sinus ear fullness  HPI:  Right ear pressure persistent. Accompanied by sinus congestion and runny nose clear drainage since her fall with facial trauma on May 10   Was unable to increase the Abilify dose after last week's visit  to 4 mg due to insurance refusing to pay for two refills in one month. Feels someweat better about life in general . Stressors include daughter's psychaiatric iissues,    Hands and feel getting cold,  Notices that toes and fingertips turn blue , then  white   Doesn't always happen in the cold sometimes in the warm air.    ROS: See pertinent positives and negatives per HPI.  Past Medical History:  Diagnosis Date  . Anxiety   . Depression   . Guillain Barr syndrome ([place]) 2005  . Osteoporosis   . Squamous cell skin cancer 2011    Past Surgical History:  Procedure Laterality Date  . ABDOMINAL HYSTERECTOMY  1989  . APPENDECTOMY  1960  .  BREAST SURGERY  1980   biopsy  . WRIST FRACTURE SURGERY Right 2015    Family History  Problem Relation Age of Onset  . Cancer Mother   . Mental illness Sister   . Bipolar disorder Sister   . Depression Brother   . Mental illness Brother   . Parkinson's disease Brother   . Dystonia Brother   . Anxiety disorder Daughter     SOCIAL HX: widower,  Lives with daughter since her husband's death    Current Outpatient Medications:  .  ARIPiprazole (ABILIFY) 2 MG tablet, , Disp: , Rfl:  .  ARIPiprazole (ABILIFY) 2 MG tablet, Take 2 tablets (4 mg total) by mouth daily., Disp: 60 tablet, Rfl: 1 .  Cholecalciferol (VITAMIN D3) 2000 UNITS CHEW, Chew 1 capsule by mouth daily. Reported on 08/29/2015, Disp: , Rfl:  .  clorazepate (TRANXENE) 7.5 MG tablet, Take 7.5 mg by mouth 3 (three) times daily. , Disp: , Rfl:  .  fluticasone (FLONASE) 50 MCG/ACT nasal spray, Place 2 sprays into both nostrils daily., Disp: 16 g, Rfl: 6 .  HYDROcodone-acetaminophen (NORCO/VICODIN) 5-325 MG tablet, Take 1 tablet by mouth every 6 (six) hours as needed for moderate pain., Disp: 15 tablet, Rfl: 0 .  Levomilnacipran HCl (FETZIMA PO), Take 1 capsule by mouth daily. 40mg , Disp: , Rfl:  .  meloxicam (MOBIC) 15 MG tablet, Take 1 tablet (15 mg total) by mouth daily., Disp: 30 tablet, Rfl: 2 .  tamsulosin (FLOMAX) 0.4 MG CAPS capsule, Take  0.4 mg by mouth daily., Disp: , Rfl: 11  EXAM:  VITALS per patient if applicable:  GENERAL: alert, oriented, appears well and in no acute distress  HEENT: atraumatic, conjunttiva clear, no obvious abnormalities on inspection of external nose and ears  NECK: normal movements of the head and neck  LUNGS: on inspection no signs of respiratory distress, breathing rate appears normal, no obvious gross SOB, gasping or wheezing  CV: no obvious cyanosis  MS: moves all visible extremities without noticeable abnormality  PSYCH/NEURO: pleasant and cooperative, no obvious depression or  anxiety, speech and thought processing grossly intact  ASSESSMENT AND PLAN:  Discussed the following assessment and plan   Eustachian tube disorder, right ENT evaluation planned to rule out traumatic damage to eardrum/sinus  Exposure to Covid-19 Virus COVID TESTING WAS NEGATIVE   Postmenopausal osteoporosis With confirmative T scores and history of recent wrist fracture in 2014 secondary to fall. 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 Patient has deferred use of alendronate which I agree may be problematic given her history of Guillain-Barr affecting the bulbar muscles.   SHE is tolerating Prolia and her next dose is due in July 2020  Recurrent depressive disorder, current episode moderate Her appointment with Dr Casimiro Needle is several weeks away.. she is meeting with Val Padett as well   Will increase Abilify to 4 mg daily   Pharmacy informed. Patient  Feels less overwhelmed and more positive after talking last week     I discussed the assessment and treatment plan with the patient. The patient was provided an opportunity to ask questions and all were answered. The patient agreed with the plan and demonstrated an understanding of the instructions.   The patient was advised to call back or seek an in-person evaluation if the symptoms worsen or if the condition fails to improve as anticipated.  I provided  25 minutes of non-face-to-face time during this encounter.   Crecencio Mc, MD

## 2018-11-30 NOTE — Assessment & Plan Note (Signed)
ENT evaluation planned to rule out traumatic damage to eardrum/sinus

## 2018-11-30 NOTE — Assessment & Plan Note (Signed)
COVID TESTING WAS NEGATIVE

## 2018-11-30 NOTE — Assessment & Plan Note (Signed)
Her appointment with Dr Casimiro Needle is several weeks away.. she is meeting with Val Padett as well   Will increase Abilify to 4 mg daily   Pharmacy informed. Patient  Feels less overwhelmed and more positive after talking last week

## 2018-11-30 NOTE — Assessment & Plan Note (Signed)
With confirmative T scores and history of recent wrist fracture in 2014 secondary to fall. Secondary to surgical menopause at age 73 followed by only 3 years of oral hormone therapy. Patient has deferred use of alendronate which I agree may be problematic given her history of Guillain-Barr affecting the bulbar muscles.   SHE is tolerating Prolia and her next dose is due in July 2020

## 2018-12-04 ENCOUNTER — Telehealth: Payer: Self-pay

## 2018-12-04 NOTE — Telephone Encounter (Signed)
PA submitted for abilify 2mg  two tablets daily on covermymeds.

## 2018-12-13 ENCOUNTER — Telehealth: Payer: Self-pay

## 2018-12-13 NOTE — Telephone Encounter (Signed)
Called and spoke patient.  Patient believes that her next Prolia injection is due in July.  Patient wants to know if Prolia has been ordered and approved.    Patient is ok with leaving a detailed voice message at [phone] if she misses the return call.

## 2018-12-13 NOTE — Telephone Encounter (Signed)
Copied from [place] [id number]. Topic: Appointment Scheduling - Scheduling Inquiry for Clinic >> Dec 13, 2018 11:08 AM Erick Blinks wrote: Reason for CRM: Pt called inquiring about prolea injection. Pt wants to be contacted  Best contact: [phone] VM okay

## 2018-12-21 NOTE — Telephone Encounter (Signed)
PA has been approved through 06/14/2019.

## 2018-12-22 ENCOUNTER — Ambulatory Visit (INDEPENDENT_AMBULATORY_CARE_PROVIDER_SITE_OTHER): Payer: Medicare Other | Admitting: Family

## 2018-12-22 ENCOUNTER — Telehealth: Payer: Self-pay

## 2018-12-22 ENCOUNTER — Encounter: Payer: Self-pay | Admitting: Family

## 2018-12-22 ENCOUNTER — Other Ambulatory Visit: Payer: Medicare Other

## 2018-12-22 ENCOUNTER — Other Ambulatory Visit: Payer: Self-pay

## 2018-12-22 DIAGNOSIS — R300 Dysuria: Secondary | ICD-10-CM | POA: Diagnosis not present

## 2018-12-22 LAB — POCT URINALYSIS DIPSTICK
Bilirubin, UA: NEGATIVE
Glucose, UA: NEGATIVE
Ketones, UA: NEGATIVE
Nitrite, UA: NEGATIVE
Protein, UA: NEGATIVE
Spec Grav, UA: 1.015 (ref 1.010–1.025)
Urobilinogen, UA: 0.2 E.U./dL
pH, UA: 7.5 (ref 5.0–8.0)

## 2018-12-22 LAB — URINALYSIS, MICROSCOPIC ONLY

## 2018-12-22 MED ORDER — NITROFURANTOIN MONOHYD MACRO 100 MG PO CAPS: 100.0000 mg | ORAL_CAPSULE | Freq: Two times a day (BID) | ORAL | 0 refills | Status: DC

## 2018-12-22 NOTE — Telephone Encounter (Signed)
Please order

## 2018-12-22 NOTE — Patient Instructions (Addendum)
Plenty of water  [facility]  Will call you with results of urine culture.   Ensure to take probiotics while on antibiotics and also for 2 weeks after completion. It is important to re-colonize the gut with good bacteria and also to prevent any diarrheal infections associated with antibiotic use.   Let us know if not better   Urinary Tract Infection, Adult  A urinary tract infection (UTI) is an infection of any part of the urinary tract. The urinary tract includes the kidneys, ureters, bladder, and urethra. These organs make, store, and get rid of urine in the body. Your health care provider may use other names to describe the infection. An upper UTI affects the ureters and kidneys (pyelonephritis). A lower UTI affects the bladder (cystitis) and urethra (urethritis). What are the causes? Most urinary tract infections are caused by bacteria in your genital area, around the entrance to your urinary tract (urethra). These bacteria grow and cause inflammation of your urinary tract. What increases the risk? You are more likely to develop this condition if:  You have a urinary catheter that stays in place (indwelling).  You are not able to control when you urinate or have a bowel movement (you have incontinence).  You are female and you: ? Use a spermicide or diaphragm for birth control. ? Have low estrogen levels. ? Are pregnant.  You have certain genes that increase your risk (genetics).  You are sexually active.  You take antibiotic medicines.  You have a condition that causes your flow of urine to slow down, such as: ? An enlarged prostate, if you are female. ? Blockage in your urethra (stricture). ? A kidney stone. ? A nerve condition that affects your bladder control (neurogenic bladder). ? Not getting enough to drink, or not urinating often.  You have certain medical conditions, such as: ? Diabetes. ? A weak disease-fighting system (immunesystem). ? Sickle cell  disease. ? Gout. ? Spinal cord injury. What are the signs or symptoms? Symptoms of this condition include:  Needing to urinate right away (urgently).  Frequent urination or passing small amounts of urine frequently.  Pain or burning with urination.  Blood in the urine.  Urine that smells bad or unusual.  Trouble urinating.  Cloudy urine.  Vaginal discharge, if you are female.  Pain in the abdomen or the lower back. You may also have:  Vomiting or a decreased appetite.  Confusion.  Irritability or tiredness.  A fever.  Diarrhea. The first symptom in older adults may be confusion. In some cases, they may not have any symptoms until the infection has worsened. How is this diagnosed? This condition is diagnosed based on your medical history and a physical exam. You may also have other tests, including:  Urine tests.  Blood tests.  Tests for sexually transmitted infections (STIs). If you have had more than one UTI, a cystoscopy or imaging studies may be done to determine the cause of the infections. How is this treated? Treatment for this condition includes:  Antibiotic medicine.  Over-the-counter medicines to treat discomfort.  Drinking enough water to stay hydrated. If you have frequent infections or have other conditions such as a kidney stone, you may need to see a health care provider who specializes in the urinary tract (urologist). In rare cases, urinary tract infections can cause sepsis. Sepsis is a life-threatening condition that occurs when the body responds to an infection. Sepsis is treated in the hospital with IV antibiotics, fluids, and other medicines. Follow  these instructions at home:  Medicines  Take over-the-counter and prescription medicines only as told by your health care provider.  If you were prescribed an antibiotic medicine, take it as told by your health care provider. Do not stop using the antibiotic even if you start to feel  better. General instructions  Make sure you: ? Empty your bladder often and completely. Do not hold urine for long periods of time. ? Empty your bladder after sex. ? Wipe from front to back after a bowel movement if you are female. Use each tissue one time when you wipe.  Drink enough fluid to keep your urine pale yellow.  Keep all follow-up visits as told by your health care provider. This is important. Contact a health care provider if:  Your symptoms do not get better after 1-2 days.  Your symptoms go away and then return. Get help right away if you have:  Severe pain in your back or your lower abdomen.  A fever.  Nausea or vomiting. Summary  A urinary tract infection (UTI) is an infection of any part of the urinary tract, which includes the kidneys, ureters, bladder, and urethra.  Most urinary tract infections are caused by bacteria in your genital area, around the entrance to your urinary tract (urethra).  Treatment for this condition often includes antibiotic medicines.  If you were prescribed an antibiotic medicine, take it as told by your health care provider. Do not stop using the antibiotic even if you start to feel better.  Keep all follow-up visits as told by your health care provider. This is important. This information is not intended to replace advice given to you by your health care provider. Make sure you discuss any questions you have with your health care provider. Document Released: 03/10/2005 Document Revised: 05/18/2018 Document Reviewed: 12/08/2017 Elsevier Patient Education  2020 [employer].

## 2018-12-22 NOTE — Telephone Encounter (Signed)
Copied from [place] [id number]. Topic: Appointment Scheduling - Scheduling Inquiry for Clinic >> Dec 21, 2018  5:17 PM Alanda Slim E wrote: Reason for CRM: Pt has a possible UTI with urgency to urinate. / please advise for an appt  Pt would like to bring a sample by tomorrow if possible / please advise asap

## 2018-12-22 NOTE — Telephone Encounter (Signed)
Copied from [place] [id number]. Topic: Appointment Scheduling - Scheduling Inquiry for Clinic >> Dec 21, 2018  5:17 PM Alanda Slim E wrote: Reason for CRM: Pt has a possible UTI with urgency to urinate. / please advise for an appt  Pt would like to bring a sample by tomorrow if possible / please advise asap

## 2018-12-22 NOTE — Assessment & Plan Note (Addendum)
Trace blood, moderate leukocytes. Will treat for for suspected UTI with macrobid. Await urine culture.

## 2018-12-22 NOTE — Telephone Encounter (Signed)
Called and spoke to patient.  Patient said that she is having UTI symptoms.  Started 2 days ago.  Pt said that she went through a period several years ago where she had uti's Patient said that she was on an antibiotic prescribed by Dr. Jacqlyn Larsen, her urologist for 3 months which cleared up symptoms for a couple of years.  Patient's urologist has moved to another state.  Pt said that she has an urge to urinate, chills while urinating, urine leaks, frequent urination, , bladder spasms, no fever, no blood, light yellow in color, some odor.  Pt said that she once was on flomax for having a leaky bladder.  No appts available today with PCP.  Patient scheduled phone visit with Mable Paris, FNP today @ 1:15 pm.  Patient will come into the office this morning to drop off her urine prior to phone appt.

## 2018-12-22 NOTE — Telephone Encounter (Signed)
I have ordered & patient is coming to leave sample.

## 2018-12-22 NOTE — Telephone Encounter (Signed)
FYI

## 2018-12-22 NOTE — Telephone Encounter (Signed)
noted 

## 2018-12-22 NOTE — Progress Notes (Signed)
Verbal consent for services obtained from patient prior to services given.  Location of call:  provider at work patient at home  Names of all persons present for services: Mable Paris, NP Chief complaint:  CC: urinary frequency x 2 days, unchanged.    Years ago where she had uti's and had been on prophylactic antibiotic with Dr Jacqlyn Larsen. No recent UTI.   Endorses urgency, chills while urinating, urinary frequency,bladder spasms   No fever, hematuria, flank pain, N, V   H/o of being on flomax for having a 'leaky bladder.'  H/o guillain barre syndrome   A/P/next steps:  Problem List Items Addressed This Visit      Other   Dysuria - Primary    Trace blood, moderate leukocytes. Will treat for for suspected UTI with macrobid. Await urine culture.       Relevant Medications   nitrofurantoin, macrocrystal-monohydrate, (MACROBID) 100 MG capsule      I spent 20 min  discussing plan of care over the phone.

## 2018-12-24 LAB — URINE CULTURE
MICRO NUMBER:: 654921
SPECIMEN QUALITY:: ADEQUATE

## 2018-12-24 IMAGING — MG MM DIGITAL SCREENING BILAT W/ TOMO W/ CAD
6 of 10 series · 6 of 30 positions shown · non-contrast
Comparison: Previous exam(s).

CLINICAL DATA: Screening.

EXAM:
DIGITAL SCREENING BILATERAL MAMMOGRAM WITH TOMO AND CAD

[R CC synth-2D]
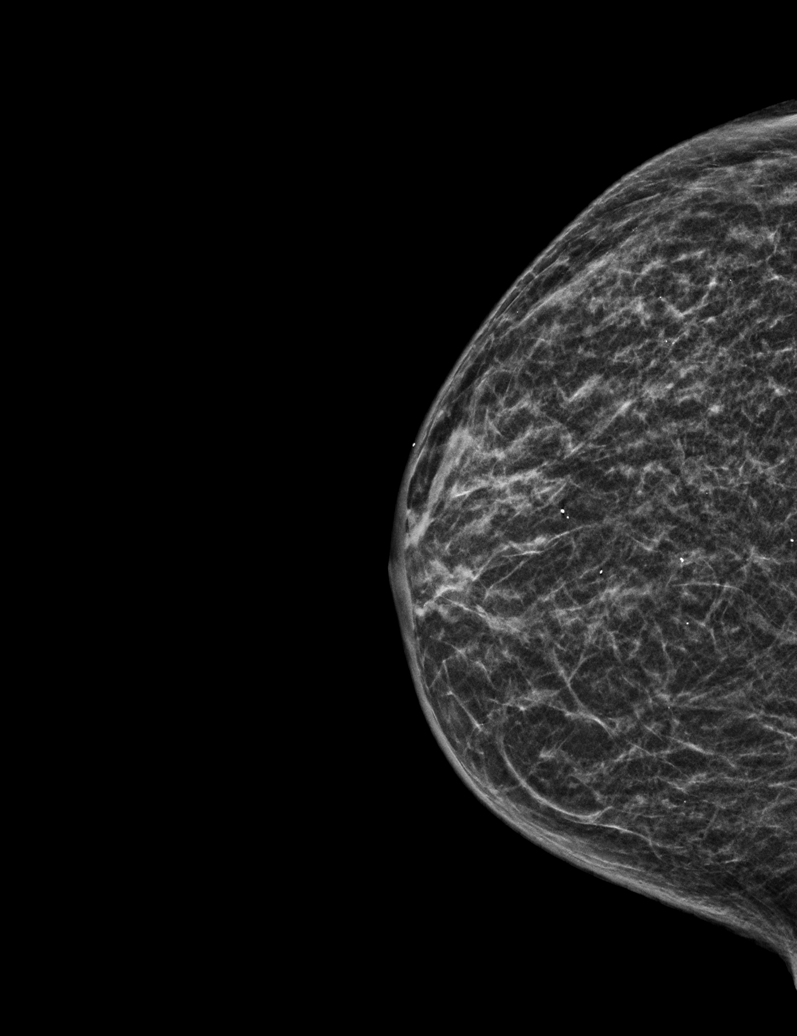

[R MLO synth-2D (1 of 2)]
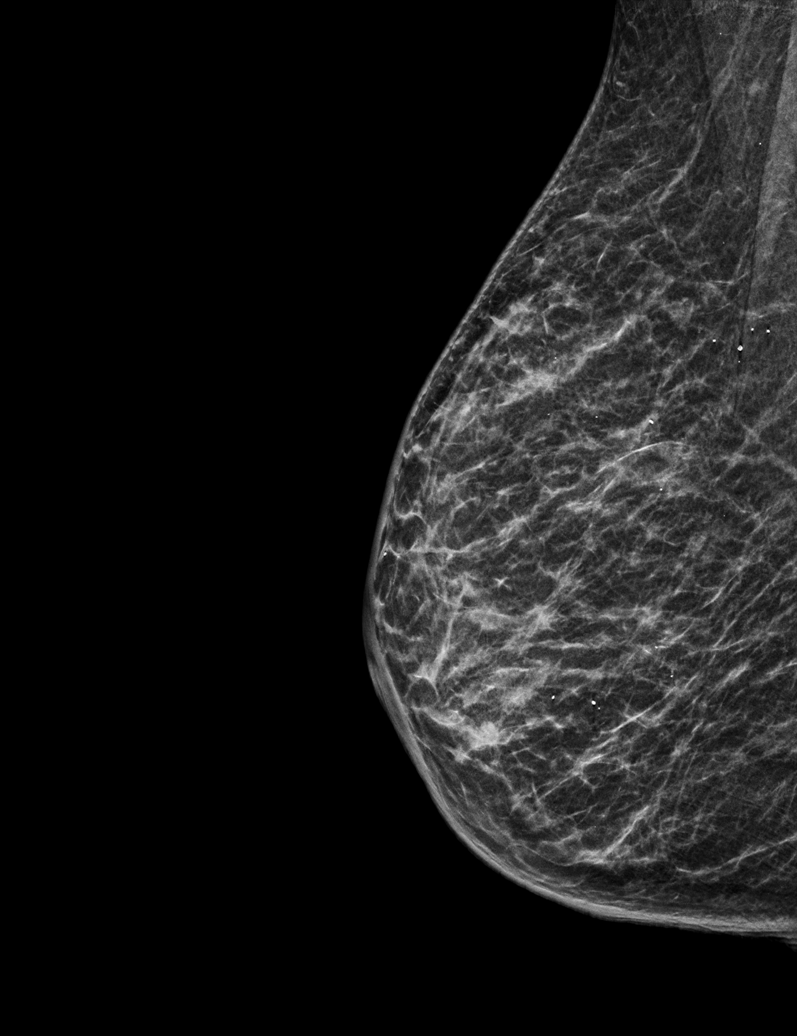

[L CC synth-2D]
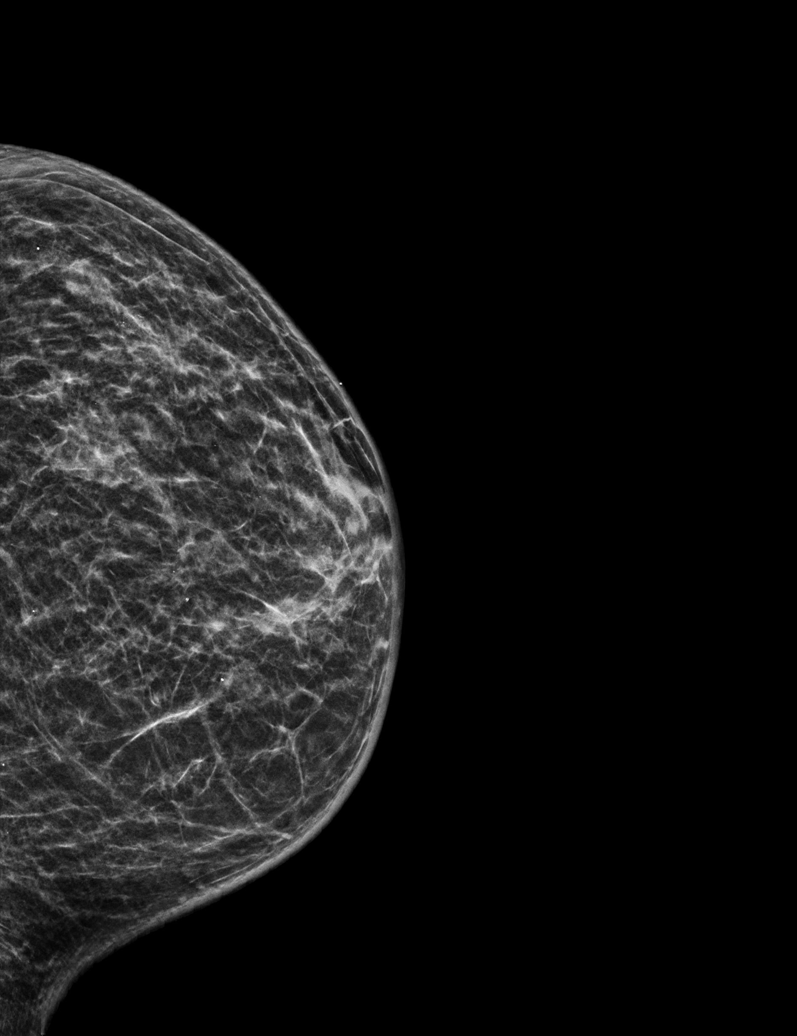

[L MLO synth-2D]
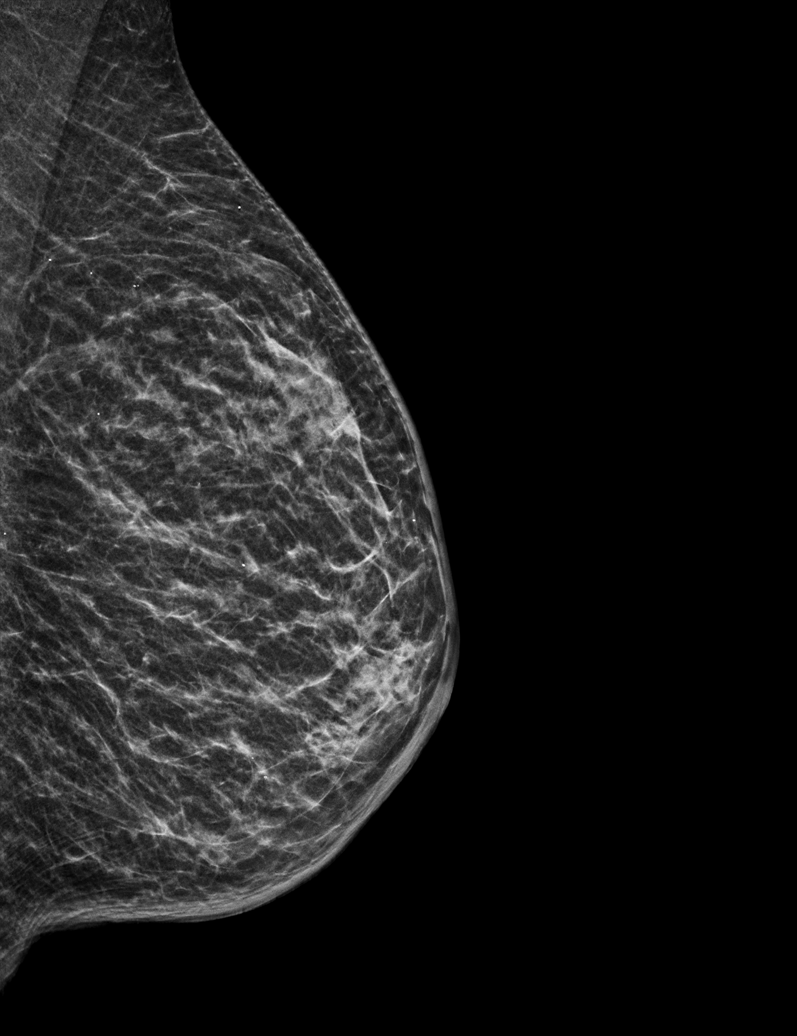

[R MLO synth-2D (2 of 2)]
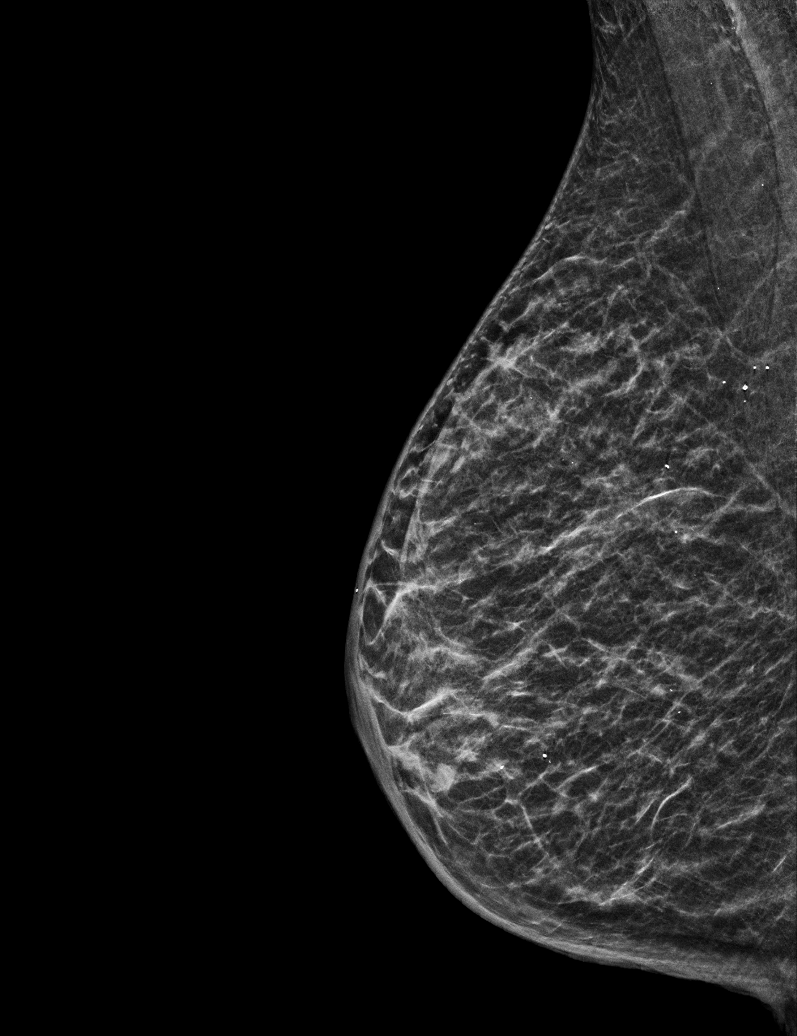

[R CC tomo · tomo slice 16/31.0]
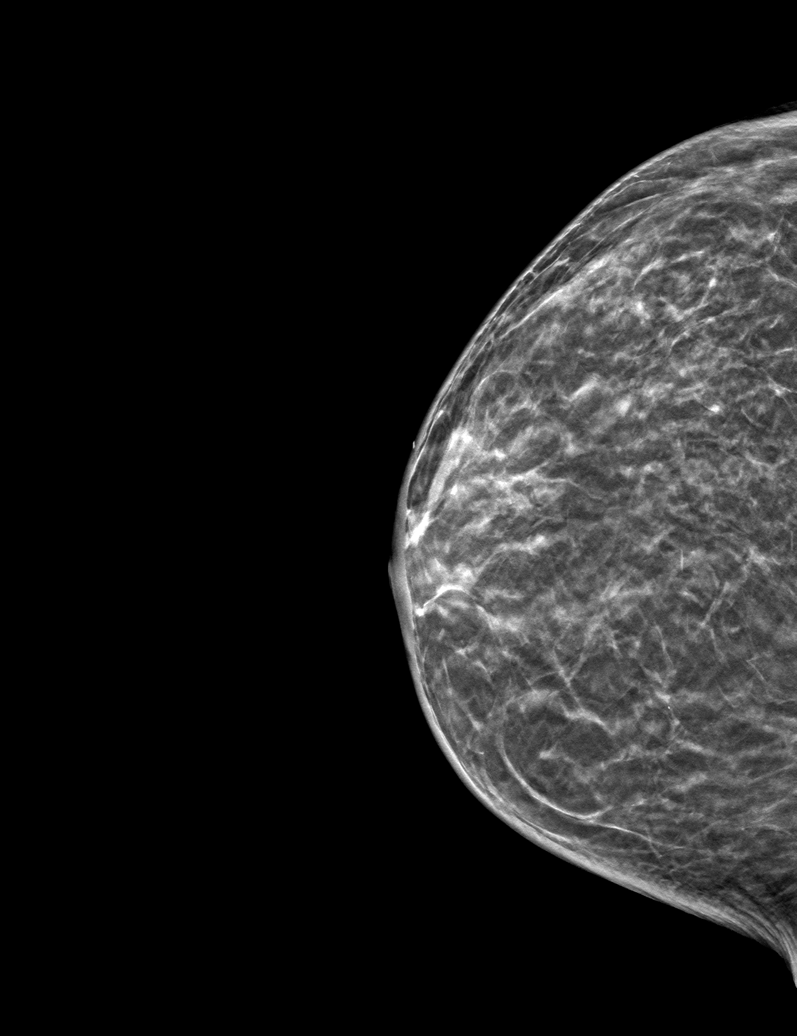

[6 of 30 positions shown; findings below may reference images not displayed]

ACR Breast Density Category c: The breast tissue is heterogeneously
dense, which may obscure small masses.
FINDINGS: There are no findings suspicious for malignancy. Images were
processed with CAD.
IMPRESSION: No mammographic evidence of malignancy. A result letter of this
screening mammogram will be mailed directly to the patient.

RECOMMENDATION:
Screening mammogram in one year. (Code:[id number]-U-LHB)

BI-RADS CATEGORY  1: Negative.

## 2018-12-27 NOTE — Telephone Encounter (Signed)
Insurance verification for Prolia filed on [employer] Portal. 

## 2018-12-28 NOTE — Telephone Encounter (Signed)
-

## 2019-01-01 ENCOUNTER — Other Ambulatory Visit: Payer: Self-pay

## 2019-01-01 ENCOUNTER — Ambulatory Visit (INDEPENDENT_AMBULATORY_CARE_PROVIDER_SITE_OTHER): Payer: Medicare Other | Admitting: *Deleted

## 2019-01-01 DIAGNOSIS — M810 Age-related osteoporosis without current pathological fracture: Secondary | ICD-10-CM | POA: Diagnosis not present

## 2019-01-01 MED ORDER — DENOSUMAB 60 MG/ML ~~LOC~~ SOSY
60.0000 mg | PREFILLED_SYRINGE | Freq: Once | SUBCUTANEOUS | Status: AC
  Administered 2019-01-01: 60 mg via SUBCUTANEOUS

## 2019-01-01 NOTE — Progress Notes (Signed)
Patient presented for Prolia injection to Left arm [place], patient voiced no concerns or complaints during or after injection. 

## 2019-01-10 ENCOUNTER — Ambulatory Visit (INDEPENDENT_AMBULATORY_CARE_PROVIDER_SITE_OTHER): Payer: Medicare Other | Admitting: Internal Medicine

## 2019-01-10 ENCOUNTER — Other Ambulatory Visit: Payer: Self-pay

## 2019-01-10 ENCOUNTER — Encounter: Payer: Self-pay | Admitting: Internal Medicine

## 2019-01-10 DIAGNOSIS — H6991 Unspecified Eustachian tube disorder, right ear: Secondary | ICD-10-CM

## 2019-01-10 DIAGNOSIS — G2111 Neuroleptic induced parkinsonism: Secondary | ICD-10-CM

## 2019-01-10 DIAGNOSIS — J300 Vasomotor rhinitis: Secondary | ICD-10-CM

## 2019-01-10 DIAGNOSIS — F331 Major depressive disorder, recurrent, moderate: Secondary | ICD-10-CM | POA: Diagnosis not present

## 2019-01-10 DIAGNOSIS — R339 Retention of urine, unspecified: Secondary | ICD-10-CM

## 2019-01-10 MED ORDER — IPRATROPIUM BROMIDE 0.06 % NA SOLN: 2.0000 | Freq: Four times a day (QID) | NASAL | 12 refills | Status: DC

## 2019-01-10 NOTE — Progress Notes (Signed)
Telephone Note  This visit type was conducted due to national recommendations for restrictions regarding the COVID-19 pandemic (e.g. social distancing).  This format is felt to be most appropriate for this patient at this time.  All issues noted in this document were discussed and addressed.  No physical exam was performed (except for noted visual exam findings with Video Visits).   I connected with@ on 01/10/19 at 10:30 AM EDT by telephone and verified that I am speaking with the correct person using two identifiers. Location patient: home Location provider: work or home office Persons participating in the virtual visit: patient, provider  I discussed the limitations, risks, security and privacy concerns of performing an evaluation and management service by telephone and the availability of in person appointments. I also discussed with the patient that there may be a patient responsible charge related to this service. The patient expressed understanding and agreed to proceed.  Reason for visit: follow up multiple issues  HPI:  Major Depressive Disorder :  Plovsky increased abilfty to 5 mg ,  And increased Fetzima to 80 mg.  She is feeling better with the medicaito changes.  He is also adding ambien for insomnia,  Still taking chlorazepate tid   PND: taking benadryl at night,  Offering atrovent given risk of pharmacotherapy with ambien, chlorazepate and benadryl.  And referral to [facility] at [facility] discussed   Stress incontinence : attributed in part to incomplete bladder emptying caused by history of Guillain Barre Syndrome  by Urology.  Flomax added .  rx to void every 2 hours.    ROS: Patient denies headache, fevers, malaise, unintentional weight loss, skin rash, eye pain, sinus congestion and sinus pain, sore throat, dysphagia,  hemoptysis , cough, dyspnea, wheezing, chest pain, palpitations, orthopnea, edema, abdominal pain, nausea, melena, diarrhea, constipation, flank pain, dysuria,  hematuria, urinary  Frequency, nocturia, numbness, tingling, seizures,  Focal weakness, Loss of consciousness,  Tremor, and suicidal ideation.      Past Medical History:  Diagnosis Date  . Anxiety   . Depression   . Guillain Barr syndrome ([place]) 2005  . Osteoporosis   . Squamous cell skin cancer 2011    Past Surgical History:  Procedure Laterality Date  . ABDOMINAL HYSTERECTOMY  1989  . APPENDECTOMY  1960  . BREAST SURGERY  1980   biopsy  . WRIST FRACTURE SURGERY Right 2015    Family History  Problem Relation Age of Onset  . Cancer Mother   . Mental illness Sister   . Bipolar disorder Sister   . Depression Brother   . Mental illness Brother   . Parkinson's disease Brother   . Dystonia Brother   . Anxiety disorder Daughter     SOCIAL HX:  reports that she quit smoking about 41 years ago. Her smoking use included cigarettes. She smoked 0.20 packs per day. She has never used smokeless tobacco. She reports that she does not drink alcohol or use drugs.  Current Outpatient Medications:  .  ARIPiprazole (ABILIFY) 2 MG tablet, Take 2 tablets (4 mg total) by mouth daily. (Patient taking differently: Take 4 mg by mouth daily. ), Disp: 60 tablet, Rfl: 1 .  Cholecalciferol (VITAMIN D3) 2000 UNITS CHEW, Chew 1 capsule by mouth daily. Reported on 08/29/2015, Disp: , Rfl:  .  clorazepate (TRANXENE) 7.5 MG tablet, Take 7.5 mg by mouth 3 (three) times daily. , Disp: , Rfl:  .  eszopiclone (LUNESTA) 1 MG TABS tablet, Take by mouth., Disp: , Rfl:  .  fluticasone (FLONASE) 50 MCG/ACT nasal spray, Place 2 sprays into both nostrils daily., Disp: 16 g, Rfl: 6 .  Levomilnacipran HCl (FETZIMA PO), Take 2 capsules by mouth daily. 80mg , Disp: , Rfl:  .  nitrofurantoin, macrocrystal-monohydrate, (MACROBID) 100 MG capsule, Take 1 capsule (100 mg total) by mouth 2 (two) times daily. Take with food., Disp: 10 capsule, Rfl: 0 .  tamsulosin (FLOMAX) 0.4 MG CAPS capsule, Take 0.4 mg by mouth daily. , Disp:  , Rfl:  .  ipratropium (ATROVENT) 0.06 % nasal spray, Place 2 sprays into both nostrils 4 (four) times daily., Disp: 15 mL, Rfl: 12  EXAM:   General impression: alert, cooperative and articulate.  No signs of being in distress  Lungs: speech is fluent sentence length suggests that patient is not short of breath and not punctuated by cough, sneezing or sniffing. Marland Kitchen   Psych: affect normal.  speech is articulate and non pressured .  Denies suicidal thoughts    ASSESSMENT AND PLAN:  Discussed the following assessment and plan:  Chronic vasomotor rhinitis Advised to dc benadryl and try atrovent to prevent pharmacotherapy induced adverse effects with addition of ambien . Referral to [facility]  Recurrent depressive disorder, current episode moderate Improving signs and symptoms with medication doses changed  by Plovsky  Eustachian tube disorder, right ENT evaluation planned to rule out traumatic damage to eardrum/sinus  Incomplete bladder emptying Post void residual 23 ml by recent [facility] evaluation.  Flomas added     I discussed the assessment and treatment plan with the patient. The patient was provided an opportunity to ask questions and all were answered. The patient agreed with the plan and demonstrated an understanding of the instructions.   The patient was advised to call back or seek an in-person evaluation if the symptoms worsen or if the condition fails to improve as anticipated.  I provided 25 minutes of non-face-to-face time during this encounter.   Crecencio Mc, MD

## 2019-01-10 NOTE — Patient Instructions (Signed)
Stop the benadryl and try using Atrovent nasal spray for the runny nose  Referral to Dr Remer Macho in progress at [facility]  Check with Dr Casimiro Needle about combining chlorazepate and ambien  Skip lunch on the day of your physical so we can do fasting labs.

## 2019-01-11 DIAGNOSIS — R339 Retention of urine, unspecified: Secondary | ICD-10-CM | POA: Insufficient documentation

## 2019-01-11 DIAGNOSIS — J300 Vasomotor rhinitis: Secondary | ICD-10-CM | POA: Insufficient documentation

## 2019-01-11 NOTE — Assessment & Plan Note (Signed)
Improving signs and symptoms with medication doses changed  by [employer]

## 2019-01-11 NOTE — Assessment & Plan Note (Signed)
Advised to dc benadryl and try atrovent to prevent pharmacotherapy induced adverse effects with addition of ambien . Referral to [facility]

## 2019-01-11 NOTE — Assessment & Plan Note (Signed)
ENT evaluation planned to rule out traumatic damage to eardrum/sinus

## 2019-01-11 NOTE — Assessment & Plan Note (Signed)
Post void residual 23 ml by recent [facility] evaluation.  Flomas added

## 2019-01-31 ENCOUNTER — Other Ambulatory Visit: Payer: Self-pay

## 2019-01-31 ENCOUNTER — Ambulatory Visit (INDEPENDENT_AMBULATORY_CARE_PROVIDER_SITE_OTHER): Payer: Medicare Other | Admitting: Family

## 2019-01-31 ENCOUNTER — Telehealth: Payer: Self-pay | Admitting: *Deleted

## 2019-01-31 DIAGNOSIS — R350 Frequency of micturition: Secondary | ICD-10-CM | POA: Diagnosis not present

## 2019-01-31 MED ORDER — PHENAZOPYRIDINE HCL 100 MG PO TABS: 100.0000 mg | ORAL_TABLET | Freq: Three times a day (TID) | ORAL | 0 refills | Status: DC | PRN

## 2019-01-31 MED ORDER — NITROFURANTOIN MONOHYD MACRO 100 MG PO CAPS: 100.0000 mg | ORAL_CAPSULE | Freq: Two times a day (BID) | ORAL | 0 refills | Status: AC

## 2019-01-31 NOTE — Progress Notes (Signed)
Verbal consent for services obtained from patient prior to services given to TELEPHONE visit:   Location of call:  provider at work patient at home  Names of all persons present for services: Mable Paris, NP Chief complaint:   Frequent urination x one day, unchanged.  Symptoms had resolved from 5 weeks ago. Endorses bladder spasms, chills. Started flomax one week ago. NO hematuria, fever, flank pain, n, vomiting.   Feels like has 'urinary tract infection'. Not sure if emptying bladder well.   Feels ciprofloxacin has worked better in the past  Given macrobid 12/22/18.  NO blood in UA.  UC shows sensitivity to macrobid.   History, background, results pertinent:   Incomplete  Bladder emptying- has been on flomax and seen [facility] urology 12/2018 ; restarted flomax.   No ckd  A/P/next steps:  Problem List Items Addressed This Visit      Other   Frequent urination    Duration 1 day.  Afebrile.  Does appear that she did improve on Macrobid.  I explained again that ciprofloxacin carries a black box warning ; she prefers to take Macrobid instead if culture is sensitive.  I advised she can start Macrobid after she is dropped off a urine specimen based on her discomfort  pending urinalysis to look for infection however did discuss at length whether not patient was completely emptying her bladder. Restarted flomas one week ago. ? Is this contributory.   She has seen urology in the past.  If urine culture did not show infection, I will recommend that she sees a urologist again for further evaluation       Other Visit Diagnoses    Urinary frequency    -  Primary   Relevant Medications   phenazopyridine (PYRIDIUM) 100 MG tablet   nitrofurantoin, macrocrystal-monohydrate, (MACROBID) 100 MG capsule   Other Relevant Orders   Urine Culture   Urinalysis, Routine w reflex microscopic (Completed)       I spent 15 min  discussing plan of care over the phone.

## 2019-01-31 NOTE — Telephone Encounter (Signed)
Copied from [place] [id number]. Topic: General - Other >> Jan 31, 2019 11:51 AM Antonieta Iba C wrote: Reason for CRM: pt called in to make provider aware that she has another UTI. Pt says that she is having discomfort below and frequent urination. Pt says that she was just treated for this but feels like it never completely left. Pt says that Cipro works best for her.   Pharmacy: [id number] - [place], [place] [phone] (Phone) [phone] (Fax)   Please advise.

## 2019-02-01 ENCOUNTER — Telehealth: Payer: Self-pay | Admitting: Internal Medicine

## 2019-02-01 ENCOUNTER — Other Ambulatory Visit (INDEPENDENT_AMBULATORY_CARE_PROVIDER_SITE_OTHER): Payer: Medicare Other

## 2019-02-01 DIAGNOSIS — R350 Frequency of micturition: Secondary | ICD-10-CM | POA: Diagnosis not present

## 2019-02-01 LAB — URINALYSIS, ROUTINE W REFLEX MICROSCOPIC
Bilirubin Urine: NEGATIVE
Hgb urine dipstick: NEGATIVE
Ketones, ur: NEGATIVE
Nitrite: NEGATIVE
RBC / HPF: NONE SEEN (ref 0–?)
Specific Gravity, Urine: 1.01 (ref 1.000–1.030)
Total Protein, Urine: NEGATIVE
Urine Glucose: NEGATIVE
Urobilinogen, UA: 0.2 (ref 0.0–1.0)
pH: 7 (ref 5.0–8.0)

## 2019-02-01 NOTE — Telephone Encounter (Signed)
Your urine culture has not been resulted yet,  But it does appear that you have an infection.  Please check your Mychart messages tomorrow, because margaret or I will likely have the results and be able to tell you ether to take the antibiotic that was sent  to your pharmacy

## 2019-02-01 NOTE — Telephone Encounter (Signed)
Pt states she was given medication for a UTI, but she hasn't picked it up yet.  States today she feels all better and needs to know if she should get the medication and take, or get the medication and not take until symptoms come back, or just leave medication and not pick it up at all.

## 2019-02-02 DIAGNOSIS — R350 Frequency of micturition: Secondary | ICD-10-CM | POA: Insufficient documentation

## 2019-02-02 LAB — URINE CULTURE
MICRO NUMBER:: 793255
SPECIMEN QUALITY:: ADEQUATE

## 2019-02-02 NOTE — Telephone Encounter (Signed)
LMTCB

## 2019-02-02 NOTE — Telephone Encounter (Signed)
Spoke with pt and informed her of her urine results. Pt stated that her symptoms started to get worse so she went ahead and started the antibiotic. The pt stated that she would be looking for the mychart message to tell her whether she needs to continue it or not.

## 2019-02-02 NOTE — Telephone Encounter (Signed)
Pt has been notified of results and pt has started taking the antibiotic. Dr. Derrel Nip said that she should expect a mychart message over the weekend to let her know if she needs to stop or continue the antibiotic.

## 2019-02-02 NOTE — Telephone Encounter (Signed)
It's not coming to my box,  Since Joycelyn Schmid ordered it , so I am forwarding the message to her since the culture has not been resulted yet

## 2019-02-02 NOTE — Telephone Encounter (Signed)
Pt called back. °

## 2019-02-02 NOTE — Assessment & Plan Note (Signed)
Duration 1 day.  Afebrile.  Does appear that she did improve on Macrobid.  I explained again that ciprofloxacin carries a black box warning ; she prefers to take Macrobid instead if culture is sensitive.  I advised she can start Macrobid after she is dropped off a urine specimen based on her discomfort  pending urinalysis to look for infection however did discuss at length whether not patient was completely emptying her bladder. Restarted flomas one week ago. ? Is this contributory.   She has seen urology in the past.  If urine culture did not show infection, I will recommend that she sees a urologist again for further evaluation

## 2019-02-02 NOTE — Telephone Encounter (Signed)
Call pt I sent in [place] for her during out visit this week I dont have urine culture yet however if she is still experiencing symptoms, she may start.  May be next week prior to culture being back

## 2019-02-22 ENCOUNTER — Encounter: Payer: Self-pay | Admitting: Internal Medicine

## 2019-02-28 ENCOUNTER — Encounter: Payer: Medicare Other | Admitting: Internal Medicine

## 2019-03-05 ENCOUNTER — Encounter: Payer: Medicare Other | Admitting: Internal Medicine

## 2019-03-22 ENCOUNTER — Other Ambulatory Visit: Payer: Self-pay

## 2019-03-27 ENCOUNTER — Ambulatory Visit (INDEPENDENT_AMBULATORY_CARE_PROVIDER_SITE_OTHER): Payer: Medicare Other | Admitting: Internal Medicine

## 2019-03-27 ENCOUNTER — Encounter: Payer: Self-pay | Admitting: Internal Medicine

## 2019-03-27 ENCOUNTER — Other Ambulatory Visit: Payer: Self-pay

## 2019-03-27 VITALS — BP 128/72 | HR 79 | Temp 96.9°F | Resp 14 | Ht 66.0 in | Wt 163.6 lb

## 2019-03-27 DIAGNOSIS — Z0000 Encounter for general adult medical examination without abnormal findings: Secondary | ICD-10-CM

## 2019-03-27 DIAGNOSIS — R5383 Other fatigue: Secondary | ICD-10-CM | POA: Diagnosis not present

## 2019-03-27 DIAGNOSIS — Z1211 Encounter for screening for malignant neoplasm of colon: Secondary | ICD-10-CM | POA: Diagnosis not present

## 2019-03-27 DIAGNOSIS — E559 Vitamin D deficiency, unspecified: Secondary | ICD-10-CM

## 2019-03-27 DIAGNOSIS — G2111 Neuroleptic induced parkinsonism: Secondary | ICD-10-CM

## 2019-03-27 DIAGNOSIS — F329 Major depressive disorder, single episode, unspecified: Secondary | ICD-10-CM | POA: Diagnosis not present

## 2019-03-27 DIAGNOSIS — E785 Hyperlipidemia, unspecified: Secondary | ICD-10-CM | POA: Diagnosis not present

## 2019-03-27 DIAGNOSIS — M810 Age-related osteoporosis without current pathological fracture: Secondary | ICD-10-CM | POA: Diagnosis not present

## 2019-03-27 DIAGNOSIS — Z1231 Encounter for screening mammogram for malignant neoplasm of breast: Secondary | ICD-10-CM

## 2019-03-27 NOTE — Progress Notes (Signed)
Patient ID: Holly [name], female    DOB: February 25, 1946  Age: 73 y.o. MRN: [id number]  The patient is here for annual preventive  examination and management of other chronic and acute problems.   The risk factors are reflected in the social history.  The roster of all physicians providing medical care to patient - is listed in the Snapshot section of the chart.  Activities of daily living:  The patient is 100% independent in all ADLs: dressing, toileting, feeding as well as independent mobility  Home safety : The patient has smoke detectors in the home. They wear seatbelts.  There are no firearms at home. There is no violence in the home.   There is no risks for hepatitis, STDs or HIV. There is no   history of blood transfusion. They have no travel history to infectious disease endemic areas of the world.  The patient has seen their dentist in the last six month. They have seen their eye doctor in the last year. They admit to slight hearing difficulty with regard to whispered voices and some television programs.  They have deferred audiologic testing in the last year.  They do not  have excessive sun exposure. Discussed the need for sun protection: hats, long sleeves and use of sunscreen if there is significant sun exposure.   Diet: the importance of a healthy diet is discussed. They do have a healthy diet.  The benefits of regular aerobic exercise were discussed. She walks 4 times per week ,  20 minutes.   Depression screen: there are no signs or vegative symptoms of depression- irritability, change in appetite, anhedonia, sadness/tearfullness.  Cognitive assessment: the patient manages all their financial and personal affairs and is actively engaged. They could relate day,date,year and events; recalled 2/3 objects at 3 minutes; performed clock-face test normally.  The following portions of the patient's history were reviewed and updated as appropriate: allergies, current medications, past  family history, past medical history,  past surgical history, past social history  and problem list.  Visual acuity was not assessed per patient preference since she has regular follow up with her ophthalmologist. Hearing and body mass index were assessed and reviewed.   During the course of the visit the patient was educated and counseled about appropriate screening and preventive services including : fall prevention , diabetes screening, nutrition counseling, colorectal cancer screening, and recommended immunizations.    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  1)  2 episodes during the summer of sudden onset of nausea, dizziness and severe occipital headache , both occurred after 90 minutes in the heat .  Symptoms resolved with supine position /rest and ES Tylenol for headache  2) nasal septal fracture   And deviated septum was not happy with initial ENT evaluation Holly [name])   Got a second opinion from [place] referred her Holly [name] at [facility] for nasal reconstruction .  appt oct 22 .  Not sleeping well due to nocturnal nasal collapse   3) Osteoporosis in left hip  treated with Prolia  Since 2015 .  Not taking vitamin D or calcium supplements.   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   She has a past surgical history that includes Breast surgery (1980); Appendectomy (1960); Abdominal hysterectomy (1989); and Wrist fracture  surgery (Right, 2015).   Her family history includes Anxiety disorder in her daughter; Bipolar disorder in her sister; Cancer in her mother; Depression in her brother; Dystonia in her brother; Mental illness in her brother and sister; Parkinson's disease in her brother.She reports that she quit smoking about 41 years ago. Her smoking use included cigarettes. She smoked 0.20 packs per day. She has never used smokeless tobacco. She reports that she does not drink alcohol or use drugs.  Outpatient Medications Prior to Visit  Medication Sig Dispense Refill  . ARIPiprazole (ABILIFY) 5 MG tablet TAKE 1 TABLET BY MOUTH EVERY DAY IN THE MORNING    . clorazepate (TRANXENE) 7.5 MG tablet Take 7.5 mg by mouth 3 (three) times daily.     . fluticasone (FLONASE) 50 MCG/ACT nasal spray Place 2 sprays into both nostrils daily. 16 g 6  . Levomilnacipran HCl (FETZIMA PO) Take 2 capsules by mouth daily. 80mg     . phenazopyridine (PYRIDIUM) 100 MG tablet Take 1 tablet (100 mg total) by mouth 3 (three) times daily as needed for pain. 9 tablet 0  . tamsulosin (FLOMAX) 0.4 MG CAPS capsule Take 0.4 mg by mouth daily.     . traZODone (DESYREL) 50 MG tablet Take 50 mg by mouth at bedtime.     . ARIPiprazole (ABILIFY) 2 MG tablet Take 2 tablets (4 mg total) by mouth daily. (Patient not taking: Reported on 03/27/2019) 60 tablet 1  . Cholecalciferol (VITAMIN D3) 2000 UNITS CHEW Chew 1 capsule by mouth daily. Reported on 08/29/2015    . eszopiclone (LUNESTA) 1 MG TABS tablet Take by mouth.    Marland Kitchen ipratropium (ATROVENT) 0.06 % nasal spray Place 2 sprays into both nostrils 4 (four) times daily. (Patient not taking: Reported on 03/27/2019) 15 mL 12   No facility-administered medications prior to visit.     Review of Systems   Patient denies headache, fevers, malaise, unintentional weight loss, skin rash, eye pain, sinus congestion and sinus pain, sore throat, dysphagia,  hemoptysis , cough, dyspnea, wheezing, chest pain,  palpitations, orthopnea, edema, abdominal pain, nausea, melena, diarrhea, constipation, flank pain, dysuria, hematuria, urinary  Frequency, nocturia, numbness, tingling, seizures,  Focal weakness, Loss of consciousness,  Tremor, insomnia, depression, anxiety, and suicidal ideation.      Objective:  BP 128/72 (BP Location: Left Arm, Patient Position: Sitting, Cuff Size: Normal)   Pulse 79   Temp (!) 96.9 F (36.1 C) (Temporal)   Resp 14   Ht 5\' 6"  (1.676 m)   Wt 163 lb 9.6 oz (74.2 kg)   SpO2 99%   BMI 26.41 kg/m   Physical Exam   General appearance: alert, cooperative and appears stated age Head: Normocephalic, without obvious abnormality, atraumatic Eyes: conjunctivae/corneas clear. PERRL, EOM's intact. Fundi benign. Ears: normal TM's and external ear canals both ears Nose: Nares normal. Septum midline. Mucosa normal. No drainage or sinus tenderness. Throat: lips, mucosa, and tongue normal; teeth and gums normal Neck: no adenopathy, no carotid bruit, no JVD, supple, symmetrical, trachea midline and thyroid not enlarged, symmetric, no tenderness/mass/nodules Lungs: clear to auscultation bilaterally Breasts: normal appearance, no masses or tenderness Heart: regular rate and rhythm, S1, S2 normal, no murmur, click, rub or gallop Abdomen:  soft, non-tender; bowel sounds normal; no masses,  no organomegaly Extremities: extremities normal, atraumatic, no cyanosis or edema Pulses: 2+ and symmetric Skin: Skin color, texture, turgor normal. No rashes or lesions Neurologic: Alert and oriented X 3, normal strength and tone. Normal symmetric reflexes. Gait not tested.  Pill rolling tremor of hands,  Resting tremor of LE's noted .     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  I have personally reviewed and have noted:  1) the patient's medical and social history 2) The pt's use of alcohol, tobacco, and illicit drugs 3) The patient's current medications and supplements 4) Functional ability including ADL's, fall risk, home safety risk, hearing and visual impairment 5) Diet and physical activities 6) Evidence for depression or mood disorder 7) The patient's height, weight, and BMI have been recorded in the chart  I have made referrals, and provided counseling and education based on review of the above       Other Visit Diagnoses    Colon cancer screening       Relevant Orders   Cologuard   Vitamin D deficiency       Relevant Orders   VITAMIN D 25 Hydroxy (Vit-D Deficiency, Fractures)   Fatigue, unspecified type       Relevant Orders   TSH   CBC with Differential/Platelet   Comprehensive metabolic panel   Hyperlipidemia LDL goal <160       Relevant Orders   Lipid panel   Encounter for screening mammogram for breast cancer       Relevant Orders   MM 3D SCREEN BREAST BILATERAL      I have discontinued Holly [name]'s Vitamin D3, eszopiclone, and ipratropium. I am also having her maintain her clorazepate, Levomilnacipran HCl (FETZIMA PO), fluticasone, tamsulosin, phenazopyridine, traZODone, and ARIPiprazole.  No orders of the defined types were placed in this encounter.   Medications Discontinued During This Encounter  Medication Reason  . ipratropium (ATROVENT) 0.06 % nasal spray Patient has not taken in last 30 days  . eszopiclone (LUNESTA) 1 MG TABS tablet Patient has not taken in last 30 days  . Cholecalciferol (VITAMIN D3) 2000 UNITS CHEW Patient has not taken in last 30 days  . ARIPiprazole (ABILIFY) 2 MG tablet Duplicate     Follow-up: No follow-ups on file.   Crecencio Mc, MD

## 2019-03-27 NOTE — Patient Instructions (Signed)
I think you should go ahead with the nasal reconstruction .  I have ordered the DEXA scan to follow up on your osteoporosis  Your annual mammogram has been ordered.  You are encouraged (required) to call to make your appointment at [facility]    Health Maintenance for Postmenopausal Women Menopause is a normal process in which your ability to get pregnant comes to an end. This process happens slowly over many months or years, usually between the ages of 5 and 16. Menopause is complete when you have missed your menstrual periods for 12 months. It is important to talk with your health care provider about some of the most common conditions that affect women after menopause (postmenopausal women). These include heart disease, cancer, and bone loss (osteoporosis). Adopting a healthy lifestyle and getting preventive care can help to promote your health and wellness. The actions you take can also lower your chances of developing some of these common conditions. What should I know about menopause? During menopause, you may get a number of symptoms, such as:  Hot flashes. These can be moderate or severe.  Night sweats.  Decrease in sex drive.  Mood swings.  Headaches.  Tiredness.  Irritability.  Memory problems.  Insomnia. Choosing to treat or not to treat these symptoms is a decision that you make with your health care provider. Do I need hormone replacement therapy?  Hormone replacement therapy is effective in treating symptoms that are caused by menopause, such as hot flashes and night sweats.  Hormone replacement carries certain risks, especially as you become older. If you are thinking about using estrogen or estrogen with progestin, discuss the benefits and risks with your health care provider. What is my risk for heart disease and stroke? The risk of heart disease, heart attack, and stroke increases as you age. One of the causes may be a change in the body's hormones  during menopause. This can affect how your body uses dietary fats, triglycerides, and cholesterol. Heart attack and stroke are medical emergencies. There are many things that you can do to help prevent heart disease and stroke. Watch your blood pressure  High blood pressure causes heart disease and increases the risk of stroke. This is more likely to develop in people who have high blood pressure readings, are of African descent, or are overweight.  Have your blood pressure checked: ? Every 3-5 years if you are 20-31 years of age. ? Every year if you are 75 years old or older. Eat a healthy diet   Eat a diet that includes plenty of vegetables, fruits, low-fat dairy products, and lean protein.  Do not eat a lot of foods that are high in solid fats, added sugars, or sodium. Get regular exercise Get regular exercise. This is one of the most important things you can do for your health. Most adults should:  Try to exercise for at least 150 minutes each week. The exercise should increase your heart rate and make you sweat (moderate-intensity exercise).  Try to do strengthening exercises at least twice each week. Do these in addition to the moderate-intensity exercise.  Spend less time sitting. Even light physical activity can be beneficial. Other tips  Work with your health care provider to achieve or maintain a healthy weight.  Do not use any products that contain nicotine or tobacco, such as cigarettes, e-cigarettes, and chewing tobacco. If you need help quitting, ask your health care provider.  Know your numbers. Ask your health care provider  to check your cholesterol and your blood sugar (glucose). Continue to have your blood tested as directed by your health care provider. Do I need screening for cancer? Depending on your health history and family history, you may need to have cancer screening at different stages of your life. This may include screening for:  Breast cancer.  Cervical  cancer.  Lung cancer.  Colorectal cancer. What is my risk for osteoporosis? After menopause, you may be at increased risk for osteoporosis. Osteoporosis is a condition in which bone destruction happens more quickly than new bone creation. To help prevent osteoporosis or the bone fractures that can happen because of osteoporosis, you may take the following actions:  If you are 78-39 years old, get at least 1,000 mg of calcium and at least 600 mg of vitamin D per day.  If you are older than age 74 but younger than age 39, get at least 1,200 mg of calcium and at least 600 mg of vitamin D per day.  If you are older than age 20, get at least 1,200 mg of calcium and at least 800 mg of vitamin D per day. Smoking and drinking excessive alcohol increase the risk of osteoporosis. Eat foods that are rich in calcium and vitamin D, and do weight-bearing exercises several times each week as directed by your health care provider. How does menopause affect my mental health? Depression may occur at any age, but it is more common as you become older. Common symptoms of depression include:  Low or sad mood.  Changes in sleep patterns.  Changes in appetite or eating patterns.  Feeling an overall lack of motivation or enjoyment of activities that you previously enjoyed.  Frequent crying spells. Talk with your health care provider if you think that you are experiencing depression. General instructions See your health care provider for regular wellness exams and vaccines. This may include:  Scheduling regular health, dental, and eye exams.  Getting and maintaining your vaccines. These include: ? Influenza vaccine. Get this vaccine each year before the flu season begins. ? Pneumonia vaccine. ? Shingles vaccine. ? Tetanus, diphtheria, and pertussis (Tdap) booster vaccine. Your health care provider may also recommend other immunizations. Tell your health care provider if you have ever been abused or do  not feel safe at home. Summary  Menopause is a normal process in which your ability to get pregnant comes to an end.  This condition causes hot flashes, night sweats, decreased interest in sex, mood swings, headaches, or lack of sleep.  Treatment for this condition may include hormone replacement therapy.  Take actions to keep yourself healthy, including exercising regularly, eating a healthy diet, watching your weight, and checking your blood pressure and blood sugar levels.  Get screened for cancer and depression. Make sure that you are up to date with all your vaccines. This information is not intended to replace advice given to you by your health care provider. Make sure you discuss any questions you have with your health care provider. Document Released: 07/23/2005 Document Revised: 05/24/2018 Document Reviewed: 05/24/2018 Elsevier Patient Education  2020 [employer].

## 2019-03-27 NOTE — Assessment & Plan Note (Signed)

## 2019-03-28 LAB — COMPREHENSIVE METABOLIC PANEL
ALT: 11 U/L (ref 0–35)
AST: 13 U/L (ref 0–37)
Albumin: 4.7 g/dL (ref 3.5–5.2)
Alkaline Phosphatase: 43 U/L (ref 39–117)
BUN: 23 mg/dL (ref 6–23)
CO2: 29 mEq/L (ref 19–32)
Calcium: 10.1 mg/dL (ref 8.4–10.5)
Chloride: 100 mEq/L (ref 96–112)
Creatinine, Ser: 0.93 mg/dL (ref 0.40–1.20)
GFR: 59.4 mL/min — ABNORMAL LOW (ref >60.00)
Glucose, Bld: 78 mg/dL (ref 70–99)
Potassium: 4.2 mEq/L (ref 3.5–5.1)
Sodium: 140 mEq/L (ref 135–145)
Total Bilirubin: 0.5 mg/dL (ref 0.2–1.2)
Total Protein: 7.4 g/dL (ref 6.0–8.3)

## 2019-03-28 LAB — LIPID PANEL
Cholesterol: 224 mg/dL — ABNORMAL HIGH (ref 0–200)
HDL: 89.1 mg/dL (ref >39.00)
LDL Cholesterol: 123 mg/dL — ABNORMAL HIGH (ref 0–99)
NonHDL: 135.32
Total CHOL/HDL Ratio: 3
Triglycerides: 61 mg/dL (ref 0.0–149.0)
VLDL: 12.2 mg/dL (ref 0.0–40.0)

## 2019-03-28 LAB — CBC WITH DIFFERENTIAL/PLATELET
Basophils Absolute: 0.1 10*3/uL (ref 0.0–0.1)
Basophils Relative: 1 % (ref 0.0–3.0)
Eosinophils Absolute: 0.1 10*3/uL (ref 0.0–0.7)
Eosinophils Relative: 1 % (ref 0.0–5.0)
HCT: 37 % (ref 36.0–46.0)
Hemoglobin: 12.3 g/dL (ref 12.0–15.0)
Lymphocytes Relative: 34.7 % (ref 12.0–46.0)
Lymphs Abs: 1.9 10*3/uL (ref 0.7–4.0)
MCHC: 33.3 g/dL (ref 30.0–36.0)
MCV: 92.6 fl (ref 78.0–100.0)
Monocytes Absolute: 0.4 10*3/uL (ref 0.1–1.0)
Monocytes Relative: 7.8 % (ref 3.0–12.0)
Neutro Abs: 3 10*3/uL (ref 1.4–7.7)
Neutrophils Relative %: 55.5 % (ref 43.0–77.0)
Platelets: 267 10*3/uL (ref 150.0–400.0)
RBC: 3.99 Mil/uL (ref 3.87–5.11)
RDW: 12.9 % (ref 11.5–15.5)
WBC: 5.4 10*3/uL (ref 4.0–10.5)

## 2019-03-28 LAB — TSH: TSH: 1.53 u[IU]/mL (ref 0.35–4.50)

## 2019-03-28 LAB — VITAMIN D 25 HYDROXY (VIT D DEFICIENCY, FRACTURES): VITD: 22.78 ng/mL — ABNORMAL LOW (ref 30.00–100.00)

## 2019-03-29 MED ORDER — ERGOCALCIFEROL 1.25 MG (50000 UT) PO CAPS: 50000.0000 [IU] | ORAL_CAPSULE | ORAL | 0 refills | Status: DC

## 2019-03-29 NOTE — Addendum Note (Signed)
Addended by: Crecencio Mc on: 03/29/2019 09:03 PM   Modules accepted: Orders

## 2019-04-19 ENCOUNTER — Other Ambulatory Visit: Payer: Self-pay | Admitting: Internal Medicine

## 2019-04-19 LAB — COLOGUARD: Cologuard: NEGATIVE

## 2019-05-01 ENCOUNTER — Telehealth: Payer: Self-pay | Admitting: Internal Medicine

## 2019-05-01 ENCOUNTER — Other Ambulatory Visit: Payer: Self-pay

## 2019-05-01 NOTE — Telephone Encounter (Signed)
Spoke with pt and informed her of her cologuard results. Pt gave a verbal understanding. Abstracted.

## 2019-05-01 NOTE — Telephone Encounter (Signed)
The results of patient's cologuard is negative.   We will repeat annually  For colon CA screening. Please abstract

## 2019-05-03 ENCOUNTER — Ambulatory Visit
Admission: RE | Admit: 2019-05-03 | Discharge: 2019-05-03 | Disposition: A | Discharge status: Home / Self Care | Payer: Medicare Other | Source: Ambulatory Visit | Attending: Internal Medicine | Admitting: Internal Medicine

## 2019-05-03 DIAGNOSIS — M810 Age-related osteoporosis without current pathological fracture: Secondary | ICD-10-CM | POA: Diagnosis not present

## 2019-06-18 ENCOUNTER — Telehealth: Payer: Self-pay | Admitting: Internal Medicine

## 2019-06-18 NOTE — Telephone Encounter (Signed)
Pt would like a call back about her Prolia injection. Please advise.

## 2019-06-19 NOTE — Telephone Encounter (Signed)
Spoke with pt and she stated that she is coming up due for her Prolia injection around 07/04/2019. Pt has a new insurance but we do not have it on file so she stated that she would try to send it through [facility] and if not she would bring it by the office to be scanned.

## 2019-06-20 NOTE — Telephone Encounter (Signed)
Need insurance card for verification for Prolia patient due 07/04/19,  patient has appt virtual on 06/22/19

## 2019-06-22 ENCOUNTER — Ambulatory Visit (INDEPENDENT_AMBULATORY_CARE_PROVIDER_SITE_OTHER): Payer: Medicare PPO | Admitting: Internal Medicine

## 2019-06-22 ENCOUNTER — Other Ambulatory Visit (INDEPENDENT_AMBULATORY_CARE_PROVIDER_SITE_OTHER): Payer: Medicare PPO

## 2019-06-22 ENCOUNTER — Encounter: Payer: Self-pay | Admitting: Internal Medicine

## 2019-06-22 ENCOUNTER — Other Ambulatory Visit: Payer: Self-pay

## 2019-06-22 VITALS — Ht 66.0 in | Wt 163.0 lb

## 2019-06-22 DIAGNOSIS — R350 Frequency of micturition: Secondary | ICD-10-CM

## 2019-06-22 DIAGNOSIS — N3946 Mixed incontinence: Secondary | ICD-10-CM | POA: Diagnosis not present

## 2019-06-22 DIAGNOSIS — N319 Neuromuscular dysfunction of bladder, unspecified: Secondary | ICD-10-CM

## 2019-06-22 LAB — URINALYSIS, ROUTINE W REFLEX MICROSCOPIC
Bilirubin Urine: NEGATIVE
Hgb urine dipstick: NEGATIVE
Leukocytes,Ua: NEGATIVE
Nitrite: NEGATIVE
RBC / HPF: NONE SEEN (ref 0–?)
Specific Gravity, Urine: >=1.03 — AB (ref 1.000–1.030)
Total Protein, Urine: NEGATIVE
Urine Glucose: NEGATIVE
Urobilinogen, UA: 0.2 (ref 0.0–1.0)
pH: 5.5 (ref 5.0–8.0)

## 2019-06-22 NOTE — Progress Notes (Signed)
Telephone  Note  This visit type was conducted due to national recommendations for restrictions regarding the COVID-19 pandemic (e.g. social distancing).  This format is felt to be most appropriate for this patient at this time.  All issues noted in this document were discussed and addressed.  No physical exam was performed (except for noted visual exam findings with Video Visits).   I connected with@ on 06/22/19 at 11:30 AM EST by  telephone and verified that I am speaking with the correct person using two identifiers. Location patient: home Location provider: work or home office Persons participating in the virtual visit: patient, provider  I discussed the limitations, risks, security and privacy concerns of performing an evaluation and management service by telephone and the availability of in person appointments. I also discussed with the patient that there may be a patient responsible charge related to this service. The patient expressed understanding and agreed to proceed.  Reason for visit: urinary incontinence   HPI:   74 yr old female with  History of TAH at age 26 ,  Presents with worsening urinary incontinence. She has a history of neurogenic bladder and has been under the care of Dr Alto Denver at [facility]  For the past several months. Intermittent dysuria (about 60% of the time),  Has urgency with urge incontinence but then can void only small amounts . Using 7 pads daily  Using flomax for several months per urologist in [place], practice Kegel exercises,  But no change.  Has no control when she stands up.  Has decreased sensation since recovering from [place]; this is known to her urologist   Last urine culture negative August done when she presented with dysuria   ROS: See pertinent positives and negatives per HPI.  Past Medical History:  Diagnosis Date  . Anxiety   . Depression   . Guillain Barr syndrome ([place]) 2005  . Osteoporosis   . Squamous cell skin  cancer 2011    Past Surgical History:  Procedure Laterality Date  . ABDOMINAL HYSTERECTOMY  1989  . APPENDECTOMY  1960  . BREAST SURGERY  1980   biopsy  . WRIST FRACTURE SURGERY Right 2015    Family History  Problem Relation Age of Onset  . Cancer Mother   . Mental illness Sister   . Bipolar disorder Sister   . Depression Brother   . Mental illness Brother   . Parkinson's disease Brother   . Dystonia Brother   . Anxiety disorder Daughter     SOCIAL HX:  reports that she quit smoking about 42 years ago. Her smoking use included cigarettes. She smoked 0.20 packs per day. She has never used smokeless tobacco. She reports that she does not drink alcohol or use drugs.  Current Outpatient Medications:  .  ARIPiprazole (ABILIFY) 5 MG tablet, TAKE 1 TABLET BY MOUTH EVERY DAY IN THE MORNING, Disp: , Rfl:  .  clorazepate (TRANXENE) 7.5 MG tablet, Take 7.5 mg by mouth 3 (three) times daily. , Disp: , Rfl:  .  ergocalciferol (DRISDOL) 1.25 MG (50000 UT) capsule, Take 1 capsule (50,000 Units total) by mouth once a week., Disp: 4 capsule, Rfl: 0 .  fluticasone (FLONASE) 50 MCG/ACT nasal spray, Place 2 sprays into both nostrils daily., Disp: 16 g, Rfl: 6 .  Levomilnacipran HCl (FETZIMA PO), Take 2 capsules by mouth daily. 80mg , Disp: , Rfl:  .  phenazopyridine (PYRIDIUM) 100 MG tablet, Take 1 tablet (100 mg total) by mouth 3 (three) times daily  as needed for pain., Disp: 9 tablet, Rfl: 0 .  tamsulosin (FLOMAX) 0.4 MG CAPS capsule, Take 0.4 mg by mouth daily. , Disp: , Rfl:  .  traZODone (DESYREL) 50 MG tablet, Take 50 mg by mouth at bedtime. , Disp: , Rfl:   EXAM:  VITALS per patient if applicable:   General impression: alert, cooperative and articulate.  No signs of being in distress  Lungs: speech is fluent sentence length suggests that patient is not short of breath and not punctuated by cough, sneezing or sniffing. Marland Kitchen   Psych: affect normal.  speech is articulate and non pressured .  Thought processing intact.   ASSESSMENT AND PLAN:  Discussed the following assessment and plan:  Frequent urination - Plan: Urine Culture, Urinalysis, Routine w reflex microscopic (not at [facility])  Mixed stress and urge urinary incontinence  Autonomous neurogenic bladder  Autonomous neurogenic bladder With unacceptable MUI.   Requiring use of 7 to 8 pads per day.  There is no sign of UTI on today's labs.  I reviewed prior imaging studies and office notes from her urologist. Patient has no prior trial of medications for OAB,  And was advised to discuss all available appropriate options (both pharmacologic and physical ) treatments with urologist , including BoTox injections,  Sacral nerve stimulation, and mid urethral sling.      I discussed the assessment and treatment plan with the patient. The patient was provided an opportunity to ask questions and all were answered. The patient agreed with the plan and demonstrated an understanding of the instructions.   The patient was advised to call back or seek an in-person evaluation if the symptoms worsen or if the condition fails to improve as anticipated.    Crecencio Mc, MD

## 2019-06-23 LAB — URINE CULTURE
MICRO NUMBER:: 10022705
SPECIMEN QUALITY:: ADEQUATE

## 2019-06-24 DIAGNOSIS — N319 Neuromuscular dysfunction of bladder, unspecified: Secondary | ICD-10-CM | POA: Insufficient documentation

## 2019-06-24 NOTE — Assessment & Plan Note (Addendum)
With unacceptable MUI.   Requiring use of 7 to 8 pads per day.  There is no sign of UTI on today's labs.  I reviewed prior imaging studies and office notes from her urologist. Patient has no prior trial of medications for OAB,  And was advised to discuss all available appropriate options (both pharmacologic and physical ) treatments with urologist , including BoTox injections,  Sacral nerve stimulation, and mid urethral sling.

## 2019-06-28 NOTE — Telephone Encounter (Signed)
Insurance verification for Prolia filed on [employer] Portal. 

## 2019-07-27 NOTE — Telephone Encounter (Signed)
Holly [name] [place]: [id number] - PA Case ID: [id number] New insurance requires PA.

## 2019-07-30 NOTE — Telephone Encounter (Signed)
Pa Approved patient scheduled for 08/09/19

## 2019-07-31 DIAGNOSIS — F3342 Major depressive disorder, recurrent, in full remission: Secondary | ICD-10-CM | POA: Diagnosis not present

## 2019-07-31 NOTE — Telephone Encounter (Signed)
[facility] [place]: [id number] - PA Case ID: [id number] Need help? Call us at [phone] Outcome Approvedon February 13 PA Case: [id number], Status: Approved, Coverage Starts on: 07/27/2019 12:00:00 AM, Coverage Ends on: 06/13/2020 12:00:00 AM. Questions? Contact [phone].

## 2019-08-03 ENCOUNTER — Ambulatory Visit
Admission: RE | Admit: 2019-08-03 | Discharge: 2019-08-03 | Disposition: A | Discharge status: Home / Self Care | Payer: Medicare PPO | Source: Ambulatory Visit | Attending: Internal Medicine | Admitting: Internal Medicine

## 2019-08-03 DIAGNOSIS — Z1231 Encounter for screening mammogram for malignant neoplasm of breast: Secondary | ICD-10-CM | POA: Insufficient documentation

## 2019-08-09 ENCOUNTER — Other Ambulatory Visit: Payer: Self-pay

## 2019-08-09 ENCOUNTER — Ambulatory Visit (INDEPENDENT_AMBULATORY_CARE_PROVIDER_SITE_OTHER): Payer: Medicare PPO

## 2019-08-09 DIAGNOSIS — M810 Age-related osteoporosis without current pathological fracture: Secondary | ICD-10-CM

## 2019-08-09 MED ORDER — DENOSUMAB 60 MG/ML ~~LOC~~ SOSY
60.0000 mg | PREFILLED_SYRINGE | Freq: Once | SUBCUTANEOUS | Status: AC
  Administered 2019-08-09: 11:00:00 60 mg via SUBCUTANEOUS

## 2019-08-09 NOTE — Progress Notes (Signed)
Patient presented for 6-month Prolia injection SQ to left arm. Patient tolerated well. 

## 2019-08-16 DIAGNOSIS — F329 Major depressive disorder, single episode, unspecified: Secondary | ICD-10-CM | POA: Diagnosis not present

## 2019-08-16 DIAGNOSIS — N3946 Mixed incontinence: Secondary | ICD-10-CM | POA: Diagnosis not present

## 2019-08-16 DIAGNOSIS — R339 Retention of urine, unspecified: Secondary | ICD-10-CM | POA: Diagnosis not present

## 2019-08-16 DIAGNOSIS — R3916 Straining to void: Secondary | ICD-10-CM | POA: Diagnosis not present

## 2019-08-16 DIAGNOSIS — N952 Postmenopausal atrophic vaginitis: Secondary | ICD-10-CM | POA: Diagnosis not present

## 2019-08-16 DIAGNOSIS — Z79899 Other long term (current) drug therapy: Secondary | ICD-10-CM | POA: Diagnosis not present

## 2019-08-16 DIAGNOSIS — F419 Anxiety disorder, unspecified: Secondary | ICD-10-CM | POA: Diagnosis not present

## 2019-08-16 DIAGNOSIS — Z87440 Personal history of urinary (tract) infections: Secondary | ICD-10-CM | POA: Diagnosis not present

## 2019-08-16 DIAGNOSIS — Z87891 Personal history of nicotine dependence: Secondary | ICD-10-CM | POA: Diagnosis not present

## 2019-08-23 DIAGNOSIS — J3489 Other specified disorders of nose and nasal sinuses: Secondary | ICD-10-CM | POA: Diagnosis not present

## 2019-08-23 DIAGNOSIS — M950 Acquired deformity of nose: Secondary | ICD-10-CM | POA: Diagnosis not present

## 2019-09-03 DIAGNOSIS — F3342 Major depressive disorder, recurrent, in full remission: Secondary | ICD-10-CM | POA: Diagnosis not present

## 2019-10-31 DIAGNOSIS — N3946 Mixed incontinence: Secondary | ICD-10-CM | POA: Diagnosis not present

## 2019-10-31 DIAGNOSIS — R531 Weakness: Secondary | ICD-10-CM | POA: Diagnosis not present

## 2019-11-05 DIAGNOSIS — F3342 Major depressive disorder, recurrent, in full remission: Secondary | ICD-10-CM | POA: Diagnosis not present

## 2019-11-14 DIAGNOSIS — N3946 Mixed incontinence: Secondary | ICD-10-CM | POA: Diagnosis not present

## 2019-11-14 DIAGNOSIS — R531 Weakness: Secondary | ICD-10-CM | POA: Diagnosis not present

## 2019-11-22 ENCOUNTER — Ambulatory Visit (INDEPENDENT_AMBULATORY_CARE_PROVIDER_SITE_OTHER): Payer: Medicare PPO

## 2019-11-22 VITALS — Ht 66.0 in | Wt 163.0 lb

## 2019-11-22 DIAGNOSIS — Z0000 Encounter for general adult medical examination without abnormal findings: Secondary | ICD-10-CM | POA: Diagnosis not present

## 2019-11-22 NOTE — Patient Instructions (Addendum)
  Holly [name] , Thank you for taking time to come for your Medicare Wellness Visit. I appreciate your ongoing commitment to your health goals. Please review the following plan we discussed and let me know if I can assist you in the future.   These are the goals we discussed: Goals      Patient Stated   .  Follow up with Primary Care Provider (pt-stated)      Follow up with her counselor as directed    .  Increase physical activity (pt-stated)      Walk more for exercise       This is a list of the screening recommended for you and due dates:  Health Maintenance  Topic Date Due  . COVID-19 Vaccine (1) 12/08/2019*  . Tetanus Vaccine  11/21/2020*  . Mammogram  08/02/2020  . Cologuard (Stool DNA test)  04/12/2022  . DEXA scan (bone density measurement)  Completed  .  Hepatitis C: One time screening is recommended by Center for Disease Control  (CDC) for  adults born from [birth year] through 1963.   Completed  *Topic was postponed. The date shown is not the original due date.

## 2019-11-22 NOTE — Progress Notes (Addendum)
Subjective:   Holly [name] is a 74 y.o. female who presents for Medicare Annual (Subsequent) preventive examination.  Review of Systems:  No ROS.  Medicare Wellness Virtual Visit.       Objective:     Vitals: Ht 5\' 6"  (1.676 m)   Wt 163 lb (73.9 kg)   BMI 26.31 kg/m   Body mass index is 26.31 kg/m.  Advanced Directives 11/22/2019 11/21/2018 10/22/2018 04/08/2018 09/29/2017 05/24/2017 04/15/2017  Does Patient Have a Medical Advance Directive? Yes Yes Yes Yes Yes Yes Yes  Type of Paramedic of [place];Living will [place];Living will [place];Living will Healthcare Power of [place];Living will Living will Living will  Does patient want to make changes to medical advance directive? No - Patient declined No - Patient declined - - No - Patient declined - -  Copy of [place] in Chart? No - copy requested No - copy requested No - copy requested - No - copy requested - -  Would patient like information on creating a medical advance directive? - - - No - Patient declined - - -    Tobacco Social History   Tobacco Use  Smoking Status Former Smoker  . Packs/day: 0.20  . Types: Cigarettes  . Quit date: 06/14/1977  . Years since quitting: 42.4  Smokeless Tobacco Never Used     Counseling given: Not Answered   Clinical Intake:  Pre-visit preparation completed: Yes        Diabetes: No  How often do you need to have someone help you when you read instructions, pamphlets, or other written materials from your doctor or pharmacy?: 1 - Never  Interpreter Needed?: No     Past Medical History:  Diagnosis Date  . Anxiety   . Depression   . Guillain Barr syndrome ([place]) 2005  . Osteoporosis   . Squamous cell skin cancer 2011   Past Surgical History:  Procedure Laterality Date  . ABDOMINAL HYSTERECTOMY  1989  . APPENDECTOMY  1960  . BREAST SURGERY  1980   biopsy    . WRIST FRACTURE SURGERY Right 2015   Family History  Problem Relation Age of Onset  . Cancer Mother   . Mental illness Sister   . Bipolar disorder Sister   . Depression Brother   . Mental illness Brother   . Parkinson's disease Brother   . Dystonia Brother   . Anxiety disorder Daughter    Social History   Socioeconomic History  . Marital status: Widowed    Spouse name: Not on file  . Number of children: Not on file  . Years of education: Not on file  . Highest education level: Not on file  Occupational History  . Not on file  Tobacco Use  . Smoking status: Former Smoker    Packs/day: 0.20    Types: Cigarettes    Quit date: 06/14/1977    Years since quitting: 42.4  . Smokeless tobacco: Never Used  Vaping Use  . Vaping Use: Never used  Substance and Sexual Activity  . Alcohol use: Never    Alcohol/week: 0.0 standard drinks    Comment: social (about once a month)  . Drug use: No  . Sexual activity: Never  Other Topics Concern  . Not on file  Social History Narrative  . Not on file   Social Determinants of Health   Financial Resource Strain: Low Risk   . Difficulty  of Paying Living Expenses: Not hard at all  Food Insecurity: No Food Insecurity  . Worried About Charity fundraiser in the Last Year: Never true  . Ran Out of Food in the Last Year: Never true  Transportation Needs: No Transportation Needs  . Lack of Transportation (Medical): No  . Lack of Transportation (Non-Medical): No  Physical Activity: Unknown  . Days of Exercise per Week: 0 days  . Minutes of Exercise per Session: Not on file  Stress: No Stress Concern Present  . Feeling of Stress : Only a little  Social Connections: Socially Isolated  . Frequency of Communication with Friends and Family: More than three times a week  . Frequency of Social Gatherings with Friends and Family: Never  . Attends Religious Services: Never  . Active Member of Clubs or Organizations: No  . Attends Theatre manager Meetings: Never  . Marital Status: Widowed    Outpatient Encounter Medications as of 11/22/2019  Medication Sig  . ARIPiprazole (ABILIFY) 5 MG tablet TAKE 1 TABLET BY MOUTH EVERY DAY IN THE MORNING  . clorazepate (TRANXENE) 7.5 MG tablet Take 7.5 mg by mouth 3 (three) times daily.   . ergocalciferol (DRISDOL) 1.25 MG (50000 UT) capsule Take 1 capsule (50,000 Units total) by mouth once a week.  . fluticasone (FLONASE) 50 MCG/ACT nasal spray Place 2 sprays into both nostrils daily.  . Levomilnacipran HCl (FETZIMA PO) Take 2 capsules by mouth daily. 80mg   . phenazopyridine (PYRIDIUM) 100 MG tablet Take 1 tablet (100 mg total) by mouth 3 (three) times daily as needed for pain.  . tamsulosin (FLOMAX) 0.4 MG CAPS capsule Take 0.4 mg by mouth daily.   . traZODone (DESYREL) 50 MG tablet Take 50 mg by mouth at bedtime.    No facility-administered encounter medications on file as of 11/22/2019.    Activities of Daily Living In your present state of health, do you have any difficulty performing the following activities: 11/22/2019  Hearing? N  Vision? N  Difficulty concentrating or making decisions? N  Walking or climbing stairs? N  Dressing or bathing? N  Doing errands, shopping? N  Preparing Food and eating ? N  Using the Toilet? N  In the past six months, have you accidently leaked urine? Y  Comment Taking flomax as directed. Managed with daily liner.  Do you have problems with loss of bowel control? N  Managing your Medications? N  Managing your Finances? N  Housekeeping or managing your Housekeeping? N  Some recent data might be hidden    Patient Care Team: Crecencio Mc, MD as PCP - General (Internal Medicine)    Assessment:   This is a routine wellness examination for Holly [name].  I connected with Holly [name] today by telephone and verified that I am speaking with the correct person using two identifiers. Location patient: home Location provider: work Persons participating  in the virtual visit: patient, Marine scientist.    I discussed the limitations, risks, security and privacy concerns of performing an evaluation and management service by telephone and the availability of in person appointments. The patient expressed understanding and verbally consented to this telephonic visit.    Interactive audio and video telecommunications were attempted between this provider and patient, however failed, due to patient having technical difficulties OR patient did not have access to video capability.  We continued and completed visit with audio only.  Some vital signs may be absent or patient reported.   Health Maintenance Due: -Covid and  Tdap vaccine declined.  See completed HM at the end of note.   Eye: Visual acuity not assessed. Virtual visit. Followed by their ophthalmologist.  Dental: Visits every 6 months.    Hearing: Demonstrates normal hearing during visit.  Safety:  Patient feels safe at home- yes Patient does have smoke detectors at home- yes Patient does wear sunscreen or protective clothing when in direct sunlight - yes Patient does wear seat belt when in a moving vehicle - yes Patient drives- yes Adequate lighting in walkways free from debris- yes Grab bars and handrails used as appropriate- yes Ambulates with an assistive device- no Cell phone on person when ambulating outside of the home- yes  Social: Alcohol intake - no  Smoking history- former   Smokers in home? none Illicit drug use? none  Medication: Taking as directed and without issues.  Pill box in use -yes  Self managed - yes   Covid-19: Precautions and sickness symptoms discussed. Wears mask, social distancing, hand hygiene as appropriate.   Activities of Daily Living Patient denies needing assistance with: household chores, feeding themselves, getting from bed to chair, getting to the toilet, bathing/showering, dressing, managing money, or preparing meals.   Discussed the  importance of a healthy diet, water intake and the benefits of aerobic exercise.   Physical activity- no routine, active around the home.  Diet:  Regular Water: 16 ounces daily Caffeine: 1 cup of coffee  Other Providers Patient Care Team: Crecencio Mc, MD as PCP - General (Internal Medicine)  Exercise Activities and Dietary recommendations Current Exercise Habits: The patient does not participate in regular exercise at present  Goals      Patient Stated   .  Follow up with Primary Care Provider (pt-stated)      Follow up with her counselor as directed    .  Increase physical activity (pt-stated)      Walk more for exercise       Fall Risk Fall Risk  11/22/2019 06/22/2019 03/27/2019 01/31/2019 11/21/2018  Falls in the past year? 0 1 1 1 1   Number falls in past yr: - 1 1 1 1   Injury with Fall? - 1 1 1 1   Comment - - - - Sought medical care  Risk Factor Category  - - - - -  Risk for fall due to : - - History of fall(s) - -  Risk for fall due to: Comment - - - - -  Follow up Falls evaluation completed Falls evaluation completed Falls evaluation completed Falls evaluation completed -   Is the patient's home free of loose throw rugs in walkways, pet beds, electrical cords, etc?  Yes      Grab bars in the bathroom? Yes      Handrails on the stairs?  Yes      Adequate lighting? Yes  Timed Get Up and Go performed: No, virtual visit.  Depression Screen PHQ 2/9 Scores 11/22/2019 01/31/2019 11/21/2018 10/14/2017  PHQ - 2 Score - 1 6 2   PHQ- 9 Score - 1 23 11   Exception Documentation Other- indicate reason in comment box - - -  Not completed Visits with psychiatrist every 4 months - - -     Cognitive Function MMSE - Mini Mental State Exam 11/21/2018 09/29/2017 09/15/2015  Not completed: Unable to complete Unable to complete -  Orientation to time - - 5  Orientation to Place - - 5  Registration - - 3  Attention/ Calculation - - 5  Recall - - 3  Language- name 2 objects - - 2    Language- repeat - - 1  Language- follow 3 step command - - 3  Language- read & follow direction - - 1  Write a sentence - - 1  Copy design - - 1  Total score - - 30        Immunization History  Administered Date(s) Administered  . Td 06/14/2006   Screening Tests Health Maintenance  Topic Date Due  . COVID-19 Vaccine (1) 12/08/2019 (Originally 01/26/1958)  . TETANUS/TDAP  11/21/2020 (Originally 06/14/2016)  . MAMMOGRAM  08/02/2020  . Fecal DNA (Cologuard)  04/12/2022  . DEXA SCAN  Completed  . Hepatitis C Screening  Completed      Plan:   Keep all routine maintenance appointments.   Follow up 11/27/19 @ 1:00  Medicare Attestation I have personally reviewed: The patient's medical and social history Their use of alcohol, tobacco or illicit drugs Their current medications and supplements The patient's functional ability including ADLs,fall risks, home safety risks, cognitive, and hearing and visual impairment Diet and physical activities Evidence for depression   I have reviewed and discussed with patient certain preventive protocols, quality metrics, and best practice recommendations.      OBrien-Blaney, Benard Minturn L, LPN  01/06/2034   I have reviewed the above information and agree with above.   Deborra Medina, MD

## 2019-11-27 ENCOUNTER — Ambulatory Visit (INDEPENDENT_AMBULATORY_CARE_PROVIDER_SITE_OTHER): Payer: Medicare PPO | Admitting: Internal Medicine

## 2019-11-27 ENCOUNTER — Encounter: Payer: Self-pay | Admitting: Internal Medicine

## 2019-11-27 ENCOUNTER — Other Ambulatory Visit: Payer: Self-pay

## 2019-11-27 VITALS — BP 130/76 | HR 89 | Temp 97.8°F | Resp 16 | Ht 66.0 in | Wt 165.4 lb

## 2019-11-27 DIAGNOSIS — Z79899 Other long term (current) drug therapy: Secondary | ICD-10-CM

## 2019-11-27 DIAGNOSIS — E559 Vitamin D deficiency, unspecified: Secondary | ICD-10-CM | POA: Diagnosis not present

## 2019-11-27 DIAGNOSIS — G2581 Restless legs syndrome: Secondary | ICD-10-CM

## 2019-11-27 LAB — CBC WITH DIFFERENTIAL/PLATELET
Basophils Absolute: 0.1 10*3/uL (ref 0.0–0.1)
Basophils Relative: 1.1 % (ref 0.0–3.0)
Eosinophils Absolute: 0.1 10*3/uL (ref 0.0–0.7)
Eosinophils Relative: 1.3 % (ref 0.0–5.0)
HCT: 33.9 % — ABNORMAL LOW (ref 36.0–46.0)
Hemoglobin: 11.5 g/dL — ABNORMAL LOW (ref 12.0–15.0)
Lymphocytes Relative: 24.4 % (ref 12.0–46.0)
Lymphs Abs: 1.4 10*3/uL (ref 0.7–4.0)
MCHC: 34.1 g/dL (ref 30.0–36.0)
MCV: 92.9 fl (ref 78.0–100.0)
Monocytes Absolute: 0.4 10*3/uL (ref 0.1–1.0)
Monocytes Relative: 6.2 % (ref 3.0–12.0)
Neutro Abs: 3.9 10*3/uL (ref 1.4–7.7)
Neutrophils Relative %: 67 % (ref 43.0–77.0)
Platelets: 227 10*3/uL (ref 150.0–400.0)
RBC: 3.65 Mil/uL — ABNORMAL LOW (ref 3.87–5.11)
RDW: 13.3 % (ref 11.5–15.5)
WBC: 5.8 10*3/uL (ref 4.0–10.5)

## 2019-11-27 LAB — COMPREHENSIVE METABOLIC PANEL
ALT: 26 U/L (ref 0–35)
AST: 19 U/L (ref 0–37)
Albumin: 4.4 g/dL (ref 3.5–5.2)
Alkaline Phosphatase: 41 U/L (ref 39–117)
BUN: 23 mg/dL (ref 6–23)
CO2: 30 mEq/L (ref 19–32)
Calcium: 9.3 mg/dL (ref 8.4–10.5)
Chloride: 106 mEq/L (ref 96–112)
Creatinine, Ser: 0.79 mg/dL (ref 0.40–1.20)
GFR: 71.57 mL/min (ref >60.00)
Glucose, Bld: 87 mg/dL (ref 70–99)
Potassium: 3.9 mEq/L (ref 3.5–5.1)
Sodium: 140 mEq/L (ref 135–145)
Total Bilirubin: 0.4 mg/dL (ref 0.2–1.2)
Total Protein: 7.1 g/dL (ref 6.0–8.3)

## 2019-11-27 LAB — VITAMIN D 25 HYDROXY (VIT D DEFICIENCY, FRACTURES): VITD: 32.6 ng/mL (ref 30.00–100.00)

## 2019-11-27 NOTE — Progress Notes (Addendum)
Subjective:  Patient ID: Holly [name], female    DOB: Oct 13, 1945  Age: 74 y.o. MRN: [id number]  CC: The primary encounter diagnosis was Restless legs. Diagnoses of Vitamin D deficiency and Long-term use of high-risk medication were also pertinent to this visit.  HPI Holly [name] presents for evaluation of restless legs. Starts when she lies down. . 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 Her other brother passed away while taking medications that caused Parkinsonian syndrome (he had) schizophrenia   Jan 2020 fall with fractured nose.  Sched[facility]  [facility] Oct 2021.  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 In the interim, she tried Claritin and Astelin nasal spray with no relief. 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 She is 90% obstructed  Dr Tat has advised her not to do it for unclear reasons.    Outpatient Medications Prior to Visit  Medication Sig Dispense Refill  . ARIPiprazole (ABILIFY) 5 MG tablet TAKE 1 TABLET BY MOUTH EVERY DAY IN THE MORNING    . clorazepate (TRANXENE) 7.5 MG tablet Take 7.5 mg by mouth 3 (three) times daily.     . ergocalciferol (DRISDOL) 1.25 MG (50000 UT) capsule Take 1 capsule (50,000 Units total) by mouth once a week. (Patient not taking: Reported on 11/27/2019) 4 capsule 0  . fluticasone (FLONASE) 50 MCG/ACT nasal spray Place 2 sprays into both nostrils daily. 16 g 6  . Levomilnacipran HCl (FETZIMA PO) Take 2 capsules by mouth daily. 80mg     . phenazopyridine (PYRIDIUM) 100 MG tablet Take 1 tablet (100 mg total) by mouth 3 (three) times daily as needed for pain. 9 tablet 0  . tamsulosin (FLOMAX) 0.4 MG CAPS capsule Take 0.4 mg by mouth daily.     . traZODone (DESYREL) 50 MG tablet Take 50 mg by mouth at  bedtime.      No facility-administered medications prior to visit.    Review of Systems;  Patient denies headache, fevers, malaise, unintentional weight loss, skin rash, eye pain, sinus congestion and sinus pain, sore throat, dysphagia,  hemoptysis , cough, dyspnea, wheezing, chest pain, palpitations, orthopnea, edema, abdominal pain, nausea, melena, diarrhea, constipation, flank pain, dysuria, hematuria, urinary  Frequency, nocturia, numbness, tingling, seizures,  Focal weakness, Loss of consciousness,  Tremor, insomnia, depression, anxiety, and suicidal ideation.      Objective:  BP 130/76   Pulse 89   Temp 97.8 F (36.6 C)   Resp 16   Ht 5\' 6"  (1.676 m)   Wt 165 lb 6.4 oz (75 kg)   SpO2 98%   BMI 26.70 kg/m   BP Readings from Last 3 Encounters:  11/27/19 130/76  03/27/19 128/72  10/22/18 (!) 178/90    Wt Readings from Last 3 Encounters:  11/27/19 165 lb 6.4 oz (75 kg)  11/22/19 163 lb (73.9 kg)  06/22/19 163 lb (73.9 kg)    General appearance: alert, cooperative and appears stated age Ears: normal TM's and external ear canals both ears Throat: lips, mucosa, and tongue normal; teeth and gums normal Neck: no adenopathy, no carotid bruit, supple, symmetrical, trachea midline and thyroid not enlarged, symmetric, no tenderness/mass/nodules Back: symmetric, no curvature. ROM normal. No CVA tenderness. Lungs: clear to auscultation bilaterally Heart: regular rate  and rhythm, S1, S2 normal, no murmur, click, rub or gallop Abdomen: soft, non-tender; bowel sounds normal; no masses,  no organomegaly Pulses: 2+ and symmetric Skin: Skin color, texture, turgor normal. No rashes or lesions Lymph nodes: Cervical, supraclavicular, and axillary nodes normal. Neuro: CNs 2-12 intact. DTRs 2+/4 in biceps, brachioradialis, patellars and achilles. Muscle strength 5/5 in upper and lower exremities. Fine resting tremor bilaterally both hands cerebellar function normal. Romberg negative.  No  pronator drift.   Gait normal.   No results found for: HGBA1C  Lab Results  Component Value Date   CREATININE 0.79 11/27/2019   CREATININE 0.93 03/27/2019   CREATININE 0.75 10/14/2017    Lab Results  Component Value Date   WBC 5.8 11/27/2019   HGB 11.5 (L) 11/27/2019   HCT 33.9 (L) 11/27/2019   PLT 227.0 11/27/2019   GLUCOSE 87 11/27/2019   CHOL 224 (H) 03/27/2019   TRIG 61.0 03/27/2019   HDL 89.10 03/27/2019   LDLDIRECT 129.0 06/30/2015   LDLCALC 123 (H) 03/27/2019   ALT 26 11/27/2019   AST 19 11/27/2019   NA 140 11/27/2019   K 3.9 11/27/2019   CL 106 11/27/2019   CREATININE 0.79 11/27/2019   BUN 23 11/27/2019   CO2 30 11/27/2019   TSH 1.53 03/27/2019   INR 0.9 12/24/2012    MM 3D SCREEN BREAST BILATERAL  Result Date: 08/06/2019 CLINICAL DATA:  Screening. EXAM: DIGITAL SCREENING BILATERAL MAMMOGRAM WITH TOMO AND CAD COMPARISON:  Previous exam(s). ACR Breast Density Category b: There are scattered areas of fibroglandular density. FINDINGS: There are no findings suspicious for malignancy. Images were processed with CAD. IMPRESSION: No mammographic evidence of malignancy. A result letter of this screening mammogram will be mailed directly to the patient. RECOMMENDATION: Screening mammogram in one year. (Code:SM-B-01Y) BI-RADS CATEGORY  1: Negative. Electronically Signed   By: Ammie Ferrier M.D.   On: 08/06/2019 08:57    Assessment & Plan:   Problem List Items Addressed This Visit      Unprioritized   Restless legs - Primary    No signs of iron deficiency.  Her brother has a family history of PD.  Will start medication      Relevant Orders   Iron, TIBC and Ferritin Panel (Completed)   CBC with Differential/Platelet (Completed)    Other Visit Diagnoses    Vitamin D deficiency       Relevant Orders   VITAMIN D 25 Hydroxy (Vit-D Deficiency, Fractures) (Completed)   Long-term use of high-risk medication       Relevant Orders   Comprehensive metabolic panel  (Completed)     I provided  30 minutes of  face-to-face time during this encounter reviewing patient's current problems and past surgeries, labs and imaging studies, providing counseling on the above mentioned problems , and coordination  of care .  I am having Cephus Shelling start on rOPINIRole. I am also having her maintain her clorazepate, Levomilnacipran HCl (FETZIMA PO), fluticasone, tamsulosin, phenazopyridine, traZODone, ARIPiprazole, and ergocalciferol.  Meds ordered this encounter  Medications  . rOPINIRole (REQUIP) 0.25 MG tablet    Sig: Take 1 tablet (0.25 mg total) by mouth at bedtime. Increase weekly as needed to control symptoms    Dispense:  90 tablet    Refill:  1    There are no discontinued medications.  Follow-up: No follow-ups on file.   Crecencio Mc, MD

## 2019-11-27 NOTE — Assessment & Plan Note (Addendum)
No signs of iron deficiency.  Her brother has a family history of PD.  Will start medication

## 2019-11-27 NOTE — Patient Instructions (Signed)
If your iron stores are normal we'll start a medication for Restless legs  Take the medication 30 mintues before bedtime  .  Reduce trazodone dose to 1/2 .  If still wide awake in an hour,  take the other half of trazodone dose  The restless legs medication can be increased once a week until the dose works!  So touch base with me in 2 weeks via mychart   Continue 1000 Ius of D3 daily for now.    Restless Legs Syndrome Restless legs syndrome is a condition that causes uncomfortable feelings or sensations in the legs, especially while sitting or lying down. The sensations usually cause an overwhelming urge to move the legs. The arms can also sometimes be affected. The condition can range from mild to severe. The symptoms often interfere with a person's ability to sleep. What are the causes? The cause of this condition is not known. What increases the risk? The following factors may make you more likely to develop this condition:  Being older than 50.  Pregnancy.  Being a woman. In general, the condition is more common in women than in men.  A family history of the condition.  Having iron deficiency.  Overuse of caffeine, nicotine, or alcohol.  Certain medical conditions, such as kidney disease, Parkinson's disease, or nerve damage.  Certain medicines, such as those for high blood pressure, nausea, colds, allergies, depression, and some heart conditions. What are the signs or symptoms? The main symptom of this condition is uncomfortable sensations in the legs, such as:  Pulling.  Tingling.  Prickling.  Throbbing.  Crawling.  Burning. Usually, the sensations:  Affect both sides of the body.  Are worse when you sit or lie down.  Are worse at night. These may wake you up or make it difficult to fall asleep.  Make you have a strong urge to move your legs.  Are temporarily relieved by moving your legs. The arms can also be affected, but this is rare. People who  have this condition often have tiredness during the day because of their lack of sleep at night. How is this diagnosed? This condition may be diagnosed based on:  Your symptoms.  Blood tests. In some cases, you may be monitored in a sleep lab by a specialist (a sleep study). This can detect any disruptions in your sleep. How is this treated? This condition is treated by managing the symptoms. This may include:  Lifestyle changes, such as exercising, using relaxation techniques, and avoiding caffeine, alcohol, or tobacco.  Medicines. Anti-seizure medicines may be tried first. Follow these instructions at home:     General instructions  Take over-the-counter and prescription medicines only as told by your health care provider.  Use methods to help relieve the uncomfortable sensations, such as: ? Massaging your legs. ? Walking or stretching. ? Taking a cold or hot bath.  Keep all follow-up visits as told by your health care provider. This is important. Lifestyle  Practice good sleep habits. For example, go to bed and get up at the same time every day. Most adults should get 7-9 hours of sleep each night.  Exercise regularly. Try to get at least 30 minutes of exercise most days of the week.  Practice ways of relaxing, such as yoga or meditation.  Avoid caffeine and alcohol.  Do not use any products that contain nicotine or tobacco, such as cigarettes and e-cigarettes. If you need help quitting, ask your health care provider. Contact a health care  provider if:  Your symptoms get worse or they do not improve with treatment. Summary  Restless legs syndrome is a condition that causes uncomfortable feelings or sensations in the legs, especially while sitting or lying down.  The symptoms often interfere with a person's ability to sleep.  This condition is treated by managing the symptoms. You may need to make lifestyle changes or take medicines. This information is not intended  to replace advice given to you by your health care provider. Make sure you discuss any questions you have with your health care provider. Document Revised: 06/20/2017 Document Reviewed: 06/20/2017 Elsevier Patient Education  [place].

## 2019-11-28 LAB — IRON,TIBC AND FERRITIN PANEL
%SAT: 29 % (calc) (ref 16–45)
Ferritin: 44 ng/mL (ref 16–288)
Iron: 101 ug/dL (ref 45–160)
TIBC: 353 mcg/dL (calc) (ref 250–450)

## 2019-11-28 MED ORDER — ROPINIROLE HCL 0.25 MG PO TABS: 0.2500 mg | ORAL_TABLET | Freq: Every day | ORAL | 1 refills | Status: DC

## 2019-11-28 NOTE — Addendum Note (Signed)
Addended by: Crecencio Mc on: 11/28/2019 01:25 PM   Modules accepted: Orders

## 2019-12-26 DIAGNOSIS — R531 Weakness: Secondary | ICD-10-CM | POA: Diagnosis not present

## 2019-12-26 DIAGNOSIS — N3946 Mixed incontinence: Secondary | ICD-10-CM | POA: Diagnosis not present

## 2020-01-03 DIAGNOSIS — N3941 Urge incontinence: Secondary | ICD-10-CM | POA: Diagnosis not present

## 2020-01-03 DIAGNOSIS — N952 Postmenopausal atrophic vaginitis: Secondary | ICD-10-CM | POA: Diagnosis not present

## 2020-01-03 DIAGNOSIS — Z79899 Other long term (current) drug therapy: Secondary | ICD-10-CM | POA: Diagnosis not present

## 2020-01-03 DIAGNOSIS — N3946 Mixed incontinence: Secondary | ICD-10-CM | POA: Diagnosis not present

## 2020-01-03 DIAGNOSIS — N319 Neuromuscular dysfunction of bladder, unspecified: Secondary | ICD-10-CM | POA: Diagnosis not present

## 2020-01-03 DIAGNOSIS — F329 Major depressive disorder, single episode, unspecified: Secondary | ICD-10-CM | POA: Diagnosis not present

## 2020-01-03 DIAGNOSIS — Z87891 Personal history of nicotine dependence: Secondary | ICD-10-CM | POA: Diagnosis not present

## 2020-01-08 DIAGNOSIS — F419 Anxiety disorder, unspecified: Secondary | ICD-10-CM | POA: Diagnosis not present

## 2020-01-08 DIAGNOSIS — F330 Major depressive disorder, recurrent, mild: Secondary | ICD-10-CM | POA: Diagnosis not present

## 2020-01-16 ENCOUNTER — Telehealth: Payer: Self-pay | Admitting: Internal Medicine

## 2020-01-16 NOTE — Telephone Encounter (Signed)
Approvedon February 13 PA Case: [id number], Status: Approved, Coverage Starts on: 07/27/2019 12:00:00 AM, Coverage Ends on: 06/13/2020 12:00:00 AM. Questions? Contact [phone].   Patient scheduled 02/07/20 @ 0830

## 2020-01-21 DIAGNOSIS — F332 Major depressive disorder, recurrent severe without psychotic features: Secondary | ICD-10-CM | POA: Diagnosis not present

## 2020-01-21 DIAGNOSIS — F419 Anxiety disorder, unspecified: Secondary | ICD-10-CM | POA: Diagnosis not present

## 2020-01-28 DIAGNOSIS — F419 Anxiety disorder, unspecified: Secondary | ICD-10-CM | POA: Diagnosis not present

## 2020-01-28 DIAGNOSIS — F332 Major depressive disorder, recurrent severe without psychotic features: Secondary | ICD-10-CM | POA: Diagnosis not present

## 2020-02-07 ENCOUNTER — Other Ambulatory Visit: Payer: Self-pay

## 2020-02-07 ENCOUNTER — Ambulatory Visit (INDEPENDENT_AMBULATORY_CARE_PROVIDER_SITE_OTHER): Payer: Medicare PPO

## 2020-02-07 DIAGNOSIS — M810 Age-related osteoporosis without current pathological fracture: Secondary | ICD-10-CM | POA: Diagnosis not present

## 2020-02-07 MED ORDER — DENOSUMAB 60 MG/ML ~~LOC~~ SOSY
60.0000 mg | PREFILLED_SYRINGE | Freq: Once | SUBCUTANEOUS | Status: AC
  Administered 2020-02-07: 60 mg via SUBCUTANEOUS

## 2020-02-07 NOTE — Progress Notes (Signed)
Patient presented for 6-month Prolia injection SQ to left arm. Patient tolerated well. 

## 2020-02-13 DIAGNOSIS — F419 Anxiety disorder, unspecified: Secondary | ICD-10-CM | POA: Diagnosis not present

## 2020-02-13 DIAGNOSIS — F332 Major depressive disorder, recurrent severe without psychotic features: Secondary | ICD-10-CM | POA: Diagnosis not present

## 2020-03-13 DIAGNOSIS — F330 Major depressive disorder, recurrent, mild: Secondary | ICD-10-CM | POA: Diagnosis not present

## 2020-03-13 DIAGNOSIS — F419 Anxiety disorder, unspecified: Secondary | ICD-10-CM | POA: Diagnosis not present

## 2020-04-08 DIAGNOSIS — F330 Major depressive disorder, recurrent, mild: Secondary | ICD-10-CM | POA: Diagnosis not present

## 2020-04-10 DIAGNOSIS — F419 Anxiety disorder, unspecified: Secondary | ICD-10-CM | POA: Diagnosis not present

## 2020-04-10 DIAGNOSIS — F330 Major depressive disorder, recurrent, mild: Secondary | ICD-10-CM | POA: Diagnosis not present

## 2020-05-01 DIAGNOSIS — F419 Anxiety disorder, unspecified: Secondary | ICD-10-CM | POA: Diagnosis not present

## 2020-05-01 DIAGNOSIS — F330 Major depressive disorder, recurrent, mild: Secondary | ICD-10-CM | POA: Diagnosis not present

## 2020-05-15 DIAGNOSIS — F332 Major depressive disorder, recurrent severe without psychotic features: Secondary | ICD-10-CM | POA: Diagnosis not present

## 2020-05-15 DIAGNOSIS — F419 Anxiety disorder, unspecified: Secondary | ICD-10-CM | POA: Diagnosis not present

## 2020-05-22 DIAGNOSIS — F419 Anxiety disorder, unspecified: Secondary | ICD-10-CM | POA: Diagnosis not present

## 2020-05-22 DIAGNOSIS — F332 Major depressive disorder, recurrent severe without psychotic features: Secondary | ICD-10-CM | POA: Diagnosis not present

## 2020-06-05 DIAGNOSIS — F330 Major depressive disorder, recurrent, mild: Secondary | ICD-10-CM | POA: Diagnosis not present

## 2020-06-10 DIAGNOSIS — F332 Major depressive disorder, recurrent severe without psychotic features: Secondary | ICD-10-CM | POA: Diagnosis not present

## 2020-06-10 DIAGNOSIS — F419 Anxiety disorder, unspecified: Secondary | ICD-10-CM | POA: Diagnosis not present

## 2020-06-11 ENCOUNTER — Other Ambulatory Visit: Payer: Self-pay

## 2020-06-11 ENCOUNTER — Ambulatory Visit (INDEPENDENT_AMBULATORY_CARE_PROVIDER_SITE_OTHER): Payer: Medicare PPO | Admitting: Internal Medicine

## 2020-06-11 ENCOUNTER — Encounter: Payer: Self-pay | Admitting: Internal Medicine

## 2020-06-11 VITALS — BP 130/68 | HR 86 | Temp 97.6°F | Ht 66.0 in | Wt 175.0 lb

## 2020-06-11 DIAGNOSIS — M810 Age-related osteoporosis without current pathological fracture: Secondary | ICD-10-CM | POA: Diagnosis not present

## 2020-06-11 DIAGNOSIS — F331 Major depressive disorder, recurrent, moderate: Secondary | ICD-10-CM

## 2020-06-11 DIAGNOSIS — J300 Vasomotor rhinitis: Secondary | ICD-10-CM | POA: Diagnosis not present

## 2020-06-11 DIAGNOSIS — Z1231 Encounter for screening mammogram for malignant neoplasm of breast: Secondary | ICD-10-CM | POA: Diagnosis not present

## 2020-06-11 DIAGNOSIS — Z1211 Encounter for screening for malignant neoplasm of colon: Secondary | ICD-10-CM | POA: Diagnosis not present

## 2020-06-11 DIAGNOSIS — Z79899 Other long term (current) drug therapy: Secondary | ICD-10-CM | POA: Diagnosis not present

## 2020-06-11 DIAGNOSIS — G2111 Neuroleptic induced parkinsonism: Secondary | ICD-10-CM | POA: Diagnosis not present

## 2020-06-11 DIAGNOSIS — G2581 Restless legs syndrome: Secondary | ICD-10-CM

## 2020-06-11 DIAGNOSIS — Z8669 Personal history of other diseases of the nervous system and sense organs: Secondary | ICD-10-CM | POA: Diagnosis not present

## 2020-06-11 DIAGNOSIS — Z0000 Encounter for general adult medical examination without abnormal findings: Secondary | ICD-10-CM

## 2020-06-11 LAB — COMPREHENSIVE METABOLIC PANEL
ALT: 9 U/L (ref 0–35)
AST: 12 U/L (ref 0–37)
Albumin: 4.6 g/dL (ref 3.5–5.2)
Alkaline Phosphatase: 47 U/L (ref 39–117)
BUN: 19 mg/dL (ref 6–23)
CO2: 30 mEq/L (ref 19–32)
Calcium: 9.7 mg/dL (ref 8.4–10.5)
Chloride: 103 mEq/L (ref 96–112)
Creatinine, Ser: 0.98 mg/dL (ref 0.40–1.20)
GFR: 57.72 mL/min — ABNORMAL LOW (ref >60.00)
Glucose, Bld: 84 mg/dL (ref 70–99)
Potassium: 4.1 mEq/L (ref 3.5–5.1)
Sodium: 140 mEq/L (ref 135–145)
Total Bilirubin: 0.4 mg/dL (ref 0.2–1.2)
Total Protein: 7 g/dL (ref 6.0–8.3)

## 2020-06-11 LAB — CBC WITH DIFFERENTIAL/PLATELET
Basophils Absolute: 0 10*3/uL (ref 0.0–0.1)
Basophils Relative: 1 % (ref 0.0–3.0)
Eosinophils Absolute: 0.1 10*3/uL (ref 0.0–0.7)
Eosinophils Relative: 1.2 % (ref 0.0–5.0)
HCT: 36.1 % (ref 36.0–46.0)
Hemoglobin: 11.9 g/dL — ABNORMAL LOW (ref 12.0–15.0)
Lymphocytes Relative: 33.2 % (ref 12.0–46.0)
Lymphs Abs: 1.5 10*3/uL (ref 0.7–4.0)
MCHC: 33 g/dL (ref 30.0–36.0)
MCV: 92.2 fl (ref 78.0–100.0)
Monocytes Absolute: 0.4 10*3/uL (ref 0.1–1.0)
Monocytes Relative: 7.7 % (ref 3.0–12.0)
Neutro Abs: 2.6 10*3/uL (ref 1.4–7.7)
Neutrophils Relative %: 56.9 % (ref 43.0–77.0)
Platelets: 270 10*3/uL (ref 150.0–400.0)
RBC: 3.92 Mil/uL (ref 3.87–5.11)
RDW: 13.5 % (ref 11.5–15.5)
WBC: 4.6 10*3/uL (ref 4.0–10.5)

## 2020-06-11 LAB — LIPID PANEL
Cholesterol: 223 mg/dL — ABNORMAL HIGH (ref 0–200)
HDL: 87.5 mg/dL (ref >39.00)
LDL Cholesterol: 115 mg/dL — ABNORMAL HIGH (ref 0–99)
NonHDL: 135.55
Total CHOL/HDL Ratio: 3
Triglycerides: 101 mg/dL (ref 0.0–149.0)
VLDL: 20.2 mg/dL (ref 0.0–40.0)

## 2020-06-11 NOTE — Patient Instructions (Addendum)
I want you to find a place outside to go walking with daily   Start taking 2000 Ius of D3 daily (Or 50 mcg)  I recommend looking at the the [employer] diet if you decide not to use Weight watchers   Your annual mammogram is due In February  has been ordered.  You are encouraged (required) to call to make your appointment at [place]  [phone]  You do not need a script for the tetanus booster.  Any pharmacy can give you it.  You do not need the TDaP,  Just the tetanus

## 2020-06-11 NOTE — Progress Notes (Signed)
Patient ID: Holly [name], female    DOB: 1945/09/16  Age: 74 y.o. MRN: [id number]  The patient is here for annual  wellness examination and management of other chronic and acute problems.  This visit occurred during the SARS-CoV-2 public health emergency.  Safety protocols were in place, including screening questions prior to the visit, additional usage of staff PPE, and extensive cleaning of exam room while observing appropriate contact time as indicated for disinfecting solutions.     The risk factors are reflected in the social history.  The roster of all physicians providing medical care to patient - is listed in the Snapshot section of the chart.  Activities of daily living:  The patient is 100% independent in all ADLs: dressing, toileting, feeding as well as independent mobility  Home safety : The patient has smoke detectors in the home. They wear seatbelts.  There are no firearms at home. There is no violence in the home.   There is no risks for hepatitis, STDs or HIV. There is no   history of blood transfusion. They have no travel history to infectious disease endemic areas of the world.  The patient has seen their dentist in the last six month. They have seen their eye doctor in the last year. They admit to slight hearing difficulty with regard to whispered voices and some television programs.  They have deferred audiologic testing in the last year.  They do not  have excessive sun exposure. Discussed the need for sun protection: hats, long sleeves and use of sunscreen if there is significant sun exposure.   Diet: the importance of a healthy diet is discussed. They do have a healthy diet.  The benefits of regular aerobic exercise were discussed. She walks 4 times per week ,  20 minutes.   Depression screen: she is under treatment for depression by Dr Donell Beers .   Cognitive assessment: the patient manages all their financial and personal affairs and is actively engaged. They could  relate day,date,year and events; recalled 2/3 objects at 3 minutes; performed clock-face test normally.  The following portions of the patient's history were reviewed and updated as appropriate: allergies, current medications, past family history, past medical history,  past surgical history, past social history  and problem list.  Visual acuity was not assessed per patient preference since she has regular follow up with her ophthalmologist. Hearing and body mass index were assessed and reviewed.   During the course of the visit the patient was educated and counseled about appropriate screening and preventive services including : fall prevention , diabetes screening, nutrition counseling, colorectal cancer screening, and recommended immunizations.    CC: The primary encounter diagnosis was Long-term use of high-risk medication. Diagnoses of Breast cancer screening by mammogram, Recurrent depressive disorder, current episode moderate (HCC), Chronic vasomotor rhinitis, Encounter for preventive health examination, History of Guillain-Barre syndrome, Postmenopausal osteoporosis, Colon cancer screening, Neuroleptic-induced parkinsonism (HCC), and Restless legs were also pertinent to this visit.  Depression: she had an another acute decompensation after suspending her medication for 2 months due to side effects. She has resumed a regimen for the past 3 weeks.  EKG requested by her psychiatrist to monitor for arrhythmias on current therapy which includes  abilify, fetzima, tranxene and trazodone.   Brother passed away in 2023-02-26 of Parkinson's Disease at [facility].  Still has 2 remaining siblings:  sisters in [place] and [place]. Has not had grief counselling but sees her  regular counsellor through her psychiatrist [facility].  Having surgery in April for deviated septum causing constant congestion.  Mammogram due in November.   History [place]wa has a past medical history of Anxiety, Concussion with loss of  consciousness (04/10/2017), Depression, Guillain Barr syndrome ([place]) (2005), Osteoporosis, Radial fracture (01/23/2013), and Squamous cell skin cancer (2011).   She has a past surgical history that includes Breast surgery (1980); Appendectomy (1960); Abdominal hysterectomy (1989); and Wrist fracture surgery (Right, 2015).   Her family history includes Anxiety disorder in her daughter; Bipolar disorder in her sister; Cancer in her mother; Depression in her brother; Dystonia in her brother; Mental illness in her brother and sister; Parkinson's disease in her brother.She reports that she quit smoking about 43 years ago. Her smoking use included cigarettes. She smoked 0.20 packs per day. She has never used smokeless tobacco. She reports that she does not drink alcohol and does not use drugs.  Outpatient Medications Prior to Visit  Medication Sig Dispense Refill  . ARIPiprazole (ABILIFY) 5 MG tablet TAKE 1 TABLET BY MOUTH EVERY DAY IN THE MORNING    . Cholecalciferol (VITAMIN D) 50 MCG (2000 UT) CAPS Take by mouth.    . clorazepate (TRANXENE) 7.5 MG tablet Take 7.5 mg by mouth 3 (three) times daily.    . Levomilnacipran HCl (FETZIMA PO) Take 2 capsules by mouth daily. 80mg     . traZODone (DESYREL) 50 MG tablet Take 50 mg by mouth at bedtime.     . ergocalciferol (DRISDOL) 1.25 MG (50000 UT) capsule Take 1 capsule (50,000 Units total) by mouth once a week. (Patient not taking: No sig reported) 4 capsule 0  . fluticasone (FLONASE) 50 MCG/ACT nasal spray Place 2 sprays into both nostrils daily. (Patient not taking: Reported on 06/11/2020) 16 g 6  . phenazopyridine (PYRIDIUM) 100 MG tablet Take 1 tablet (100 mg total) by mouth 3 (three) times daily as needed for pain. 9 tablet 0  . rOPINIRole (REQUIP) 0.25 MG tablet Take 1 tablet (0.25 mg total) by mouth at bedtime. Increase weekly as needed to control symptoms 90 tablet 1  . tamsulosin (FLOMAX) 0.4 MG CAPS capsule Take 0.4 mg by mouth daily.      No  facility-administered medications prior to visit.    Review of Systems   Patient denies headache, fevers, malaise, unintentional weight loss, skin rash, eye pain, sinus congestion and sinus pain, sore throat, dysphagia,  hemoptysis , cough, dyspnea, wheezing, chest pain, palpitations, orthopnea, edema, abdominal pain, nausea, melena, diarrhea, constipation, flank pain, dysuria, hematuria, urinary  Frequency, nocturia, numbness, tingling, seizures,  Focal weakness, Loss of consciousness,  Tremor, insomnia, untreated depression, anxiety, and suicidal ideation.      Objective:  BP 130/68 (BP Location: Left Arm, Patient Position: Sitting, Cuff Size: Normal)   Pulse 86   Temp 97.6 F (36.4 C) (Oral)   Ht 5\' 6"  (1.676 m)   Wt 175 lb (79.4 kg)   SpO2 99%   BMI 28.25 kg/m   Physical Exam  General appearance: alert, cooperative and appears stated age Ears: normal TM's and external ear canals both ears Throat: lips, mucosa, and tongue normal; teeth and gums normal Neck: no adenopathy, no carotid bruit, supple, symmetrical, trachea midline and thyroid not enlarged, symmetric, no tenderness/mass/nodules Back: symmetric, no curvature. ROM normal. No CVA tenderness. Lungs: clear to auscultation bilaterally Heart: regular rate and rhythm, S1, S2 normal, no murmur, click, rub or gallop Abdomen: soft, non-tender; bowel sounds normal; no masses,  no organomegaly Pulses: 2+ and symmetric Skin: Skin color, texture,  turgor normal. No rashes or lesions Lymph nodes: Cervical, supraclavicular, and axillary nodes normal.  Assessment & Plan:   Problem List Items Addressed This Visit      Unprioritized   Chronic vasomotor rhinitis    With chronic congestion due to deviated septum.  Corrective surgery is planned this winter       Colon cancer screening    cologuard is due next in 2023      Encounter for preventive health examination    age appropriate education and counseling updated, referrals  for preventative services and immunizations addressed, dietary and smoking counseling addressed, most recent labs reviewed.  I have personally reviewed and have noted:  1) the patient's medical and social history 2) The pt's use of alcohol, tobacco, and illicit drugs 3) The patient's current medications and supplements 4) Functional ability including ADL's, fall risk, home safety risk, hearing and visual impairment 5) Diet and physical activities 6) Evidence for depression or mood disorder 7) The patient's height, weight, and BMI have been recorded in the chart  I have made referrals, and provided counseling and education based on review of the above      History of Guillain-Barre syndrome    Occurred after vaccination for flu.  For that reason she has continued to defer all recommended vaccinations       Neuroleptic-induced parkinsonism ([place])    Previously attributed to use of abilify.  Dr Casimiro Needle is managing her depression and has not discontinued the abilify.  Records from him are not available and have been requested         Postmenopausal osteoporosis    With confirmative T scores and history of recent wrist fracture in 2014 secondary to fall. Secondary to surgical menopause at age 38 followed by only 3 years of oral hormone therapy. Patient has deferred use of alendronate which I agree may be problematic given her history of Guillain-Barr affecting the bulbar muscles.   SHE is tolerating Prolia      Relevant Medications   Cholecalciferol (VITAMIN D) 50 MCG (2000 UT) CAPS   Recurrent depressive disorder, current episode moderate (HCC)   Restless legs    She did not tolerate ropinirole trial .  Symptoms are not currently bothersome.        Other Visit Diagnoses    Long-term use of high-risk medication    -  Primary   Relevant Orders   EKG 12-Lead   CBC with Differential/Platelet (Completed)   Comprehensive metabolic panel (Completed)   Lipid panel (Completed)   Breast  cancer screening by mammogram       Relevant Orders   MM 3D SCREEN BREAST BILATERAL      I have discontinued Kamsiyochukwu E. Nembhard's fluticasone, tamsulosin, phenazopyridine, ergocalciferol, and rOPINIRole. I am also having her maintain her clorazepate, Levomilnacipran HCl (FETZIMA PO), traZODone, ARIPiprazole, and Vitamin D.  No orders of the defined types were placed in this encounter.   Medications Discontinued During This Encounter  Medication Reason  . ergocalciferol (DRISDOL) 1.25 MG (50000 UT) capsule Change in therapy  . fluticasone (FLONASE) 50 MCG/ACT nasal spray No longer needed (for PRN medications)  . phenazopyridine (PYRIDIUM) 100 MG tablet Completed Course  . rOPINIRole (REQUIP) 0.25 MG tablet No longer needed (for PRN medications)  . tamsulosin (FLOMAX) 0.4 MG CAPS capsule Completed Course    Follow-up: No follow-ups on file.   Crecencio Mc, MD

## 2020-06-13 ENCOUNTER — Encounter: Payer: Self-pay | Admitting: Internal Medicine

## 2020-06-13 NOTE — Assessment & Plan Note (Signed)
Occurred after vaccination for flu.  For that reason she has continued to defer all recommended vaccinations  

## 2020-06-13 NOTE — Assessment & Plan Note (Signed)
With chronic congestion due to deviated septum.  Corrective surgery is planned this winter

## 2020-06-13 NOTE — Assessment & Plan Note (Signed)
cologuard is due next in 2023

## 2020-06-13 NOTE — Assessment & Plan Note (Signed)
She did not tolerate ropinirole trial .  Symptoms are not currently bothersome.

## 2020-06-13 NOTE — Assessment & Plan Note (Signed)
With confirmative T scores and history of recent wrist fracture in 2014 secondary to fall. Secondary to surgical menopause at age 74 followed by only 3 years of oral hormone therapy. Patient has deferred use of alendronate which I agree may be problematic given her history of Guillain-Barr affecting the bulbar muscles.   SHE is tolerating Prolia

## 2020-06-13 NOTE — Assessment & Plan Note (Addendum)
Previously attributed to use of abilify.  Dr Donell Beers is managing her depression and has not discontinued the abilify.  Records from him are not available and have been requested

## 2020-06-13 NOTE — Assessment & Plan Note (Signed)

## 2020-06-16 ENCOUNTER — Telehealth: Payer: Self-pay | Admitting: Internal Medicine

## 2020-06-16 NOTE — Telephone Encounter (Signed)
Pt called to let Dr. Darrick Huntsman  Know that the medication she is taking is Desipramine 25mg 

## 2020-06-17 NOTE — Telephone Encounter (Signed)
Updated medication list

## 2020-06-18 DIAGNOSIS — F332 Major depressive disorder, recurrent severe without psychotic features: Secondary | ICD-10-CM | POA: Diagnosis not present

## 2020-06-18 DIAGNOSIS — F419 Anxiety disorder, unspecified: Secondary | ICD-10-CM | POA: Diagnosis not present

## 2020-06-23 DIAGNOSIS — F332 Major depressive disorder, recurrent severe without psychotic features: Secondary | ICD-10-CM | POA: Diagnosis not present

## 2020-06-23 DIAGNOSIS — F419 Anxiety disorder, unspecified: Secondary | ICD-10-CM | POA: Diagnosis not present

## 2020-06-26 ENCOUNTER — Encounter: Payer: Self-pay | Admitting: Internal Medicine

## 2020-07-01 DIAGNOSIS — F332 Major depressive disorder, recurrent severe without psychotic features: Secondary | ICD-10-CM | POA: Diagnosis not present

## 2020-07-01 DIAGNOSIS — F419 Anxiety disorder, unspecified: Secondary | ICD-10-CM | POA: Diagnosis not present

## 2020-07-03 DIAGNOSIS — F332 Major depressive disorder, recurrent severe without psychotic features: Secondary | ICD-10-CM | POA: Diagnosis not present

## 2020-07-03 DIAGNOSIS — F419 Anxiety disorder, unspecified: Secondary | ICD-10-CM | POA: Diagnosis not present

## 2020-07-08 DIAGNOSIS — F332 Major depressive disorder, recurrent severe without psychotic features: Secondary | ICD-10-CM | POA: Diagnosis not present

## 2020-07-08 DIAGNOSIS — F419 Anxiety disorder, unspecified: Secondary | ICD-10-CM | POA: Diagnosis not present

## 2020-07-08 DIAGNOSIS — L570 Actinic keratosis: Secondary | ICD-10-CM | POA: Diagnosis not present

## 2020-07-16 DIAGNOSIS — F419 Anxiety disorder, unspecified: Secondary | ICD-10-CM | POA: Diagnosis not present

## 2020-07-16 DIAGNOSIS — F332 Major depressive disorder, recurrent severe without psychotic features: Secondary | ICD-10-CM | POA: Diagnosis not present

## 2020-07-17 ENCOUNTER — Telehealth: Payer: Self-pay | Admitting: Internal Medicine

## 2020-07-17 NOTE — Telephone Encounter (Signed)
[facility]: [id number] - [place] Case ID: [id number] initiated in CovermyMeds 07/17/20

## 2020-07-23 DIAGNOSIS — F330 Major depressive disorder, recurrent, mild: Secondary | ICD-10-CM | POA: Diagnosis not present

## 2020-07-23 DIAGNOSIS — F3342 Major depressive disorder, recurrent, in full remission: Secondary | ICD-10-CM | POA: Diagnosis not present

## 2020-07-31 DIAGNOSIS — R3915 Urgency of urination: Secondary | ICD-10-CM | POA: Diagnosis not present

## 2020-07-31 DIAGNOSIS — N3941 Urge incontinence: Secondary | ICD-10-CM | POA: Diagnosis not present

## 2020-07-31 DIAGNOSIS — N958 Other specified menopausal and perimenopausal disorders: Secondary | ICD-10-CM | POA: Diagnosis not present

## 2020-08-04 DIAGNOSIS — F330 Major depressive disorder, recurrent, mild: Secondary | ICD-10-CM | POA: Diagnosis not present

## 2020-08-06 ENCOUNTER — Other Ambulatory Visit: Payer: Self-pay

## 2020-08-06 ENCOUNTER — Ambulatory Visit
Admission: RE | Admit: 2020-08-06 | Discharge: 2020-08-06 | Disposition: A | Discharge status: Home / Self Care | Payer: Medicare PPO | Source: Ambulatory Visit | Attending: Internal Medicine | Admitting: Internal Medicine

## 2020-08-06 DIAGNOSIS — Z1231 Encounter for screening mammogram for malignant neoplasm of breast: Secondary | ICD-10-CM | POA: Insufficient documentation

## 2020-08-07 DIAGNOSIS — F330 Major depressive disorder, recurrent, mild: Secondary | ICD-10-CM | POA: Diagnosis not present

## 2020-08-07 DIAGNOSIS — F419 Anxiety disorder, unspecified: Secondary | ICD-10-CM | POA: Diagnosis not present

## 2020-08-11 ENCOUNTER — Other Ambulatory Visit: Payer: Self-pay

## 2020-08-11 ENCOUNTER — Encounter: Payer: Self-pay | Admitting: Internal Medicine

## 2020-08-11 ENCOUNTER — Ambulatory Visit: Payer: Medicare PPO | Admitting: Internal Medicine

## 2020-08-11 ENCOUNTER — Telehealth: Payer: Self-pay

## 2020-08-11 VITALS — BP 124/70 | HR 90 | Temp 98.1°F | Resp 16 | Ht 66.0 in | Wt 168.0 lb

## 2020-08-11 DIAGNOSIS — M810 Age-related osteoporosis without current pathological fracture: Secondary | ICD-10-CM

## 2020-08-11 DIAGNOSIS — J300 Vasomotor rhinitis: Secondary | ICD-10-CM | POA: Diagnosis not present

## 2020-08-11 DIAGNOSIS — G2111 Neuroleptic induced parkinsonism: Secondary | ICD-10-CM

## 2020-08-11 DIAGNOSIS — F331 Major depressive disorder, recurrent, moderate: Secondary | ICD-10-CM

## 2020-08-11 MED ORDER — PREDNISONE 10 MG PO TABS: ORAL_TABLET | ORAL | 0 refills | Status: DC

## 2020-08-11 MED ORDER — DENOSUMAB 60 MG/ML ~~LOC~~ SOSY
60.0000 mg | PREFILLED_SYRINGE | Freq: Once | SUBCUTANEOUS | Status: AC
  Administered 2020-08-11: 60 mg via SUBCUTANEOUS

## 2020-08-11 NOTE — Patient Instructions (Addendum)
[employer] sells generic zyrtec as cetirizine   10 mg daily . Start taking it once daily in the morning  Continue nyquil at night   Prednisone taper for one week   Your ENT doc will deal with the fluid in the maxillary sinuses during your surgery

## 2020-08-11 NOTE — Assessment & Plan Note (Signed)
With confirmative T scores and history of  wrist fracture in 2014 secondary to fall. Secondary to surgical menopause at age 75 followed by only 3 years of oral hormone therapy. Patient has deferred use of alendronate which I agree may be problematic given her history of Guillain-Barr affecting the bulbar muscles.   SHE is tolerating Prolia and received her dose today.  T scores have improved since 2015 based on 2020 DEXA
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
## 2020-08-11 NOTE — Chronic Care Management (AMB) (Unsigned)
  Chronic Care Management   Outreach Note  08/11/2020 Name: Holly [name] MRN: [id number] DOB: 1945-08-11  Holly [name] is a 75 y.o. year old female who is a primary care patient of Derrel Nip, Aris Everts, MD. I reached out to Holly [name] by phone today in response to a referral sent by Holly [name]'s PCP, Holly Mc, MD    An unsuccessful telephone outreach was attempted today. The patient was referred to the case management team for assistance with care management and care coordination.   Follow Up Plan: A HIPAA compliant phone message was left for the patient providing contact information and requesting a return call.  The care management team will reach out to the patient again over the next 5 days.  If patient returns call to provider office, please advise to call [facility]  at [place], [address], [place], [place] [address] Direct Dial: [phone] [email or web address] Website: [email or web address]
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
## 2020-08-11 NOTE — Assessment & Plan Note (Signed)
With maxillary sinus fluid on the right noted  On dental panoramics done in January by her dentist.  Her ENT has not responded to the information sent to her by Dr Adele Barthel  (dentist  Letter in [facility] via portal)  Adding zyrtec in the am. Patient's drainage is clear and there is no report of facial pain or fever   Prednisone taper x 6 days.

## 2020-08-19 NOTE — Chronic Care Management (AMB) (Signed)
  Chronic Care Management   Outreach Note  08/19/2020 Name: [name] MRN: [id number] DOB: January 04, 1946  Holly [name] is a 75 y.o. year old female who is a primary care patient of Derrel Nip, Aris Everts, MD. I reached out to Cephus Shelling by phone today in response to a referral sent by Ms. Alberteen Sam Charles's PCP, Crecencio Mc, MD     A second unsuccessful telephone outreach was attempted today. The patient was referred to the case management team for assistance with care management and care coordination.   Follow Up Plan: The care management team will reach out to the patient again after 3pm.  If patient returns call to provider office, please advise to call [facility]  at [place], [address], [place], [place] [address] Direct Dial: [phone] [email or web address]: [email or web address]

## 2020-08-22 NOTE — Chronic Care Management (AMB) (Signed)
  Chronic Care Management   Note  08/22/2020 Name: Holly [name] MRN: [id number] DOB: Oct 16, 1945  [name] is a 75 y.o. year old female who is a primary care patient of Derrel Nip, Aris Everts, MD. I reached out to Cephus Shelling by phone today in response to a referral sent by Ms. Alberteen Sam Flinders's PCP, Crecencio Mc, MD     Ms. Bureau was given information about Chronic Care Management services today including:  1. CCM service includes personalized support from designated clinical staff supervised by her physician, including individualized plan of care and coordination with other care providers 2. 24/7 contact phone numbers for assistance for urgent and routine care needs. 3. Service will only be billed when office clinical staff spend 20 minutes or more in a month to coordinate care. 4. Only one practitioner may furnish and bill the service in a calendar month. 5. The patient may stop CCM services at any time (effective at the end of the month) by phone call to the office staff. 6. The patient will be responsible for cost sharing (co-pay) of up to 20% of the service fee (after annual deductible is met).  Patient agreed to services and verbal consent obtained.   Follow up plan: Telephone appointment with care management team member scheduled for: LCSW 09/03/2020, Pharm D 09/10/2020  Noreene Larsson, [address], [place], [place] [address] Direct Dial: [phone] [email or web address] [email or web address]

## 2020-08-29 ENCOUNTER — Other Ambulatory Visit: Payer: Self-pay

## 2020-08-29 ENCOUNTER — Encounter: Payer: Self-pay | Admitting: Internal Medicine

## 2020-08-29 ENCOUNTER — Ambulatory Visit: Payer: Medicare PPO | Admitting: Internal Medicine

## 2020-08-29 VITALS — BP 122/74 | HR 94 | Temp 97.7°F | Ht 66.0 in | Wt 168.2 lb

## 2020-08-29 DIAGNOSIS — R590 Localized enlarged lymph nodes: Secondary | ICD-10-CM | POA: Diagnosis not present

## 2020-08-29 NOTE — Progress Notes (Signed)
Chief Complaint  Patient presents with  . Neck Pain    And swelling on the left side. On going for 3-4 days    Acute visit  Swollen glands left lower jawline painful x 3-4 days no dental issues 4/10 pain nothing tried she had dental appt 08/27/20 and 3/15 left side of neck hurting. Denies ab pain, fever, night sweats    Review of Systems  Constitutional: Negative for fever and weight loss.       Denies night sweats   Respiratory: Negative for shortness of breath.   Cardiovascular: Negative for chest pain.  Gastrointestinal: Negative for abdominal pain.   Past Medical History:  Diagnosis Date  . Anxiety   . Concussion with loss of consciousness 04/10/2017  . Depression   . Guillain Barr syndrome ([place]) 2005  . Osteoporosis   . Radial fracture 01/23/2013   Right arm just proximal to wrist. Secondary to fall. Patient has a history of osteoporosis and requires vitamin D and repeat DEXA scan if possible.   . Squamous cell skin cancer 2011   Past Surgical History:  Procedure Laterality Date  . ABDOMINAL HYSTERECTOMY  1989  . APPENDECTOMY  1960  . BREAST EXCISIONAL BIOPSY  1980   benign  . BREAST SURGERY  1980   biopsy  . WRIST FRACTURE SURGERY Right 2015   Family History  Problem Relation Age of Onset  . Cancer Mother   . Mental illness Sister   . Bipolar disorder Sister   . Depression Brother   . Mental illness Brother   . Parkinson's disease Brother   . Dystonia Brother   . Anxiety disorder Daughter    Social History   Socioeconomic History  . Marital status: Widowed    Spouse name: Not on file  . Number of children: Not on file  . Years of education: Not on file  . Highest education level: Not on file  Occupational History  . Not on file  Tobacco Use  . Smoking status: Former Smoker    Packs/day: 0.20    Types: Cigarettes    Quit date: 06/14/1977    Years since quitting: 43.2  . Smokeless tobacco: Never Used  Vaping Use  . Vaping Use: Never used  Substance  and Sexual Activity  . Alcohol use: Never    Alcohol/week: 0.0 standard drinks    Comment: social (about once a month)  . Drug use: No  . Sexual activity: Never  Other Topics Concern  . Not on file  Social History Narrative  . Not on file   Social Determinants of Health   Financial Resource Strain: Low Risk   . Difficulty of Paying Living Expenses: Not hard at all  Food Insecurity: No Food Insecurity  . Worried About Charity fundraiser in the Last Year: Never true  . Ran Out of Food in the Last Year: Never true  Transportation Needs: No Transportation Needs  . Lack of Transportation (Medical): No  . Lack of Transportation (Non-Medical): No  Physical Activity: Unknown  . Days of Exercise per Week: 0 days  . Minutes of Exercise per Session: Not on file  Stress: No Stress Concern Present  . Feeling of Stress : Only a little  Social Connections: Socially Isolated  . Frequency of Communication with Friends and Family: More than three times a week  . Frequency of Social Gatherings with Friends and Family: Never  . Attends Religious Services: Never  . Active Member of Clubs or Organizations: No  .  Attends Archivist Meetings: Never  . Marital Status: Widowed  Intimate Partner Violence: Not At Risk  . Fear of Current or Ex-Partner: No  . Emotionally Abused: No  . Physically Abused: No  . Sexually Abused: No   Current Meds  Medication Sig  . ARIPiprazole (ABILIFY) 5 MG tablet TAKE 1 TABLET BY MOUTH EVERY DAY IN THE MORNING  . Cholecalciferol (VITAMIN D) 50 MCG (2000 UT) CAPS Take by mouth.  . clorazepate (TRANXENE) 7.5 MG tablet Take 7.5 mg by mouth 3 (three) times daily.  Marland Kitchen desipramine (NORPRAMIN) 25 MG tablet Take 25 mg by mouth daily.  . traZODone (DESYREL) 50 MG tablet Take 50 mg by mouth at bedtime.    Allergies  Allergen Reactions  . Haemophilus Influenzae Vaccines Other (See Comments)    Guillain Barre   . Oxycodone-Acetaminophen Nausea And Vomiting  .  Cephalexin     Other reaction(s): HIVES   Recent Results (from the past 2160 hour(s))  CBC with Differential/Platelet     Status: Abnormal   Collection Time: 06/11/20 12:18 PM  Result Value Ref Range   WBC 4.6 4.0 - 10.5 K/uL   RBC 3.92 3.87 - 5.11 Mil/uL   Hemoglobin 11.9 (L) 12.0 - 15.0 g/dL   HCT 36.1 36.0 - 46.0 %   MCV 92.2 78.0 - 100.0 fl   MCHC 33.0 30.0 - 36.0 g/dL   RDW 13.5 11.5 - 15.5 %   Platelets 270.0 150.0 - 400.0 K/uL   Neutrophils Relative % 56.9 43.0 - 77.0 %   Lymphocytes Relative 33.2 12.0 - 46.0 %   Monocytes Relative 7.7 3.0 - 12.0 %   Eosinophils Relative 1.2 0.0 - 5.0 %   Basophils Relative 1.0 0.0 - 3.0 %   Neutro Abs 2.6 1.4 - 7.7 K/uL   Lymphs Abs 1.5 0.7 - 4.0 K/uL   Monocytes Absolute 0.4 0.1 - 1.0 K/uL   Eosinophils Absolute 0.1 0.0 - 0.7 K/uL   Basophils Absolute 0.0 0.0 - 0.1 K/uL  Comprehensive metabolic panel     Status: Abnormal   Collection Time: 06/11/20 12:18 PM  Result Value Ref Range   Sodium 140 135 - 145 mEq/L   Potassium 4.1 3.5 - 5.1 mEq/L   Chloride 103 96 - 112 mEq/L   CO2 30 19 - 32 mEq/L   Glucose, Bld 84 70 - 99 mg/dL   BUN 19 6 - 23 mg/dL   Creatinine, Ser 0.98 0.40 - 1.20 mg/dL   Total Bilirubin 0.4 0.2 - 1.2 mg/dL   Alkaline Phosphatase 47 39 - 117 U/L   AST 12 0 - 37 U/L   ALT 9 0 - 35 U/L   Total Protein 7.0 6.0 - 8.3 g/dL   Albumin 4.6 3.5 - 5.2 g/dL   GFR 57.72 (L) >60.00 mL/min    Comment: Calculated using the CKD-EPI Creatinine Equation (2021)   Calcium 9.7 8.4 - 10.5 mg/dL  Lipid panel     Status: Abnormal   Collection Time: 06/11/20 12:18 PM  Result Value Ref Range   Cholesterol 223 (H) 0 - 200 mg/dL    Comment: ATP III Classification       Desirable:  < 200 mg/dL               Borderline High:  200 - 239 mg/dL          High:  > = 240 mg/dL   Triglycerides 101.0 0.0 - 149.0 mg/dL    Comment: Normal:  <  150 mg/dLBorderline High:  150 - 199 mg/dL   HDL 87.50 >39.00 mg/dL   VLDL 20.2 0.0 - 40.0 mg/dL   LDL  Cholesterol 115 (H) 0 - 99 mg/dL   Total CHOL/HDL Ratio 3     Comment:                Men          Women1/2 Average Risk     3.4          3.3Average Risk          5.0          4.42X Average Risk          9.6          7.13X Average Risk          15.0          11.0                       NonHDL 135.55     Comment: NOTE:  Non-HDL goal should be 30 mg/dL higher than patient's LDL goal (i.e. LDL goal of < 70 mg/dL, would have non-HDL goal of < 100 mg/dL)   Objective  Body mass index is 27.15 kg/m. Wt Readings from Last 3 Encounters:  08/29/20 168 lb 3.2 oz (76.3 kg)  08/11/20 168 lb (76.2 kg)  06/11/20 175 lb (79.4 kg)   Temp Readings from Last 3 Encounters:  08/29/20 97.7 F (36.5 C) (Oral)  08/11/20 98.1 F (36.7 C) (Oral)  06/11/20 97.6 F (36.4 C) (Oral)   BP Readings from Last 3 Encounters:  08/29/20 122/74  08/11/20 124/70  06/11/20 130/68   Pulse Readings from Last 3 Encounters:  08/29/20 94  08/11/20 90  06/11/20 86    Physical Exam Vitals and nursing note reviewed.  Constitutional:      Appearance: Normal appearance. She is well-developed and well-groomed.  HENT:     Head: Normocephalic and atraumatic.  Neck:   Cardiovascular:     Rate and Rhythm: Normal rate and regular rhythm.     Heart sounds: Normal heart sounds. No murmur heard.   Pulmonary:     Effort: Pulmonary effort is normal.     Breath sounds: Normal breath sounds.  Chest:  Breasts:     Right: No supraclavicular adenopathy.     Left: No supraclavicular adenopathy.    Abdominal:     Tenderness: There is no abdominal tenderness.  Lymphadenopathy:     Head:     Left side of head: Submandibular adenopathy present.     Cervical: Cervical adenopathy present.     Left cervical: Superficial cervical adenopathy present.     Upper Body:     Right upper body: No supraclavicular adenopathy.     Left upper body: No supraclavicular adenopathy.  Skin:    General: Skin is warm and dry.  Neurological:      General: No focal deficit present.     Mental Status: She is alert and oriented to person, place, and time. Mental status is at baseline.     Gait: Gait normal.  Psychiatric:        Attention and Perception: Attention and perception normal.        Mood and Affect: Mood and affect normal.        Speech: Speech normal.        Behavior: Behavior normal. Behavior is cooperative.  Thought Content: Thought content normal.        Cognition and Memory: Cognition and memory normal.        Judgment: Judgment normal.     Assessment  Plan  Cervical lymphadenopathy - Plan: US SOFT TISSUE HEAD & NECK (NON-THYROID)  F/u with PCP   Provider: Dr. Olivia Mackie [facility]

## 2020-08-29 NOTE — Patient Instructions (Addendum)
Debrox ear wax drops  Lymphadenopathy  Lymphadenopathy means that your lymph glands are swollen or larger than normal. Lymph glands, also called lymph nodes, are collections of tissue that filter excess fluid, bacteria, viruses, and waste from your bloodstream. They are part of your body's disease-fighting system (immune system), which protects your body from germs. There may be different causes of lymphadenopathy, depending on where it is in your body. Some types go away on their own. Lymphadenopathy can occur anywhere that you have lymph glands, including these areas:  Neck (cervical lymphadenopathy).  Chest (mediastinal lymphadenopathy).  Lungs (hilar lymphadenopathy).  Underarms (axillary lymphadenopathy).  Groin (inguinal lymphadenopathy). When your immune system responds to germs, infection-fighting cells and fluid build up in your lymph glands. This causes some swelling and enlargement. If the lymph nodes do not go back to normal size after you have an infection or disease, your health care provider may do tests. These tests help to monitor your condition and find the reason why the glands are still swollen and enlarged. Follow these instructions at home:  Get plenty of rest.  Your health care provider may recommend over-the-counter medicines for pain. Take over-the-counter and prescription medicines only as told by your health care provider.  If directed, apply heat to swollen lymph glands as often as told by your health care provider. Use the heat source that your health care provider recommends, such as a moist heat pack or a heating pad. ? Place a towel between your skin and the heat source. ? Leave the heat on for 20-30 minutes. ? Remove the heat if your skin turns bright red. This is especially important if you are unable to feel pain, heat, or cold. You may have a greater risk of getting burned.  Check your affected lymph glands every day for changes. Check other lymph gland  areas as told by your health care provider. Check for changes such as: ? More swelling. ? Sudden increase in size. ? Redness or pain. ? Hardness.  Keep all follow-up visits. This is important.   Contact a health care provider if you have:  Lymph glands that: ? Are still swollen after 2 weeks. ? Have suddenly gotten bigger or the swelling spreads. ? Are red, painful, or hard.  Fluid leaking from the skin near an enlarged lymph gland.  Problems with breathing.  A fever, chills, or night sweats.  Fatigue.  A sore throat.  Pain in your abdomen.  Weight loss. Get help right away if you have:  Severe pain.  Chest pain.  Shortness of breath. These symptoms may represent a serious problem that is an emergency. Do not wait to see if the symptoms will go away. Get medical help right away. Call your local emergency services (911 in the [place] Do not drive yourself to the hospital. Summary  Lymphadenopathy means that your lymph glands are swollen or larger than normal.  Lymph glands, also called lymph nodes, are collections of tissue that filter excess fluid, bacteria, viruses, and waste from the bloodstream. They are part of your body's disease-fighting system (immune system).  Lymphadenopathy can occur anywhere that you have lymph glands.  If the lymph nodes do not go back to normal size after you have an infection or disease, your health care provider may do tests to monitor your condition and find the reason why the glands are still swollen and enlarged.  Check your affected lymph glands every day for changes. Check other lymph gland areas as told by your  health care provider. This information is not intended to replace advice given to you by your health care provider. Make sure you discuss any questions you have with your health care provider. Document Revised: 03/26/2020 Document Reviewed: 03/26/2020 Elsevier Patient Education  2021 [employer].

## 2020-09-01 ENCOUNTER — Telehealth: Payer: Self-pay | Admitting: Internal Medicine

## 2020-09-01 DIAGNOSIS — F502 Bulimia nervosa: Secondary | ICD-10-CM | POA: Diagnosis not present

## 2020-09-01 DIAGNOSIS — F332 Major depressive disorder, recurrent severe without psychotic features: Secondary | ICD-10-CM | POA: Diagnosis not present

## 2020-09-01 DIAGNOSIS — F419 Anxiety disorder, unspecified: Secondary | ICD-10-CM | POA: Diagnosis not present

## 2020-09-01 NOTE — Telephone Encounter (Signed)
Pt called returning your call 

## 2020-09-01 NOTE — Telephone Encounter (Signed)
lft vm for pt to call ofc to sch [facility] thanks 

## 2020-09-02 NOTE — Telephone Encounter (Signed)
Thank you :)

## 2020-09-03 ENCOUNTER — Ambulatory Visit (INDEPENDENT_AMBULATORY_CARE_PROVIDER_SITE_OTHER): Payer: Medicare PPO | Admitting: *Deleted

## 2020-09-03 DIAGNOSIS — F502 Bulimia nervosa: Secondary | ICD-10-CM

## 2020-09-03 DIAGNOSIS — F331 Major depressive disorder, recurrent, moderate: Secondary | ICD-10-CM

## 2020-09-03 DIAGNOSIS — G2111 Neuroleptic induced parkinsonism: Secondary | ICD-10-CM

## 2020-09-03 DIAGNOSIS — F5025 Bulimia nervosa, in remission: Secondary | ICD-10-CM
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
## 2020-09-04 ENCOUNTER — Encounter: Payer: Self-pay | Admitting: *Deleted

## 2020-09-09 ENCOUNTER — Ambulatory Visit: Payer: Medicare PPO | Admitting: *Deleted

## 2020-09-09 DIAGNOSIS — F502 Bulimia nervosa: Secondary | ICD-10-CM

## 2020-09-09 DIAGNOSIS — F331 Major depressive disorder, recurrent, moderate: Secondary | ICD-10-CM

## 2020-09-09 DIAGNOSIS — G2111 Neuroleptic induced parkinsonism: Secondary | ICD-10-CM

## 2020-09-09 DIAGNOSIS — F419 Anxiety disorder, unspecified: Secondary | ICD-10-CM
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
## 2020-09-10 ENCOUNTER — Ambulatory Visit: Payer: Medicare PPO | Admitting: Pharmacist

## 2020-09-10 DIAGNOSIS — M810 Age-related osteoporosis without current pathological fracture: Secondary | ICD-10-CM | POA: Diagnosis not present

## 2020-09-10 DIAGNOSIS — Z8669 Personal history of other diseases of the nervous system and sense organs: Secondary | ICD-10-CM

## 2020-09-10 DIAGNOSIS — E559 Vitamin D deficiency, unspecified: Secondary | ICD-10-CM

## 2020-09-10 DIAGNOSIS — F331 Major depressive disorder, recurrent, moderate: Secondary | ICD-10-CM

## 2020-09-10 DIAGNOSIS — G2111 Neuroleptic induced parkinsonism: Secondary | ICD-10-CM

## 2020-09-10 NOTE — Chronic Care Management (AMB) (Signed)
Chronic Care Management Pharmacy Note  09/10/2020 Name:  Holly [name] MRN:  [id number] DOB:  09-23-1945  Subjective: Holly [name] is an 75 y.o. year old female who is a primary patient of Derrel Nip, Aris Everts, MD.  The CCM team was consulted for assistance with disease management and care coordination needs.    Engaged with patient by telephone for initial visit in response to provider referral for pharmacy case management and/or care coordination services.   Consent to Services:  The patient was given the following information about Chronic Care Management services today, agreed to services, and gave verbal consent: 1. CCM service includes personalized support from designated clinical staff supervised by the primary care provider, including individualized plan of care and coordination with other care providers 2. 24/7 contact phone numbers for assistance for urgent and routine care needs. 3. Service will only be billed when office clinical staff spend 20 minutes or more in a month to coordinate care. 4. Only one practitioner may furnish and bill the service in a calendar month. 5.The patient may stop CCM services at any time (effective at the end of the month) by phone call to the office staff. 6. The patient will be responsible for cost sharing (co-pay) of up to 20% of the service fee (after annual deductible is met). Patient agreed to services and consent obtained.  Patient Care Team: Crecencio Mc, MD as PCP - General (Internal Medicine) De Hollingshead, RPH-CPP (Pharmacist) Carson Myrtle, Maree Erie, LCSW as Social Worker (Licensed Clinical Social Worker)  Recent office visits:  3/23, 3/29 - LCSW call  3/18 - Dr. Terese Door for cervical lymphadenopathy, [place] scheduled but patient notes today resolution of swelling  2/28 - PCP, f/u depression, osteoporosis  Recent consult visits:  3/24 - preop with [facility] Dr. Cherlyn Cushing prior to septorhinoplasty, scheduled 4/8  Hospital visits: None  in previous 6 months  Objective:  Lab Results  Component Value Date   CREATININE 0.98 06/11/2020   CREATININE 0.79 11/27/2019   CREATININE 0.93 03/27/2019       Component Value Date/Time   CHOL 223 (H) 06/11/2020 1218   TRIG 101.0 06/11/2020 1218   HDL 87.50 06/11/2020 1218   CHOLHDL 3 06/11/2020 1218   VLDL 20.2 06/11/2020 1218   LDLCALC 115 (H) 06/11/2020 1218   LDLDIRECT 129.0 06/30/2015 0944    Hepatic Function Latest Ref Rng & Units 06/11/2020 11/27/2019 03/27/2019  Total Protein 6.0 - 8.3 g/dL 7.0 7.1 7.4  Albumin 3.5 - 5.2 g/dL 4.6 4.4 4.7  AST 0 - 37 U/L _0 ALT 0 - 35 U/L _1 Alk Phosphatase 39 - 117 U/L 47 41 43  Total Bilirubin 0.2 - 1.2 mg/dL 0.4 0.4 0.5    Lab Results  Component Value Date/Time   TSH 1.53 03/27/2019 03:16 PM   TSH 1.87 10/14/2017 09:41 AM    CBC Latest Ref Rng & Units 06/11/2020 11/27/2019 03/27/2019  WBC 4.0 - 10.5 K/uL 4.6 5.8 5.4  Hemoglobin 12.0 - 15.0 g/dL 11.9(L) 11.5(L) 12.3  Hematocrit 36.0 - 46.0 % 36.1 33.9(L) 37.0  Platelets 150.0 - 400.0 K/uL 270.0 227.0 267.0    Lab Results  Component Value Date/Time   VD25OH 32.60 11/27/2019 01:42 PM   VD25OH 22.78 (L) 03/27/2019 03:16 PM    Clinical ASCVD: No  The 10-year ASCVD risk score Mikey Bussing DC Jr., et al., 2013) is: 13.6%   Values used to calculate the score:     Age:  69 years     Sex: Female     Is Non-Hispanic African American: No     Diabetic: No     Tobacco smoker: No     Systolic Blood Pressure: 202 mmHg     Is BP treated: No     HDL Cholesterol: 87.5 mg/dL     Total Cholesterol: 223 mg/dL     Social History   Tobacco Use  Smoking Status Former Smoker  . Packs/day: 0.20  . Types: Cigarettes  . Quit date: 06/14/1977  . Years since quitting: 43.2  Smokeless Tobacco Never Used   BP Readings from Last 3 Encounters:  08/29/20 122/74  08/11/20 124/70  06/11/20 130/68   Pulse Readings from Last 3 Encounters:  08/29/20 94  08/11/20 90  06/11/20 86    Wt Readings from Last 3 Encounters:  08/29/20 168 lb 3.2 oz (76.3 kg)  08/11/20 168 lb (76.2 kg)  06/11/20 175 lb (79.4 kg)    Assessment: Review of patient past medical history, allergies, medications, health status, including review of consultants reports, laboratory and other test data, was performed as part of comprehensive evaluation and provision of chronic care management services.   SDOH:  (Social Determinants of Health) assessments and interventions performed:  SDOH Interventions   Flowsheet Row Most Recent Value  SDOH Interventions   Financial Strain Interventions Intervention Not Indicated  Stress Interventions Other (Comment)  [encouraged continued follow up with LCSW, psychiatry]      CCM Care Plan  Allergies  Allergen Reactions  . Haemophilus Influenzae Vaccines Other (See Comments)    Guillain Barre   . Oxycodone-Acetaminophen Nausea And Vomiting  . Cephalexin     Other reaction(s): HIVES    Medications Reviewed Today    Reviewed by De Hollingshead, RPH-CPP (Pharmacist) on 09/10/20 at [place] List Status: <None>  Medication Order Taking? Sig Documenting Provider Last Dose Status Informant  ARIPiprazole (ABILIFY) 5 MG tablet [id number] Yes Take 5 mg by mouth daily. [provider] Taking Active   Cholecalciferol (VITAMIN D) 50 MCG (2000 UT) CAPS [id number] Yes Take 1 capsule by mouth daily. [provider] Taking Active   clorazepate (TRANXENE) 7.5 MG tablet [id number] Yes Take 7.5 mg by mouth 3 (three) times daily. [provider] Taking Active            Med Note Darnelle Maffucci, Arville Lime   Wed Sep 10, 2020  9:11 AM) PRN, generally QPM  desipramine (NORPRAMIN) 25 MG tablet [id number] Yes Take 50 mg by mouth every morning. [provider] Taking Active   traZODone (DESYREL) 100 MG tablet [id number] Yes Take 50 mg by mouth at bedtime.  [provider] Taking Active           Patient Active Problem List   Diagnosis  Date Noted  . Restless legs 11/27/2019  . Autonomous neurogenic bladder 06/24/2019  . 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  . Incomplete bladder emptying 01/11/2019  . 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 Eustachian tube disorder, right 11/22/2018  . 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 Encounter for preventive health examination 07/01/2015  . Colon cancer screening 03/16/2013  . Routine general medical examination at a health care facility 03/16/2013  . Right leg weakness 01/23/2013  . History of Guillain-Barre syndrome 01/23/2013  . 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 UNSPECIFIED VITAMIN D DEFICIENCY 08/26/2008  . Recurrent depressive disorder, current episode moderate ([place]) 07/29/2008    Immunization History  Administered Date(s) Administered  . Td 06/14/2006  Conditions to be addressed/monitored: Anxiety, Depression and Osteopenia  Care Plan : Medication Management  Updates made by De Hollingshead, RPH-CPP since 09/10/2020 12:00 AM    Problem: Depression, Anxiety, Osteopenia     Long-Range Goal: Disease Progression Prevention   Start Date: 09/10/2020  This Visit's Progress: On track  Priority: High  Note:   Current Barriers:  . Complex patient with comorbidities at high risk for exacerbation  Pharmacist Clinical Goal(s):  Marland Kitchen Over the next 90 days, patient will achieve adherence to monitoring guidelines and medication adherence to achieve therapeutic efficacy through collaboration with PharmD and provider.    Interventions: . 1:1 collaboration with Crecencio Mc, MD regarding development and update of comprehensive plan of care as evidenced by provider attestation and co-signature . Inter-disciplinary care team collaboration (see longitudinal plan of care) . Comprehensive medication review performed; medication list updated in electronic medical record  SDOH: . Reports enjoying engaging with LCSW for mental health support  Health  Maintenance: . 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 Worries about receiving COVID vaccine.  . Discussed that GBS has been seen with J&J vaccine but not with either mRNA vaccine. Patient worries that she wouldn't be able to care for herself if she did contract COVID. Recommend Moderna or Coca[employer]Cola vaccine. Provided with [place] vaccine scheduling phone number. Patient plans to contemplate and likely schedule after her upcoming septal surgery.  . Reports resolution of lymph swelling that she discussed w/ Dr. Olivia Mackie at last visit. Requests cancellation of ultra sound. Notes that her surgeon checked the lymphs and noted no swelling remains. Collaborated w/ referral coordinator to have this cancelled.   Depression/Anxiety: . Moderately well managed at this time; current treatment: desipramine 50 mg QAM, aripiprazole 5 mg QAM, clorazepate 7.5 mg TID PRN - usually just needing at night to help sleep; trazodone 100 mg QPM - generally not taking, doesn't feel that it is as effective as clorazepate; follows w/ psychiatry Dr. Casimiro Needle . Encouraged continued collaborate w/ psychiatry, therapy . Discussed risk of sedation with multiple CNS sedating medications. Patient notes that she avoids taking clorazepate if she wakes up in the early morning hours (ie 3-4 am) because she knows it will sedate her the next morning . Patient notes her biggest concern is her stability and safety getting around the house. Continue to monitor for oversedation, falls risk with this regimen. Monitor for serotonin syndrome symptoms with any addition of serotonergic medications  Osteopenia: . Current treatment: Prolia Q6 months . Last DEXA: 04/2019 - t score -2.1 at femoral neck . Supplementation: Vitamin D 2000 units daily; reports her daily intake includes at least 1 serving of yogurt, sometimes cheese or oat milk . Discussed goal calcium 1200 mg daily + vitamin D at least 800 units daily. Discussed that given daily dairy  serving (~300 mg Ca) + miscellaneous sources (~250 mg), recommend choosing an OTC calcium supplement ~400-600 mg calcium daily. Separate administration from other dairy sources. Continue vitamin D daily.    Patient Goals/Self-Care Activities . Over the next 90 days, patient will:  - take medications as prescribed - contact the Care Team with any questions or concerns  Follow Up Plan: Telephone follow up appointment with care management team member scheduled for: ~ 6 weeks       Medication Assistance: None required.  Patient affirms current coverage meets needs.  Patient's preferred pharmacy is:  [id number] [id number]:  [phone] Fax: [phone]  Uses pill box? Yes Pt endorses 100% compliance  Follow Up:  Patient agrees to Care Plan and Follow-up.  Plan: Telephone follow up appointment with care management team member scheduled for:  ~ 6 weeks  Catie Darnelle Maffucci, PharmD, [place], [place] Clinical Pharmacist [employer] at [employer] [phone]

## 2020-09-10 NOTE — Patient Instructions (Signed)
Holly [name],   It was great talking with you today!  We have canceled the ultra sound. Let us know if there are any concerns in the future.   Pick up a calcium supplement that is 400-600 mg daily. Take this at a separate time from your dietary dairy sources.   Call the [facility] scheduling line at [phone]. Call me if any questions or concerns!  Catie Darnelle Maffucci, PharmD [phone]    Visit Information  PATIENT GOALS:  Goals Addressed              This Visit's Progress     Patient Stated   .  Medication Management (pt-stated)        Patient Goals/Self-Care Activities . Over the next 90 days, patient will:  - take medications as prescribed - contact the Care Team with any questions or concerns       Consent to CCM Services: Ms. Goeser was given information about Chronic Care Management services today including:  1. CCM service includes personalized support from designated clinical staff supervised by her physician, including individualized plan of care and coordination with other care providers 2. 24/7 contact phone numbers for assistance for urgent and routine care needs. 3. Service will only be billed when office clinical staff spend 20 minutes or more in a month to coordinate care. 4. Only one practitioner may furnish and bill the service in a calendar month. 5. The patient may stop CCM services at any time (effective at the end of the month) by phone call to the office staff. 6. The patient will be responsible for cost sharing (co-pay) of up to 20% of the service fee (after annual deductible is met).  Patient agreed to services and verbal consent obtained.   The patient verbalized understanding of instructions, educational materials, and care plan provided today and agreed to receive a mailed copy of patient instructions, educational materials, and care plan.   Plan: Telephone follow up appointment with care management team member scheduled for:  ~ 6  weeks  Catie Darnelle Maffucci, PharmD, [place], CPP Clinical Pharmacist [place] at [facility] [phone]  CLINICAL CARE PLAN:   Patient Care Plan: Medication Management    Problem Identified: Depression, Anxiety, Osteopenia     Long-Range Goal: Disease Progression Prevention   Start Date: 09/10/2020  This Visit's Progress: On track  Priority: High  Note:   Current Barriers:  . Complex patient with comorbidities at high risk for exacerbation  Pharmacist Clinical Goal(s):  Marland Kitchen Over the next 90 days, patient will achieve adherence to monitoring guidelines and medication adherence to achieve therapeutic efficacy through collaboration with PharmD and provider.    Interventions: . 1:1 collaboration with Crecencio Mc, MD regarding development and update of comprehensive plan of care as evidenced by provider attestation and co-signature . Inter-disciplinary care team collaboration (see longitudinal plan of care) . Comprehensive medication review performed; medication list updated in electronic medical record  SDOH: . Reports enjoying engaging with LCSW for mental health support  Health Maintenance: . 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 Worries about receiving COVID vaccine.  . Discussed that GBS has been seen with J&J vaccine but not with either mRNA vaccine. Patient worries that she wouldn't be able to care for herself if she did contract COVID. Recommend Moderna or Coca-Cola vaccine. Provided with [place] vaccine scheduling phone number. Patient plans to contemplate and likely schedule after her upcoming septal surgery.  . Reports resolution of lymph swelling that she discussed w/ Dr.  Tracy at last visit. Requests cancellation of ultra sound. Notes that her surgeon checked the lymphs and noted no swelling remains. Collaborated w/ referral coordinator to have this cancelled.   Depression/Anxiety: . Moderately well managed at this time; current treatment:  desipramine 50 mg QAM, aripiprazole 5 mg QAM, clorazepate 7.5 mg TID PRN - usually just needing at night to help sleep; trazodone 100 mg QPM - generally not taking, doesn't feel that it is as effective as clorazepate; follows w/ psychiatry Dr. Casimiro Needle . Encouraged continued collaborate w/ psychiatry, therapy . Discussed risk of sedation with multiple CNS sedating medications. Patient notes that she avoids taking clorazepate if she wakes up in the early morning hours (ie 3-4 am) because she knows it will sedate her the next morning . Patient notes her biggest concern is her stability and safety getting around the house. Continue to monitor for oversedation, falls risk with this regimen. Monitor for serotonin syndrome symptoms with any addition of serotonergic medications  Osteopenia: . Current treatment: Prolia Q6 months . Last DEXA: 04/2019 - t score -2.1 at femoral neck . Supplementation: Vitamin D 2000 units daily; reports her daily intake includes at least 1 serving of yogurt, sometimes cheese or oat milk . Discussed goal calcium 1200 mg daily + vitamin D at least 800 units daily. Discussed that given daily dairy serving (~300 mg Ca) + miscellaneous sources (~250 mg), recommend choosing an OTC calcium supplement ~400-600 mg calcium daily. Separate administration from other dairy sources. Continue vitamin D daily.    Patient Goals/Self-Care Activities . Over the next 90 days, patient will:  - take medications as prescribed - contact the Care Team with any questions or concerns  Follow Up Plan: Telephone follow up appointment with care management team member scheduled for: ~ 6 weeks

## 2020-09-12 DIAGNOSIS — M950 Acquired deformity of nose: Secondary | ICD-10-CM | POA: Diagnosis not present

## 2020-09-12 DIAGNOSIS — Z20822 Contact with and (suspected) exposure to covid-19: Secondary | ICD-10-CM | POA: Diagnosis not present

## 2020-09-12 DIAGNOSIS — J3489 Other specified disorders of nose and nasal sinuses: Secondary | ICD-10-CM | POA: Diagnosis not present

## 2020-09-12 DIAGNOSIS — Z01812 Encounter for preprocedural laboratory examination: Secondary | ICD-10-CM | POA: Diagnosis not present

## 2020-09-12 DIAGNOSIS — J342 Deviated nasal septum: Secondary | ICD-10-CM | POA: Diagnosis not present

## 2020-09-16 ENCOUNTER — Ambulatory Visit (INDEPENDENT_AMBULATORY_CARE_PROVIDER_SITE_OTHER): Payer: Medicare PPO | Admitting: *Deleted

## 2020-09-16 DIAGNOSIS — F331 Major depressive disorder, recurrent, moderate: Secondary | ICD-10-CM | POA: Diagnosis not present

## 2020-09-16 DIAGNOSIS — F502 Bulimia nervosa: Secondary | ICD-10-CM

## 2020-09-16 DIAGNOSIS — G2111 Neuroleptic induced parkinsonism: Secondary | ICD-10-CM | POA: Diagnosis not present

## 2020-09-16 DIAGNOSIS — F419 Anxiety disorder, unspecified: Secondary | ICD-10-CM
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
## 2020-09-18 ENCOUNTER — Ambulatory Visit: Payer: Medicare PPO

## 2020-09-19 DIAGNOSIS — J342 Deviated nasal septum: Secondary | ICD-10-CM | POA: Diagnosis not present

## 2020-09-19 DIAGNOSIS — M950 Acquired deformity of nose: Secondary | ICD-10-CM | POA: Diagnosis not present

## 2020-09-19 DIAGNOSIS — J300 Vasomotor rhinitis: Secondary | ICD-10-CM | POA: Diagnosis not present

## 2020-09-19 DIAGNOSIS — S022XXS Fracture of nasal bones, sequela: Secondary | ICD-10-CM | POA: Diagnosis not present

## 2020-09-19 DIAGNOSIS — J3489 Other specified disorders of nose and nasal sinuses: Secondary | ICD-10-CM | POA: Diagnosis not present

## 2020-09-19 DIAGNOSIS — Z87891 Personal history of nicotine dependence: Secondary | ICD-10-CM | POA: Diagnosis not present

## 2020-09-22 DIAGNOSIS — F419 Anxiety disorder, unspecified: Secondary | ICD-10-CM | POA: Diagnosis not present

## 2020-09-22 DIAGNOSIS — F502 Bulimia nervosa: Secondary | ICD-10-CM | POA: Diagnosis not present

## 2020-09-22 DIAGNOSIS — F330 Major depressive disorder, recurrent, mild: Secondary | ICD-10-CM | POA: Diagnosis not present

## 2020-09-30 ENCOUNTER — Telehealth: Payer: Medicare PPO

## 2020-10-01 ENCOUNTER — Ambulatory Visit: Payer: Medicare PPO | Admitting: *Deleted

## 2020-10-01 DIAGNOSIS — Z8669 Personal history of other diseases of the nervous system and sense organs: Secondary | ICD-10-CM

## 2020-10-01 DIAGNOSIS — F331 Major depressive disorder, recurrent, moderate: Secondary | ICD-10-CM

## 2020-10-01 DIAGNOSIS — G2111 Neuroleptic induced parkinsonism: Secondary | ICD-10-CM

## 2020-10-01 DIAGNOSIS — F419 Anxiety disorder, unspecified: Secondary | ICD-10-CM

## 2020-10-01 DIAGNOSIS — F502 Bulimia nervosa: Secondary | ICD-10-CM
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
## 2020-10-07 DIAGNOSIS — F419 Anxiety disorder, unspecified: Secondary | ICD-10-CM | POA: Diagnosis not present

## 2020-10-07 DIAGNOSIS — F332 Major depressive disorder, recurrent severe without psychotic features: Secondary | ICD-10-CM | POA: Diagnosis not present

## 2020-10-08 ENCOUNTER — Ambulatory Visit: Payer: Medicare PPO | Admitting: *Deleted

## 2020-10-08 DIAGNOSIS — Z8669 Personal history of other diseases of the nervous system and sense organs: Secondary | ICD-10-CM

## 2020-10-08 DIAGNOSIS — F502 Bulimia nervosa: Secondary | ICD-10-CM

## 2020-10-08 DIAGNOSIS — F419 Anxiety disorder, unspecified: Secondary | ICD-10-CM

## 2020-10-08 DIAGNOSIS — F331 Major depressive disorder, recurrent, moderate: Secondary | ICD-10-CM
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
## 2020-10-14 ENCOUNTER — Other Ambulatory Visit: Payer: Self-pay

## 2020-10-14 ENCOUNTER — Ambulatory Visit: Payer: Medicare PPO | Attending: Internal Medicine

## 2020-10-14 DIAGNOSIS — Z23 Encounter for immunization: Secondary | ICD-10-CM

## 2020-10-14 MED ORDER — PFIZER-BIONT COVID-19 VAC-TRIS 30 MCG/0.3ML IM SUSP
INTRAMUSCULAR | 0 refills | Status: DC
  Filled 2020-10-14: qty 0.3, 1d supply, fill #0

## 2020-10-14 NOTE — Progress Notes (Signed)
   Covid-19 Vaccination Clinic  Name:  Holly [name]    MRN: [id number] DOB: 1945-08-10  10/14/2020  [name] was observed post Covid-19 immunization for 15 minutes without incident. She was provided with Vaccine Information Sheet and instruction to access the V-Safe system.   [name] was instructed to call 911 with any severe reactions post vaccine: Marland Kitchen Difficulty breathing  . Swelling of face and throat  . A fast heartbeat  . A bad rash all over body  . Dizziness and weakness   Immunizations Administered    Name Date Dose VIS Date Route   PFIZER Comrnaty(Gray TOP) Covid-19 Vaccine 10/14/2020  8:55 AM 0.3 mL 05/22/2020 Intramuscular   Manufacturer: [employer]   Lot: [id number]   NDC: [address]-1      Lu Duffel, PharmD, MBA Clinical Pharmacist

## 2020-10-15 ENCOUNTER — Ambulatory Visit (INDEPENDENT_AMBULATORY_CARE_PROVIDER_SITE_OTHER): Payer: Medicare PPO | Admitting: *Deleted

## 2020-10-15 DIAGNOSIS — M810 Age-related osteoporosis without current pathological fracture: Secondary | ICD-10-CM

## 2020-10-15 DIAGNOSIS — F502 Bulimia nervosa: Secondary | ICD-10-CM

## 2020-10-15 DIAGNOSIS — F331 Major depressive disorder, recurrent, moderate: Secondary | ICD-10-CM | POA: Diagnosis not present

## 2020-10-15 DIAGNOSIS — F419 Anxiety disorder, unspecified: Secondary | ICD-10-CM

## 2020-10-15 DIAGNOSIS — G2111 Neuroleptic induced parkinsonism: Secondary | ICD-10-CM
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
## 2020-10-15 NOTE — Patient Instructions (Signed)
Visit Information  PATIENT GOALS: Goals Addressed              This Visit's Progress   .  Keep 50B Restraining Order in Place to [place] from Daughter. (pt-stated)   On track     Timeframe:  Short-Term Goal Priority:  High Start Date:    09/03/2020                         Expected End Date:   10/29/2020           Follow Up Date:  10/23/2020 at 2:30pm.  Patient Goals/Self-Care Activities: . LCSW assisted patient with contacting Officer Jeffie Pollock [phone]), Detective involved with patient and daughter, Aryiah Monterosso 33I Restraining Order, via a 3-way telephone conversation, to ask questions and address concerns.   . Patient is in the process of deciding whether or not she would like to drop the 50B Restraining Order against her daughter, Shiori Adcox, or addend the 50B Restraining Order, allowing her and Sunday Spillers to at least be able to communicate with one another by telephone.   . Consult with daughter about attending anger management classes and taking prescription psychotropic medications to manage symptoms of Bipolar Disorder, in an effort for her to prove her commitment for change. Marland Kitchen Keep bi-weekly appointments with [name], LCSW with [facility] [place], for counseling and supportive therapy services. Marland Kitchen Keep quarterly appointments with Dr. [name], Psychiatrist with [facility] [place], for medication management/administration and psychotherapeutic services.   Marland Kitchen Keep tri-weekly appointments with [name], [place] Specialist with [facility], at the [facility]. Marland Kitchen Keep weekly telephone sessions with LCSW for coaching and individual counseling services. . Be prepared to make a 3-way call with your daughter, Charlayne Vultaggio during the next scheduled telephone outreach with LCSW.  . Draft a list of questions/suggestions/concerns/etc. that you would like to address with your daughter,  Aerilyn Slee and be prepared to make a 3-way call via LCSW.    .  Reduce and Manage Symptoms of Depression, Anxiety and Bulimia. (pt-stated)   On track     Timeframe:  Short-Term Goal Priority:  High Start Date:      09/03/2020                       Expected End Date:   10/29/2020                 Follow Up Date:   10/23/2020 at 2:30pm.   Patient Goals/Self Care Activities: . Implement interventions discussed today to decreases symptoms of Anxiety, Depression and Bulimia and increase knowledge and/or ability of: coping skills, healthy habits, self-management skills, and stress reduction. . Continue to obtain adequate rest and maintain a healthy, well-balanced diet, exercise at least 2-3 times per week, or as tolerated and stay well-hydrated. . Continue to take psychotropic medications exactly as prescribed. Marland Kitchen Spend time and/or talk with friends and neighbors at least 2 to 3 times per week. . Continue to practice proper use of deep breathing exercises, relaxation techniques and mindfulness meditation strategies, daily.  Marland Kitchen Keep bi-weekly appointments with [name], LCSW with [facility] [place], for counseling and supportive therapy services. Marland Kitchen Keep quarterly appointments with Dr. [name], Psychiatrist with [facility] [place], for medication management/administration and psychotherapeutic services.  Marland Kitchen Keep weekly telephonic sessions with LCSW to receive coaching and individual counseling services.  Marland Kitchen  Keep tri-weekly appointments with [name], [place] Specialist with [facility], at the [facility].       Patient verbalizes understanding of instructions provided today and agrees to view in [place].   Telephone follow up appointment with care management team member scheduled for:  10/23/2020 at 2:30pm.  Nat Christen LCSW Licensed Clinical Social Worker [place]  [phone]

## 2020-10-16 DIAGNOSIS — F330 Major depressive disorder, recurrent, mild: Secondary | ICD-10-CM | POA: Diagnosis not present

## 2020-10-21 ENCOUNTER — Other Ambulatory Visit: Payer: Self-pay

## 2020-10-23 ENCOUNTER — Telehealth: Payer: Medicare PPO

## 2020-10-24 ENCOUNTER — Ambulatory Visit: Payer: Medicare PPO | Admitting: *Deleted

## 2020-10-24 DIAGNOSIS — Z8669 Personal history of other diseases of the nervous system and sense organs: Secondary | ICD-10-CM

## 2020-10-24 DIAGNOSIS — F502 Bulimia nervosa: Secondary | ICD-10-CM

## 2020-10-24 DIAGNOSIS — F419 Anxiety disorder, unspecified: Secondary | ICD-10-CM

## 2020-10-24 DIAGNOSIS — F5025 Bulimia nervosa, in remission: Secondary | ICD-10-CM

## 2020-10-24 DIAGNOSIS — F331 Major depressive disorder, recurrent, moderate: Secondary | ICD-10-CM
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
## 2020-10-24 NOTE — Patient Instructions (Signed)
Visit Information  PATIENT GOALS: Goals Addressed              This Visit's Progress   .  Keep 50B Restraining Order in Place to [place] from Daughter. (pt-stated)   On track     Timeframe:  Short-Term Goal Priority:  High Start Date:    09/03/2020                         Expected End Date:   11/07/2020           Follow Up Date:  11/07/2020 at 9:00am.  Patient Goals/Self-Care Activities: . LCSW assisted patient with contacting Officer Jeffie Pollock [phone]), Detective involved with patient and daughter, Lillieanna Tuohy 24O Restraining Order, via a 3-way telephone conversation, to ask questions and address concerns.   . Patient is in the process of deciding whether or not she would like to drop the 50B Restraining Order against her daughter, Lee Kalt, or addend the 50B Restraining Order, allowing her and Sunday Spillers to at least be able to communicate with one another by telephone.   . Consult with daughter about attending anger management classes and taking prescription psychotropic medications to manage symptoms of Bipolar Disorder, in an effort for her to prove her commitment for change. Marland Kitchen Keep bi-weekly appointments with [name], LCSW with [facility] [place], for counseling and supportive therapy services. Marland Kitchen Keep quarterly appointments with Dr. [name], Psychiatrist with [facility] [place], for medication management/administration and psychotherapeutic services.   Marland Kitchen Keep tri-weekly appointments with [name], [employer] Specialist with [facility], at the [facility]. Marland Kitchen Keep weekly telephone sessions with LCSW for coaching and individual counseling services.     .  Reduce and Manage Symptoms of Depression, Anxiety and Bulimia. (pt-stated)   On track     Timeframe:  Short-Term Goal Priority:  High Start Date:      09/03/2020                       Expected End Date:   11/07/2020                  Follow Up Date:   11/07/2020 at 9:00am.   Patient Goals/Self Care Activities: . Implement interventions discussed today to decreases symptoms of Anxiety, Depression and Bulimia and increase knowledge and/or ability of: coping skills, healthy habits, self-management skills, and stress reduction. . Continue to obtain adequate rest and maintain a healthy, well-balanced diet, exercise at least 2-3 times per week, or as tolerated and stay well-hydrated. . Continue to take psychotropic medications exactly as prescribed. Marland Kitchen Spend time and/or talk with friends and neighbors at least 2 to 3 times per week. . Continue to practice proper use of deep breathing exercises, relaxation techniques and mindfulness meditation strategies, daily.  Marland Kitchen Keep bi-weekly appointments with [name], LCSW with [facility] [place], for counseling and supportive therapy services. Marland Kitchen Keep quarterly appointments with Dr. [name], Psychiatrist with [facility] [place], for medication management/administration and psychotherapeutic services.  Marland Kitchen Keep bi-weekly telephonic sessions with LCSW to receive coaching and individual counseling services.  Marland Kitchen Keep tri-weekly appointments with [name], [place] Specialist with [facility], at the [facility] . Make and keep a list of all the positive things that you have been able to do, see and experience, since your daughter moved out of the  home.       Patient verbalizes understanding of instructions provided today and agrees to view in [place].   Telephone follow up appointment with care management team member scheduled for:  11/07/2020 at [place] LCSW Licensed Clinical Social Worker [place]  [phone]

## 2020-10-29 ENCOUNTER — Ambulatory Visit: Payer: Medicare PPO | Admitting: Pharmacist

## 2020-10-29 DIAGNOSIS — F331 Major depressive disorder, recurrent, moderate: Secondary | ICD-10-CM

## 2020-10-29 DIAGNOSIS — Z8669 Personal history of other diseases of the nervous system and sense organs: Secondary | ICD-10-CM

## 2020-10-29 DIAGNOSIS — F419 Anxiety disorder, unspecified: Secondary | ICD-10-CM

## 2020-10-29 NOTE — Patient Instructions (Signed)
Visit Information  PATIENT GOALS: Goals Addressed              This Visit's Progress     Patient Stated   .  COMPLETED: Medication Management (pt-stated)        Patient Goals/Self-Care Activities . Over the next 90 days, patient will:  - take medications as prescribed - contact the Care Team with any questions or concerns       Patient verbalizes understanding of instructions provided today and agrees to view in [place].  Plan: Pharmacy goals of care completed. Closing Pharmacy CCM case  Catie Darnelle Maffucci, PharmD, [place], CPP Contractor at [place]

## 2020-10-29 NOTE — Chronic Care Management (AMB) (Signed)
Chronic Care Management Pharmacy Note  10/29/2020 Name:  Holly [name] MRN:  [id number] DOB:  Aug 26, 1945  Subjective: Holly [name] is an 75 y.o. year old female who is a primary patient of Derrel Nip, Aris Everts, MD.  The CCM team was consulted for assistance with disease management and care coordination needs.    Engaged with patient by telephone for follow up visit in response to provider referral for pharmacy case management and/or care coordination services.    Consent to Services:  The patient was given information about Chronic Care Management services, agreed to services, and gave verbal consent prior to initiation of services.  Please see initial visit note for detailed documentation.   Patient Care Team: Crecencio Mc, MD as PCP - General (Internal Medicine) De Hollingshead, RPH-CPP (Pharmacist) [place]ito, Maree Erie, LCSW as Social Worker (Licensed Clinical Social Worker)  Recent office visits: Continues to follow w/ clinic LCSW  Recent consult visits: None since our last call  Hospital visits: None in previous 6 months  Objective:  Lab Results  Component Value Date   CREATININE 0.98 06/11/2020   CREATININE 0.79 11/27/2019   CREATININE 0.93 03/27/2019    No results found for: HGBA1C Last diabetic Eye exam: No results found for: HMDIABEYEEXA  Last diabetic Foot exam: No results found for: HMDIABFOOTEX      Component Value Date/Time   CHOL 223 (H) 06/11/2020 1218   TRIG 101.0 06/11/2020 1218   HDL 87.50 06/11/2020 1218   CHOLHDL 3 06/11/2020 1218   VLDL 20.2 06/11/2020 1218   [place] 115 (H) 06/11/2020 1218   LDLDIRECT 129.0 06/30/2015 0944    Hepatic Function Latest Ref Rng & Units 06/11/2020 11/27/2019 03/27/2019  Total Protein 6.0 - 8.3 g/dL 7.0 7.1 7.4  Albumin 3.5 - 5.2 g/dL 4.6 4.4 4.7  AST 0 - 37 U/L '12 19 13  ' ALT 0 - 35 U/L '9 26 11  ' Alk Phosphatase 39 - 117 U/L 47 41 43  Total Bilirubin 0.2 - 1.2 mg/dL 0.4 0.4 0.5    Lab Results   Component Value Date/Time   TSH 1.53 03/27/2019 03:16 PM   TSH 1.87 10/14/2017 09:41 AM    CBC Latest Ref Rng & Units 06/11/2020 11/27/2019 03/27/2019  WBC 4.0 - 10.5 K/uL 4.6 5.8 5.4  Hemoglobin 12.0 - 15.0 g/dL 11.9(L) 11.5(L) 12.3  Hematocrit 36.0 - 46.0 % 36.1 33.9(L) 37.0  Platelets 150.0 - 400.0 K/uL 270.0 227.0 267.0    Lab Results  Component Value Date/Time   VD25OH 32.60 11/27/2019 01:42 PM   VD25OH 22.78 (L) 03/27/2019 03:16 PM    Clinical ASCVD: No  The 10-year ASCVD risk score Mikey Bussing DC Jr., et al., 2013) is: 10.9%   Values used to calculate the score:     Age: 42 years     Sex: Female     Is Non-Hispanic African American: No     Diabetic: No     Tobacco smoker: No     Systolic Blood Pressure: 160 mmHg     Is BP treated: No     HDL Cholesterol: 87.5 mg/dL     Total Cholesterol: 223 mg/dL      Social History   Tobacco Use  Smoking Status Former Smoker  . Packs/day: 0.20  . Types: Cigarettes  . Quit date: 06/14/1977  . Years since quitting: 43.4  Smokeless Tobacco Never Used   BP Readings from Last 3 Encounters:  08/29/20 122/74  08/11/20 124/70  06/11/20 130/68  Pulse Readings from Last 3 Encounters:  08/29/20 94  08/11/20 90  06/11/20 86   Wt Readings from Last 3 Encounters:  08/29/20 168 lb 3.2 oz (76.3 kg)  08/11/20 168 lb (76.2 kg)  06/11/20 175 lb (79.4 kg)    Assessment: Review of patient past medical history, allergies, medications, health status, including review of consultants reports, laboratory and other test data, was performed as part of comprehensive evaluation and provision of chronic care management services.   SDOH:  (Social Determinants of Health) assessments and interventions performed:  SDOH Interventions   Flowsheet Row Most Recent Value  SDOH Interventions   Stress Interventions Other (Comment)  [provided empathetic listening]      CCM Care Plan  Allergies  Allergen Reactions  . Haemophilus Influenzae Vaccines  Other (See Comments)    Guillain Barre   . Oxycodone-Acetaminophen Nausea And Vomiting  . Cephalexin     Other reaction(s): HIVES    Medications Reviewed Today    Reviewed by De Hollingshead, RPH-CPP (Pharmacist) on 10/29/20 at [phone]  Med List Status: <None>  Medication Order Taking? Sig Documenting Provider Last Dose Status Informant  ARIPiprazole (ABILIFY) 5 MG tablet [id number] Yes Take 5 mg by mouth daily. [provider] Taking Active   Cholecalciferol (VITAMIN D) 50 MCG (2000 UT) CAPS [id number] Yes Take 1 capsule by mouth daily. [provider] Taking Active   clorazepate (TRANXENE) 7.5 MG tablet [id number] Yes Take 7.5 mg by mouth 3 (three) times daily. [provider] Taking Active            Med Note Darnelle Maffucci, Arville Lime   Wed Sep 10, 2020  9:11 AM) PRN, generally QPM  desipramine (NORPRAMIN) 25 MG tablet [id number] Yes Take 50 mg by mouth every morning. [provider] Taking Active   polyethylene glycol (MIRALAX / GLYCOLAX) 17 g packet [id number] Yes Take 17 g by mouth daily as needed. [provider] Taking Active   traZODone (DESYREL) 100 MG tablet [id number] Yes Take 50 mg by mouth at bedtime.  [provider] Taking Active           Patient Active Problem List   Diagnosis Date Noted  . Restless legs 11/27/2019  . Autonomous neurogenic bladder 06/24/2019  . 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  . Incomplete bladder emptying 01/11/2019  . 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 Eustachian tube disorder, right 11/22/2018  . 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 Encounter for preventive health examination 07/01/2015  . Colon cancer screening 03/16/2013  . Routine general medical examination at a health care facility 03/16/2013  . Right leg weakness 01/23/2013  . History of Guillain-Barre syndrome 01/23/2013  . 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 UNSPECIFIED VITAMIN D DEFICIENCY 08/26/2008  . Recurrent  depressive disorder, current episode moderate ([place]) 07/29/2008    Immunization History  Administered Date(s) Administered  . PFIZER Comirnaty(Gray Top)Covid-19 Tri-Sucrose Vaccine 10/14/2020  . Td 06/14/2006    Conditions to be addressed/monitored: Anxiety, Depression and Osteoporosis  Care Plan : PharmD - Medication Management  Updates made by De Hollingshead, RPH-CPP since 10/29/2020 12:00 AM  Completed 10/29/2020  Problem: Depression, Anxiety, Osteopenia Resolved 10/29/2020    Long-Range Goal: Disease Progression Prevention Completed 10/29/2020  Start Date: 09/10/2020  This Visit's Progress: On track  Recent Progress: On track  Priority: High  Note:   Current Barriers:  . Complex patient with comorbidities at high risk for exacerbation  Pharmacist Clinical Goal(s):  Marland Kitchen Over the next 90 days, patient will achieve adherence  to monitoring guidelines and medication adherence to achieve therapeutic efficacy through collaboration with PharmD and provider.    Interventions: . 1:1 collaboration with Crecencio Mc, MD regarding development and update of comprehensive plan of care as evidenced by provider attestation and co-signature . Inter-disciplinary care team collaboration (see longitudinal plan of care) . Comprehensive medication review performed; medication list updated in electronic medical record  SDOH: . Reports stress lately related to managing relationship with daughter. Continues to collaborate w/ LCSW.   Health Maintenance: . Received first dose of Pfizer COVID vaccine. Praised for doing this. Notes some chills that evening. Discussed potential for more immune mediated side effects with second dose.   Depression/Anxiety: . Moderately well managed at this time given patient report; current treatment: desipramine 50 mg QAM, aripiprazole 5 mg QAM, clorazepate 7.5 mg TID PRN - usually just needing at night to help sleep, sometimes another dose during the day if she is  having an anxious day; trazodone 50 mg QPM, follows w/ Dr. Casimiro Needle, LCSW . Encouraged continued collaboration w/ psychiatry, therapy. Discussed being careful to avoid falls (moving carefully, avoiding trip hazards)  Osteopenia: . Current treatment: Prolia Q6 months . Last DEXA: 04/2019 - t score -2.1 at femoral neck . Supplementation: Vitamin D 2000 units daily; reports her daily intake includes at least 1 serving of yogurt, sometimes cheese or oat milk . Reviewed goal calcium 1200 mg daily + vitamin D at least 800 units daily. Discussed that given daily dairy serving (~300 mg Ca) + miscellaneous sources (~250 mg), recommend choosing an OTC calcium supplement ~400-600 mg calcium daily. Separate administration from other dairy sources. Continue vitamin D daily.    Patient Goals/Self-Care Activities . Over the next 90 days, patient will:  - take medications as prescribed - contact the Care Team with any questions or concerns  Follow Up Plan: Goals of pharmacy care met. Closing pharmacy CCM case.        Medication Assistance: None required.  Patient affirms current coverage meets needs.  Patient's preferred pharmacy is:  [id number] [address] [place] [id number]: [phone]: [phone]   Follow Up:  Patient agrees to Care Plan and Follow-up.  Plan: Pharmacy goals of care completed. Closing Pharmacy CCM case  Catie TDarnelle Maffucci PharmD, [place] CPP [place]

## 2020-11-03 DIAGNOSIS — F330 Major depressive disorder, recurrent, mild: Secondary | ICD-10-CM | POA: Diagnosis not present

## 2020-11-03 DIAGNOSIS — F419 Anxiety disorder, unspecified: Secondary | ICD-10-CM | POA: Diagnosis not present

## 2020-11-04 ENCOUNTER — Ambulatory Visit: Payer: Medicare PPO | Attending: Internal Medicine

## 2020-11-04 ENCOUNTER — Other Ambulatory Visit: Payer: Self-pay

## 2020-11-04 DIAGNOSIS — Z23 Encounter for immunization: Secondary | ICD-10-CM

## 2020-11-04 MED ORDER — PFIZER-BIONT COVID-19 VAC-TRIS 30 MCG/0.3ML IM SUSP
INTRAMUSCULAR | 0 refills | Status: DC
  Filled 2020-11-04: qty 0.3, 1d supply, fill #0

## 2020-11-04 NOTE — Progress Notes (Addendum)
   Covid-19 Vaccination Clinic  Name:  Holly [name]    MRN: [id number] DOB: 11-24-1945  11/04/2020  [name] was observed post Covid-19 immunization for 30 minutes based on pre-vaccination screening without incident. She was provided with Vaccine Information Sheet and instruction to access the V-Safe system.   [name] was instructed to call 911 with any severe reactions post vaccine: Marland Kitchen Difficulty breathing  . Swelling of face and throat  . A fast heartbeat  . A bad rash all over body  . Dizziness and weakness   Immunizations Administered    Name Date Dose VIS Date Route   PFIZER Comrnaty(Gray TOP) Covid-19 Vaccine 11/04/2020  9:03 AM 0.3 mL 05/22/2020 Intramuscular   Manufacturer: [place]   Lot: [id number]   NDC: [place], PharmD, MBA Clinical Acute Care Pharmacist

## 2020-11-07 ENCOUNTER — Ambulatory Visit: Payer: Medicare PPO | Admitting: *Deleted

## 2020-11-07 DIAGNOSIS — J300 Vasomotor rhinitis: Secondary | ICD-10-CM

## 2020-11-07 DIAGNOSIS — Z8669 Personal history of other diseases of the nervous system and sense organs: Secondary | ICD-10-CM

## 2020-11-07 DIAGNOSIS — R590 Localized enlarged lymph nodes: Secondary | ICD-10-CM

## 2020-11-07 DIAGNOSIS — F331 Major depressive disorder, recurrent, moderate: Secondary | ICD-10-CM

## 2020-11-07 DIAGNOSIS — F502 Bulimia nervosa: Secondary | ICD-10-CM

## 2020-11-07 DIAGNOSIS — G2111 Neuroleptic induced parkinsonism: Secondary | ICD-10-CM

## 2020-11-07 DIAGNOSIS — F419 Anxiety disorder, unspecified: Secondary | ICD-10-CM
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
## 2020-11-07 NOTE — Patient Instructions (Signed)
Visit Information  PATIENT GOALS: Goals Addressed              This Visit's Progress   .  Keep 50B Restraining Order in Place to [place] from Daughter. (pt-stated)   On track     Timeframe:  Short-Term Goal Priority:  High Start Date:    09/03/2020                         Expected End Date:   12/18/2020           Follow Up Date:  12/18/2020 at 9:00am.  Patient Goals/Self-Care Activities: . Now that you have decided to keep the 50B Restraining Order in place against your daughter, Jamisyn Langer, do all the things that you wanted to do for many years but were unable to because she was residing there, such as cleaning, de-cluttering and renovating your home.     Marland Kitchen Keep bi-weekly appointments with [name], LCSW with [facility] and [place], for counseling and supportive therapy services. Marland Kitchen Keep quarterly appointments with Dr. [name], Psychiatrist with [facility] and [place], for medication management/administration and psychotherapeutic services.   Marland Kitchen Keep tri-weekly appointments with [name], [place] Specialist with [facility], at the [facility]. Marland Kitchen Keep weekly telephone sessions with LCSW for coaching and individual counseling services.     .  Reduce and Manage Symptoms of Depression, Anxiety and Bulimia. (pt-stated)   On track     Timeframe:  Short-Term Goal Priority:  High Start Date:      09/03/2020                       Expected End Date:   12/18/2020                 Follow Up Date:   12/18/2020 at 9:00am   Patient Goals/Self Care Activities: . Continue to obtain adequate rest and maintain a healthy, well-balanced diet, exercise at least 2-3 times per week, or as tolerated, and stay well-hydrated. . Continue to take psychotropic medications exactly as prescribed. Marland Kitchen Spend time and/or talk with friends and neighbors at least 2 to 3 times per week. . Continue to practice proper use of deep  breathing exercises, relaxation techniques and mindfulness meditation strategies, daily.  Marland Kitchen Keep bi-weekly appointments with [name], LCSW with [facility] [place], for counseling and supportive therapy services. Marland Kitchen Keep quarterly appointments with Dr. [name], Psychiatrist with [facility] [place], for medication management/administration and psychotherapeutic services.  Marland Kitchen Keep weekly telephonic sessions with LCSW to receive coaching and individual counseling services.  Marland Kitchen Keep tri-weekly appointments with [name], [place] Specialist with [facility], at the [facility]. . Continue to refer back to the list of all the positive things that you have been able to do, see and experience, since your daughter moved out of the home, to remind yourself of how far you have come. . Make a list of all the positive attributes that you posses, that you never realized you had, or that have laid dormant for many years, and celebrate your successes.       Patient verbalizes understanding of instructions provided today and agrees to view in [place].   Telephone follow up appointment with care management team member scheduled for:  12/18/2020 at [place] LCSW Licensed Clinical Social Worker [place]  [phone]

## 2020-11-20 DIAGNOSIS — F419 Anxiety disorder, unspecified: Secondary | ICD-10-CM | POA: Diagnosis not present

## 2020-11-20 DIAGNOSIS — F330 Major depressive disorder, recurrent, mild: Secondary | ICD-10-CM | POA: Diagnosis not present

## 2020-11-24 ENCOUNTER — Ambulatory Visit (INDEPENDENT_AMBULATORY_CARE_PROVIDER_SITE_OTHER): Payer: Medicare PPO

## 2020-11-24 ENCOUNTER — Other Ambulatory Visit: Payer: Self-pay

## 2020-11-24 VITALS — BP 115/74 | HR 78 | Temp 96.7°F | Resp 14 | Ht 66.0 in | Wt 167.8 lb

## 2020-11-24 DIAGNOSIS — Z0000 Encounter for general adult medical examination without abnormal findings: Secondary | ICD-10-CM

## 2020-11-24 NOTE — Patient Instructions (Addendum)
Ms. Whiters , Thank you for taking time to come for your Medicare Wellness Visit. I appreciate your ongoing commitment to your health goals. Please review the following plan we discussed and let me know if I can assist you in the future.   These are the goals we discussed:  Goals       Patient Stated     I like to lose about 20lb (pt-stated)      Walk more for exercise Low carb diet       Keep 50B Restraining Order in Place to [place] from Daughter. (pt-stated)      Timeframe:  Short-Term Goal Priority:  High Start Date:    09/03/2020                         Expected End Date:   12/18/2020           Follow Up Date:  12/18/2020 at 9:00am.  Patient Goals/Self-Care Activities: Now that you have decided to keep the 50B Restraining Order in place against your daughter, Laria Grimmett, do all the things that you wanted to do for many years but were unable to because she was residing there, such as cleaning, de-cluttering and renovating your home.     Keep bi-weekly appointments with [name], LCSW with [facility] [place], for counseling and supportive therapy services. Keep quarterly appointments with Dr. [name], Psychiatrist with [facility] [place], for medication management/administration and psychotherapeutic services.   Keep tri-weekly appointments with [name], [place] Specialist with [facility], at the [facility]. Keep weekly telephone sessions with LCSW for coaching and individual counseling services.        Reduce and Manage Symptoms of Depression, Anxiety and Bulimia. (pt-stated)      Timeframe:  Short-Term Goal Priority:  High Start Date:      09/03/2020                       Expected End Date:   12/18/2020                 Follow Up Date:   12/18/2020 at 9:00am   Patient Goals/Self Care Activities: Continue to obtain adequate rest and maintain a healthy, well-balanced diet,  exercise at least 2-3 times per week, or as tolerated, and stay well-hydrated. Continue to take psychotropic medications exactly as prescribed. Spend time and/or talk with friends and neighbors at least 2 to 3 times per week. Continue to practice proper use of deep breathing exercises, relaxation techniques and mindfulness meditation strategies, daily.  Keep bi-weekly appointments with [name], LCSW with [facility] [place], for counseling and supportive therapy services. Keep quarterly appointments with Dr. [name], Psychiatrist with [facility] [place], for medication management/administration and psychotherapeutic services.  Keep weekly telephonic sessions with LCSW to receive coaching and individual counseling services.  Keep tri-weekly appointments with [name], [place] Specialist with [facility], at the [facility]. Continue to refer back to the list of all the positive things that you have been able to do, see and experience, since your daughter moved out of the home, to remind yourself of how far you have come. Make a list of all the positive attributes that you posses, that you never realized you had, or that have laid dormant for many years, and celebrate your successes.  Other     Stay hydrated        This is a list of the screening recommended for you and due dates:  Health Maintenance  Topic Date Due   Zoster (Shingles) Vaccine (1 of 2) 02/24/2021*   Tetanus Vaccine  11/24/2021*   COVID-19 Vaccine (3 - Pfizer risk series) 12/02/2020   Mammogram  08/06/2021   Cologuard (Stool DNA test)  04/12/2022   DEXA scan (bone density measurement)  Completed   Hepatitis C Screening: USPSTF Recommendation to screen - Ages 3-79 yo.  Completed   HPV Vaccine  Aged Out  *Topic was postponed. The date shown is not the original due date.    Advanced directives: End of life planning; Advance  aging; Advanced directives discussed.  Copy of current HCPOA/Living Will requested.    Conditions/risks identified: none new  Follow up in one year for your annual wellness visit   Dr. Lupita Dawn Low GI diet provided   Preventive Care 65 Years and Older, Female Preventive care refers to lifestyle choices and visits with your health care provider that can promote health and wellness. What does preventive care include? A yearly physical exam. This is also called an annual well check. Dental exams once or twice a year. Routine eye exams. Ask your health care provider how often you should have your eyes checked. Personal lifestyle choices, including: Daily care of your teeth and gums. Regular physical activity. Eating a healthy diet. Avoiding tobacco and drug use. Limiting alcohol use. Practicing safe sex. Taking low-dose aspirin every day. Taking vitamin and mineral supplements as recommended by your health care provider. What happens during an annual well check? The services and screenings done by your health care provider during your annual well check will depend on your age, overall health, lifestyle risk factors, and family history of disease. Counseling  Your health care provider may ask you questions about your: Alcohol use. Tobacco use. Drug use. Emotional well-being. Home and relationship well-being. Sexual activity. Eating habits. History of falls. Memory and ability to understand (cognition). Work and work Statistician. Reproductive health. Screening  You may have the following tests or measurements: Height, weight, and BMI. Blood pressure. Lipid and cholesterol levels. These may be checked every 5 years, or more frequently if you are over 50 years old. Skin check. Lung cancer screening. You may have this screening every year starting at age 41 if you have a 30-pack-year history of smoking and currently smoke or have quit within the past 15 years. Fecal occult blood  test (FOBT) of the stool. You may have this test every year starting at age 20. Flexible sigmoidoscopy or colonoscopy. You may have a sigmoidoscopy every 5 years or a colonoscopy every 10 years starting at age 37. Hepatitis C blood test. Hepatitis B blood test. Sexually transmitted disease (STD) testing. Diabetes screening. This is done by checking your blood sugar (glucose) after you have not eaten for a while (fasting). You may have this done every 1-3 years. Bone density scan. This is done to screen for osteoporosis. You may have this done starting at age 5. Mammogram. This may be done every 1-2 years. Talk to your health care provider about how often you should have regular mammograms. Talk with your health care provider about your test results, treatment options, and if necessary, the need for more tests. Vaccines  Your health care provider may recommend certain vaccines, such as: Influenza vaccine. This is recommended every year. Tetanus, diphtheria, and acellular pertussis (Tdap, Td)  vaccine. You may need a Td booster every 10 years. Zoster vaccine. You may need this after age 26. Pneumococcal 13-valent conjugate (PCV13) vaccine. One dose is recommended after age 68. Pneumococcal polysaccharide (PPSV23) vaccine. One dose is recommended after age 27. Talk to your health care provider about which screenings and vaccines you need and how often you need them. This information is not intended to replace advice given to you by your health care provider. Make sure you discuss any questions you have with your health care provider. Document Released: 06/27/2015 Document Revised: 02/18/2016 Document Reviewed: 04/01/2015 Elsevier Interactive Patient Education  2017 [employer] Prevention in the Home Falls can cause injuries. They can happen to people of all ages. There are many things you can do to make your home safe and to help prevent falls. What can I do on the outside of my  home? Regularly fix the edges of walkways and driveways and fix any cracks. Remove anything that might make you trip as you walk through a door, such as a raised step or threshold. Trim any bushes or trees on the path to your home. Use bright outdoor lighting. Clear any walking paths of anything that might make someone trip, such as rocks or tools. Regularly check to see if handrails are loose or broken. Make sure that both sides of any steps have handrails. Any raised decks and porches should have guardrails on the edges. Have any leaves, snow, or ice cleared regularly. Use sand or salt on walking paths during winter. Clean up any spills in your garage right away. This includes oil or grease spills. What can I do in the bathroom? Use night lights. Install grab bars by the toilet and in the tub and shower. Do not use towel bars as grab bars. Use non-skid mats or decals in the tub or shower. If you need to sit down in the shower, use a plastic, non-slip stool. Keep the floor dry. Clean up any water that spills on the floor as soon as it happens. Remove soap buildup in the tub or shower regularly. Attach bath mats securely with double-sided non-slip rug tape. Do not have throw rugs and other things on the floor that can make you trip. What can I do in the bedroom? Use night lights. Make sure that you have a light by your bed that is easy to reach. Do not use any sheets or blankets that are too big for your bed. They should not hang down onto the floor. Have a firm chair that has side arms. You can use this for support while you get dressed. Do not have throw rugs and other things on the floor that can make you trip. What can I do in the kitchen? Clean up any spills right away. Avoid walking on wet floors. Keep items that you use a lot in easy-to-reach places. If you need to reach something above you, use a strong step stool that has a grab bar. Keep electrical cords out of the way. Do  not use floor polish or wax that makes floors slippery. If you must use wax, use non-skid floor wax. Do not have throw rugs and other things on the floor that can make you trip. What can I do with my stairs? Do not leave any items on the stairs. Make sure that there are handrails on both sides of the stairs and use them. Fix handrails that are broken or loose. Make sure that handrails are as long  as the stairways. Check any carpeting to make sure that it is firmly attached to the stairs. Fix any carpet that is loose or worn. Avoid having throw rugs at the top or bottom of the stairs. If you do have throw rugs, attach them to the floor with carpet tape. Make sure that you have a light switch at the top of the stairs and the bottom of the stairs. If you do not have them, ask someone to add them for you. What else can I do to help prevent falls? Wear shoes that: Do not have high heels. Have rubber bottoms. Are comfortable and fit you well. Are closed at the toe. Do not wear sandals. If you use a stepladder: Make sure that it is fully opened. Do not climb a closed stepladder. Make sure that both sides of the stepladder are locked into place. Ask someone to hold it for you, if possible. Clearly mark and make sure that you can see: Any grab bars or handrails. First and last steps. Where the edge of each step is. Use tools that help you move around (mobility aids) if they are needed. These include: Canes. Walkers. Scooters. Crutches. Turn on the lights when you go into a dark area. Replace any light bulbs as soon as they burn out. Set up your furniture so you have a clear path. Avoid moving your furniture around. If any of your floors are uneven, fix them. If there are any pets around you, be aware of where they are. Review your medicines with your doctor. Some medicines can make you feel dizzy. This can increase your chance of falling. Ask your doctor what other things that you can do to  help prevent falls. This information is not intended to replace advice given to you by your health care provider. Make sure you discuss any questions you have with your health care provider. Document Released: 03/27/2009 Document Revised: 11/06/2015 Document Reviewed: 07/05/2014 Elsevier Interactive Patient Education  2017 [employer].

## 2020-11-24 NOTE — Progress Notes (Signed)
**Note Holly-Identified via Obfuscation** Subjective:   Holly [name] is a 75 y.o. female who presents for Medicare Annual (Subsequent) preventive examination.  Review of Systems    No ROS.  Medicare Wellness   Cardiac Risk Factors include: advanced age (>77men, >54 women)     Objective:    Today's Vitals   11/24/20 1123  BP: 115/74  Pulse: 78  Resp: 14  Temp: (!) 96.7 F (35.9 C)  SpO2: 100%  Weight: 167 lb 12.8 oz (76.1 kg)  Height: 5\' 6"  (1.676 m)   Body mass index is 27.08 kg/m.  Advanced Directives 11/24/2020 09/16/2020 09/03/2020 11/22/2019 11/21/2018 10/22/2018 04/08/2018  Does Patient Have a Medical Advance Directive? Yes Yes No Yes Yes Yes Yes  Type of Industrial/product designer of [place];Living will - [place];Living will [place];Living will [place];Living will [place]  Does patient want to make changes to medical advance directive? No - Patient declined No - Patient declined - No - Patient declined No - Patient declined - -  Copy of [place] in Chart? No - copy requested Yes - validated most recent copy scanned in chart (See row information) - No - copy requested No - copy requested No - copy requested -  Would patient like information on creating a medical advance directive? - No - Patient declined No - Patient declined - - - No - Patient declined    Current Medications (verified) Outpatient Encounter Medications as of 11/24/2020  Medication Sig   ARIPiprazole (ABILIFY) 5 MG tablet Take 5 mg by mouth daily.   Cholecalciferol (VITAMIN D) 50 MCG (2000 UT) CAPS Take 1 capsule by mouth daily.   clorazepate (TRANXENE) 7.5 MG tablet Take 7.5 mg by mouth 3 (three) times daily.   COVID-19 mRNA Vac-TriS, Pfizer, (PFIZER-BIONT COVID-19 VAC-TRIS) SUSP injection Inject into the muscle.   desipramine (NORPRAMIN) 25 MG tablet Take 50 mg by mouth every morning.   polyethylene glycol  (MIRALAX / GLYCOLAX) 17 g packet Take 17 g by mouth daily as needed.   traZODone (DESYREL) 100 MG tablet Take 50 mg by mouth at bedtime.    No facility-administered encounter medications on file as of 11/24/2020.    Allergies (verified) Haemophilus influenzae vaccines, Oxycodone-acetaminophen, and Cephalexin   History: Past Medical History:  Diagnosis Date   Anxiety    Concussion with loss of consciousness 04/10/2017   Depression    Guillain Barr syndrome ([place]) 2005   Osteoporosis    Radial fracture 01/23/2013   Right arm just proximal to wrist. Secondary to fall. Patient has a history of osteoporosis and requires vitamin D and repeat DEXA scan if possible.    Squamous cell skin cancer 2011   Past Surgical History:  Procedure Laterality Date   ABDOMINAL HYSTERECTOMY  1989   APPENDECTOMY  1960   BREAST EXCISIONAL BIOPSY  1980   benign   BREAST SURGERY  1980   biopsy   WRIST FRACTURE SURGERY Right 2015   Family History  Problem Relation Age of Onset   Cancer Mother    Mental illness Sister    Bipolar disorder Sister    Depression Brother    Mental illness Brother    Parkinson's disease Brother    Dystonia Brother    Anxiety disorder Daughter    Social History   Socioeconomic History   Marital status: Widowed    Spouse name: Not on file   Number of  children: 1   Years of education: 12   Highest education level: 12th grade  Occupational History   Occupation: Retired  Tobacco Use   Smoking status: Former    Packs/day: 0.20    Pack years: 0.00    Types: Cigarettes    Quit date: 06/14/1977    Years since quitting: 43.4   Smokeless tobacco: Never  Vaping Use   Vaping Use: Never used  Substance and Sexual Activity   Alcohol use: Never    Alcohol/week: 0.0 standard drinks    Comment: social (about once a month)   Drug use: No   Sexual activity: Never  Other Topics Concern   Not on file  Social History Narrative   Not on file   Social Determinants of Health    Financial Resource Strain: Low Risk    Difficulty of Paying Living Expenses: Not hard at all  Food Insecurity: No Food Insecurity   Worried About Charity fundraiser in the Last Year: Never true   [place] in the Last Year: Never true  Transportation Needs: No Transportation Needs   Lack of Transportation (Medical): No   Lack of Transportation (Non-Medical): No  Physical Activity: Inactive   Days of Exercise per Week: 0 days   Minutes of Exercise per Session: 0 min  Stress: Stress Concern Present   Feeling of Stress : Rather much  Social Connections: Moderately Isolated   Frequency of Communication with Friends and Family: More than three times a week   Frequency of Social Gatherings with Friends and Family: More than three times a week   Attends Religious Services: 1 to 4 times per year   Active Member of [employer] or Organizations: No   Attends Archivist Meetings: Never   Marital Status: Widowed    Tobacco Counseling Counseling given: Not Answered   Clinical Intake:  Pre-visit preparation completed: Yes        Diabetes: No  How often do you need to have someone help you when you read instructions, pamphlets, or other written materials from your doctor or pharmacy?: 1 - Never   Interpreter Needed?: No      Activities of Daily Living In your present state of health, do you have any difficulty performing the following activities: 11/24/2020 09/03/2020  Hearing? N N  Vision? N N  Difficulty concentrating or making decisions? N N  Walking or climbing stairs? N N  Dressing or bathing? N N  Doing errands, shopping? N N  Preparing Food and eating ? N N  Using the Toilet? N N  In the past six months, have you accidently leaked urine? N N  Do you have problems with loss of bowel control? N N  Managing your Medications? N N  Managing your Finances? N N  Housekeeping or managing your Housekeeping? N N  Some recent data might be hidden    Patient  Care Team: Holly Mc, MD as PCP - General (Internal Medicine) Holly [name], RPH-CPP (Pharmacist) Holly [name], Holly Erie, LCSW as Social Worker (Licensed Clinical Social Worker)  Indicate any recent [employer] you may have received from other than [facility] providers in the past year (date may be approximate).     Assessment:   This is a routine wellness examination for [name].  Hearing/Vision screen Hearing Screening - Comments:: Patient is able to hear conversational tones without difficulty.  No issues reported.  Vision Screening - Comments:: Wears corrective lenses  They have seen their  ophthalmologist in the last 12 months.   Dietary issues and exercise activities discussed:  Healthy diet Good water intake   Goals Addressed               This Visit's Progress     Patient Stated     COMPLETED: Follow up with Primary Care Provider (pt-stated)        Follow up with her counselor as directed       I like to lose about 20lb (pt-stated)        Walk more for exercise Low carb diet       Other     Stay hydrated         Depression Screen PHQ 2/9 Scores 11/24/2020 09/03/2020 06/11/2020 11/27/2019 11/22/2019 01/31/2019 11/21/2018  PHQ - 2 Score 0 2 2 3  - 1 6  PHQ- 9 Score - 8 11 10  - 1 23  Exception Documentation - - - - Other- indicate reason in comment box - -  Not completed - - - - Visits with psychiatrist every 4 months - -    Fall Risk Fall Risk  11/24/2020 09/03/2020 08/29/2020 11/22/2019 06/22/2019  Falls in the past year? 1 0 0 0 1  Number falls in past yr: 0 0 0 - 1  Injury with Fall? - 0 0 - 1  Comment - - - - -  Risk Factor Category  - - - - -  Risk for fall due to : - No Fall Risks - - -  Risk for fall due to: Comment - - - - -  Follow up Falls evaluation completed Education provided;Falls prevention discussed Falls evaluation completed Falls evaluation completed Falls evaluation completed    FALL RISK PREVENTION PERTAINING TO THE HOME: Handrails in  used when climbing stairs? Yes Home free of loose throw rugs in walkways, pet beds, electrical cords, etc? Yes  Adequate lighting in your home to reduce risk of falls? Yes   ASSISTIVE DEVICES UTILIZED TO PREVENT FALLS: Life alert? No  Use of a cane, walker or w/c? No   TIMED UP AND GO: Was the test performed? Yes .  Length of time to ambulate 10 feet: 10 sec.   Gait steady and fast without use of assistive device  Cognitive Function: Patient is alert and oriented x3.  Denies difficulty focusing, making decisions, memory loss. MMSE/6CIT deferred. Normal by direct communication/observation.    MMSE - Mini Mental State Exam 11/21/2018 09/29/2017 09/15/2015  Not completed: Unable to complete Unable to complete -  Orientation to time - - 5  Orientation to Place - - 5  Registration - - 3  Attention/ Calculation - - 5  Recall - - 3  Language- name 2 objects - - 2  Language- repeat - - 1  Language- follow 3 step command - - 3  Language- read & follow direction - - 1  Write a sentence - - 1  Copy design - - 1  Total score - - 30        Immunizations Immunization History  Administered Date(s) Administered   PFIZER Comirnaty([place])Covid-19 Tri-Sucrose Vaccine 10/14/2020, 11/04/2020   Td 06/14/2006   TDAP status: Due, Education has been provided regarding the importance of this vaccine. Advised may receive this vaccine at local pharmacy or Health Dept. Aware to provide a copy of the vaccination record if obtained from local pharmacy or Health Dept. Verbalized acceptance and understanding.Deferred.   Health Maintenance Health Maintenance  Topic Date Due  Zoster Vaccines- Shingrix (1 of 2) 02/24/2021 (Originally 01/26/1965)   TETANUS/TDAP  11/24/2021 (Originally 06/14/2016)   COVID-19 Vaccine (3 - Pfizer risk series) 12/02/2020   MAMMOGRAM  08/06/2021   Fecal DNA (Cologuard)  04/12/2022   DEXA SCAN  Completed   Hepatitis C Screening  Completed   HPV VACCINES  Aged Out   Vision  Screening: Recommended annual ophthalmology exams for early detection of glaucoma and other disorders of the eye. Is the patient up to date with their annual eye exam?  Yes   Dental Screening: Recommended annual dental exams for proper oral hygiene.  Community Resource Referral / Chronic Care Management: CRR required this visit?  No   CCM required this visit?  No      Plan:   Keep all routine maintenance appointments.   I have personally reviewed and noted the following in the patient's chart:   Medical and social history Use of alcohol, tobacco or illicit drugs  Current medications and supplements including opioid prescriptions.  Functional ability and status Nutritional status Physical activity Advanced directives List of other physicians Hospitalizations, surgeries, and ER visits in previous 12 months Vitals Screenings to include cognitive, depression, and falls Referrals and appointments  In addition, I have reviewed and discussed with patient certain preventive protocols, quality metrics, and best practice recommendations. A written personalized care plan for preventive services as well as general preventive health recommendations were provided to patient.     Varney Biles, LPN   [id number]

## 2020-12-03 ENCOUNTER — Encounter: Payer: Self-pay | Admitting: Internal Medicine

## 2020-12-03 ENCOUNTER — Other Ambulatory Visit: Payer: Self-pay

## 2020-12-03 ENCOUNTER — Ambulatory Visit: Payer: Medicare PPO | Admitting: Internal Medicine

## 2020-12-03 VITALS — BP 142/78 | HR 67 | Temp 96.3°F | Resp 15 | Ht 66.0 in | Wt 167.2 lb

## 2020-12-03 DIAGNOSIS — G2111 Neuroleptic induced parkinsonism: Secondary | ICD-10-CM | POA: Diagnosis not present

## 2020-12-03 DIAGNOSIS — M79671 Pain in right foot: Secondary | ICD-10-CM

## 2020-12-03 DIAGNOSIS — G609 Hereditary and idiopathic neuropathy, unspecified: Secondary | ICD-10-CM

## 2020-12-03 DIAGNOSIS — R202 Paresthesia of skin: Secondary | ICD-10-CM

## 2020-12-03 DIAGNOSIS — R200 Anesthesia of skin: Secondary | ICD-10-CM

## 2020-12-03 DIAGNOSIS — E538 Deficiency of other specified B group vitamins: Secondary | ICD-10-CM

## 2020-12-03 DIAGNOSIS — F331 Major depressive disorder, recurrent, moderate: Secondary | ICD-10-CM

## 2020-12-03 DIAGNOSIS — M79672 Pain in left foot: Secondary | ICD-10-CM

## 2020-12-03 LAB — COMPREHENSIVE METABOLIC PANEL
ALT: 10 U/L (ref 0–35)
AST: 11 U/L (ref 0–37)
Albumin: 4.4 g/dL (ref 3.5–5.2)
Alkaline Phosphatase: 45 U/L (ref 39–117)
BUN: 21 mg/dL (ref 6–23)
CO2: 27 mEq/L (ref 19–32)
Calcium: 9.4 mg/dL (ref 8.4–10.5)
Chloride: 105 mEq/L (ref 96–112)
Creatinine, Ser: 0.94 mg/dL (ref 0.40–1.20)
GFR: 60.47 mL/min (ref >60.00)
Glucose, Bld: 84 mg/dL (ref 70–99)
Potassium: 4.5 mEq/L (ref 3.5–5.1)
Sodium: 140 mEq/L (ref 135–145)
Total Bilirubin: 0.4 mg/dL (ref 0.2–1.2)
Total Protein: 6.9 g/dL (ref 6.0–8.3)

## 2020-12-03 LAB — B12 AND FOLATE PANEL
Folate: 10.2 ng/mL (ref >5.9)
Vitamin B-12: 188 pg/mL — ABNORMAL LOW (ref 211–911)

## 2020-12-03 LAB — HEMOGLOBIN A1C: Hgb A1c MFr Bld: 5.9 % (ref 4.6–6.5)

## 2020-12-03 LAB — TSH: TSH: 1.82 u[IU]/mL (ref 0.35–4.50)

## 2020-12-03 MED ORDER — MELOXICAM 15 MG PO TABS: 15.0000 mg | ORAL_TABLET | Freq: Every day | ORAL | 0 refills | Status: DC
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
## 2020-12-03 NOTE — Patient Instructions (Signed)
Trial of meloxicam once daily for foot pain.  Can be combined with tylenol  Podiatry referral needed and will be done.   Labs today to look for reversible causes of neuropathy

## 2020-12-06 DIAGNOSIS — G629 Polyneuropathy, unspecified: Secondary | ICD-10-CM | POA: Insufficient documentation

## 2020-12-06 DIAGNOSIS — M79671 Pain in right foot: Secondary | ICD-10-CM | POA: Insufficient documentation

## 2020-12-06 NOTE — Assessment & Plan Note (Signed)
Etiology unclear.  Metabolic screen in in progress ADVISED that EMG/[place] studies would be indicated to differentiate between CTS and cervical spine origin

## 2020-12-06 NOTE — Assessment & Plan Note (Signed)
With history of surgeries to manage claw toes.  Recurrent.  Referral to Samara Deist

## 2020-12-06 NOTE — Assessment & Plan Note (Signed)
Symptoms have resolved.
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
## 2020-12-09 ENCOUNTER — Ambulatory Visit (INDEPENDENT_AMBULATORY_CARE_PROVIDER_SITE_OTHER): Payer: Medicare PPO

## 2020-12-09 ENCOUNTER — Other Ambulatory Visit (INDEPENDENT_AMBULATORY_CARE_PROVIDER_SITE_OTHER): Payer: Medicare PPO

## 2020-12-09 ENCOUNTER — Other Ambulatory Visit: Payer: Self-pay

## 2020-12-09 DIAGNOSIS — E538 Deficiency of other specified B group vitamins: Secondary | ICD-10-CM | POA: Insufficient documentation

## 2020-12-09 MED ORDER — CYANOCOBALAMIN 1000 MCG/ML IJ SOLN
1000.0000 ug | Freq: Once | INTRAMUSCULAR | Status: AC
  Administered 2020-12-09: 1000 ug via INTRAMUSCULAR

## 2020-12-09 NOTE — Assessment & Plan Note (Signed)
Found during workup for peripheral neuropathy (numbness and tingling of hands).

## 2020-12-09 NOTE — Progress Notes (Signed)
Patient presented for B 12 injection to left deltoid, patient voiced no concerns nor showed any signs of distress during injection. 

## 2020-12-09 NOTE — Addendum Note (Signed)
Addended by: Crecencio Mc on: 12/09/2020 10:04 AM   Modules accepted: Orders

## 2020-12-11 LAB — INTRINSIC FACTOR ANTIBODIES: Intrinsic Factor: NEGATIVE

## 2020-12-11 LAB — FOLATE RBC: RBC Folate: 756 ng/mL RBC (ref >280)

## 2020-12-17 ENCOUNTER — Telehealth: Payer: Self-pay

## 2020-12-17 DIAGNOSIS — F332 Major depressive disorder, recurrent severe without psychotic features: Secondary | ICD-10-CM | POA: Diagnosis not present

## 2020-12-17 DIAGNOSIS — F419 Anxiety disorder, unspecified: Secondary | ICD-10-CM | POA: Diagnosis not present

## 2020-12-17 NOTE — Telephone Encounter (Signed)
Pt called in asking about her b12 injections. Patient was informed that Dr. Derrel Nip wants her to have one injection a week for 4 weeks and then after she can move on to the B12 Tablets. See result notes for B12 and folate panel from 12/03/20 and Intrinsic factor labs from 12/09/20. Holly [name] verbalized understanding and had no further questions. She scheduled for her next b12 injection on 12/18/20 and states she will schedule for the last 2 injections after her appointment.

## 2020-12-18 ENCOUNTER — Ambulatory Visit (INDEPENDENT_AMBULATORY_CARE_PROVIDER_SITE_OTHER): Payer: Medicare PPO

## 2020-12-18 ENCOUNTER — Other Ambulatory Visit: Payer: Self-pay

## 2020-12-18 ENCOUNTER — Ambulatory Visit: Payer: Medicare PPO | Admitting: *Deleted

## 2020-12-18 DIAGNOSIS — F502 Bulimia nervosa: Secondary | ICD-10-CM

## 2020-12-18 DIAGNOSIS — E538 Deficiency of other specified B group vitamins: Secondary | ICD-10-CM

## 2020-12-18 DIAGNOSIS — F419 Anxiety disorder, unspecified: Secondary | ICD-10-CM

## 2020-12-18 DIAGNOSIS — F331 Major depressive disorder, recurrent, moderate: Secondary | ICD-10-CM

## 2020-12-18 MED ORDER — CYANOCOBALAMIN 1000 MCG/ML IJ SOLN
1000.0000 ug | Freq: Once | INTRAMUSCULAR | Status: AC
  Administered 2020-12-18: 1000 ug via INTRAMUSCULAR
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
## 2020-12-18 NOTE — Progress Notes (Signed)
Pt came in today for B-12 injection given in right deltoid IM. Patient tolerated well with no signs of distress.
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
## 2020-12-25 ENCOUNTER — Other Ambulatory Visit: Payer: Self-pay

## 2020-12-25 ENCOUNTER — Ambulatory Visit: Payer: Medicare PPO

## 2020-12-25 ENCOUNTER — Ambulatory Visit (INDEPENDENT_AMBULATORY_CARE_PROVIDER_SITE_OTHER): Payer: Medicare PPO

## 2020-12-25 DIAGNOSIS — E538 Deficiency of other specified B group vitamins: Secondary | ICD-10-CM

## 2020-12-25 MED ORDER — CYANOCOBALAMIN 1000 MCG/ML IJ SOLN
1000.0000 ug | Freq: Once | INTRAMUSCULAR | Status: AC
  Administered 2020-12-25: 1000 ug via INTRAMUSCULAR

## 2020-12-25 NOTE — Progress Notes (Signed)
Patient presented for B 12 injection to left deltoid, patient voiced no concerns nor showed any signs of distress during injection. 

## 2020-12-30 DIAGNOSIS — S022XXS Fracture of nasal bones, sequela: Secondary | ICD-10-CM | POA: Diagnosis not present

## 2020-12-30 DIAGNOSIS — J3489 Other specified disorders of nose and nasal sinuses: Secondary | ICD-10-CM | POA: Diagnosis not present

## 2020-12-30 DIAGNOSIS — M950 Acquired deformity of nose: Secondary | ICD-10-CM | POA: Diagnosis not present

## 2021-01-01 ENCOUNTER — Ambulatory Visit (INDEPENDENT_AMBULATORY_CARE_PROVIDER_SITE_OTHER): Payer: Medicare PPO

## 2021-01-01 ENCOUNTER — Other Ambulatory Visit: Payer: Self-pay

## 2021-01-01 DIAGNOSIS — E538 Deficiency of other specified B group vitamins: Secondary | ICD-10-CM

## 2021-01-01 MED ORDER — CYANOCOBALAMIN 1000 MCG/ML IJ SOLN
1000.0000 ug | Freq: Once | INTRAMUSCULAR | Status: AC
  Administered 2021-01-01: 1000 ug via INTRAMUSCULAR

## 2021-01-01 NOTE — Progress Notes (Signed)
Patient presented for B 12 injection to right deltoid, patient voiced no concerns nor showed any signs of distress during injection. 

## 2021-01-03 ENCOUNTER — Other Ambulatory Visit: Payer: Self-pay | Admitting: Internal Medicine

## 2021-01-08 ENCOUNTER — Ambulatory Visit: Payer: Medicare PPO | Admitting: *Deleted

## 2021-01-08 DIAGNOSIS — F419 Anxiety disorder, unspecified: Secondary | ICD-10-CM

## 2021-01-08 DIAGNOSIS — J300 Vasomotor rhinitis: Secondary | ICD-10-CM

## 2021-01-08 DIAGNOSIS — Z8669 Personal history of other diseases of the nervous system and sense organs: Secondary | ICD-10-CM

## 2021-01-08 DIAGNOSIS — F5025 Bulimia nervosa, in remission: Secondary | ICD-10-CM

## 2021-01-08 DIAGNOSIS — N319 Neuromuscular dysfunction of bladder, unspecified: Secondary | ICD-10-CM

## 2021-01-08 DIAGNOSIS — F331 Major depressive disorder, recurrent, moderate: Secondary | ICD-10-CM

## 2021-01-08 DIAGNOSIS — H6991 Unspecified Eustachian tube disorder, right ear: Secondary | ICD-10-CM

## 2021-01-08 DIAGNOSIS — F502 Bulimia nervosa: Secondary | ICD-10-CM
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
## 2021-01-14 DIAGNOSIS — F330 Major depressive disorder, recurrent, mild: Secondary | ICD-10-CM | POA: Diagnosis not present

## 2021-01-16 ENCOUNTER — Ambulatory Visit: Payer: Medicare PPO | Admitting: *Deleted

## 2021-01-16 DIAGNOSIS — F331 Major depressive disorder, recurrent, moderate: Secondary | ICD-10-CM

## 2021-01-16 DIAGNOSIS — F502 Bulimia nervosa: Secondary | ICD-10-CM | POA: Diagnosis not present

## 2021-01-16 DIAGNOSIS — F419 Anxiety disorder, unspecified: Secondary | ICD-10-CM | POA: Diagnosis not present

## 2021-01-16 DIAGNOSIS — Z8669 Personal history of other diseases of the nervous system and sense organs: Secondary | ICD-10-CM | POA: Diagnosis not present
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
## 2021-01-16 NOTE — Patient Instructions (Signed)
Visit Information  PATIENT GOALS:  Goals Addressed               This Visit's Progress     Reduce and Manage Symptoms of Depression, Anxiety and Bulimia. (pt-stated)   On track     Timeframe:  Long-Range Goal Priority:  High Start Date:      09/03/2020                       Expected End Date:  05/20/2021             Follow-Up Date:  02/18/2021 at 9:00am  Patient Goals/Self Care Activities: Continue to take psychotropic medications exactly as prescribed. Spend time and/or talk with friends and neighbors at least 2 to 3 times per week. Continue to practice proper use of deep breathing exercises, relaxation techniques and mindfulness meditation strategies, daily.  Keep bi-weekly appointments with [name], LCSW with [facility] [place], for counseling and supportive therapy services. Keep quarterly appointments with Dr. [name], Psychiatrist with [facility] [place], for medication management/administration and psychotherapeutic services.  Keep monthly telephonic counseling sessions with LCSW to receive coaching and individual counseling services. Continue to consider allowing LCSW to place a referral for you to [facility] to receive ongoing mental health counseling and supportive services.        Patient verbalizes understanding of instructions provided today and agrees to view in [place].   Telephone follow-up appointment with care management team member scheduled for:  02/18/2021 at [place] LCSW Licensed Clinical Social Worker [place]  [phone]

## 2021-01-21 DIAGNOSIS — F330 Major depressive disorder, recurrent, mild: Secondary | ICD-10-CM | POA: Diagnosis not present

## 2021-01-21 DIAGNOSIS — F419 Anxiety disorder, unspecified: Secondary | ICD-10-CM | POA: Diagnosis not present

## 2021-02-03 ENCOUNTER — Telehealth: Payer: Self-pay | Admitting: Internal Medicine

## 2021-02-03 NOTE — Telephone Encounter (Signed)
Prolia injection scheduled 02/11/21

## 2021-02-11 ENCOUNTER — Other Ambulatory Visit: Payer: Self-pay

## 2021-02-11 ENCOUNTER — Ambulatory Visit (INDEPENDENT_AMBULATORY_CARE_PROVIDER_SITE_OTHER): Payer: Medicare PPO

## 2021-02-11 DIAGNOSIS — M810 Age-related osteoporosis without current pathological fracture: Secondary | ICD-10-CM | POA: Diagnosis not present

## 2021-02-11 MED ORDER — DENOSUMAB 60 MG/ML ~~LOC~~ SOSY
60.0000 mg | PREFILLED_SYRINGE | Freq: Once | SUBCUTANEOUS | Status: AC
  Administered 2021-02-11: 60 mg via SUBCUTANEOUS

## 2021-02-11 NOTE — Progress Notes (Signed)
Patient presented for 6-month Prolia injection SQ to left arm. Patient tolerated well. 

## 2021-02-18 ENCOUNTER — Ambulatory Visit: Payer: Medicare PPO | Admitting: *Deleted

## 2021-02-18 DIAGNOSIS — F502 Bulimia nervosa: Secondary | ICD-10-CM

## 2021-02-18 DIAGNOSIS — F419 Anxiety disorder, unspecified: Secondary | ICD-10-CM

## 2021-02-18 DIAGNOSIS — Z8669 Personal history of other diseases of the nervous system and sense organs: Secondary | ICD-10-CM

## 2021-02-18 DIAGNOSIS — F331 Major depressive disorder, recurrent, moderate: Secondary | ICD-10-CM

## 2021-02-18 DIAGNOSIS — G609 Hereditary and idiopathic neuropathy, unspecified: Secondary | ICD-10-CM
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
## 2021-02-19 DIAGNOSIS — F330 Major depressive disorder, recurrent, mild: Secondary | ICD-10-CM | POA: Diagnosis not present

## 2021-02-19 DIAGNOSIS — F419 Anxiety disorder, unspecified: Secondary | ICD-10-CM | POA: Diagnosis not present

## 2021-03-13 DIAGNOSIS — F502 Bulimia nervosa: Secondary | ICD-10-CM

## 2021-03-13 DIAGNOSIS — F419 Anxiety disorder, unspecified: Secondary | ICD-10-CM | POA: Diagnosis not present

## 2021-03-13 DIAGNOSIS — G609 Hereditary and idiopathic neuropathy, unspecified: Secondary | ICD-10-CM

## 2021-03-13 DIAGNOSIS — F331 Major depressive disorder, recurrent, moderate: Secondary | ICD-10-CM | POA: Diagnosis not present

## 2021-03-18 ENCOUNTER — Ambulatory Visit: Payer: Medicare PPO | Admitting: *Deleted

## 2021-03-18 DIAGNOSIS — N319 Neuromuscular dysfunction of bladder, unspecified: Secondary | ICD-10-CM

## 2021-03-18 DIAGNOSIS — Z8669 Personal history of other diseases of the nervous system and sense organs: Secondary | ICD-10-CM

## 2021-03-18 DIAGNOSIS — F419 Anxiety disorder, unspecified: Secondary | ICD-10-CM

## 2021-03-18 DIAGNOSIS — F331 Major depressive disorder, recurrent, moderate: Secondary | ICD-10-CM

## 2021-03-18 DIAGNOSIS — F502 Bulimia nervosa: Secondary | ICD-10-CM

## 2021-03-18 DIAGNOSIS — H6991 Unspecified Eustachian tube disorder, right ear: Secondary | ICD-10-CM
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
## 2021-03-25 DIAGNOSIS — F330 Major depressive disorder, recurrent, mild: Secondary | ICD-10-CM | POA: Diagnosis not present

## 2021-04-02 DIAGNOSIS — F330 Major depressive disorder, recurrent, mild: Secondary | ICD-10-CM | POA: Diagnosis not present

## 2021-04-02 DIAGNOSIS — F419 Anxiety disorder, unspecified: Secondary | ICD-10-CM | POA: Diagnosis not present

## 2021-04-13 DIAGNOSIS — F331 Major depressive disorder, recurrent, moderate: Secondary | ICD-10-CM

## 2021-04-14 ENCOUNTER — Telehealth (INDEPENDENT_AMBULATORY_CARE_PROVIDER_SITE_OTHER): Payer: Medicare PPO | Admitting: Internal Medicine

## 2021-04-14 ENCOUNTER — Telehealth: Payer: Self-pay | Admitting: Internal Medicine

## 2021-04-14 ENCOUNTER — Encounter: Payer: Self-pay | Admitting: Internal Medicine

## 2021-04-14 DIAGNOSIS — F5104 Psychophysiologic insomnia: Secondary | ICD-10-CM | POA: Diagnosis not present

## 2021-04-14 DIAGNOSIS — J069 Acute upper respiratory infection, unspecified: Secondary | ICD-10-CM | POA: Diagnosis not present

## 2021-04-14 MED ORDER — HYDROCOD POLST-CPM POLST ER 10-8 MG/5ML PO SUER: 5.0000 mL | Freq: Every evening | ORAL | 0 refills | Status: DC | PRN

## 2021-04-14 MED ORDER — PREDNISONE 10 MG PO TABS: ORAL_TABLET | ORAL | 0 refills | Status: DC

## 2021-04-14 NOTE — Assessment & Plan Note (Signed)
PATIENT  Taking ambien for management of chronic insomnia.  She has been advised to suspend this medication during use of tussionex

## 2021-04-14 NOTE — Telephone Encounter (Signed)
Called pt and scheduled pt for a virtual visit today with Dr. Derrel Nip at 1:30pm.

## 2021-04-14 NOTE — Progress Notes (Signed)
Virtual Visit via CAregility Note  This visit type was conducted due to national recommendations for restrictions regarding the COVID-19 pandemic (e.g. social distancing).  This format is felt to be most appropriate for this patient at this time.  All issues noted in this document were discussed and addressed.  No physical exam was performed (except for noted visual exam findings with Video Visits).   I connected withNAME@ on 04/14/21 at  1:30 PM EDT by a video enabled telemedicine application and verified that I am speaking with the correct person using two identifiers. Location patient: home Location provider: work or home office Persons participating in the virtual visit: patient, provider  I discussed the limitations, risks, security and privacy concerns of performing an evaluation and management service by telephone and the availability of in person appointments. I also discussed with the patient that there may be a patient responsible charge related to this service. The patient expressed understanding and agreed to proceed.  Reason for visit: lower respiratory infection  HPI:  1 week history of cough, sinus congestion, body aches,  chills  and headache.  All but the nocturnal cough have resolved, ND IS KEEPING HER AWAKE ALL NIGHT IN SPITE OF PROPPING HE Ead up.  Denies dyspnea with exertion, pleurisy,  wheezing  and orthopnea .  No sinus tenderness based on today's directive exam. Starting to lose her voice. .   Covid negative home test taken on day 3  of symptoms    ROS: See pertinent positives and negatives per HPI.  Past Medical History:  Diagnosis Date   Anxiety    Concussion with loss of consciousness 04/10/2017   Depression    Guillain Barr syndrome [facility]) 2005   Osteoporosis    Radial fracture 01/23/2013   Right arm just proximal to wrist. Secondary to fall. Patient has a history of osteoporosis and requires vitamin D and repeat DEXA scan if possible.    Squamous cell skin  cancer 2011    Past Surgical History:  Procedure Laterality Date   ABDOMINAL HYSTERECTOMY  1989   APPENDECTOMY  1960   BREAST EXCISIONAL BIOPSY  1980   benign   BREAST SURGERY  1980   biopsy   WRIST FRACTURE SURGERY Right 2015    Family History  Problem Relation Age of Onset   Cancer Mother    Mental illness Sister    Bipolar disorder Sister    Depression Brother    Mental illness Brother    Parkinson's disease Brother    Dystonia Brother    Anxiety disorder Daughter     SOCIAL HX:  reports that she quit smoking about 43 years ago. Her smoking use included cigarettes. She smoked an average of .2 packs per day. She has never used smokeless tobacco. She reports that she does not drink alcohol and does not use drugs.    Current Outpatient Medications:    ARIPiprazole (ABILIFY) 5 MG tablet, Take 5 mg by mouth daily., Disp: , Rfl:    chlorpheniramine-HYDROcodone (TUSSIONEX PENNKINETIC ER) 10-8 MG/5ML SUER, Take 5 mLs by mouth at bedtime as needed., Disp: 140 mL, Rfl: 0   Cholecalciferol (VITAMIN D) 50 MCG (2000 UT) CAPS, Take 1 capsule by mouth daily., Disp: , Rfl:    clorazepate (TRANXENE) 7.5 MG tablet, Take 7.5 mg by mouth 3 (three) times daily., Disp: , Rfl:    desipramine (NORPRAMIN) 25 MG tablet, Take 50 mg by mouth every morning., Disp: , Rfl:    meloxicam (MOBIC) 15 MG tablet,  TAKE 1 TABLET (15 MG TOTAL) BY MOUTH DAILY., Disp: 30 tablet, Rfl: 0   polyethylene glycol (MIRALAX / GLYCOLAX) 17 g packet, Take 17 g by mouth daily as needed., Disp: , Rfl:    predniSONE (DELTASONE) 10 MG tablet, 6 tablets on Day 1 , then reduce by 1 tablet daily until gone, Disp: 21 tablet, Rfl: 0   zolpidem (AMBIEN CR) 12.5 MG CR tablet, Take 12.5 mg by mouth at bedtime as needed for sleep., Disp: , Rfl:    traZODone (DESYREL) 100 MG tablet, Take 50 mg by mouth at bedtime.  (Patient not taking: Reported on 04/14/2021), Disp: , Rfl:   EXAM:  VITALS per patient if applicable:  GENERAL: alert,  oriented, appears well and in no acute distress  HEENT: atraumatic, conjunttiva clear, no obvious abnormalities on inspection of external nose and ears  NECK: normal movements of the head and neck  LUNGS: on inspection no signs of respiratory distress, breathing rate appears normal, no obvious gross SOB, gasping or wheezing  CV: no obvious cyanosis  MS: moves all visible extremities without noticeable abnormality  PSYCH/NEURO: pleasant and cooperative, no obvious depression or anxiety, speech and thought processing grossly intact  ASSESSMENT AND PLAN:  Discussed the following assessment and plan:  Viral URI with cough  Chronic insomnia  Viral URI with cough SYMPTOMS have been present for 7 days,  No fevers,  HOME COVID TEST IS  negative. Nocturnal cough present keeping her awake at night.  Prednisone taper and tussionex prescribed.  Advised to suspend ambien,  Use Robitussin DM for daytime cough   Chronic insomnia PATIENT  Taking ambien for management of chronic insomnia.  She has been advised to suspend this medication during use of tussionex     I discussed the assessment and treatment plan with the patient. The patient was provided an opportunity to ask questions and all were answered. The patient agreed with the plan and demonstrated an understanding of the instructions.   The patient was advised to call back or seek an in-person evaluation if the symptoms worsen or if the condition fails to improve as anticipated.   I spent 30 minutes dedicated to the care of this patient on the date of this encounter to include pre-visit review of his medical history,  Face-to-face time with the patient , and post visit ordering of testing and therapeutics.    Crecencio Mc, MD

## 2021-04-14 NOTE — Assessment & Plan Note (Signed)
SYMPTOMS have been present for 7 days,  No fevers,  HOME COVID TEST IS  negative. Nocturnal cough present keeping her awake at night.  Prednisone taper and tussionex prescribed.  Advised to suspend ambien,  Use Robitussin DM for daytime cough

## 2021-04-14 NOTE — Telephone Encounter (Signed)
Pt called in stating that she had symptoms of a virus a week ago. Pt stated that she started with a cough, body aches, and congested head. Pt stated she is still having symptoms that she took a Covid test and it came back neg. Pt have symptoms of a cough right now. Pt stated she was treating herself with otc tylenol.

## 2021-04-14 NOTE — Telephone Encounter (Signed)
Waiting on nurse triage message.

## 2021-04-15 ENCOUNTER — Ambulatory Visit (INDEPENDENT_AMBULATORY_CARE_PROVIDER_SITE_OTHER): Payer: Medicare PPO | Admitting: *Deleted

## 2021-04-15 DIAGNOSIS — N319 Neuromuscular dysfunction of bladder, unspecified: Secondary | ICD-10-CM

## 2021-04-15 DIAGNOSIS — F5104 Psychophysiologic insomnia: Secondary | ICD-10-CM

## 2021-04-15 DIAGNOSIS — J300 Vasomotor rhinitis: Secondary | ICD-10-CM

## 2021-04-15 DIAGNOSIS — F419 Anxiety disorder, unspecified: Secondary | ICD-10-CM

## 2021-04-15 DIAGNOSIS — F502 Bulimia nervosa: Secondary | ICD-10-CM

## 2021-04-15 DIAGNOSIS — F331 Major depressive disorder, recurrent, moderate: Secondary | ICD-10-CM

## 2021-04-15 DIAGNOSIS — G609 Hereditary and idiopathic neuropathy, unspecified: Secondary | ICD-10-CM

## 2021-04-15 DIAGNOSIS — Z8669 Personal history of other diseases of the nervous system and sense organs: Secondary | ICD-10-CM

## 2021-04-15 NOTE — Chronic Care Management (AMB) (Signed)
Chronic Care Management    Clinical Social Work Note  04/15/2021 Name: Holly [name] MRN: [id number] DOB: 05/06/1946  Holly [name] is a 75 y.o. year old female who is a primary care patient of Derrel Nip, Aris Everts, MD. The CCM team was consulted to assist the patient with chronic disease management and/or care coordination needs related to: [employer], Mental Health Counseling and Resources, and Grief Counseling.   Engaged with patient by telephone for follow-up visit in response to provider referral for social work chronic care management and care coordination services.   Consent to Services:  The patient was given information about Chronic Care Management services, agreed to services, and gave verbal consent prior to initiation of services.  Please see initial visit note for detailed documentation.   Patient agreed to services and consent obtained.   Assessment: Review of patient past medical history, allergies, medications, and health status, including review of relevant consultants reports was performed today as part of a comprehensive evaluation and provision of chronic care management and care coordination services.     SDOH (Social Determinants of Health) assessments and interventions performed:    Advanced Directives Status: Not addressed in this encounter.  CCM Care Plan  Allergies  Allergen Reactions   Haemophilus Influenzae Vaccines Other (See Comments)    Guillain Barre    Oxycodone-Acetaminophen Nausea And Vomiting   Cephalexin     Other reaction(s): HIVES    Outpatient Encounter Medications as of 04/15/2021  Medication Sig Note   ARIPiprazole (ABILIFY) 5 MG tablet Take 5 mg by mouth daily.    chlorpheniramine-HYDROcodone (TUSSIONEX PENNKINETIC ER) 10-8 MG/5ML SUER Take 5 mLs by mouth at bedtime as needed.    Cholecalciferol (VITAMIN D) 50 MCG (2000 UT) CAPS Take 1 capsule by mouth daily.    clorazepate (TRANXENE) 7.5 MG tablet Take 7.5 mg by mouth 3 (three)  times daily. 09/10/2020: PRN, generally QPM   desipramine (NORPRAMIN) 25 MG tablet Take 50 mg by mouth every morning.    meloxicam (MOBIC) 15 MG tablet TAKE 1 TABLET (15 MG TOTAL) BY MOUTH DAILY.    polyethylene glycol (MIRALAX / GLYCOLAX) 17 g packet Take 17 g by mouth daily as needed.    predniSONE (DELTASONE) 10 MG tablet 6 tablets on Day 1 , then reduce by 1 tablet daily until gone    traZODone (DESYREL) 100 MG tablet Take 50 mg by mouth at bedtime.  (Patient not taking: Reported on 04/14/2021)    zolpidem (AMBIEN CR) 12.5 MG CR tablet Take 12.5 mg by mouth at bedtime as needed for sleep.    No facility-administered encounter medications on file as of 04/15/2021.    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  Limited Social Support, Mental Health Concerns, Family and Relationship Dysfunction, Social Isolation, Limited Access to Caregiver, and Lacks Knowledge of [employer].  Care Plan : LCSW Plan of Care  Updates made by Francis Gaines, LCSW since 04/15/2021 12:00 AM     Problem: Reduce and Manage Symptoms of  Anxiety and Depression.   Priority: High     Long-Range Goal: Reduce and Manage Symptoms of Anxiety and Depression.   Start Date: 09/03/2020  Expected End Date: 05/20/2021  This Visit's Progress: On track  Recent Progress: On track  Priority: High  Note:   Current Barriers:   Severe Persistent Mental Health needs related to Anxiety and Depression requires Support, Education, Resources, Referrals and Care Coordination in order to meet unmet mental health needs.  Clinical Goal(s):  Patient will work with LCSW to reduce and manage symptoms of Anxiety and Depression, until established with a community provider.    Clinical Interventions:  Provided mental health support, counseling, coaching, education and interventions. Motivational Interviewing, Emotional/Supportive Counseling and Teaching/Coaching Strategies Performed.   Collaboration with Primary Care Physician, Dr. Deborra Medina regarding development and update of comprehensive plan of care as evidenced by provider attestation and co-signature. Patient Goals/Self-Care Activities: Keep monthly telephonic counseling sessions with LCSW, to reduce and manage symptoms of Anxiety and Depression, as well as to receive coaching, education, and supportive services.  LCSW collaboration to discuss amendments to [employer] against your daughter, now that you have decided to keep the Order in place for your own protection and safety. Encouraged to re-establish services with the [facility] of [place] (# [phone]), to access a wide range of supportive resources (I.e talking with a victim advocate, getting assistance with amending restraining order, planning for continued safety, accessing law enforcement officers, meeting with professionals to discuss civil and criminal legal issues, etc.).  Contact LCSW directly (# [id number]) if you have questions, need assistance, or if additional social work needs are identified between  now and our next scheduled telephone outreach call. Follow-Up Plan:  05/20/2021 at 9:30am   [place] Clinical Social Worker [place]  [phone]

## 2021-04-15 NOTE — Patient Instructions (Signed)
Visit Information  Patient verbalizes understanding of instructions provided today and agrees to view in [place].   Telephone follow up appointment with care management team member scheduled for:  05/20/2021 at 9:30am  [place] Licensed Clinical Social Worker [place]  [phone]

## 2021-04-29 DIAGNOSIS — F419 Anxiety disorder, unspecified: Secondary | ICD-10-CM | POA: Diagnosis not present

## 2021-04-29 DIAGNOSIS — F330 Major depressive disorder, recurrent, mild: Secondary | ICD-10-CM | POA: Diagnosis not present

## 2021-05-13 DIAGNOSIS — F331 Major depressive disorder, recurrent, moderate: Secondary | ICD-10-CM

## 2021-05-14 DIAGNOSIS — F332 Major depressive disorder, recurrent severe without psychotic features: Secondary | ICD-10-CM | POA: Diagnosis not present

## 2021-05-14 DIAGNOSIS — F419 Anxiety disorder, unspecified: Secondary | ICD-10-CM | POA: Diagnosis not present

## 2021-05-18 DIAGNOSIS — Z859 Personal history of malignant neoplasm, unspecified: Secondary | ICD-10-CM | POA: Diagnosis not present

## 2021-05-18 DIAGNOSIS — L578 Other skin changes due to chronic exposure to nonionizing radiation: Secondary | ICD-10-CM | POA: Diagnosis not present

## 2021-05-18 DIAGNOSIS — Z872 Personal history of diseases of the skin and subcutaneous tissue: Secondary | ICD-10-CM | POA: Diagnosis not present

## 2021-05-18 DIAGNOSIS — L821 Other seborrheic keratosis: Secondary | ICD-10-CM | POA: Diagnosis not present

## 2021-05-18 DIAGNOSIS — Z86018 Personal history of other benign neoplasm: Secondary | ICD-10-CM | POA: Diagnosis not present

## 2021-05-20 ENCOUNTER — Ambulatory Visit: Payer: Medicare PPO | Admitting: *Deleted

## 2021-05-20 DIAGNOSIS — H6991 Unspecified Eustachian tube disorder, right ear: Secondary | ICD-10-CM

## 2021-05-20 DIAGNOSIS — G252 Other specified forms of tremor: Secondary | ICD-10-CM

## 2021-05-20 DIAGNOSIS — F331 Major depressive disorder, recurrent, moderate: Secondary | ICD-10-CM

## 2021-05-20 DIAGNOSIS — J300 Vasomotor rhinitis: Secondary | ICD-10-CM

## 2021-05-20 DIAGNOSIS — F419 Anxiety disorder, unspecified: Secondary | ICD-10-CM

## 2021-05-20 DIAGNOSIS — Z8669 Personal history of other diseases of the nervous system and sense organs: Secondary | ICD-10-CM

## 2021-05-20 DIAGNOSIS — N319 Neuromuscular dysfunction of bladder, unspecified: Secondary | ICD-10-CM

## 2021-05-20 DIAGNOSIS — F502 Bulimia nervosa: Secondary | ICD-10-CM

## 2021-05-20 DIAGNOSIS — G609 Hereditary and idiopathic neuropathy, unspecified: Secondary | ICD-10-CM

## 2021-05-20 NOTE — Patient Instructions (Signed)
Visit Information  Thank you for taking time to visit with me today. Please don't hesitate to contact me if I can be of assistance to you before our next scheduled telephone appointment.  Following are the goals we discussed today:   Patient Goals/Self-Care Activities: Keep monthly telephonic counseling sessions with LCSW, to reduce and manage symptoms of Anxiety and Depression, as well as to receive coaching, education, and supportive services, until symptoms are well-managed. Please have difficult conversations with family members and mutual friends of you and your daughter's, explaining that, while you appreciate their willingness to help resolve the conflicts between the two of you, it would actually be in your best interests for them to remain uninvolved.  Remember, you do not have to explain yourself or justify your decisions and actions to anyone. Schedule an appointment with a representative from the [facility] of [place] (# [phone]), to addend 50B Restraining Order against your daughter.   Schedule an appointment with your estate attorney to begin making revisions to your Living Will. Enjoy your two-week holiday vacation in [place], visiting with friends and relatives.   Contact LCSW directly (# [id number]) if you have questions, need assistance, or if additional social work needs are identified between now and our next scheduled telephone outreach call.  Our next appointment is by telephone on 06/24/2021 at 9:00 am.  Please call the care guide team at [phone] if you need to cancel or reschedule your appointment.   If you are experiencing a Mental Health or [place] or need someone to talk to, please call the Suicide and Crisis Lifeline: 988 call the [place] National Suicide Prevention Lifeline: 651-357-9618 or TTY: (702) 431-2573 TTY (985) 832-0560) to talk to a trained counselor call 1-800-273-TALK (toll free, 24 hour hotline) go to  [facility] [address] [place] [phone]) call the [place]: [phone] call 911   Patient verbalizes understanding of instructions provided today and agrees to view in [place].   Nat Christen LCSW Licensed Clinical Social Worker [place]  [phone]

## 2021-05-20 NOTE — Chronic Care Management (AMB) (Signed)
Chronic Care Management    Clinical Social Work Note  05/20/2021 Name: Holly [name] MRN: [id number] DOB: [birth year]  Holly [name] is a 75 y.o. year old female who is a primary care patient of Derrel Nip, Aris Everts, MD. The CCM team was consulted to assist the patient with chronic disease management and/or care coordination needs related to: Appointment Scheduling Needs, Community Resources, Holiday representative, and Mental Health Counseling and Resources.   Engaged with patient by telephone for follow up visit in response to provider referral for social work chronic care management and care coordination services.   Consent to Services:  The patient was given information about Chronic Care Management services, agreed to services, and gave verbal consent prior to initiation of services.  Please see initial visit note for detailed documentation.   Patient agreed to services and consent obtained.   Assessment: Review of patient past medical history, allergies, medications, and health status, including review of relevant consultants reports was performed today as part of a comprehensive evaluation and provision of chronic care management and care coordination services.     SDOH (Social Determinants of Health) assessments and interventions performed:    Advanced Directives Status: See Care Plan for related entries.  CCM Care Plan  Allergies  Allergen Reactions   Haemophilus Influenzae Vaccines Other (See Comments)    Guillain Barre    Oxycodone-Acetaminophen Nausea And Vomiting   Cephalexin     Other reaction(s): HIVES    Outpatient Encounter Medications as of 05/20/2021  Medication Sig Note   ARIPiprazole (ABILIFY) 5 MG tablet Take 5 mg by mouth daily.    chlorpheniramine-HYDROcodone (TUSSIONEX PENNKINETIC ER) 10-8 MG/5ML SUER Take 5 mLs by mouth at bedtime as needed.    Cholecalciferol (VITAMIN D) 50 MCG (2000 UT) CAPS Take 1 capsule by mouth daily.    clorazepate (TRANXENE) 7.5  MG tablet Take 7.5 mg by mouth 3 (three) times daily. 09/10/2020: PRN, generally QPM   desipramine (NORPRAMIN) 25 MG tablet Take 50 mg by mouth every morning.    meloxicam (MOBIC) 15 MG tablet TAKE 1 TABLET (15 MG TOTAL) BY MOUTH DAILY.    polyethylene glycol (MIRALAX / GLYCOLAX) 17 g packet Take 17 g by mouth daily as needed.    predniSONE (DELTASONE) 10 MG tablet 6 tablets on Day 1 , then reduce by 1 tablet daily until gone    traZODone (DESYREL) 100 MG tablet Take 50 mg by mouth at bedtime.  (Patient not taking: Reported on 04/14/2021)    zolpidem (AMBIEN CR) 12.5 MG CR tablet Take 12.5 mg by mouth at bedtime as needed for sleep.    No facility-administered encounter medications on file as of 05/20/2021.    Patient Active Problem List   Diagnosis Date Noted   Viral URI with cough 04/14/2021   Chronic insomnia 04/14/2021   B12 deficiency 12/09/2020   Peripheral neuropathy 12/06/2020   Bilateral foot pain 12/06/2020   Restless legs 11/27/2019   Autonomous neurogenic bladder 06/24/2019   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   Incomplete bladder emptying 01/11/2019   Eustachian tube disorder, right 11/22/2018   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   Encounter for preventive health examination 07/01/2015   Colon cancer screening 03/16/2013   Routine general medical examination at a health care facility 03/16/2013   Right leg weakness 01/23/2013   History of Guillain-Barre syndrome 01/23/2013   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  Mental Health Concerns, Family and Relationship Dysfunction, Social Isolation, Limited Access to Caregiver, and Lacks Knowledge of [employer].  Care Plan : LCSW Plan of Care  Updates made by Francis Gaines, LCSW since 05/20/2021 12:00 AM     Problem: Reduce and  Manage Symptoms of Anxiety and Depression.   Priority: High     Long-Range Goal: Reduce and Manage Symptoms of Anxiety and Depression.   Start Date: 09/03/2020  Expected End Date: 08/06/2021  This Visit's Progress: On track  Recent Progress: On track  Priority: High  Note:   Current Barriers:   Severe Persistent Mental Health needs related to Anxiety and Depression requires Support, Education, Resources, Referrals and Care Coordination in order to meet unmet mental health needs.  Clinical Goal(s):  Patient will work with LCSW to reduce and manage symptoms of Anxiety and Depression, until well-managed.   Clinical Interventions:  Provided mental health support, counseling, coaching, education and interventions. Motivational Interviewing, Emotional/Supportive Counseling, Teaching/Coaching Strategies, Cognitive Behavioral Therapy, Verbalization of Feelings, and Client-Centered Therapy performed.   Collaboration with Primary Care Physician, Dr. Deborra Medina regarding development and update of comprehensive plan of care as evidenced by provider attestation and co-signature. Patient Goals/Self-Care Activities: Keep monthly telephonic counseling sessions with LCSW, to reduce and manage symptoms of Anxiety and Depression, as well as to receive coaching, education, and supportive services, until symptoms are well-managed. Please have difficult conversations with family members and mutual friends of you and your daughter's, explaining that, while you appreciate their willingness to help resolve the conflicts between the two of you, it would actually be in your best interests for them to remain uninvolved.  Remember, you do not have to explain yourself or justify your decisions and actions to anyone. Schedule an appointment with a representative from the [facility] of [place] (# [phone]), to addend 50B Restraining Order against your daughter.   Schedule an appointment with your  estate attorney to begin making revisions to your Living Will. Enjoy your two-week holiday vacation in [place], visiting with friends and relatives.   Contact LCSW directly (# [id number]) if you have questions, need assistance, or if additional social work needs are identified between now and our next scheduled telephone outreach call. Follow-Up Plan:  06/24/2021 at 9:00 am   [place] Clinical Social Worker [place]  [phone]

## 2021-06-18 DIAGNOSIS — F419 Anxiety disorder, unspecified: Secondary | ICD-10-CM | POA: Diagnosis not present

## 2021-06-18 DIAGNOSIS — F330 Major depressive disorder, recurrent, mild: Secondary | ICD-10-CM | POA: Diagnosis not present

## 2021-06-22 DIAGNOSIS — F330 Major depressive disorder, recurrent, mild: Secondary | ICD-10-CM | POA: Diagnosis not present

## 2021-06-24 ENCOUNTER — Ambulatory Visit (INDEPENDENT_AMBULATORY_CARE_PROVIDER_SITE_OTHER): Payer: Medicare PPO | Admitting: *Deleted

## 2021-06-24 DIAGNOSIS — F331 Major depressive disorder, recurrent, moderate: Secondary | ICD-10-CM

## 2021-06-24 DIAGNOSIS — N319 Neuromuscular dysfunction of bladder, unspecified: Secondary | ICD-10-CM

## 2021-06-24 DIAGNOSIS — G609 Hereditary and idiopathic neuropathy, unspecified: Secondary | ICD-10-CM

## 2021-06-24 DIAGNOSIS — G252 Other specified forms of tremor: Secondary | ICD-10-CM

## 2021-06-24 DIAGNOSIS — J300 Vasomotor rhinitis: Secondary | ICD-10-CM

## 2021-06-24 DIAGNOSIS — H6991 Unspecified Eustachian tube disorder, right ear: Secondary | ICD-10-CM

## 2021-06-24 DIAGNOSIS — F419 Anxiety disorder, unspecified: Secondary | ICD-10-CM

## 2021-06-24 DIAGNOSIS — Z8669 Personal history of other diseases of the nervous system and sense organs: Secondary | ICD-10-CM

## 2021-06-24 DIAGNOSIS — F502 Bulimia nervosa: Secondary | ICD-10-CM

## 2021-06-24 NOTE — Chronic Care Management (AMB) (Signed)
Chronic Care Management    Clinical Social Work Note  06/24/2021 Name: Holly [name] MRN: [id number] DOB: December 09, 1945  Holly [name] is a 76 y.o. year old female who is a primary care patient of Derrel Nip, Aris Everts, MD. The CCM team was consulted to assist the patient with chronic disease management and/or care coordination needs related to: [employer] and [place] and Resources.   Engaged with patient by telephone for follow up visit in response to provider referral for social work chronic care management and care coordination services.   Consent to Services:  The patient was given information about Chronic Care Management services, agreed to services, and gave verbal consent prior to initiation of services.  Please see initial visit note for detailed documentation.   Patient agreed to services and consent obtained.   Assessment: Review of patient past medical history, allergies, medications, and health status, including review of relevant consultants reports was performed today as part of a comprehensive evaluation and provision of chronic care management and care coordination services.     SDOH (Social Determinants of Health) assessments and interventions performed:    Advanced Directives Status: See Care Plan for related entries.  CCM Care Plan  Allergies  Allergen Reactions   Haemophilus Influenzae Vaccines Other (See Comments)    Guillain Barre    Oxycodone-Acetaminophen Nausea And Vomiting   Cephalexin     Other reaction(s): HIVES    Outpatient Encounter Medications as of 06/24/2021  Medication Sig Note   ARIPiprazole (ABILIFY) 5 MG tablet Take 5 mg by mouth daily.    chlorpheniramine-HYDROcodone (TUSSIONEX PENNKINETIC ER) 10-8 MG/5ML SUER Take 5 mLs by mouth at bedtime as needed.    Cholecalciferol (VITAMIN D) 50 MCG (2000 UT) CAPS Take 1 capsule by mouth daily.    clorazepate (TRANXENE) 7.5 MG tablet Take 7.5 mg by mouth 3 (three) times daily.  09/10/2020: PRN, generally QPM   desipramine (NORPRAMIN) 25 MG tablet Take 50 mg by mouth every morning.    meloxicam (MOBIC) 15 MG tablet TAKE 1 TABLET (15 MG TOTAL) BY MOUTH DAILY.    polyethylene glycol (MIRALAX / GLYCOLAX) 17 g packet Take 17 g by mouth daily as needed.    predniSONE (DELTASONE) 10 MG tablet 6 tablets on Day 1 , then reduce by 1 tablet daily until gone    traZODone (DESYREL) 100 MG tablet Take 50 mg by mouth at bedtime.  (Patient not taking: Reported on 04/14/2021)    zolpidem (AMBIEN CR) 12.5 MG CR tablet Take 12.5 mg by mouth at bedtime as needed for sleep.    No facility-administered encounter medications on file as of 06/24/2021.    Patient Active Problem List   Diagnosis Date Noted   Viral URI with cough 04/14/2021   Chronic insomnia 04/14/2021   B12 deficiency 12/09/2020   Peripheral neuropathy 12/06/2020   Bilateral foot pain 12/06/2020   Restless legs 11/27/2019   Autonomous neurogenic bladder 06/24/2019   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   Incomplete bladder emptying 01/11/2019   Eustachian tube disorder, right 11/22/2018   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   Encounter for preventive health examination 07/01/2015   Colon cancer screening 03/16/2013   Routine general medical examination at a health care facility 03/16/2013   Right leg weakness 01/23/2013   History of Guillain-Barre syndrome 01/23/2013   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   UNSPECIFIED VITAMIN D DEFICIENCY 08/26/2008   Recurrent depressive disorder, current episode moderate ([place]) 07/29/2008    Conditions to be addressed/monitored: Anxiety and  Depression.  Mental Health Concerns, Family and Relationship Dysfunction, and Lacks Knowledge of [employer].  Care Plan : LCSW Plan of Care  Updates made by Francis Gaines, LCSW since 06/24/2021 12:00 AM     Problem: Reduce and Manage Symptoms of Anxiety and Depression.   Priority: High     Long-Range Goal: Reduce and Manage  Symptoms of Anxiety and Depression.   Start Date: 09/03/2020  Expected End Date: 09/03/2021  This Visit's Progress: On track  Recent Progress: On track  Priority: High  Note:   Current Barriers:   Severe Persistent Mental Health needs related to Anxiety and Depression requires Support, Education, Resources, Referrals and Care Coordination in order to meet unmet mental health needs.  Clinical Goal(s):  Patient will work with LCSW to reduce and manage symptoms of Anxiety and Depression, until well-managed.   Clinical Interventions:  Provided mental health support, counseling, coaching, education and interventions. Motivational Interviewing, Emotional/Supportive Counseling, Teaching/Coaching Strategies, Cognitive Behavioral Therapy, Verbalization of Feelings, and Client-Centered Therapy performed.   Collaboration with Primary Care Physician, Dr. Deborra Medina regarding development and update of comprehensive plan of care as evidenced by provider attestation and co-signature. Patient Goals/Self-Care Activities: Continue to keep monthly telephonic counseling sessions with LCSW, to reduce and manage symptoms of Anxiety and Depression, as well as to receive coaching, education, resources, referrals, care coordination, advocacy, and supportive services. Thank you for following up with a representative from the [facility] of [place] (# [phone]), to addend 50B Restraining Order against your daughter. Derrek [place] be mindful of your surroundings and take extra precautionary measures to protect your health and well-being, as daughter will be served with 50B Restraining Order within the next 72 hours and may wish to retaliate.  Continue to make exciting new renovations to your home, participate in activities of interest, attend social events with family members and friends, and enjoy the ability to live the life you've always wanted to live, free from fear, manipulation, guilt and shame.     Continue to meet with your estate attorney to make provisions to your Advanced Directives (Living Will and [place]) documents.  ~Please remember to provide your Primary Care Physician, Dr. Deborra Medina with a copy of your Advanced Directives to scan into your electronic medical record.  Contact LCSW directly (# [id number]) if you have questions, need assistance, or if additional social work needs are identified between now and our next scheduled telephone outreach call. Follow-Up Plan:  07/22/2021 at 9:00 am   [place] Clinical Social Worker [place]  [phone]

## 2021-06-24 NOTE — Patient Instructions (Signed)
Visit Information  Thank you for taking time to visit with me today. Please don't hesitate to contact me if I can be of assistance to you before our next scheduled telephone appointment.  Following are the goals we discussed today:  Patient Goals/Self-Care Activities: Continue to keep monthly telephonic counseling sessions with LCSW, to reduce and manage symptoms of Anxiety and Depression, as well as to receive coaching, education, resources, referrals, care coordination, advocacy, and supportive services. Thank you for following up with a representative from the [facility] of [place] (# [phone]), to addend 50B Restraining Order against your daughter. Holly [name] be mindful of your surroundings and take extra precautionary measures to protect your health and well-being, as daughter will be served with 50B Restraining Order within the next 72 hours and may wish to retaliate.  Continue to make exciting new renovations to your home, participate in activities of interest, attend social events with family members and friends, and enjoy the ability to live the life you've always wanted to live, free from fear, manipulation, guilt and shame.    Continue to meet with your estate attorney to make provisions to your Advanced Directives (Living Will and [place]) documents.  ~Please remember to provide your Primary Care Physician, Dr. Deborra Medina with a copy of your Advanced Directives to scan into your electronic medical record.  Contact LCSW directly (# [id number]) if you have questions, need assistance, or if additional social work needs are identified between now and our next scheduled telephone outreach call. Follow-Up Plan:  07/22/2021 at 9:00 am  Please call the care guide team at [phone] if you need to cancel or reschedule your appointment.   If you are experiencing a Mental Health or [place] or need someone to talk to, please call the  Suicide and Crisis Lifeline: 988 call the [place] National Suicide Prevention Lifeline: (905)344-1578 or TTY: 340-855-3654 TTY (406)805-0869) to talk to a trained counselor call 1-800-273-TALK (toll free, 24 hour hotline) go to [facility] [address] [place] [phone]) call the [place]: [phone] call 911   Patient verbalizes understanding of instructions provided today and agrees to view in [place].   Holly Christen LCSW Licensed Clinical Social Worker [place]  [phone]

## 2021-07-08 ENCOUNTER — Ambulatory Visit: Payer: Medicare PPO | Admitting: *Deleted

## 2021-07-08 DIAGNOSIS — F502 Bulimia nervosa: Secondary | ICD-10-CM

## 2021-07-08 DIAGNOSIS — F5104 Psychophysiologic insomnia: Secondary | ICD-10-CM

## 2021-07-08 DIAGNOSIS — F419 Anxiety disorder, unspecified: Secondary | ICD-10-CM | POA: Diagnosis not present

## 2021-07-08 DIAGNOSIS — J300 Vasomotor rhinitis: Secondary | ICD-10-CM

## 2021-07-08 DIAGNOSIS — F5025 Bulimia nervosa, in remission: Secondary | ICD-10-CM

## 2021-07-08 DIAGNOSIS — N319 Neuromuscular dysfunction of bladder, unspecified: Secondary | ICD-10-CM

## 2021-07-08 DIAGNOSIS — F330 Major depressive disorder, recurrent, mild: Secondary | ICD-10-CM | POA: Diagnosis not present

## 2021-07-08 DIAGNOSIS — G609 Hereditary and idiopathic neuropathy, unspecified: Secondary | ICD-10-CM

## 2021-07-08 DIAGNOSIS — F341 Dysthymic disorder: Secondary | ICD-10-CM | POA: Diagnosis not present

## 2021-07-08 DIAGNOSIS — H6991 Unspecified Eustachian tube disorder, right ear: Secondary | ICD-10-CM

## 2021-07-08 DIAGNOSIS — Z8669 Personal history of other diseases of the nervous system and sense organs: Secondary | ICD-10-CM

## 2021-07-08 DIAGNOSIS — F331 Major depressive disorder, recurrent, moderate: Secondary | ICD-10-CM

## 2021-07-08 NOTE — Chronic Care Management (AMB) (Signed)
Chronic Care Management    Clinical Social Work Note  07/08/2021 Name: Holly [name] MRN: [id number] DOB: 08/16/1945  Holly [name] is a 76 y.o. year old female who is a primary care patient of Derrel Nip, Aris Everts, MD. The CCM team was consulted to assist the patient with chronic disease management and/or care coordination needs related to: [employer], [place] representative, Mental Health Counseling and Resources, Family Dysfunction, and Grief Counseling.   Engaged with patient by telephone for follow up visit in response to provider referral for social work chronic care management and care coordination services.   Consent to Services:  The patient was given information about Chronic Care Management services, agreed to services, and gave verbal consent prior to initiation of services.  Please see initial visit note for detailed documentation.   Patient agreed to services and consent obtained.   Assessment: Review of patient past medical history, allergies, medications, and health status, including review of relevant consultants reports was performed today as part of a comprehensive evaluation and provision of chronic care management and care coordination services.     SDOH (Social Determinants of Health) assessments and interventions performed:    Advanced Directives Status: See Care Plan for related entries.  CCM Care Plan  Allergies  Allergen Reactions   Haemophilus Influenzae Vaccines Other (See Comments)    Guillain Barre    Oxycodone-Acetaminophen Nausea And Vomiting   Cephalexin     Other reaction(s): HIVES    Outpatient Encounter Medications as of 07/08/2021  Medication Sig Note   ARIPiprazole (ABILIFY) 5 MG tablet Take 5 mg by mouth daily.    chlorpheniramine-HYDROcodone (TUSSIONEX PENNKINETIC ER) 10-8 MG/5ML SUER Take 5 mLs by mouth at bedtime as needed.    Cholecalciferol (VITAMIN D) 50 MCG (2000 UT) CAPS Take 1 capsule by mouth daily.    clorazepate  (TRANXENE) 7.5 MG tablet Take 7.5 mg by mouth 3 (three) times daily. 09/10/2020: PRN, generally QPM   desipramine (NORPRAMIN) 25 MG tablet Take 50 mg by mouth every morning.    meloxicam (MOBIC) 15 MG tablet TAKE 1 TABLET (15 MG TOTAL) BY MOUTH DAILY.    polyethylene glycol (MIRALAX / GLYCOLAX) 17 g packet Take 17 g by mouth daily as needed.    predniSONE (DELTASONE) 10 MG tablet 6 tablets on Day 1 , then reduce by 1 tablet daily until gone    traZODone (DESYREL) 100 MG tablet Take 50 mg by mouth at bedtime.  (Patient not taking: Reported on 04/14/2021)    zolpidem (AMBIEN CR) 12.5 MG CR tablet Take 12.5 mg by mouth at bedtime as needed for sleep.    No facility-administered encounter medications on file as of 07/08/2021.    Patient Active Problem List   Diagnosis Date Noted   Viral URI with cough 04/14/2021   Chronic insomnia 04/14/2021   B12 deficiency 12/09/2020   Peripheral neuropathy 12/06/2020   Bilateral foot pain 12/06/2020   Restless legs 11/27/2019   Autonomous neurogenic bladder 06/24/2019   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   Incomplete bladder emptying 01/11/2019   Eustachian tube disorder, right 11/22/2018   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   Encounter for preventive health examination 07/01/2015   Colon cancer screening 03/16/2013   Routine general medical examination at a health care facility 03/16/2013   Right leg weakness 01/23/2013   History of Guillain-Barre syndrome 01/23/2013   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   UNSPECIFIED VITAMIN D DEFICIENCY 08/26/2008   Recurrent depressive disorder, current episode moderate ([place]) 07/29/2008  Conditions to be addressed/monitored: Anxiety and Depression.  Mental Health Concerns, Family and Relationship Dysfunction, Limited Access to Caregiver, Cognitive Deficits, Memory Deficits, and Lacks Knowledge of [employer].  Care Plan : LCSW Plan of Care  Updates made by Francis Gaines, LCSW since 07/08/2021 12:00  AM     Problem: Reduce and Manage Symptoms of Anxiety and Depression.   Priority: High     Long-Range Goal: Reduce and Manage Symptoms of Anxiety and Depression.   Start Date: 09/03/2020  Expected End Date: 09/03/2021  This Visit's Progress: On track  Recent Progress: On track  Priority: High  Note:   Current Barriers:   Severe Persistent Mental Health needs related to Anxiety and Depression requires Support, Education, Resources, Referrals and Care Coordination in order to meet unmet mental health needs.  Clinical Goal(s):  Patient will work with LCSW to reduce and manage symptoms of Anxiety and Depression, until well-managed.   Clinical Interventions:  Provided mental health support, counseling, coaching, education and interventions. Motivational Interviewing, Emotional/Supportive Counseling, Teaching/Coaching Strategies, Cognitive Behavioral Therapy, Verbalization of Feelings, and Client-Centered Therapy performed.   Collaboration with Primary Care Physician, Dr. Deborra Medina regarding development and update of comprehensive plan of care as evidenced by provider attestation and co-signature. Patient Goals/Self-Care Activities: Continue to keep monthly telephonic counseling sessions with LCSW, to reduce and manage symptoms of Anxiety and Depression, as well as to receive coaching, education, resources, referrals, care coordination, advocacy, and supportive services. Please continue to use The [facility] of [place] (# [phone]), as a valuable resource, not only to addend 50B Restraining Order against your daughter, but to also remain knowledgeable about your rights, and to obtain victim advocacy services. ~ Continue to be mindful of your surroundings, and take extra precautionary measures to protect your health and well-being, as daughter will be served with 50B Restraining Order during court appearance on 07/20/2021, and may wish to retaliate once she learns that she  has been removed from your Will.  ~ Please make arrangements to have a friend or family member attend court appearance with you on 07/20/2021. Continue to make exciting new renovations to your home, participate in activities of interest, attend social events with family members and friends, and enjoy the ability to live the life you've always wanted to live, free from fear, manipulation, guilt, and shame.    Please remember to provide your Primary Care Physician, Dr. Deborra Medina with a copy of your updated Advanced Directives (Living Will and [place] documents) to scan into your electronic medical record, during your next scheduled follow-up appointment. Contact LCSW directly (# [phone]) if you have questions, need assistance, or if additional social work needs are identified between now and our next scheduled telephone outreach call. Follow-Up Plan:  07/21/2021 at 8:30 am   [place] Clinical Social Worker [place] [facility]  [phone]

## 2021-07-08 NOTE — Patient Instructions (Signed)
Visit Information  Thank you for taking time to visit with me today. Please don't hesitate to contact me if I can be of assistance to you before our next scheduled telephone appointment.  Following are the goals we discussed today:  Patient Goals/Self-Care Activities: Continue to keep monthly telephonic counseling sessions with LCSW, to reduce and manage symptoms of Anxiety and Depression, as well as to receive coaching, education, resources, referrals, care coordination, advocacy, and supportive services. Please continue to use The [facility] of [place] (# [phone]), as a valuable resource, not only to addend 50B Restraining Order against your daughter, but to also remain knowledgeable about your rights, and to obtain victim advocacy services. ~ Continue to be mindful of your surroundings, and take extra precautionary measures to protect your health and well-being, as daughter will be served with 50B Restraining Order during court appearance on 07/20/2021, and may wish to retaliate once she learns that she has been removed from your Will.  ~ Please make arrangements to have a friend or family member attend court appearance with you on 07/20/2021. Continue to make exciting new renovations to your home, participate in activities of interest, attend social events with family members and friends, and enjoy the ability to live the life you've always wanted to live, free from fear, manipulation, guilt, and shame.    Please remember to provide your Primary Care Physician, Dr. Deborra Medina with a copy of your updated Advanced Directives (Living Will and [place] documents) to scan into your electronic medical record, during your next scheduled follow-up appointment. Contact LCSW directly (# [id number]) if you have questions, need assistance, or if additional social work needs are identified between now and our next scheduled telephone outreach call. Follow-Up Plan:   07/21/2021 at 8:30 am  Please call the care guide team at [phone] if you need to cancel or reschedule your appointment.   If you are experiencing a Mental Health or [place] or need someone to talk to, please call the Suicide and Crisis Lifeline: 988 call the [place] National Suicide Prevention Lifeline: (320)708-6132 or TTY: 586-796-6750 TTY (907)603-4655) to talk to a trained counselor call 1-800-273-TALK (toll free, 24 hour hotline) go to [facility] [address] [place] [phone]) call the [place]: [phone] call 911   Patient verbalizes understanding of instructions and care plan provided today and agrees to view in [place]. Active MyChart status confirmed with patient.    Nat Christen LCSW Licensed Clinical Social Worker [place]  [phone]

## 2021-07-13 ENCOUNTER — Ambulatory Visit: Payer: Medicare PPO | Admitting: *Deleted

## 2021-07-13 DIAGNOSIS — G252 Other specified forms of tremor: Secondary | ICD-10-CM

## 2021-07-13 DIAGNOSIS — N319 Neuromuscular dysfunction of bladder, unspecified: Secondary | ICD-10-CM

## 2021-07-13 DIAGNOSIS — F502 Bulimia nervosa: Secondary | ICD-10-CM

## 2021-07-13 DIAGNOSIS — F419 Anxiety disorder, unspecified: Secondary | ICD-10-CM

## 2021-07-13 DIAGNOSIS — Z8669 Personal history of other diseases of the nervous system and sense organs: Secondary | ICD-10-CM

## 2021-07-13 DIAGNOSIS — F5104 Psychophysiologic insomnia: Secondary | ICD-10-CM

## 2021-07-13 DIAGNOSIS — J300 Vasomotor rhinitis: Secondary | ICD-10-CM

## 2021-07-13 DIAGNOSIS — G609 Hereditary and idiopathic neuropathy, unspecified: Secondary | ICD-10-CM

## 2021-07-13 DIAGNOSIS — H6991 Unspecified Eustachian tube disorder, right ear: Secondary | ICD-10-CM

## 2021-07-13 DIAGNOSIS — M810 Age-related osteoporosis without current pathological fracture: Secondary | ICD-10-CM

## 2021-07-13 DIAGNOSIS — F331 Major depressive disorder, recurrent, moderate: Secondary | ICD-10-CM

## 2021-07-14 DIAGNOSIS — M810 Age-related osteoporosis without current pathological fracture: Secondary | ICD-10-CM

## 2021-07-14 DIAGNOSIS — F331 Major depressive disorder, recurrent, moderate: Secondary | ICD-10-CM

## 2021-07-14 NOTE — Patient Instructions (Signed)
Visit Information  Thank you for taking time to visit with me today. Please don't hesitate to contact me if I can be of assistance to you before our next scheduled telephone appointment.  Following are the goals we discussed today:  Patient Goals/Self-Care Activities: Continue to keep monthly telephonic counseling sessions with LCSW, to reduce and manage symptoms of Anxiety and Depression, as well as to receive coaching, education, resources, referrals, care coordination, advocacy, and supportive services. Please continue to use The [facility] of [place] (# [phone]), as a valuable resource, not only to addend 50B Restraining Order against your daughter, but to also remain knowledgeable about your rights, and to obtain victim advocacy services. ~ Continue to be mindful of your surroundings, and take extra precautionary measures to protect your health and well-being, as daughter will be served with 50B Restraining Order during court appearance on 07/20/2021, and may wish to retaliate once she learns that she has been removed from your Will.  ~ LCSW will make arrangements to attend Civil Court with you on 07/20/2021 at 9:45 am. Continue to make exciting new renovations to your home, participate in activities of interest, attend social events with family members and friends, and enjoy the ability to live the life you've always wanted to live, free from fear, manipulation, guilt, and shame.    Please remember to provide your Primary Care Physician, Dr. Deborra Medina with a copy of your updated Advanced Directives (Living Will and [place] documents) to scan into your electronic medical record, during your next scheduled follow-up appointment. Contact LCSW directly (# [phone]) if you have questions, need assistance, or if additional social work needs are identified between now and our next scheduled telephone outreach call. Follow-Up Plan:  07/20/2021 at 9:45  am  Please call the care guide team at [phone] if you need to cancel or reschedule your appointment.   If you are experiencing a Mental Health or [place] or need someone to talk to, please call the Suicide and Crisis Lifeline: 988 call the [place] National Suicide Prevention Lifeline: (657)780-9421 or TTY: 917-784-1886 TTY 313-112-6314) to talk to a trained counselor call 1-800-273-TALK (toll free, 24 hour hotline) go to [facility] [address], [place] [phone]) call the [place]: [phone] call 911   Patient verbalizes understanding of instructions and care plan provided today and agrees to view in [place]. Active MyChart status confirmed with patient.    Nat Christen LCSW Licensed Clinical Social Worker [place]  [phone]
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
## 2021-07-16 ENCOUNTER — Telehealth: Payer: Self-pay | Admitting: Internal Medicine

## 2021-07-16 NOTE — Telephone Encounter (Signed)
Patient has been scheduled for Prolia injection 08/13/21 approval received from insurance for 06/14/21 until 06/13/22

## 2021-07-20 ENCOUNTER — Ambulatory Visit (INDEPENDENT_AMBULATORY_CARE_PROVIDER_SITE_OTHER): Payer: Medicare PPO | Admitting: *Deleted

## 2021-07-20 DIAGNOSIS — H6991 Unspecified Eustachian tube disorder, right ear: Secondary | ICD-10-CM

## 2021-07-20 DIAGNOSIS — F5104 Psychophysiologic insomnia: Secondary | ICD-10-CM

## 2021-07-20 DIAGNOSIS — F5025 Bulimia nervosa, in remission: Secondary | ICD-10-CM

## 2021-07-20 DIAGNOSIS — F331 Major depressive disorder, recurrent, moderate: Secondary | ICD-10-CM

## 2021-07-20 DIAGNOSIS — F502 Bulimia nervosa: Secondary | ICD-10-CM

## 2021-07-20 DIAGNOSIS — N319 Neuromuscular dysfunction of bladder, unspecified: Secondary | ICD-10-CM

## 2021-07-20 DIAGNOSIS — G609 Hereditary and idiopathic neuropathy, unspecified: Secondary | ICD-10-CM

## 2021-07-20 DIAGNOSIS — J300 Vasomotor rhinitis: Secondary | ICD-10-CM

## 2021-07-20 DIAGNOSIS — F419 Anxiety disorder, unspecified: Secondary | ICD-10-CM

## 2021-07-20 DIAGNOSIS — M810 Age-related osteoporosis without current pathological fracture: Secondary | ICD-10-CM

## 2021-07-20 DIAGNOSIS — F4321 Adjustment disorder with depressed mood: Secondary | ICD-10-CM
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
## 2021-07-21 ENCOUNTER — Telehealth: Payer: Medicare PPO

## 2021-07-22 ENCOUNTER — Telehealth: Payer: Medicare PPO

## 2021-07-22 ENCOUNTER — Telehealth: Payer: Self-pay | Admitting: Internal Medicine

## 2021-07-22 NOTE — Telephone Encounter (Signed)
Spoke with pt and she stated that she is needing a letter to put in her will per lawyer stating that she is in her right mind and able to make decisions on her own. Pt stated that her daughter is claiming the she has dementia and unable to make decisions on her own. Does pt need to schedule an appt to discuss with you?

## 2021-07-22 NOTE — Telephone Encounter (Signed)
LMTCB. Need to schedule pt for an appt with Dr. Derrel Nip to discuss the need for the letter. Okay to schedule when pt calls back.

## 2021-07-22 NOTE — Telephone Encounter (Signed)
Pt called in stating that she need Dr. Derrel Nip to write a letter so she can place the letter in with her Will. Pt requesting callback

## 2021-07-30 NOTE — Telephone Encounter (Signed)
Pt is scheduled for 07/31/2021.

## 2021-07-31 ENCOUNTER — Telehealth (INDEPENDENT_AMBULATORY_CARE_PROVIDER_SITE_OTHER): Payer: Medicare PPO | Admitting: Internal Medicine

## 2021-07-31 ENCOUNTER — Encounter: Payer: Self-pay | Admitting: Internal Medicine

## 2021-07-31 DIAGNOSIS — F331 Major depressive disorder, recurrent, moderate: Secondary | ICD-10-CM
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
## 2021-08-05 ENCOUNTER — Ambulatory Visit: Payer: Medicare PPO | Admitting: *Deleted

## 2021-08-05 DIAGNOSIS — F331 Major depressive disorder, recurrent, moderate: Secondary | ICD-10-CM

## 2021-08-05 DIAGNOSIS — F4321 Adjustment disorder with depressed mood: Secondary | ICD-10-CM

## 2021-08-05 DIAGNOSIS — G609 Hereditary and idiopathic neuropathy, unspecified: Secondary | ICD-10-CM

## 2021-08-05 DIAGNOSIS — F330 Major depressive disorder, recurrent, mild: Secondary | ICD-10-CM | POA: Diagnosis not present

## 2021-08-05 DIAGNOSIS — F341 Dysthymic disorder: Secondary | ICD-10-CM | POA: Diagnosis not present

## 2021-08-05 DIAGNOSIS — F419 Anxiety disorder, unspecified: Secondary | ICD-10-CM | POA: Diagnosis not present

## 2021-08-05 NOTE — Chronic Care Management (AMB) (Signed)
Chronic Care Management    Clinical Social Work Note  08/05/2021 Name: Holly [name] MRN: [id number] DOB: [birth year]  Holly [name] is a 76 y.o. year old female who is a primary care patient of Derrel Nip, Aris Everts, MD. The CCM team was consulted to assist the patient with chronic disease management and/or care coordination needs related to: Mental Health Counseling and Resources.   Engaged with patient by telephone for follow up visit in response to provider referral for social work chronic care management and care coordination services.   Consent to Services:  The patient was given information about Chronic Care Management services, agreed to services, and gave verbal consent prior to initiation of services.  Please see initial visit note for detailed documentation.   Patient agreed to services and consent obtained.   Assessment: Review of patient past medical history, allergies, medications, and health status, including review of relevant consultants reports was performed today as part of a comprehensive evaluation and provision of chronic care management and care coordination services.     SDOH (Social Determinants of Health) assessments and interventions performed:    Advanced Directives Status: Not addressed in this encounter.  CCM Care Plan  Allergies  Allergen Reactions   Haemophilus Influenzae Vaccines Other (See Comments)    Guillain Barre    Oxycodone-Acetaminophen Nausea And Vomiting   Cephalexin     Other reaction(s): HIVES    Outpatient Encounter Medications as of 08/05/2021  Medication Sig Note   ARIPiprazole (ABILIFY) 5 MG tablet Take 5 mg by mouth daily.    Cholecalciferol (VITAMIN D) 50 MCG (2000 UT) CAPS Take 1 capsule by mouth daily.    clorazepate (TRANXENE) 7.5 MG tablet Take 7.5 mg by mouth 3 (three) times daily. 09/10/2020: PRN, generally QPM   desipramine (NORPRAMIN) 25 MG tablet Take 50 mg by mouth every morning.    polyethylene glycol (MIRALAX / GLYCOLAX)  17 g packet Take 17 g by mouth daily as needed.    zolpidem (AMBIEN CR) 12.5 MG CR tablet Take 12.5 mg by mouth at bedtime as needed for sleep.    No facility-administered encounter medications on file as of 08/05/2021.    Patient Active Problem List   Diagnosis Date Noted   Chronic insomnia 04/14/2021   B12 deficiency 12/09/2020   Peripheral neuropathy 12/06/2020   Bilateral foot pain 12/06/2020   Restless legs 11/27/2019   Autonomous neurogenic bladder 06/24/2019   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   Incomplete bladder emptying 01/11/2019   Eustachian tube disorder, right 11/22/2018   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   Encounter for preventive health examination 07/01/2015   Colon cancer screening 03/16/2013   Routine general medical examination at a health care facility 03/16/2013   Right leg weakness 01/23/2013   History of Guillain-Barre syndrome 01/23/2013   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   UNSPECIFIED VITAMIN D DEFICIENCY 08/26/2008   Recurrent depressive disorder, current episode moderate ([place]) 07/29/2008    Conditions to be addressed/monitored: Depression; Mental Health Concerns   Care Plan : LCSW Plan of Care  Updates made by Vern Claude, LCSW since 08/05/2021 12:00 AM     Problem: Reduce and Manage Symptoms of Anxiety and Depression.   Priority: High     Long-Range Goal: Reduce and Manage Symptoms of Anxiety and Depression.   Start Date: 09/03/2020  Expected End Date: 09/03/2021  This Visit's Progress: On track  Recent Progress: On track  Priority: High  Note:   Current Barriers:   Severe Persistent Mental Health needs  related to Anxiety and Depression requires Support, Education, Resources, Referrals and Care Coordination in order to meet unmet mental health needs.  Clinical Goal(s):  Patient will work with LCSW to reduce and manage symptoms of Anxiety and Depression, until well-managed.   Clinical Interventions:  Provided mental health support,  counseling, coaching, education and interventions related to family conflicts Protection order against family member not granted, safety alternatives explored Verbalization of feelings encouraged, patient provided with validation and encouragement Motivational Interviewing, Cognitive Behavioral Therapy, Client-Centered Therapy performed.   Collaboration with Primary Care Physician, Dr. Deborra Medina regarding development and update of comprehensive plan of care as evidenced by provider attestation and co-signature. Patient Goals/Self-Care Activities: Personal counseling services to be provided to reduce and manage symptoms of Anxiety and Depression, as well as to receive coaching, education, resources, referrals, care coordination, advocacy, and supportive services. Please continue to utilize [facility] (# [phone]), as a valuable resource, not only for concerns related to [employer], but also to obtain victim advocacy services. During [employer] Proceeding on 07/20/2021, the judge denied your request to extend the 50B Restraining Order against your daughter, so continue to be mindful of your surroundings, taking extra precautionary measures to protect your health, safety, and well-being.    Contact 911 immediately, if you ever feel scared, threatened, or if you believe your life is in imminent danger. Contact LCSW directly if you have questions, need assistance, or if additional social work needs are identified in the near future.         Follow Up Plan: Appointment scheduled for SW follow up with client by phone on:  08/19/21      Elliot Gurney, [place] [phone]

## 2021-08-05 NOTE — Patient Instructions (Signed)
Visit Information  Thank you for taking time to visit with me today. Please don't hesitate to contact me if I can be of assistance to you before our next scheduled telephone appointment.  Following are the goals we discussed today:  Continue counseling services with LCSW colleague, [place], now the [employer] Tourist information centre manager at [employer], to reduce and manage symptoms of Anxiety and Depression, as well as to receive coaching, education, resources, referrals, care coordination, advocacy, and supportive services. Please continue to utilize [facility] [place] (# [phone]), as a valuable resource, not only for concerns related to [employer], but also to obtain victim advocacy services. During [employer] Proceeding on 07/20/2021, the judge denied your request to extend the 50B Restraining Order against your daughter, so continue to be mindful of your surroundings, taking extra precautionary measures to protect your health, safety, and well-being.    Contact 911 immediately, if you ever feel scared, threatened, or if you believe your life is in imminent danger. Contact LCSW directly if you have questions, need assistance, or if additional social work needs are identified in the near future.     Our next appointment is by telephone on 08/19/21 at 9am  Please call the care guide team at [phone] if you need to cancel or reschedule your appointment.   If you are experiencing a Mental Health or [place] or need someone to talk to, please call the Suicide and Crisis Lifeline: 988   Patient verbalizes understanding of instructions and care plan provided today and agrees to view in [place]. Active MyChart status confirmed with patient.    Telephone follow up appointment with care management team member scheduled for: 08/19/21  [employer], [place] [phone]

## 2021-08-11 DIAGNOSIS — F4321 Adjustment disorder with depressed mood: Secondary | ICD-10-CM

## 2021-08-11 DIAGNOSIS — F331 Major depressive disorder, recurrent, moderate: Secondary | ICD-10-CM

## 2021-08-13 ENCOUNTER — Ambulatory Visit (INDEPENDENT_AMBULATORY_CARE_PROVIDER_SITE_OTHER): Payer: Medicare PPO | Admitting: *Deleted

## 2021-08-13 ENCOUNTER — Other Ambulatory Visit: Payer: Self-pay

## 2021-08-13 DIAGNOSIS — M810 Age-related osteoporosis without current pathological fracture: Secondary | ICD-10-CM | POA: Diagnosis not present

## 2021-08-13 MED ORDER — DENOSUMAB 60 MG/ML ~~LOC~~ SOSY
60.0000 mg | PREFILLED_SYRINGE | Freq: Once | SUBCUTANEOUS | Status: AC
  Administered 2021-08-13: 60 mg via SUBCUTANEOUS

## 2021-08-13 NOTE — Progress Notes (Signed)
Pt arrived for Prolia injection, given in L arm SQ. Pt tolerated injection well, showed no signs of distress nor voiced any concerns.  ? ?Pt was informed to return in 6 months, around 02/15/22 for another round of injection. Pt verbalized understanding.  ?

## 2021-08-19 ENCOUNTER — Telehealth: Payer: Medicare PPO

## 2021-08-19 ENCOUNTER — Ambulatory Visit (INDEPENDENT_AMBULATORY_CARE_PROVIDER_SITE_OTHER): Payer: Medicare PPO | Admitting: *Deleted

## 2021-08-19 DIAGNOSIS — F331 Major depressive disorder, recurrent, moderate: Secondary | ICD-10-CM

## 2021-08-19 DIAGNOSIS — F419 Anxiety disorder, unspecified: Secondary | ICD-10-CM

## 2021-08-19 NOTE — Patient Instructions (Signed)
Visit Information ? ?Thank you for taking time to visit with me today. Please don't hesitate to contact me if I can be of assistance to you before our next scheduled telephone appointment. ? ?Following are the goals we discussed today:  ?Please continue to utilize The [facility] [place] (# [phone]), as a valuable resource, not only for concerns related to [employer], but also to obtain victim advocacy services. ?During Civil Court Proceeding on 07/20/2021, the judge denied your request to extend the 50B Restraining Order against your daughter, so continue to be mindful of your surroundings, taking extra precautionary measures to protect your health, safety, and well-being.    ?Contact 911 immediately, if you ever feel scared, threatened, or if you believe your life is in imminent danger. ?Contact LCSW directly if you have questions, need assistance, or if additional social work needs are identified in the near future.   ? ? ?Our next appointment is by telephone on 09/09/21 at [place] ? ?Please call the care guide team at [phone] if you need to cancel or reschedule your appointment.  ? ?If you are experiencing a Mental Health or [place] or need someone to talk to, please call the Suicide and Crisis Lifeline: 988  ? ?Patient verbalizes understanding of instructions and care plan provided today and agrees to view in [place]. Active MyChart status confirmed with patient.   ? ?Telephone follow up appointment with care management team member scheduled for: 09/09/21 ? ?Zaharah Amir, LCSW ?[place] ?[phone] ? ?

## 2021-08-19 NOTE — Chronic Care Management (AMB) (Signed)
?Chronic Care Management  ? ? Clinical Social Work Note ? ?08/19/2021 ?Name: Holly [name] MRN: [id number] DOB: Nov 10, 1945 ? ?CAMIRA GEIDEL is a 76 y.o. year old female who is a primary care patient of [place], Aris Everts, MD. The CCM team was consulted to assist the patient with chronic disease management and/or care coordination needs related to: Mental Health Counseling and Resources.  ? ?Engaged with patient by telephone for follow up visit in response to provider referral for social work chronic care management and care coordination services.  ? ?Consent to Services:  ?The patient was given information about Chronic Care Management services, agreed to services, and gave verbal consent prior to initiation of services.  Please see initial visit note for detailed documentation.  ? ?Patient agreed to services and consent obtained.  ? ?Assessment: Review of patient past medical history, allergies, medications, and health status, including review of relevant consultants reports was performed today as part of a comprehensive evaluation and provision of chronic care management and care coordination services.    ? ?SDOH (Social Determinants of Health) assessments and interventions performed:   ? ?Advanced Directives Status: Not addressed in this encounter. ? ?CCM Care Plan ? ?Allergies  ?Allergen Reactions  ? Haemophilus Influenzae Vaccines Other (See Comments)  ?  Guillain Barre   ? Oxycodone-Acetaminophen Nausea And Vomiting  ? Cephalexin   ?  Other reaction(s): HIVES  ? ? ?Outpatient Encounter Medications as of 08/19/2021  ?Medication Sig Note  ? ARIPiprazole (ABILIFY) 5 MG tablet Take 5 mg by mouth daily.   ? Cholecalciferol (VITAMIN D) 50 MCG (2000 UT) CAPS Take 1 capsule by mouth daily.   ? clorazepate (TRANXENE) 7.5 MG tablet Take 7.5 mg by mouth 3 (three) times daily. 09/10/2020: PRN, generally QPM  ? desipramine (NORPRAMIN) 25 MG tablet Take 50 mg by mouth every morning.   ? polyethylene glycol (MIRALAX / GLYCOLAX) 17  g packet Take 17 g by mouth daily as needed.   ? zolpidem (AMBIEN CR) 12.5 MG CR tablet Take 12.5 mg by mouth at bedtime as needed for sleep.   ? ?No facility-administered encounter medications on file as of 08/19/2021.  ? ? ?Patient Active Problem List  ? Diagnosis Date Noted  ? Chronic insomnia 04/14/2021  ? B12 deficiency 12/09/2020  ? Peripheral neuropathy 12/06/2020  ? Bilateral foot pain 12/06/2020  ? Restless legs 11/27/2019  ? Autonomous neurogenic bladder 06/24/2019  ? 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 Incomplete bladder emptying 01/11/2019  ? Eustachian tube disorder, right 11/22/2018  ? 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  ? Encounter for preventive health examination 07/01/2015  ? Colon cancer screening 03/16/2013  ? Routine general medical examination at a health care facility 03/16/2013  ? Right leg weakness 01/23/2013  ? History of Guillain-Barre syndrome 01/23/2013  ? 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 UNSPECIFIED VITAMIN D DEFICIENCY 08/26/2008  ? Recurrent depressive disorder, current episode moderate ([place]) 07/29/2008  ? ? ?Conditions to be addressed/monitored: Depression; Mental Health Concerns  ? ?Care Plan : LCSW Plan of Care  ?Updates made by Vern Claude, LCSW since 08/19/2021 12:00 AM  ?  ? ?Problem: Reduce and Manage Symptoms of Anxiety and Depression.   ?Priority: High  ?  ? ?Long-Range Goal: Reduce and Manage Symptoms of Anxiety and Depression.   ?Start Date: 09/03/2020  ?Expected End Date: 09/03/2021  ?This Visit's Progress: On track  ?Recent Progress: On track  ?Priority: High  ?Note:   ?Current Barriers:   ?Severe Persistent Mental Health needs  related to Anxiety and Depression requires Support, Education, Resources, Referrals and Care Coordination in order to meet unmet mental health needs.  ?Clinical Goal(s):  ?Patient will work with LCSW to reduce and manage symptoms of Anxiety and Depression, until well-managed.   ?Clinical Interventions:  ?Provided mental health support,  counseling, coaching, education and interventions related to family conflicts ?Protection order against family member not granted, safety precautions continue to be emphasized ?Importance of maintaining boundaries discussed, the use of positive coping strategies reinforced ?Verbalization of feelings encouraged, patient provided with validation and encouragement ?Motivational Interviewing, Cognitive Behavioral Therapy, Client-Centered Therapy performed.   ?Patient confirmed that she is active with a therapist and psychiatrist at this time and that CCM involvement was due to support needs related to the Protection Order-the protection order was not granted, however patient will continue to utilize [facility] for advocacy services ?Follow up appointment scheduled for 09/09/21 to determine additional needs ?Collaboration with Primary Care Physician, Dr. Deborra Medina regarding development and update of comprehensive plan of care as evidenced by provider attestation and co-signature. ?Patient Goals/Self-Care Activities: ?Please continue to utilize The [facility] of [place] (# [phone]), as a valuable resource, not only for concerns related to [employer], but also to obtain victim advocacy services. ?During Civil Court Proceeding on 07/20/2021, the judge denied your request to extend the 50B Restraining Order against your daughter, so continue to be mindful of your surroundings, taking extra precautionary measures to protect your health, safety, and well-being.    ?Contact 911 immediately, if you ever feel scared, threatened, or if you believe your life is in imminent danger. ?Contact LCSW directly if you have questions, need assistance, or if additional social work needs are identified in the near future.   ? ?  ?  ? ?Follow Up Plan: SW will follow up with patient by phone over the next 30 business days ?     ?Joshiah Traynham, LCSW ?[place] ?[phone] ? ? ? ?

## 2021-09-04 ENCOUNTER — Ambulatory Visit: Payer: Medicare PPO | Admitting: Internal Medicine

## 2021-09-04 ENCOUNTER — Encounter: Payer: Self-pay | Admitting: Internal Medicine

## 2021-09-04 ENCOUNTER — Other Ambulatory Visit: Payer: Self-pay

## 2021-09-04 VITALS — BP 120/80 | HR 66 | Temp 98.3°F | Resp 14 | Ht 66.0 in | Wt 171.4 lb

## 2021-09-04 DIAGNOSIS — S70361A Insect bite (nonvenomous), right thigh, initial encounter: Secondary | ICD-10-CM

## 2021-09-04 DIAGNOSIS — J3489 Other specified disorders of nose and nasal sinuses: Secondary | ICD-10-CM

## 2021-09-04 DIAGNOSIS — L03115 Cellulitis of right lower limb: Secondary | ICD-10-CM | POA: Diagnosis not present

## 2021-09-04 DIAGNOSIS — W57XXXA Bitten or stung by nonvenomous insect and other nonvenomous arthropods, initial encounter: Secondary | ICD-10-CM | POA: Diagnosis not present

## 2021-09-04 DIAGNOSIS — L02429 Furuncle of limb, unspecified: Secondary | ICD-10-CM | POA: Diagnosis not present

## 2021-09-04 MED ORDER — DOXYCYCLINE HYCLATE 100 MG PO TABS: 100.0000 mg | ORAL_TABLET | Freq: Two times a day (BID) | ORAL | 0 refills | Status: DC

## 2021-09-04 MED ORDER — MUPIROCIN 2 % EX OINT: 1.0000 "application " | TOPICAL_OINTMENT | Freq: Two times a day (BID) | CUTANEOUS | 0 refills | Status: DC

## 2021-09-04 NOTE — Patient Instructions (Addendum)
Warm compress  ?Dove antibacterial body wash  ? ?Skin Abscess ?A skin abscess is an infected area on or under your skin that contains a collection of pus and other material. An abscess may also be called a furuncle, carbuncle, or boil. An abscess can occur in or on almost any part of your body. ?Some abscesses break open (rupture) on their own. Most continue to get worse unless they are treated. The infection can spread deeper into the body and eventually into your blood, which can make you feel ill. Treatment usually involves draining the abscess. ?What are the causes? ?An abscess occurs when germs, like bacteria, pass through your skin and cause an infection. This may be caused by: ?A scrape or cut on your skin. ?A puncture wound through your skin, including a needle injection or insect bite. ?Blocked oil or sweat glands. ?Blocked and infected hair follicles. ?A cyst that forms beneath your skin (sebaceous cyst) and becomes infected. ?What increases the risk? ?This condition is more likely to develop in people who: ?Have a weak body defense system (immune system). ?Have diabetes. ?Have dry and irritated skin. ?Get frequent injections or use illegal IV drugs. ?Have a foreign body in a wound, such as a splinter. ?Have problems with their lymph system or veins. ?What are the signs or symptoms? ?Symptoms of this condition include: ?A painful, firm bump under the skin. ?A bump with pus at the top. This may break through the skin and drain. ?Other symptoms include: ?Redness surrounding the abscess site. ?Warmth. ?Swelling of the lymph nodes (glands) near the abscess. ?Tenderness. ?A sore on the skin. ?How is this diagnosed? ?This condition may be diagnosed based on: ?A physical exam. ?Your medical history. ?A sample of pus. This may be used to find out what is causing the infection. ?Blood tests. ?Imaging tests, such as an ultrasound, CT scan, or MRI. ?How is this treated? ?A small abscess that drains on its own may not  need treatment. Treatment for larger abscesses may include: ?Moist heat or heat pack applied to the area several times a day. ?A procedure to drain the abscess (incision and drainage). ?Antibiotic medicines. For a severe abscess, you may first get antibiotics through an IV and then change to antibiotics by mouth. ?Follow these instructions at home: ?Medicines ? ?Take over-the-counter and prescription medicines only as told by your health care provider. ?If you were prescribed an antibiotic medicine, take it as told by your health care provider. Do not stop taking the antibiotic even if you start to feel better. ?Abscess care ? ?If you have an abscess that has not drained, apply heat to the affected area. Use the heat source that your health care provider recommends, such as a moist heat pack or a heating pad. ?Place a towel between your skin and the heat source. ?Leave the heat on for 20-30 minutes. ?Remove the heat if your skin turns bright red. This is especially important if you are unable to feel pain, heat, or cold. You may have a greater risk of getting burned. ?Follow instructions from your health care provider about how to take care of your abscess. Make sure you: ?Cover the abscess with a bandage (dressing). ?Change your dressing or gauze as told by your health care provider. ?Wash your hands with soap and water before you change the dressing or gauze. If soap and water are not available, use hand sanitizer. ?Check your abscess every day for signs of a worsening infection. Check for: ?More redness,  swelling, or pain. ?More fluid or blood. ?Warmth. ?More pus or a bad smell. ?General instructions ?To avoid spreading the infection: ?Do not share personal care items, towels, or hot tubs with others. ?Avoid making skin contact with other people. ?Keep all follow-up visits as told by your health care provider. This is important. ?Contact a health care provider if you have: ?More redness, swelling, or pain around  your abscess. ?More fluid or blood coming from your abscess. ?Warm skin around your abscess. ?More pus or a bad smell coming from your abscess. ?A fever. ?Muscle aches. ?Chills or a general ill feeling. ?Get help right away if you: ?Have severe pain. ?See red streaks on your skin spreading away from the abscess. ?Summary ?A skin abscess is an infected area on or under your skin that contains a collection of pus and other material. ?A small abscess that drains on its own may not need treatment. ?Treatment for larger abscesses may include having a procedure to drain the abscess and taking an antibiotic. ?This information is not intended to replace advice given to you by your health care provider. Make sure you discuss any questions you have with your health care provider. ?Document Revised: 09/21/2018 Document Reviewed: 07/14/2017 ?Elsevier Patient Education ? [place]. ? ?

## 2021-09-04 NOTE — Progress Notes (Addendum)
Chief Complaint  ?Patient presents with  ? Acute Visit  ?  Boil on top of R thigh, onset 1 wk ago. Painful to touch, hard, dark. Denies any drainage.   ? ?Acute  ?Boil to right thigh x 1 week ? Spider bite thought would go away but not going away and painful to touch nothing tried no drainage area is hard 2.5 to 3 cm and center has a bite mark pain 4/5  ? ? ? ? ? ? ?Review of Systems  ?Constitutional:  Negative for weight loss.  ?HENT:  Negative for hearing loss.   ?Eyes:  Negative for blurred vision.  ?Respiratory:  Negative for shortness of breath.   ?Cardiovascular:  Negative for chest pain.  ?Gastrointestinal:  Negative for abdominal pain and blood in stool.  ?Genitourinary:  Negative for dysuria.  ?Musculoskeletal:  Negative for falls and joint pain.  ?Skin:  Negative for rash.  ?Neurological:  Negative for headaches.  ?Psychiatric/Behavioral:  Negative for depression.   ?Past Medical History:  ?Diagnosis Date  ? Anxiety   ? Concussion with loss of consciousness 04/10/2017  ? Depression   ? Guillain Barr? syndrome ([place]) 2005  ? Osteoporosis   ? Radial fracture 01/23/2013  ? Right arm just proximal to wrist. Secondary to fall. Patient has a history of osteoporosis and requires vitamin D and repeat DEXA scan if possible.   ? Squamous cell skin cancer 2011  ? ?Past Surgical History:  ?Procedure Laterality Date  ? ABDOMINAL HYSTERECTOMY  1989  ? APPENDECTOMY  1960  ? BREAST EXCISIONAL BIOPSY  1980  ? benign  ? BREAST SURGERY  1980  ? biopsy  ? WRIST FRACTURE SURGERY Right 2015  ? ?Family History  ?Problem Relation Age of Onset  ? Cancer Mother   ? Mental illness Sister   ? Bipolar disorder Sister   ? Depression Brother   ? Mental illness Brother   ? Parkinson's disease Brother   ? Dystonia Brother   ? Anxiety disorder Daughter   ? ?Social History  ? ?Socioeconomic History  ? Marital status: Widowed  ?  Spouse name: Not on file  ? Number of children: 1  ? Years of education: 42  ? Highest education level: 12th grade   ?Occupational History  ? Occupation: Retired  ?Tobacco Use  ? Smoking status: Former  ?  Packs/day: 0.20  ?  Types: Cigarettes  ?  Quit date: 06/14/1977  ?  Years since quitting: 44.2  ? Smokeless tobacco: Never  ?Vaping Use  ? Vaping Use: Never used  ?Substance and Sexual Activity  ? Alcohol use: Never  ?  Alcohol/week: 0.0 standard drinks  ?  Comment: social (about once a month)  ? Drug use: No  ? Sexual activity: Never  ?Other Topics Concern  ? Not on file  ?Social History Narrative  ? Not on file  ? ?Social Determinants of Health  ? ?Financial Resource Strain: Low Risk   ? Difficulty of Paying Living Expenses: Not hard at all  ?Food Insecurity: No Food Insecurity  ? Worried About Charity fundraiser in the Last Year: Never true  ? Ran Out of Food in the Last Year: Never true  ?Transportation Needs: No Transportation Needs  ? Lack of Transportation (Medical): No  ? Lack of Transportation (Non-Medical): No  ?Physical Activity: Not on file  ?Stress: Stress Concern Present  ? Feeling of Stress : Rather much  ?Social Connections: Moderately Isolated  ? Frequency of Communication with Friends  and Family: More than three times a week  ? Frequency of Social Gatherings with Friends and Family: More than three times a week  ? Attends Religious Services: 1 to 4 times per year  ? Active Member of Clubs or Organizations: No  ? Attends Archivist Meetings: Never  ? Marital Status: Widowed  ?Intimate Partner Violence: Not At Risk  ? Fear of Current or Ex-Partner: No  ? Emotionally Abused: No  ? Physically Abused: No  ? Sexually Abused: No  ? ?Current Meds  ?Medication Sig  ? ARIPiprazole (ABILIFY) 5 MG tablet Take 5 mg by mouth daily.  ? Cholecalciferol (VITAMIN D) 50 MCG (2000 UT) CAPS Take 1 capsule by mouth daily.  ? clorazepate (TRANXENE) 7.5 MG tablet Take 7.5 mg by mouth 3 (three) times daily.  ? desipramine (NORPRAMIN) 25 MG tablet Take 50 mg by mouth every morning.  ? doxycycline (VIBRA-TABS) 100 MG tablet  Take 1 tablet (100 mg total) by mouth 2 (two) times daily. X 7-10 days  ? mupirocin ointment (BACTROBAN) 2 % Apply 1 application. topically 2 (two) times daily. X 5 days to interior of nose  ? polyethylene glycol (MIRALAX / GLYCOLAX) 17 g packet Take 17 g by mouth daily as needed.  ? zolpidem (AMBIEN CR) 12.5 MG CR tablet Take 12.5 mg by mouth at bedtime as needed for sleep.  ? ?Allergies  ?Allergen Reactions  ? Haemophilus Influenzae Vaccines Other (See Comments)  ?  Guillain Barre   ? Oxycodone-Acetaminophen Nausea And Vomiting  ? Cephalexin   ?  Other reaction(s): HIVES  ? ?No results found for this or any previous visit (from the past 2160 hour(s)). ?Objective  ?Body mass index is 27.66 kg/m?. ?Wt Readings from Last 3 Encounters:  ?09/04/21 171 lb 6.4 oz (77.7 kg)  ?07/31/21 167 lb (75.8 kg)  ?04/14/21 167 lb (75.8 kg)  ? ?Temp Readings from Last 3 Encounters:  ?09/04/21 98.3 ?F (36.8 ?C) (Oral)  ?12/03/20 (!) 96.3 ?F (35.7 ?C) (Temporal)  ?11/24/20 (!) 96.7 ?F (35.9 ?C)  ? ?BP Readings from Last 3 Encounters:  ?09/04/21 120/80  ?12/03/20 (!) 142/78  ?11/24/20 115/74  ? ?Pulse Readings from Last 3 Encounters:  ?09/04/21 66  ?12/03/20 67  ?11/24/20 78  ? ? ?Physical Exam ?Vitals and nursing note reviewed.  ?Constitutional:   ?   Appearance: Normal appearance. She is well-developed and well-groomed.  ?HENT:  ?   Head: Normocephalic and atraumatic.  ?Eyes:  ?   Conjunctiva/sclera: Conjunctivae normal.  ?   Pupils: Pupils are equal, round, and reactive to light.  ?Cardiovascular:  ?   Rate and Rhythm: Normal rate and regular rhythm.  ?   Heart sounds: Normal heart sounds. No murmur heard. ?Pulmonary:  ?   Effort: Pulmonary effort is normal.  ?   Breath sounds: Normal breath sounds.  ?Abdominal:  ?   General: Abdomen is flat. Bowel sounds are normal.  ?   Tenderness: There is no abdominal tenderness.  ?Musculoskeletal:     ?   General: No tenderness.  ?Skin: ?   General: Skin is warm and dry.  ? ?    ?Neurological:   ?   General: No focal deficit present.  ?   Mental Status: She is alert and oriented to person, place, and time. Mental status is at baseline.  ?   Cranial Nerves: Cranial nerves 2-12 are intact.  ?   Sensory: Sensation is intact.  ?   Motor: Motor function  is intact.  ?   Coordination: Coordination is intact.  ?   Gait: Gait is intact.  ?Psychiatric:     ?   Attention and Perception: Attention and perception normal.     ?   Mood and Affect: Mood and affect normal.     ?   Speech: Speech normal.     ?   Behavior: Behavior normal. Behavior is cooperative.     ?   Thought Content: Thought content normal.     ?   Cognition and Memory: Cognition and memory normal.     ?   Judgment: Judgment normal.  ? ? ?Assessment  ?Plan  ?Boil, thigh right lateral thigh 2.5-3 cm vs spider bite vs developing cellulitis- Plan: doxycycline (VIBRA-TABS) 100 MG tablet bid x 7-10 days with food ? ?Nasal sore - Plan: mupirocin ointment (BACTROBAN) 2 %  ? ?Dove antibacterial soap  ?Call back if not better may need surgical I&D ? ?Provider: Dr. Olivia Mackie McLean-Scocuz[facility]-Internal Medicine  ?

## 2021-09-07 ENCOUNTER — Telehealth: Payer: Self-pay | Admitting: Internal Medicine

## 2021-09-07 NOTE — Telephone Encounter (Signed)
Pt called in stating she had an appointment with [facility] for her boil. Pt was given  antibiotics and she does not think its working. Pt is still hurting and it hurts when she walks. Pt would like a call back. ?

## 2021-09-08 ENCOUNTER — Encounter: Payer: Self-pay | Admitting: Internal Medicine

## 2021-09-08 ENCOUNTER — Ambulatory Visit: Payer: Medicare PPO | Admitting: Internal Medicine

## 2021-09-08 ENCOUNTER — Other Ambulatory Visit: Payer: Self-pay

## 2021-09-08 VITALS — BP 124/70 | HR 113 | Temp 97.8°F | Ht 66.0 in | Wt 172.2 lb

## 2021-09-08 DIAGNOSIS — L02429 Furuncle of limb, unspecified: Secondary | ICD-10-CM | POA: Insufficient documentation

## 2021-09-08 DIAGNOSIS — Z1231 Encounter for screening mammogram for malignant neoplasm of breast: Secondary | ICD-10-CM

## 2021-09-08 DIAGNOSIS — L02412 Cutaneous abscess of left axilla: Secondary | ICD-10-CM | POA: Diagnosis not present

## 2021-09-08 DIAGNOSIS — L02419 Cutaneous abscess of limb, unspecified: Secondary | ICD-10-CM | POA: Diagnosis not present

## 2021-09-08 DIAGNOSIS — W57XXXA Bitten or stung by nonvenomous insect and other nonvenomous arthropods, initial encounter: Secondary | ICD-10-CM | POA: Diagnosis not present

## 2021-09-08 DIAGNOSIS — S70361A Insect bite (nonvenomous), right thigh, initial encounter: Secondary | ICD-10-CM | POA: Diagnosis not present

## 2021-09-08 NOTE — Assessment & Plan Note (Signed)
THE BOIL HAS FAILED TO SPONTANEOUSLY DRAIN DESPITE 4 DAYS OF COMPRESSES AND EMPIRIC COVERAGE FOR MRSA.  SHE WILL REQUIRE INCISION AND DRAINAGE AND POTENTIALLY PACKING.  URGENT REFERRAL TO DR Bary Castilla , WHO WILL SEE HER THIS AFTERNOON  ?

## 2021-09-08 NOTE — Progress Notes (Signed)
? ?Subjective:  ?Patient ID: Holly [name], female    DOB: 02-26-1946  Age: 76 y.o. MRN: [id number] ? ?CC: The primary encounter diagnosis was Insect bite of right thigh, initial encounter. Diagnoses of Boil, thigh and Breast cancer screening by mammogram were also pertinent to this visit. ? ? ?This visit occurred during the SARS-CoV-2 public health emergency.  Safety protocols were in place, including screening questions prior to the visit, additional usage of staff PPE, and extensive cleaning of exam room while observing appropriate contact time as indicated for disinfecting solutions.   ? ?HPI ?Holly [name] presents for follow up on  ?Chief Complaint  ?Patient presents with  ? Follow-up  ?  Pt c/o boil on thigh x 1 week. Saw Dr Olivia Mackie 3/24. Given DOXYCYCLINE. Pt states thinks boil is getting worse. Very painful. Swollen. Red. No drainage.  ? ?76 yr old female here for follow up on a right thigh boil that she believes resulted from an insect bite.  The boil was not fluctuant last week during evaluation by Dr Aundra Dubin.  She was placed on doxYcycline and advised to apply warm compresses frequently.  She returns today for follow up and reports that the size of the boil has increased and has become exquisitely painful to touch.  It is not draining.  She is taking the medications and applying compresses once or twice daily.  She denies fevers   ? ? ?Outpatient Medications Prior to Visit  ?Medication Sig Dispense Refill  ? ARIPiprazole (ABILIFY) 5 MG tablet Take 5 mg by mouth daily.    ? Cholecalciferol (VITAMIN D) 50 MCG (2000 UT) CAPS Take 1 capsule by mouth daily.    ? clorazepate (TRANXENE) 7.5 MG tablet Take 7.5 mg by mouth 3 (three) times daily.    ? desipramine (NORPRAMIN) 25 MG tablet Take 50 mg by mouth every morning.    ? doxycycline (VIBRA-TABS) 100 MG tablet Take 1 tablet (100 mg total) by mouth 2 (two) times daily. X 7-10 days 20 tablet 0  ? mupirocin ointment (BACTROBAN) 2 % Apply 1 application. topically 2  (two) times daily. X 5 days to interior of nose 30 g 0  ? polyethylene glycol (MIRALAX / GLYCOLAX) 17 g packet Take 17 g by mouth daily as needed.    ? zolpidem (AMBIEN CR) 12.5 MG CR tablet Take 12.5 mg by mouth at bedtime as needed for sleep.    ? ?No facility-administered medications prior to visit.  ? ? ?Review of Systems; ? ?Patient denies headache, fevers, malaise, unintentional weight loss, skin rash, eye pain, sinus congestion and sinus pain, sore throat, dysphagia,  hemoptysis , cough, dyspnea, wheezing, chest pain, palpitations, orthopnea, edema, abdominal pain, nausea, melena, diarrhea, constipation, flank pain, dysuria, hematuria, urinary  Frequency, nocturia, numbness, tingling, seizures,  Focal weakness, Loss of consciousness,  Tremor, insomnia, depression, anxiety, and suicidal ideation.   ? ? ? ?Objective:  ?BP 124/70 (BP Location: Left Arm, Patient Position: Sitting, Cuff Size: Large)   Pulse (!) 113   Temp 97.8 ?F (36.6 ?C) (Oral)   Ht '5\' 6"'$  (1.676 m)   Wt 172 lb 3.2 oz (78.1 kg)   SpO2 98%   BMI 27.79 kg/m?  ? ?BP Readings from Last 3 Encounters:  ?09/08/21 124/70  ?09/04/21 120/80  ?12/03/20 (!) 142/78  ? ? ?Wt Readings from Last 3 Encounters:  ?09/08/21 172 lb 3.2 oz (78.1 kg)  ?09/04/21 171 lb 6.4 oz (77.7 kg)  ?07/31/21 167 lb (75.8 kg)  ? ? ?  General appearance: alert, cooperative and appears stated age ?Ears: normal TM's and external ear canals both ears ?Throat: lips, mucosa, and tongue normal; teeth and gums normal ?Neck: no adenopathy, no carotid bruit, supple, symmetrical, trachea midline and thyroid not enlarged, symmetric, no tenderness/mass/nodules ?Back: symmetric, no curvature. ROM normal. No CVA tenderness. ?Lungs: clear to auscultation bilaterally ?Heart: regular rate and rhythm, S1, S2 normal, no murmur, click, rub or gallop ?Abdomen: soft, non-tender; bowel sounds normal; no masses,  no organomegaly ?Pulses: 2+ and symmetric ?Skin: right upper thigh with erythematous  fluctuant boil with a central pore but  not spontaneously draining.  ? Lymph nodes: Cervical, supraclavicular, and axillary nodes normal. ? ?Lab Results  ?Component Value Date  ? HGBA1C 5.9 12/03/2020  ? ? ?Lab Results  ?Component Value Date  ? CREATININE 0.94 12/03/2020  ? CREATININE 0.98 06/11/2020  ? CREATININE 0.79 11/27/2019  ? ? ?Lab Results  ?Component Value Date  ? WBC 4.6 06/11/2020  ? HGB 11.9 (L) 06/11/2020  ? HCT 36.1 06/11/2020  ? PLT 270.0 06/11/2020  ? GLUCOSE 84 12/03/2020  ? CHOL 223 (H) 06/11/2020  ? TRIG 101.0 06/11/2020  ? HDL 87.50 06/11/2020  ? LDLDIRECT 129.0 06/30/2015  ? LDLCALC 115 (H) 06/11/2020  ? ALT 10 12/03/2020  ? AST 11 12/03/2020  ? NA 140 12/03/2020  ? K 4.5 12/03/2020  ? CL 105 12/03/2020  ? CREATININE 0.94 12/03/2020  ? BUN 21 12/03/2020  ? CO2 27 12/03/2020  ? TSH 1.82 12/03/2020  ? INR 0.9 12/24/2012  ? HGBA1C 5.9 12/03/2020  ? ? ?MM 3D SCREEN BREAST BILATERAL ? ?Result Date: 08/10/2020 ?CLINICAL DATA:  Screening. EXAM: DIGITAL SCREENING BILATERAL MAMMOGRAM WITH TOMOSYNTHESIS AND CAD TECHNIQUE: Bilateral screening digital craniocaudal and mediolateral oblique mammograms were obtained. Bilateral screening digital breast tomosynthesis was performed. The images were evaluated with computer-aided detection. COMPARISON:  Previous exam(s). ACR Breast Density Category b: There are scattered areas of fibroglandular density. FINDINGS: There are no findings suspicious for malignancy. IMPRESSION: No mammographic evidence of malignancy. A result letter of this screening mammogram will be mailed directly to the patient. RECOMMENDATION: Screening mammogram in one year. (Code:SM-B-01Y) BI-RADS CATEGORY  1: Negative. Electronically Signed   By: Lillia Mountain M.D.   On: 08/10/2020 11:54  ? ? ?Assessment & Plan:  ? ?Problem List Items Addressed This Visit   ? ? Insect bite of right thigh - Primary  ? Relevant Orders  ? Ambulatory referral to General Surgery  ? Boil, thigh  ?  THE BOIL HAS FAILED TO  SPONTANEOUSLY DRAIN DESPITE 4 DAYS OF COMPRESSES AND EMPIRIC COVERAGE FOR MRSA.  SHE WILL REQUIRE INCISION AND DRAINAGE AND POTENTIALLY PACKING.  URGENT REFERRAL TO DR Bary Castilla , WHO WILL SEE HER THIS AFTERNOON  ?  ?  ? Relevant Orders  ? Ambulatory referral to General Surgery  ? ?Other Visit Diagnoses   ? ? Breast cancer screening by mammogram      ? Relevant Orders  ? MM 3D SCREEN BREAST BILATERAL  ? ?  ? ? ?I spent 15 minutes dedicated to the care of this patient on the date of this encounter to include pre-visit review of patient's medical history,  most recent  office visits with me and patient's specialists,  most recent ER visit/hospitalization, EKG, imaging studies, Face-to-face time with the patient , and post visit ordering of testing and therapeutics.   ? ?Follow-up: No follow-ups on file. ? ? ?Crecencio Mc, MD ?

## 2021-09-08 NOTE — Patient Instructions (Addendum)
PLEASE ARRIVE AT [facility] IN TIME FOR A 2 PM APPT WITH DR BYRNETT ? ? ? ?

## 2021-09-09 ENCOUNTER — Ambulatory Visit: Payer: Medicare PPO | Admitting: *Deleted

## 2021-09-09 DIAGNOSIS — F419 Anxiety disorder, unspecified: Secondary | ICD-10-CM

## 2021-09-09 DIAGNOSIS — F330 Major depressive disorder, recurrent, mild: Secondary | ICD-10-CM | POA: Diagnosis not present

## 2021-09-09 DIAGNOSIS — F331 Major depressive disorder, recurrent, moderate: Secondary | ICD-10-CM

## 2021-09-09 DIAGNOSIS — F4312 Post-traumatic stress disorder, chronic: Secondary | ICD-10-CM | POA: Diagnosis not present

## 2021-09-09 DIAGNOSIS — F341 Dysthymic disorder: Secondary | ICD-10-CM | POA: Diagnosis not present

## 2021-09-09 NOTE — Patient Instructions (Signed)
Visit Information ? ?Thank you for taking time to visit with me today. Please don't hesitate to contact me if I can be of assistance to you before our next scheduled telephone appointment. ? ?Following are the goals we discussed today:  ?Please continue to utilize [facility] [place] (# [phone]), as a valuable resource, not only for concerns related to [employer], but also to obtain victim advocacy services. ?During Civil Court Proceeding on 07/20/2021, the judge denied your request to extend the 50B Restraining Order against your daughter, so continue to be mindful of your surroundings, taking extra precautionary measures to protect your health, safety, and well-being.    ?Contact 911 immediately, if you ever feel scared, threatened, or if you believe your life is in imminent danger. ?Please continue to follow up with your current therapist ?Contact LCSW directly if you have questions, need assistance, or if additional social work needs are identified in the near future.   ? ? ?Please call the care guide team at [phone] if you need to cancel or reschedule your appointment.  ? ?If you are experiencing a Mental Health or [place] or need someone to talk to, please call the Suicide and Crisis Lifeline: 988  ? ?Patient verbalizes understanding of instructions and care plan provided today and agrees to view in [place]. Active MyChart status confirmed with patient.   ? ?No further follow up required: patient to follow up with current therapist ? ?Tahari Clabaugh, LCSW ?[place] ?[phone] ? ?

## 2021-09-09 NOTE — Chronic Care Management (AMB) (Signed)
?Chronic Care Management  ? ? Clinical Social Work Note ? ?09/09/2021 ?Name: OVIE EASTEP MRN: [id number] DOB: 01/03/1946 ? ?IGNACIA GENTZLER is a 76 y.o. year old female who is a primary care patient of Tullo, Aris Everts, MD. The CCM team was consulted to assist the patient with chronic disease management and/or care coordination needs related to: Mental Health Counseling and Resources.  ? ?Engaged with patient by telephone for follow up visit in response to provider referral for social work chronic care management and care coordination services.  ? ?Consent to Services:  ?The patient was given information about Chronic Care Management services, agreed to services, and gave verbal consent prior to initiation of services.  Please see initial visit note for detailed documentation.  ? ?Patient agreed to services and consent obtained.  ? ?Assessment: Review of patient past medical history, allergies, medications, and health status, including review of relevant consultants reports was performed today as part of a comprehensive evaluation and provision of chronic care management and care coordination services.    ? ?SDOH (Social Determinants of Health) assessments and interventions performed:   ? ?Advanced Directives Status: Not addressed in this encounter. ? ?CCM Care Plan ? ?Allergies  ?Allergen Reactions  ? Haemophilus Influenzae Vaccines Other (See Comments)  ?  Guillain Barre   ? Oxycodone-Acetaminophen Nausea And Vomiting  ? Cephalexin   ?  Other reaction(s): HIVES  ? ? ?Outpatient Encounter Medications as of 09/09/2021  ?Medication Sig Note  ? ARIPiprazole (ABILIFY) 5 MG tablet Take 5 mg by mouth daily.   ? Cholecalciferol (VITAMIN D) 50 MCG (2000 UT) CAPS Take 1 capsule by mouth daily.   ? clorazepate (TRANXENE) 7.5 MG tablet Take 7.5 mg by mouth 3 (three) times daily. 09/10/2020: PRN, generally QPM  ? desipramine (NORPRAMIN) 25 MG tablet Take 50 mg by mouth every morning.   ? doxycycline (VIBRA-TABS) 100 MG tablet  Take 1 tablet (100 mg total) by mouth 2 (two) times daily. X 7-10 days   ? mupirocin ointment (BACTROBAN) 2 % Apply 1 application. topically 2 (two) times daily. X 5 days to interior of nose   ? polyethylene glycol (MIRALAX / GLYCOLAX) 17 g packet Take 17 g by mouth daily as needed.   ? zolpidem (AMBIEN CR) 12.5 MG CR tablet Take 12.5 mg by mouth at bedtime as needed for sleep.   ? ?No facility-administered encounter medications on file as of 09/09/2021.  ? ? ?Patient Active Problem List  ? Diagnosis Date Noted  ? Insect bite of right thigh 09/08/2021  ? Boil, thigh 09/08/2021  ? Chronic insomnia 04/14/2021  ? B12 deficiency 12/09/2020  ? Peripheral neuropathy 12/06/2020  ? Bilateral foot pain 12/06/2020  ? Restless legs 11/27/2019  ? Autonomous neurogenic bladder 06/24/2019  ? 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 Incomplete bladder emptying 01/11/2019  ? Eustachian tube disorder, right 11/22/2018  ? 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  ? Encounter for preventive health examination 07/01/2015  ? Colon cancer screening 03/16/2013  ? Routine general medical examination at a health care facility 03/16/2013  ? Right leg weakness 01/23/2013  ? History of Guillain-Barre syndrome 01/23/2013  ? 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 UNSPECIFIED VITAMIN D DEFICIENCY 08/26/2008  ? Recurrent depressive disorder, current episode moderate ([place]) 07/29/2008  ? ? ?Conditions to be addressed/monitored: Depression; Mental Health Concerns  ? ?Care Plan : LCSW Plan of Care  ?Updates made by Vern Claude, LCSW since 09/09/2021 12:00 AM  ?  ? ?Problem: Reduce and Manage Symptoms  of Anxiety and Depression.   ?Priority: High  ?  ? ?Long-Range Goal: Reduce and Manage Symptoms of Anxiety and Depression.   ?Start Date: 09/03/2020  ?Expected End Date: 09/03/2021  ?This Visit's Progress: On track  ?Recent Progress: On track  ?Priority: High  ?Note:   ?Current Barriers:   ?Severe Persistent Mental Health needs related to Anxiety and Depression  requires Support, Education, Resources, Referrals and Care Coordination in order to meet unmet mental health needs.  ?Clinical Goal(s):  ?Patient will work with LCSW to reduce and manage symptoms of Anxiety and Depression, until well-managed.   ?Clinical Interventions:  ?Provided mental health support, counseling, coaching, education and interventions related to family conflicts ?Protection order against family member not granted, safety precautions continue to be emphasized ?Verbalization of feelings encouraged related to her daughter and related feelings of grief and loss, patient provided with validation and encouragement ?Importance of maintaining boundaries continued to be discussed, the use of positive coping strategies reinforced ?Motivational Interviewing, Cognitive Behavioral Therapy, Client-Centered Therapy performed.   ?Patient confirmed that she continues to be  active with a therapist and psychiatrist through the [place] at this time and that CCM involvement was due to support needs related to the Protection Order-the protection order was not granted, however patient will continue to utilize The [facility] for advocacy services ?Collaboration with Primary Care Physician, Dr. Deborra Medina regarding development and update of comprehensive plan of care as evidenced by provider attestation and co-signature. ?Patient encouraged to contact this social worker with any additional community resource needs ?Patient Goals/Self-Care Activities: ?Please continue to utilize [facility] [place] (# [phone]), as a valuable resource, not only for concerns related to [employer], but also to obtain victim advocacy services. ?During Civil Court Proceeding on 07/20/2021, the judge denied your request to extend the 50B Restraining Order against your daughter, so continue to be mindful of your surroundings, taking extra  precautionary measures to protect your health, safety, and well-being.    ?Contact 911 immediately, if you ever feel scared, threatened, or if you believe your life is in imminent danger. ?Contact LCSW directly if you have questions, need assistance, or if additional social work needs are identified in the near future.   ? ?  ?  ? ?Follow Up Plan: Patient to continue to follow up with the [facility] for ongoing mental health services. Patient to contact this Education officer, museum with any additional community resource needs. ?     ?[employer], LCSW ?[place] ?[phone] ? ? ? ?

## 2021-09-17 NOTE — Telephone Encounter (Signed)
Spoke with pt to make sure that she was not still having any issues with the boil. The pt stated that she saw Dr. Bary Castilla after her appt with Dr. Derrel Nip on 09/08/2021. She stated that it was lanced and cultured, the results came back negative for MRSA but showed multi flora. Pt also stated that she followed up with Dr. Bary Castilla this morning and he stated that everything is looking better.  ?

## 2021-09-17 NOTE — Telephone Encounter (Signed)
Patient was seen again by Dr Derrel Nip on 09/08/21 about her boil. Telephone message was from 09/07/21. Did Patient call back in about this on 09/16/21?  ? ?Message routed to me with no additional note.  ?

## 2021-09-21 DIAGNOSIS — F330 Major depressive disorder, recurrent, mild: Secondary | ICD-10-CM | POA: Diagnosis not present

## 2021-10-05 DIAGNOSIS — F341 Dysthymic disorder: Secondary | ICD-10-CM | POA: Diagnosis not present

## 2021-10-05 DIAGNOSIS — F419 Anxiety disorder, unspecified: Secondary | ICD-10-CM | POA: Diagnosis not present

## 2021-10-05 DIAGNOSIS — F330 Major depressive disorder, recurrent, mild: Secondary | ICD-10-CM | POA: Diagnosis not present

## 2021-11-03 DIAGNOSIS — F330 Major depressive disorder, recurrent, mild: Secondary | ICD-10-CM | POA: Diagnosis not present

## 2021-11-04 DIAGNOSIS — F4312 Post-traumatic stress disorder, chronic: Secondary | ICD-10-CM | POA: Diagnosis not present

## 2021-11-04 DIAGNOSIS — F3342 Major depressive disorder, recurrent, in full remission: Secondary | ICD-10-CM | POA: Diagnosis not present

## 2021-11-04 DIAGNOSIS — F341 Dysthymic disorder: Secondary | ICD-10-CM | POA: Diagnosis not present

## 2021-11-25 ENCOUNTER — Telehealth: Payer: Self-pay

## 2021-11-25 ENCOUNTER — Ambulatory Visit: Payer: Medicare PPO

## 2021-11-25 DIAGNOSIS — M1712 Unilateral primary osteoarthritis, left knee: Secondary | ICD-10-CM | POA: Diagnosis not present

## 2021-11-25 NOTE — Telephone Encounter (Signed)
Unable to reach patient for scheduled AWV. No answer. Left message to call the office back and reschedule.

## 2021-12-16 DIAGNOSIS — F332 Major depressive disorder, recurrent severe without psychotic features: Secondary | ICD-10-CM | POA: Diagnosis not present

## 2021-12-16 DIAGNOSIS — F341 Dysthymic disorder: Secondary | ICD-10-CM | POA: Diagnosis not present

## 2021-12-16 DIAGNOSIS — F4312 Post-traumatic stress disorder, chronic: Secondary | ICD-10-CM | POA: Diagnosis not present

## 2021-12-16 DIAGNOSIS — F419 Anxiety disorder, unspecified: Secondary | ICD-10-CM | POA: Diagnosis not present

## 2021-12-23 DIAGNOSIS — F330 Major depressive disorder, recurrent, mild: Secondary | ICD-10-CM | POA: Diagnosis not present

## 2021-12-23 DIAGNOSIS — F419 Anxiety disorder, unspecified: Secondary | ICD-10-CM | POA: Diagnosis not present

## 2021-12-23 DIAGNOSIS — F341 Dysthymic disorder: Secondary | ICD-10-CM | POA: Diagnosis not present

## 2021-12-23 DIAGNOSIS — F4312 Post-traumatic stress disorder, chronic: Secondary | ICD-10-CM | POA: Diagnosis not present

## 2022-01-22 ENCOUNTER — Encounter: Payer: Self-pay | Admitting: *Deleted

## 2022-01-26 DIAGNOSIS — F330 Major depressive disorder, recurrent, mild: Secondary | ICD-10-CM | POA: Diagnosis not present

## 2022-01-26 DIAGNOSIS — F341 Dysthymic disorder: Secondary | ICD-10-CM | POA: Diagnosis not present

## 2022-01-26 DIAGNOSIS — F419 Anxiety disorder, unspecified: Secondary | ICD-10-CM | POA: Diagnosis not present

## 2022-01-26 DIAGNOSIS — F4312 Post-traumatic stress disorder, chronic: Secondary | ICD-10-CM | POA: Diagnosis not present

## 2022-01-29 NOTE — Telephone Encounter (Signed)
Patient is aware of $40 copay due at time of injection via mychart.  [employer] PA Approved:  -XBLT#[id number] -Eff: 07/27/19-06/13/22

## 2022-02-01 DIAGNOSIS — F330 Major depressive disorder, recurrent, mild: Secondary | ICD-10-CM | POA: Diagnosis not present

## 2022-02-04 ENCOUNTER — Telehealth: Payer: Self-pay | Admitting: Internal Medicine

## 2022-02-04 NOTE — Telephone Encounter (Signed)
Name: Holly [name]  Phone number: [phone] Provider: Dr Tobie Lords patient to schedule her AWV.  She state she needed a physical with Dr. Derrel Nip.  Please call patient to scheduled.  Thanks [employer]

## 2022-02-05 ENCOUNTER — Ambulatory Visit (INDEPENDENT_AMBULATORY_CARE_PROVIDER_SITE_OTHER): Payer: Medicare PPO

## 2022-02-05 VITALS — Ht 66.0 in | Wt 172.0 lb

## 2022-02-05 DIAGNOSIS — Z0000 Encounter for general adult medical examination without abnormal findings: Secondary | ICD-10-CM

## 2022-02-05 NOTE — Telephone Encounter (Signed)
The patient is aware that her appointment has been scheduled for 10/26 @ 9:30.

## 2022-02-05 NOTE — Progress Notes (Addendum)
Subjective:   Holly [name] is a 76 y.o. female who presents for Medicare Annual (Subsequent) preventive examination.  Review of Systems    No ROS.  Medicare Wellness Virtual Visit.  Visual/audio telehealth visit, UTA vital signs.   See social history for additional risk factors.   Cardiac Risk Factors include: advanced age (>39men, >98 women);hypertension     Objective:    Today's Vitals   02/05/22 1136  Weight: 172 lb (78 kg)  Height: 5\' 6"  (1.676 m)   Body mass index is 27.76 kg/m.     02/05/2022   11:55 AM 11/24/2020   11:31 AM 09/16/2020    9:28 AM 09/03/2020   11:53 AM 11/22/2019   11:27 AM 11/21/2018    2:50 PM 10/22/2018    2:48 PM  Advanced Directives  Does Patient Have a Medical Advance Directive? Yes Yes Yes No Yes Yes Yes  Type of Estate agent of [place];Living will Healthcare Power of [employer] of [place];Living will  Healthcare Power of [place];Living will Healthcare Power of [place];Living will Healthcare Power of [place];Living will  Does patient want to make changes to medical advance directive? No - Patient declined No - Patient declined No - Patient declined  No - Patient declined No - Patient declined   Copy of Healthcare Power of Attorney in Chart? No - copy requested No - copy requested Yes - validated most recent copy scanned in chart (See row information)  No - copy requested No - copy requested No - copy requested  Would patient like information on creating a medical advance directive?   No - Patient declined No - Patient declined       Current Medications (verified) Outpatient Encounter Medications as of 02/05/2022  Medication Sig   ARIPiprazole (ABILIFY) 5 MG tablet Take 5 mg by mouth daily.   Cholecalciferol (VITAMIN D) 50 MCG (2000 UT) CAPS Take 1 capsule by mouth daily.   clorazepate (TRANXENE) 7.5 MG tablet Take 7.5 mg by mouth 3 (three) times daily.   desipramine (NORPRAMIN) 25 MG tablet Take 50 mg by  mouth every morning.   doxycycline (VIBRA-TABS) 100 MG tablet Take 1 tablet (100 mg total) by mouth 2 (two) times daily. X 7-10 days   mupirocin ointment (BACTROBAN) 2 % Apply 1 application. topically 2 (two) times daily. X 5 days to interior of nose   polyethylene glycol (MIRALAX / GLYCOLAX) 17 g packet Take 17 g by mouth daily as needed.   zolpidem (AMBIEN CR) 12.5 MG CR tablet Take 12.5 mg by mouth at bedtime as needed for sleep.   No facility-administered encounter medications on file as of 02/05/2022.   Allergies (verified) Haemophilus influenzae vaccines, Oxycodone-acetaminophen, and Cephalexin   History: Past Medical History:  Diagnosis Date   Anxiety    Concussion with loss of consciousness 04/10/2017   Depression    Guillain Barr syndrome (HCC) 2005   Osteoporosis    Radial fracture 01/23/2013   Right arm just proximal to wrist. Secondary to fall. Patient has a history of osteoporosis and requires vitamin D and repeat DEXA scan if possible.    Squamous cell skin cancer 2011   Past Surgical History:  Procedure Laterality Date   ABDOMINAL HYSTERECTOMY  1989   APPENDECTOMY  1960   BREAST EXCISIONAL BIOPSY  1980   benign   BREAST SURGERY  1980   biopsy   WRIST FRACTURE SURGERY Right 2015   Family History  Problem Relation Age of Onset  Cancer Mother    Mental illness Sister    Bipolar disorder Sister    Hypothyroidism Sister    Depression Brother    Mental illness Brother    Parkinson's disease Brother    Dystonia Brother    Anxiety disorder Daughter    Social History   Socioeconomic History   Marital status: Widowed    Spouse name: Not on file   Number of children: 1   Years of education: 23   Highest education level: 12th grade  Occupational History   Occupation: Retired  Tobacco Use   Smoking status: Former    Packs/day: 0.20    Types: Cigarettes    Quit date: 06/14/1977    Years since quitting: 44.6   Smokeless tobacco: Never  Vaping Use   Vaping  Use: Never used  Substance and Sexual Activity   Alcohol use: Never    Alcohol/week: 0.0 standard drinks of alcohol    Comment: social (about once a month)   Drug use: No   Sexual activity: Never  Other Topics Concern   Not on file  Social History Narrative   Not on file   Social Determinants of Health   Financial Resource Strain: Low Risk  (02/05/2022)   Overall Financial Resource Strain (CARDIA)    Difficulty of Paying Living Expenses: Not hard at all  Food Insecurity: No Food Insecurity (02/05/2022)   Hunger Vital Sign    Worried About Running Out of Food in the Last Year: Never true    Ran Out of Food in the Last Year: Never true  Transportation Needs: No Transportation Needs (02/05/2022)   PRAPARE - Administrator, Civil Service (Medical): No    Lack of Transportation (Non-Medical): No  Physical Activity: Inactive (09/03/2020)   Exercise Vital Sign    Days of Exercise per Week: 0 days    Minutes of Exercise per Session: 0 min  Stress: Stress Concern Present (02/05/2022)   [employer] of Occupational Health - Occupational Stress Questionnaire    Feeling of Stress : To some extent  Social Connections: Socially Isolated (02/05/2022)   Social Connection and Isolation Panel [NHANES]    Frequency of Communication with Friends and Family: More than three times a week    Frequency of Social Gatherings with Friends and Family: More than three times a week    Attends Religious Services: Never    Database administrator or Organizations: No    Attends Banker Meetings: Never    Marital Status: Widowed    Tobacco Counseling Counseling given: Not Answered  Clinical Intake: Pre-visit preparation completed: Yes        Diabetes: No  How often do you need to have someone help you when you read instructions, pamphlets, or other written materials from your doctor or pharmacy?: 1 - Never  Interpreter Needed?: No    Activities of Daily Living     02/05/2022   11:52 AM  In your present state of health, do you have any difficulty performing the following activities:  Hearing? 0  Vision? 0  Difficulty concentrating or making decisions? 0  Walking or climbing stairs? 0  Dressing or bathing? 0  Doing errands, shopping? 0  Preparing Food and eating ? N  Using the Toilet? N  In the past six months, have you accidently leaked urine? N  Do you have problems with loss of bowel control? N  Managing your Medications? N  Managing your Finances? N  Housekeeping  or managing your Housekeeping? N   Patient Care Team: Sherlene Shams, MD as PCP - General (Internal Medicine)  Indicate any recent Medical Services you may have received from other than [facility] providers in the past year (date may be approximate).     Assessment:   This is a routine wellness examination for [place].  Virtual Visit via Telephone Note  I connected with  Holly [name] on 02/05/22 at 11:30 AM EDT by telephone and verified that I am speaking with the correct person using two identifiers.  Location: Patient: home Provider: office Persons participating in the virtual visit: patient/Nurse Health Advisor   I discussed the limitations of performing an evaluation and management service by telehealth. We continued and completed visit with audio only. Some vital signs may be absent or patient reported.   Hearing/Vision screen Hearing Screening - Comments:: Patient is able to hear conversational tones without difficulty. No issues reported.  Vision Screening - Comments:: Followed by [facility]  Wears corrective lenses  They have regular follow up with the ophthalmologist  Dietary issues and exercise activities discussed: Current Exercise Habits: Home exercise routine, Intensity: Mild Regular diet Good water intake   Goals Addressed               This Visit's Progress     Patient Stated     Weight goal 150lb (pt-stated)   On track     Walk more for  exercise Low carb diet       Depression Screen    02/05/2022   11:49 AM 09/08/2021    7:32 AM 09/04/2021    3:36 PM 07/31/2021    4:47 PM 04/14/2021    1:31 PM 12/03/2020   11:35 AM 12/03/2020   11:27 AM  PHQ 2/9 Scores  PHQ - 2 Score 0 0 0 0 0 1 0  PHQ- 9 Score    0 0 3 0    Fall Risk    02/05/2022   11:50 AM 09/08/2021    7:32 AM 09/04/2021    3:36 PM 07/31/2021    4:47 PM 04/14/2021    1:31 PM  Fall Risk   Falls in the past year? 1 0 0 0 0  Comment Fell on sand when walking      Number falls in past yr: 0  0    Injury with Fall? 0  0    Risk for fall due to :  No Fall Risks No Fall Risks No Fall Risks No Fall Risks  Follow up Falls evaluation completed;Falls prevention discussed Falls evaluation completed Falls evaluation completed Falls evaluation completed Falls evaluation completed    FALL RISK PREVENTION PERTAINING TO THE HOME: Any stairs in or around the home? Yes  If so, are there any without handrails? No  Home free of loose throw rugs in walkways, pet beds, electrical cords, etc? Yes  Adequate lighting in your home to reduce risk of falls? Yes   ASSISTIVE DEVICES UTILIZED TO PREVENT FALLS: Life alert? No  Use of a cane, walker or w/c? No  Grab bars in the bathroom? No  Shower chair or bench in shower? No  Comfort chair height/elevated toilet? Yes  in one bathroom in the home.   TIMED UP AND GO: Was the test performed? No .   Cognitive Function:    11/21/2018    3:00 PM 09/29/2017    9:12 AM 09/15/2015    9:12 AM  MMSE - Mini Mental State Exam  Not completed: Unable to complete Unable to complete   Orientation to time   5  Orientation to Place   5  Registration   3  Attention/ Calculation   5  Recall   3  Language- name 2 objects   2  Language- repeat   1  Language- follow 3 step command   3  Language- read & follow direction   1  Write a sentence   1  Copy design   1  Total score   30        02/05/2022   11:53 AM  6CIT Screen  What Year? 0  points  What month? 0 points  What time? 0 points  Count back from 20 0 points  Months in reverse 0 points  Repeat phrase 0 points  Total Score 0 points   Immunizations Immunization History  Administered Date(s) Administered   PFIZER Comirnaty(Gray Top)Covid-19 Tri-Sucrose Vaccine 10/14/2020, 11/04/2020   Td 06/14/2006   TDAP status: Due, Education has been provided regarding the importance of this vaccine. Advised may receive this vaccine at local pharmacy or Health Dept. Aware to provide a copy of the vaccination record if obtained from local pharmacy or Health Dept. Verbalized acceptance and understanding.  Shingrix Completed?: No.    Education has been provided regarding the importance of this vaccine. Patient has been advised to call insurance company to determine out of pocket expense if they have not yet received this vaccine. Advised may also receive vaccine at local pharmacy or Health Dept. Verbalized acceptance and understanding.  Screening Tests Health Maintenance  Topic Date Due   MAMMOGRAM  08/06/2021   COVID-19 Vaccine (3 - Pfizer risk series) 02/21/2022 (Originally 12/02/2020)   TETANUS/TDAP  03/14/2022 (Originally 06/14/2016)   Zoster Vaccines- Shingrix (1 of 2) 05/08/2022 (Originally 01/26/1965)   Pneumonia Vaccine 24+ Years old (1 - PCV) 09/09/2022 (Originally 01/25/2013)   Fecal DNA (Cologuard)  04/12/2022   DEXA SCAN  Completed   Hepatitis C Screening  Completed   HPV VACCINES  Aged Out   Health Maintenance Health Maintenance Due  Topic Date Due   MAMMOGRAM  08/06/2021   Lung Cancer Screening: (Low Dose CT Chest recommended if Age 72-80 years, 30 pack-year currently smoking OR have quit w/in 15years.) does not qualify.   Vision Screening: Recommended annual ophthalmology exams for early detection of glaucoma and other disorders of the eye.  Dental Screening: Recommended annual dental exams for proper oral hygiene  Community Resource Referral / Chronic Care  Management: CRR required this visit?  No   CCM required this visit?  No      Plan:     I have personally reviewed and noted the following in the patient's chart:   Medical and social history Use of alcohol, tobacco or illicit drugs  Current medications and supplements including opioid prescriptions. Patient is not currently taking opioid prescriptions. Functional ability and status Nutritional status Physical activity Advanced directives List of other physicians Hospitalizations, surgeries, and ER visits in previous 12 months Vitals Screenings to include cognitive, depression, and falls Referrals and appointments  In addition, I have reviewed and discussed with patient certain preventive protocols, quality metrics, and best practice recommendations. A written personalized care plan for preventive services as well as general preventive health recommendations were provided to patient.     OBrien-Blaney, Braidyn Peace L, LPN   [id number]/[id number]    I have reviewed the above information and agree with above.   Duncan Dull, MD

## 2022-02-05 NOTE — Patient Instructions (Addendum)
[name] , Thank you for taking time to come for your Medicare Wellness Visit. I appreciate your ongoing commitment to your health goals. Please review the following plan we discussed and let me know if I can assist you in the future.   These are the goals we discussed:   Goals Addressed               This Visit's Progress     Patient Stated     Weight goal 150lb (pt-stated)   On track     Walk more for exercise Low carb diet      This is a list of the screening recommended for you and due dates:  Health Maintenance  Topic Date Due   Mammogram  08/06/2021   COVID-19 Vaccine (3 - Pfizer risk series) 02/21/2022*   Tetanus Vaccine  03/14/2022*   Zoster (Shingles) Vaccine (1 of 2) 05/08/2022*   Pneumonia Vaccine (1 - PCV) 09/09/2022*   Cologuard (Stool DNA test)  04/12/2022   DEXA scan (bone density measurement)  Completed   Hepatitis C Screening: USPSTF Recommendation to screen - Ages 43-79 yo.  Completed   HPV Vaccine  Aged Out  *Topic was postponed. The date shown is not the original due date.   Preventive Care 75 Years and Older, Female Preventive care refers to lifestyle choices and visits with your health care provider that can promote health and wellness. What does preventive care include? A yearly physical exam. This is also called an annual well check. Dental exams once or twice a year. Routine eye exams. Ask your health care provider how often you should have your eyes checked. Personal lifestyle choices, including: Daily care of your teeth and gums. Regular physical activity. Eating a healthy diet. Avoiding tobacco and drug use. Limiting alcohol use. Practicing safe sex. Taking low-dose aspirin every day. Taking vitamin and mineral supplements as recommended by your health care provider. What happens during an annual well check? The services and screenings done by your health care provider during your annual well check will depend on your age, overall  health, lifestyle risk factors, and family history of disease. Counseling  Your health care provider may ask you questions about your: Alcohol use. Tobacco use. Drug use. Emotional well-being. Home and relationship well-being. Sexual activity. Eating habits. History of falls. Memory and ability to understand (cognition). Work and work Statistician. Reproductive health. Screening  You may have the following tests or measurements: Height, weight, and BMI. Blood pressure. Lipid and cholesterol levels. These may be checked every 5 years, or more frequently if you are over 61 years old. Skin check. Lung cancer screening. You may have this screening every year starting at age 65 if you have a 30-pack-year history of smoking and currently smoke or have quit within the past 15 years. Fecal occult blood test (FOBT) of the stool. You may have this test every year starting at age 20. Flexible sigmoidoscopy or colonoscopy. You may have a sigmoidoscopy every 5 years or a colonoscopy every 10 years starting at age 9. Hepatitis C blood test. Hepatitis B blood test. Sexually transmitted disease (STD) testing. Diabetes screening. This is done by checking your blood sugar (glucose) after you have not eaten for a while (fasting). You may have this done every 1-3 years. Bone density scan. This is done to screen for osteoporosis. You may have this done starting at age 85. Mammogram. This may be done every 1-2 years. Talk to your health care provider about how  often you should have regular mammograms. Talk with your health care provider about your test results, treatment options, and if necessary, the need for more tests. Vaccines  Your health care provider may recommend certain vaccines, such as: Influenza vaccine. This is recommended every year. Tetanus, diphtheria, and acellular pertussis (Tdap, Td) vaccine. You may need a Td booster every 10 years. Zoster vaccine. You may need this after age  60. Pneumococcal 13-valent conjugate (PCV13) vaccine. One dose is recommended after age 38. Pneumococcal polysaccharide (PPSV23) vaccine. One dose is recommended after age 38. Talk to your health care provider about which screenings and vaccines you need and how often you need them. This information is not intended to replace advice given to you by your health care provider. Make sure you discuss any questions you have with your health care provider. Document Released: 06/27/2015 Document Revised: 02/18/2016 Document Reviewed: 04/01/2015 Elsevier Interactive Patient Education  2017 [place] Prevention in the Home Falls can cause injuries. They can happen to people of all ages. There are many things you can do to make your home safe and to help prevent falls. What can I do on the outside of my home? Regularly fix the edges of walkways and driveways and fix any cracks. Remove anything that might make you trip as you walk through a door, such as a raised step or threshold. Trim any bushes or trees on the path to your home. Use bright outdoor lighting. Clear any walking paths of anything that might make someone trip, such as rocks or tools. Regularly check to see if handrails are loose or broken. Make sure that both sides of any steps have handrails. Any raised decks and porches should have guardrails on the edges. Have any leaves, snow, or ice cleared regularly. Use sand or salt on walking paths during winter. Clean up any spills in your garage right away. This includes oil or grease spills. What can I do in the bathroom? Use night lights. Install grab bars by the toilet and in the tub and shower. Do not use towel bars as grab bars. Use non-skid mats or decals in the tub or shower. If you need to sit down in the shower, use a plastic, non-slip stool. Keep the floor dry. Clean up any water that spills on the floor as soon as it happens. Remove soap buildup in the tub or shower  regularly. Attach bath mats securely with double-sided non-slip rug tape. Do not have throw rugs and other things on the floor that can make you trip. What can I do in the bedroom? Use night lights. Make sure that you have a light by your bed that is easy to reach. Do not use any sheets or blankets that are too big for your bed. They should not hang down onto the floor. Have a firm chair that has side arms. You can use this for support while you get dressed. Do not have throw rugs and other things on the floor that can make you trip. What can I do in the kitchen? Clean up any spills right away. Avoid walking on wet floors. Keep items that you use a lot in easy-to-reach places. If you need to reach something above you, use a strong step stool that has a grab bar. Keep electrical cords out of the way. Do not use floor polish or wax that makes floors slippery. If you must use wax, use non-skid floor wax. Do not have throw rugs and other things  on the floor that can make you trip. What can I do with my stairs? Do not leave any items on the stairs. Make sure that there are handrails on both sides of the stairs and use them. Fix handrails that are broken or loose. Make sure that handrails are as long as the stairways. Check any carpeting to make sure that it is firmly attached to the stairs. Fix any carpet that is loose or worn. Avoid having throw rugs at the top or bottom of the stairs. If you do have throw rugs, attach them to the floor with carpet tape. Make sure that you have a light switch at the top of the stairs and the bottom of the stairs. If you do not have them, ask someone to add them for you. What else can I do to help prevent falls? Wear shoes that: Do not have high heels. Have rubber bottoms. Are comfortable and fit you well. Are closed at the toe. Do not wear sandals. If you use a stepladder: Make sure that it is fully opened. Do not climb a closed stepladder. Make sure that  both sides of the stepladder are locked into place. Ask someone to hold it for you, if possible. Clearly mark and make sure that you can see: Any grab bars or handrails. First and last steps. Where the edge of each step is. Use tools that help you move around (mobility aids) if they are needed. These include: Canes. Walkers. Scooters. Crutches. Turn on the lights when you go into a dark area. Replace any light bulbs as soon as they burn out. Set up your furniture so you have a clear path. Avoid moving your furniture around. If any of your floors are uneven, fix them. If there are any pets around you, be aware of where they are. Review your medicines with your doctor. Some medicines can make you feel dizzy. This can increase your chance of falling. Ask your doctor what other things that you can do to help prevent falls. This information is not intended to replace advice given to you by your health care provider. Make sure you discuss any questions you have with your health care provider. Document Released: 03/27/2009 Document Revised: 11/06/2015 Document Reviewed: 07/05/2014 Elsevier Interactive Patient Education  2017 [employer].

## 2022-02-17 ENCOUNTER — Ambulatory Visit (INDEPENDENT_AMBULATORY_CARE_PROVIDER_SITE_OTHER): Payer: Medicare PPO

## 2022-02-17 DIAGNOSIS — M810 Age-related osteoporosis without current pathological fracture: Secondary | ICD-10-CM | POA: Diagnosis not present

## 2022-02-17 MED ORDER — DENOSUMAB 60 MG/ML ~~LOC~~ SOSY
60.0000 mg | PREFILLED_SYRINGE | Freq: Once | SUBCUTANEOUS | Status: AC
  Administered 2022-02-17: 60 mg via SUBCUTANEOUS

## 2022-02-17 NOTE — Progress Notes (Signed)
Patient came in today for Prolia injection given in left arm. Patient expressed no signs of distress during injection.

## 2022-02-23 DIAGNOSIS — F341 Dysthymic disorder: Secondary | ICD-10-CM | POA: Diagnosis not present

## 2022-02-23 DIAGNOSIS — F419 Anxiety disorder, unspecified: Secondary | ICD-10-CM | POA: Diagnosis not present

## 2022-02-23 DIAGNOSIS — F330 Major depressive disorder, recurrent, mild: Secondary | ICD-10-CM | POA: Diagnosis not present

## 2022-02-23 DIAGNOSIS — F4312 Post-traumatic stress disorder, chronic: Secondary | ICD-10-CM | POA: Diagnosis not present

## 2022-03-17 DIAGNOSIS — F330 Major depressive disorder, recurrent, mild: Secondary | ICD-10-CM | POA: Diagnosis not present

## 2022-03-29 DIAGNOSIS — F419 Anxiety disorder, unspecified: Secondary | ICD-10-CM | POA: Diagnosis not present

## 2022-03-29 DIAGNOSIS — F341 Dysthymic disorder: Secondary | ICD-10-CM | POA: Diagnosis not present

## 2022-03-29 DIAGNOSIS — F330 Major depressive disorder, recurrent, mild: Secondary | ICD-10-CM | POA: Diagnosis not present

## 2022-03-29 DIAGNOSIS — F4312 Post-traumatic stress disorder, chronic: Secondary | ICD-10-CM | POA: Diagnosis not present

## 2022-04-08 ENCOUNTER — Encounter: Payer: Self-pay | Admitting: Internal Medicine

## 2022-04-08 ENCOUNTER — Ambulatory Visit (INDEPENDENT_AMBULATORY_CARE_PROVIDER_SITE_OTHER): Payer: Medicare PPO | Admitting: Internal Medicine

## 2022-04-08 VITALS — BP 132/76 | HR 77 | Temp 97.9°F | Ht 66.0 in | Wt 166.4 lb

## 2022-04-08 DIAGNOSIS — H6121 Impacted cerumen, right ear: Secondary | ICD-10-CM | POA: Insufficient documentation

## 2022-04-08 DIAGNOSIS — Z0000 Encounter for general adult medical examination without abnormal findings: Secondary | ICD-10-CM

## 2022-04-08 DIAGNOSIS — E559 Vitamin D deficiency, unspecified: Secondary | ICD-10-CM

## 2022-04-08 DIAGNOSIS — Z8669 Personal history of other diseases of the nervous system and sense organs: Secondary | ICD-10-CM

## 2022-04-08 DIAGNOSIS — E785 Hyperlipidemia, unspecified: Secondary | ICD-10-CM

## 2022-04-08 DIAGNOSIS — Z79899 Other long term (current) drug therapy: Secondary | ICD-10-CM | POA: Diagnosis not present

## 2022-04-08 DIAGNOSIS — Z789 Other specified health status: Secondary | ICD-10-CM | POA: Diagnosis not present

## 2022-04-08 DIAGNOSIS — R7301 Impaired fasting glucose: Secondary | ICD-10-CM | POA: Diagnosis not present

## 2022-04-08 DIAGNOSIS — Z1211 Encounter for screening for malignant neoplasm of colon: Secondary | ICD-10-CM | POA: Diagnosis not present

## 2022-04-08 LAB — COMPREHENSIVE METABOLIC PANEL
ALT: 11 U/L (ref 0–35)
AST: 14 U/L (ref 0–37)
Albumin: 4.7 g/dL (ref 3.5–5.2)
Alkaline Phosphatase: 37 U/L — ABNORMAL LOW (ref 39–117)
BUN: 19 mg/dL (ref 6–23)
CO2: 29 mEq/L (ref 19–32)
Calcium: 9.9 mg/dL (ref 8.4–10.5)
Chloride: 100 mEq/L (ref 96–112)
Creatinine, Ser: 0.89 mg/dL (ref 0.40–1.20)
GFR: 63.97 mL/min (ref >60.00)
Glucose, Bld: 87 mg/dL (ref 70–99)
Potassium: 4 mEq/L (ref 3.5–5.1)
Sodium: 138 mEq/L (ref 135–145)
Total Bilirubin: 0.4 mg/dL (ref 0.2–1.2)
Total Protein: 7.3 g/dL (ref 6.0–8.3)

## 2022-04-08 LAB — CBC WITH DIFFERENTIAL/PLATELET
Basophils Absolute: 0 10*3/uL (ref 0.0–0.1)
Basophils Relative: 0.5 % (ref 0.0–3.0)
Eosinophils Absolute: 0 10*3/uL (ref 0.0–0.7)
Eosinophils Relative: 0.8 % (ref 0.0–5.0)
HCT: 37.7 % (ref 36.0–46.0)
Hemoglobin: 12.4 g/dL (ref 12.0–15.0)
Lymphocytes Relative: 29.3 % (ref 12.0–46.0)
Lymphs Abs: 1.7 10*3/uL (ref 0.7–4.0)
MCHC: 32.9 g/dL (ref 30.0–36.0)
MCV: 93.6 fl (ref 78.0–100.0)
Monocytes Absolute: 0.4 10*3/uL (ref 0.1–1.0)
Monocytes Relative: 7.2 % (ref 3.0–12.0)
Neutro Abs: 3.6 10*3/uL (ref 1.4–7.7)
Neutrophils Relative %: 62.2 % (ref 43.0–77.0)
Platelets: 269 10*3/uL (ref 150.0–400.0)
RBC: 4.02 Mil/uL (ref 3.87–5.11)
RDW: 13.2 % (ref 11.5–15.5)
WBC: 5.9 10*3/uL (ref 4.0–10.5)

## 2022-04-08 LAB — LIPID PANEL
Cholesterol: 210 mg/dL — ABNORMAL HIGH (ref 0–200)
HDL: 89.1 mg/dL (ref >39.00)
LDL Cholesterol: 108 mg/dL — ABNORMAL HIGH (ref 0–99)
NonHDL: 120.97
Total CHOL/HDL Ratio: 2
Triglycerides: 66 mg/dL (ref 0.0–149.0)
VLDL: 13.2 mg/dL (ref 0.0–40.0)

## 2022-04-08 LAB — HEMOGLOBIN A1C: Hgb A1c MFr Bld: 5.9 % (ref 4.6–6.5)

## 2022-04-08 LAB — VITAMIN D 25 HYDROXY (VIT D DEFICIENCY, FRACTURES): VITD: 30.23 ng/mL (ref 30.00–100.00)

## 2022-04-08 LAB — LDL CHOLESTEROL, DIRECT: Direct LDL: 100 mg/dL

## 2022-04-08 LAB — TSH: TSH: 1.59 u[IU]/mL (ref 0.35–5.50)

## 2022-04-08 NOTE — Progress Notes (Signed)
Patient ID: Holly [name], female    DOB: [birth year]  Age: 76 y.o. MRN: [id number]  The patient is here for annual preventive examination and management of other chronic and acute problems.   The risk factors are reflected in the social history.  The roster of all physicians providing medical care to patient - is listed in the Snapshot section of the chart.  Activities of daily living:  The patient is 100% independent in all ADLs: dressing, toileting, feeding as well as independent mobility  Home safety : The patient has smoke detectors in the home. They wear seatbelts.  There are no firearms at home. There is no violence in the home.   There is no risks for hepatitis, STDs or HIV. There is no   history of blood transfusion. They have no travel history to infectious disease endemic areas of the world.  The patient has seen their dentist in the last six month. They have seen their eye doctor in the last year. They admit to slight hearing difficulty with regard to whispered voices and some television programs.  They have deferred audiologic testing in the last year.  They do not  have excessive sun exposure. Discussed the need for sun protection: hats, long sleeves and use of sunscreen if there is significant sun exposure.   Diet: the importance of a healthy diet is discussed. They do have a healthy diet.  The benefits of regular aerobic exercise were discussed. She walks 4 times per week ,  20 minutes.   Depression screen: there are no signs or vegative symptoms of depression- irritability, change in appetite, anhedonia, sadness/tearfullness.  Cognitive assessment: the patient manages all their financial and personal affairs and is actively engaged. They could relate day,date,year and events; recalled 2/3 objects at 3 minutes; performed clock-face test normally.  The following portions of the patient's history were reviewed and updated as appropriate: allergies, current medications, past family  history, past medical history,  past surgical history, past social history  and problem list.  Visual acuity was not assessed per patient preference since she has regular follow up with her ophthalmologist. Hearing and body mass index were assessed and reviewed.   During the course of the visit the patient was educated and counseled about appropriate screening and preventive services including : fall prevention , diabetes screening, nutrition counseling, colorectal cancer screening, and recommended immunizations.    CC: The primary encounter diagnosis was Long-term use of high-risk medication. Diagnoses of Hyperlipidemia, unspecified hyperlipidemia type, Impaired fasting glucose, Colon cancer screening, Living will in place, Hearing loss due to cerumen impaction, right, History of Guillain-Barre syndrome, Vitamin D deficiency, and Encounter for preventive health examination were also pertinent to this visit.  1) recurrent episodes of finger tips and toes turning blue , then white in response to prolonged exposure to cold temps  , not with contact with cold objects  . Feels cold for long periods of time,  More problematic when sedentary .  Denies claudication symptoms .     2) osteoporosis:  taking Prolia ,  T scores imporved by 2020 DEXA  . Drinks Teacher, adult education  , takes vitamin   History Holly [name] has a past medical history of Anxiety, Concussion with loss of consciousness (04/10/2017), Depression, Guillain Barr syndrome ([place]) (2005), Osteoporosis, Radial fracture (01/23/2013), and Squamous cell skin cancer (2011).   She has a past surgical history that includes Breast surgery (1980); Appendectomy (1960); Abdominal hysterectomy (1989); Wrist fracture surgery (Right, 2015); and Breast excisional biopsy (  1980).   Her family history includes Anxiety disorder in her daughter; Bipolar disorder in her sister; Cancer in her mother; Depression in her brother; Dystonia in her brother; Hypothyroidism in her sister;  Mental illness in her brother and sister; Parkinson's disease in her brother.She reports that she quit smoking about 44 years ago. Her smoking use included cigarettes. She smoked an average of .2 packs per day. She has never used smokeless tobacco. She reports that she does not drink alcohol and does not use drugs.  Outpatient Medications Prior to Visit  Medication Sig Dispense Refill   Cholecalciferol (VITAMIN D) 50 MCG (2000 UT) CAPS Take 1 capsule by mouth daily.     clorazepate (TRANXENE) 7.5 MG tablet Take 7.5 mg by mouth 3 (three) times daily.     desipramine (NORPRAMIN) 25 MG tablet Take 50 mg by mouth every morning.     zolpidem (AMBIEN CR) 12.5 MG CR tablet Take 12.5 mg by mouth at bedtime as needed for sleep.     ARIPiprazole (ABILIFY) 5 MG tablet Take 5 mg by mouth daily. (Patient not taking: Reported on 04/08/2022)     doxycycline (VIBRA-TABS) 100 MG tablet Take 1 tablet (100 mg total) by mouth 2 (two) times daily. X 7-10 days (Patient not taking: Reported on 04/08/2022) 20 tablet 0   mupirocin ointment (BACTROBAN) 2 % Apply 1 application. topically 2 (two) times daily. X 5 days to interior of nose (Patient not taking: Reported on 04/08/2022) 30 g 0   polyethylene glycol (MIRALAX / GLYCOLAX) 17 g packet Take 17 g by mouth daily as needed. (Patient not taking: Reported on 04/08/2022)     No facility-administered medications prior to visit.    Review of Systems  Patient denies headache, fevers, malaise, unintentional weight loss, skin rash, eye pain, sinus congestion and sinus pain, sore throat, dysphagia,  hemoptysis , cough, dyspnea, wheezing, chest pain, palpitations, orthopnea, edema, abdominal pain, nausea, melena, diarrhea, constipation, flank pain, dysuria, hematuria, urinary  Frequency, nocturia, numbness, tingling, seizures,  Focal weakness, Loss of consciousness,  Tremor, insomnia, depression, anxiety, and suicidal ideation.    Objective:  BP 132/76 (BP Location: Left Arm,  Patient Position: Sitting, Cuff Size: Normal)   Pulse 77   Temp 97.9 F (36.6 C) (Oral)   Ht _0  (1.676 m)   Wt 166 lb 6.4 oz (75.5 kg)   SpO2 99%   BMI 26.86 kg/m   Physical Exam .  General appearance: alert, cooperative and appears stated age Head: Normocephalic, without obvious abnormality, atraumatic Eyes: conjunctivae/corneas clear. PERRL, EOM's intact. Fundi benign. Ears: normal TM's and external ear canals both ears Nose: Nares normal. Septum midline. Mucosa normal. No drainage or sinus tenderness. Throat: lips, mucosa, and tongue normal; teeth and gums normal Neck: no adenopathy, no carotid bruit, no JVD, supple, symmetrical, trachea midline and thyroid not enlarged, symmetric, no tenderness/mass/nodules Lungs: clear to auscultation bilaterally Breasts: normal appearance, no masses or tenderness Heart: regular rate and rhythm, S1, S2 normal, no murmur, click, rub or gallop Abdomen: soft, non-tender; bowel sounds normal; no masses,  no organomegaly Extremities: extremities normal, atraumatic, no cyanosis or edema Pulses: 2+ and symmetric Skin: Skin color, texture, turgor normal. No rashes or lesions Neurologic: Alert and oriented X 3, normal strength and tone. Normal symmetric reflexes. Normal coordination and gait.     Assessment & Plan:   Problem List Items Addressed This Visit     History of Guillain-Barre syndrome    Occurred after vaccination for flu.  For that reason she has continued to defer all recommended vaccinations       Colon cancer screening   Relevant Orders   Cologuard   Encounter for preventive health examination    age appropriate education and counseling updated, referrals for preventative services and immunizations addressed, dietary and smoking counseling addressed, most recent labs reviewed.  I have personally reviewed and have noted:   1) the patient's medical and social history 2) The pt's use of alcohol, tobacco, and illicit drugs 3) The  patient's current medications and supplements 4) Functional ability including ADL's, fall risk, home safety risk, hearing and visual impairment 5) Diet and physical activities 6) Evidence for depression or mood disorder 7) The patient's height, weight, and BMI have been recorded in the chart   I have made referrals, and provided counseling and education based on review of the above      Living will in place    Reported today ,  Donewith her lawyers help and registered  through the [facility] as an advanced care directive ,  Keeps the small card in her wallet s.       Hearing loss due to cerumen impaction, right    She was advised to use debrox for a few day and return for an ear cleaning       Other Visit Diagnoses     Long-term use of high-risk medication    -  Primary   Relevant Orders   CBC with Differential/Platelet (Completed)   Comp Met (CMET) (Completed)   TSH (Completed)   Hyperlipidemia, unspecified hyperlipidemia type       Relevant Orders   Lipid Profile (Completed)   Direct LDL (Completed)   Impaired fasting glucose       Relevant Orders   HgB A1c (Completed)   Comp Met (CMET) (Completed)   Vitamin D deficiency       Relevant Orders   VITAMIN D 25 Hydroxy (Vit-D Deficiency, Fractures) (Completed)       Medications Discontinued During This Encounter  Medication Reason   doxycycline (VIBRA-TABS) 100 MG tablet    mupirocin ointment (BACTROBAN) 2 %    polyethylene glycol (MIRALAX / GLYCOLAX) 17 g packet     Follow-up: Return in about 1 year (around 04/09/2023).   Crecencio Mc, MD

## 2022-04-08 NOTE — Assessment & Plan Note (Signed)
She was advised to use debrox for a few day and return for an ear cleaning

## 2022-04-08 NOTE — Assessment & Plan Note (Signed)

## 2022-04-08 NOTE — Assessment & Plan Note (Signed)
Occurred after vaccination for flu.  For that reason she has continued to defer all recommended vaccinations

## 2022-04-08 NOTE — Assessment & Plan Note (Addendum)
Reported today ,  Donewith her lawyers help and registered  through the [facility] as an advanced care directive ,  Keeps the small card in her wallet s.

## 2022-04-08 NOTE — Patient Instructions (Addendum)
Your next Prolia injection is due in March 2024  Your right ear is plugged with ear wax.  I recommend that you use 3 drops of Debrox in right ear every night for a few days., then  SCHEDULE AN EAR CLEANING IN A WEEK

## 2022-04-20 DIAGNOSIS — Z1211 Encounter for screening for malignant neoplasm of colon: Secondary | ICD-10-CM | POA: Diagnosis not present

## 2022-04-21 ENCOUNTER — Telehealth: Payer: Self-pay

## 2022-04-21 NOTE — Telephone Encounter (Signed)
noted 

## 2022-04-21 NOTE — Telephone Encounter (Signed)
Left detailed msg on pts machine in regards to her Mychart video visit with Dr. Volanda Napoleon @ 340.  I moved that appt same time same day but with Dr. Colin Benton @ the [place] location.

## 2022-04-21 NOTE — Telephone Encounter (Signed)
Pt called stating for the past 3-4 days she has had a cough and she can not sleep at night.   Pt wanted a  virtual visit  I have booked pt on 11/9 @ 3:40 with Dr. Volanda Napoleon VV.

## 2022-04-22 ENCOUNTER — Telehealth: Payer: Medicare PPO | Admitting: Family Medicine

## 2022-04-22 ENCOUNTER — Telehealth (INDEPENDENT_AMBULATORY_CARE_PROVIDER_SITE_OTHER): Payer: Medicare PPO | Admitting: Family Medicine

## 2022-04-22 DIAGNOSIS — R051 Acute cough: Secondary | ICD-10-CM | POA: Diagnosis not present

## 2022-04-22 DIAGNOSIS — R0981 Nasal congestion: Secondary | ICD-10-CM

## 2022-04-22 MED ORDER — BENZONATATE 100 MG PO CAPS: ORAL_CAPSULE | ORAL | 0 refills | Status: DC

## 2022-04-22 NOTE — Patient Instructions (Signed)
  HOME CARE TIPS:  -COVID19 testing information:   Most pharmacies also offer testing and home test kits. If the Covid19 test is positive and you desire antiviral treatment, please contact a [place] or schedule a follow up virtual visit through your primary care office or through the [employer].    -I sent the medication(s) we discussed to your pharmacy: Meds ordered this encounter  Medications   benzonatate (TESSALON PERLES) 100 MG capsule    Sig: 1-2 capsules up to twice daily as needed for cough    Dispense:  30 capsule    Refill:  0     -can use tylenol f needed for fevers, aches and pains per instructions  -nasal saline sinus rinses twice daily  -stay hydrated, drink plenty of fluids and eat small healthy meals - avoid dairy  -can take 1000 IU (1mg) Vit D3 and 100-500 mg of Vit C daily per instructions  -follow up with your doctor in 2-3 days unless improving and feeling better  -stay home while sick, except to seek medical care. If you have COVID19, you will likely be contagious for 7-10 days. Flu or Influenza is likely contagious for about 7 days. Other respiratory viral infections remain contagious for 5-10+ days depending on the virus and many other factors. Wear a good mask that fits snugly (such as N95 or KN95) if around others to reduce the risk of transmission.  It was nice to meet you today, and I really hope you are feeling better soon. I help [place] out with telemedicine visits on Tuesdays and Thursdays and am happy to help if you need a follow up virtual visit on those days. Otherwise, if you have any concerns or questions following this visit please schedule a follow up visit with your Primary Care doctor or seek care at a local urgent care clinic to avoid delays in care.    Seek in person care or schedule a follow up video visit promptly if your symptoms worsen, new concerns arise or you are not improving with treatment. Call 911 and/or  seek emergency care if your symptoms are severe or life threatening.

## 2022-04-22 NOTE — Progress Notes (Signed)
Virtual Visit via Telephone Note  I connected with Holly [name] on 04/22/22 at  3:40 PM EST by telephone and verified that I am speaking with the correct person using two identifiers.   I discussed the limitations of performing an evaluation and management service by telephone and requested permission for a phone visit. The patient expressed understanding and agreed to proceed.  Location patient:  [name] provider: work or home office Participants present for the call: patient, provider Patient did not have a visit with me in the prior 7 days to address this/these issue(s).   History of Present Illness:  Acute telemedicine visit for cough: -Onset: 3 days ago -Symptoms include: cough, headache, nasal congestion, chills -Denies:fever, CP, SOB, NVD, body aches -Has tried: zyrtec -Pertinent past medical history: see below -Pertinent medication allergies:  Allergies  Allergen Reactions   Haemophilus Influenzae Vaccines Other (See Comments)    Guillain Barre    Oxycodone-Acetaminophen Nausea And Vomiting   Cephalexin     Other reaction(s): HIVES  -COVID-19 vaccine status: Immunization History  Administered Date(s) Administered   PFIZER Comirnaty(Gray [place])Covid-19 Tri-Sucrose Vaccine 10/14/2020, 11/04/2020   Td 06/14/2006      Past Medical History:  Diagnosis Date   Anxiety    Concussion with loss of consciousness 04/10/2017   Depression    Guillain Barr syndrome ([place]) 2005   Osteoporosis    Radial fracture 01/23/2013   Right arm just proximal to wrist. Secondary to fall. Patient has a history of osteoporosis and requires vitamin D and repeat DEXA scan if possible.    Squamous cell skin cancer 2011    Current Outpatient Medications on File Prior to Visit  Medication Sig Dispense Refill   Cholecalciferol (VITAMIN D) 50 MCG (2000 UT) CAPS Take 1 capsule by mouth daily.     clorazepate (TRANXENE) 7.5 MG tablet Take 7.5 mg by mouth 3 (three) times daily.     desipramine  (NORPRAMIN) 25 MG tablet Take 50 mg by mouth every morning.     zolpidem (AMBIEN CR) 12.5 MG CR tablet Take 12.5 mg by mouth at bedtime as needed for sleep.     No current facility-administered medications on file prior to visit.    Observations/Objective: Patient sounds cheerful and well on the phone. I do not appreciate any SOB. Speech and thought processing are grossly intact. Patient reported vitals:  Assessment and Plan:  Nasal congestion  Acute cough  -we discussed possible serious and likely etiologies, options for evaluation and workup, limitations of telemedicine visit vs in person visit, treatment, treatment risks and retur/follow up precautions. Pt prefers to treat via telemedicine empirically rather than in person at this moment. Query VURI, possibly covid vs other. She has opted to do home covid test. Advised if positive - could call back today before my shift is over, or could contact a [place] pharmacy or do a f/u virtual visit. Also, opted for Tessalon rx for cough and nasal saline sinus rinse.   Advised to seek prompt virtual visit or in person care if worsening, new symptoms arise, or if is not improving with treatment as expected per our conversation of expected course. Discussed options for follow up care. Did let this patient know that I do telemedicine on Tuesdays and Thursdays for [place] and those are the days I am logged into the system. Advised to schedule follow up visit with PCP, [place] virtual visits or UCC if any further questions or concerns to avoid delays in care.   I  discussed the assessment and treatment plan with the patient. The patient was provided an opportunity to ask questions and all were answered. The patient agreed with the plan and demonstrated an understanding of the instructions.    Follow Up Instructions:  I did not refer this patient for an OV with me in the next 24 hours for this/these issue(s).  I discussed the assessment and  treatment plan with the patient. The patient was provided an opportunity to ask questions and all were answered. The patient agreed with the plan and demonstrated an understanding of the instructions.   I spent 18 minutes on the date of this visit in the care of this patient. See summary of tasks completed to properly care for this patient in the detailed notes above which also included counseling of above, review of PMH, medications, allergies, evaluation of the patient and ordering and/or  instructing patient on testing and care options.     [name], DO

## 2022-04-27 ENCOUNTER — Telehealth (INDEPENDENT_AMBULATORY_CARE_PROVIDER_SITE_OTHER): Payer: Medicare PPO | Admitting: Family Medicine

## 2022-04-27 DIAGNOSIS — R059 Cough, unspecified: Secondary | ICD-10-CM

## 2022-04-27 DIAGNOSIS — R0981 Nasal congestion: Secondary | ICD-10-CM | POA: Diagnosis not present

## 2022-04-27 MED ORDER — DOXYCYCLINE HYCLATE 100 MG PO TABS: 100.0000 mg | ORAL_TABLET | Freq: Two times a day (BID) | ORAL | 0 refills | Status: DC

## 2022-04-27 NOTE — Patient Instructions (Signed)
-  I sent the medication(s) we discussed to your pharmacy: Meds ordered this encounter  Medications   doxycycline (VIBRA-TABS) 100 MG tablet    Sig: Take 1 tablet (100 mg total) by mouth 2 (two) times daily.    Dispense:  14 tablet    Refill:  0     I hope you are feeling better soon!  Seek in person care promptly if your symptoms worsen, new concerns arise or you are not improving with treatment.  It was nice to meet you today. I help [place] out with telemedicine visits on Tuesdays and Thursdays and am happy to help if you need a virtual follow up visit on those days. Otherwise, if you have any concerns or questions following this visit please schedule a follow up visit with your Primary Care office or seek care at a local urgent care clinic to avoid delays in care. If you are having severe or life threatening symptoms please call 911 and/or go to the nearest emergency room.

## 2022-04-27 NOTE — Progress Notes (Signed)
Virtual Visit via Video Note  I connected with Holly [name]  on 04/27/22 at  6:20 PM EST by a video enabled telemedicine application and verified that I am speaking with the correct person using two identifiers.  Location patient: [place] Location provider:work or home office Persons participating in the virtual visit: patient, provider  I discussed the limitations and requested verbal permission for telemedicine visit. The patient expressed understanding and agreed to proceed.   HPI:  Acute telemedicine visit for cough and congestion: -Onset: about 10 days ago -Symptoms include: started with flu like symptoms, now with persistent cough and is coughing up thick yellow and green, has developed thick yellow nasal sinus congestion, occ feels like has wheeze -Denies:CP, SOB, fevers, NVD, body aches -drinking plenty of fluids -Has tried:the tessalon given at a visit last week -Pertinent past medical history: see below -Pertinent medication allergies: Allergies  Allergen Reactions   Haemophilus Influenzae Vaccines Other (See Comments)    Guillain Barre    Oxycodone-Acetaminophen Nausea And Vomiting   Cephalexin     Other reaction(s): HIVES   -COVID-19 vaccine status:  Immunization History  Administered Date(s) Administered   PFIZER Comirnaty([place])Covid-19 Tri-Sucrose Vaccine 10/14/2020, 11/04/2020   Td 06/14/2006     ROS: See pertinent positives and negatives per HPI.  Past Medical History:  Diagnosis Date   Anxiety    Concussion with loss of consciousness 04/10/2017   Depression    Guillain Barr syndrome [facility]) 2005   Osteoporosis    Radial fracture 01/23/2013   Right arm just proximal to wrist. Secondary to fall. Patient has a history of osteoporosis and requires vitamin D and repeat DEXA scan if possible.    Squamous cell skin cancer 2011    Past Surgical History:  Procedure Laterality Date   ABDOMINAL HYSTERECTOMY  1989   APPENDECTOMY  1960   BREAST EXCISIONAL BIOPSY  1980    benign   BREAST SURGERY  1980   biopsy   WRIST FRACTURE SURGERY Right 2015     Current Outpatient Medications:    doxycycline (VIBRA-TABS) 100 MG tablet, Take 1 tablet (100 mg total) by mouth 2 (two) times daily., Disp: 14 tablet, Rfl: 0   benzonatate (TESSALON PERLES) 100 MG capsule, 1-2 capsules up to twice daily as needed for cough, Disp: 30 capsule, Rfl: 0   Cholecalciferol (VITAMIN D) 50 MCG (2000 UT) CAPS, Take 1 capsule by mouth daily., Disp: , Rfl:    clorazepate (TRANXENE) 7.5 MG tablet, Take 7.5 mg by mouth 3 (three) times daily., Disp: , Rfl:    desipramine (NORPRAMIN) 25 MG tablet, Take 50 mg by mouth every morning., Disp: , Rfl:    zolpidem (AMBIEN CR) 12.5 MG CR tablet, Take 12.5 mg by mouth at bedtime as needed for sleep., Disp: , Rfl:   EXAM:  VITALS per patient if applicable:  GENERAL: alert, oriented, appears well and in no acute distress  HEENT: atraumatic, conjunttiva clear, no obvious abnormalities on inspection of external nose and ears  NECK: normal movements of the head and neck  LUNGS: on inspection no signs of respiratory distress, breathing rate appears normal, no obvious gross SOB, gasping or wheezing  CV: no obvious cyanosis  MS: moves all visible extremities without noticeable abnormality  PSYCH/NEURO: pleasant and cooperative, no obvious depression or anxiety, speech and thought processing grossly intact  ASSESSMENT AND PLAN:  Discussed the following assessment and plan:  Nasal congestion  Cough, unspecified type  -we discussed possible serious and likely etiologies, options for  evaluation and workup, limitations of telemedicine visit vs in person visit, treatment, treatment risks and precautions. Pt is agreeable to treatment via telemedicine at this moment. Query developed/developing bacterial resp infection. She has opted for a course of doxy. Sent rx.  Advised to seek prompt virtual visit or in person care if worsening, new symptoms  arise, or if is not improving with treatment as expected per our conversation of expected course. Discussed options for follow up care. Did let this patient know that I do telemedicine on Tuesdays and Thursdays for [place] and those are the days I am logged into the system. Advised to schedule follow up visit with PCP, [place] virtual visits or UCC if any further questions or concerns to avoid delays in care.   I discussed the assessment and treatment plan with the patient. The patient was provided an opportunity to ask questions and all were answered. The patient agreed with the plan and demonstrated an understanding of the instructions.     Holly Kern, DO

## 2022-04-28 ENCOUNTER — Telehealth: Payer: Self-pay | Admitting: Internal Medicine

## 2022-04-28 LAB — COLOGUARD: COLOGUARD: NEGATIVE

## 2022-04-28 MED ORDER — AZITHROMYCIN 500 MG PO TABS: 500.0000 mg | ORAL_TABLET | Freq: Every day | ORAL | 0 refills | Status: DC

## 2022-04-28 NOTE — Telephone Encounter (Signed)
Patient had called the office this morning before office opened. She had a virtual with Colin Benton yesterday, Maudie Mercury is not in the office today. Kim had prescribed doxycycline (VIBRA-TABS) 100 MG tablet for patient. Patient took it last night around 7:20pm, within 10 minutes she was running to the bathroom to throw up. After that she did not eat anything and went to bed. Patient slept good last night. After hours nurse told patient to call her provider to see if a change in medication needs to be done or to stop.

## 2022-04-28 NOTE — Addendum Note (Signed)
Addended by: Crecencio Mc on: 04/28/2022 10:59 AM   Modules accepted: Orders

## 2022-04-28 NOTE — Telephone Encounter (Signed)
Spoke with pt and she stated that she was advised to take the medication on an empty stomach. Explained to pt that is way she got sick probably,  however pt stated that she will go pick up the new antibiotic because she doesn't want to get sick again.

## 2022-04-29 ENCOUNTER — Ambulatory Visit
Admission: RE | Admit: 2022-04-29 | Discharge: 2022-04-29 | Disposition: A | Discharge status: Home / Self Care | Payer: Medicare PPO | Source: Ambulatory Visit | Attending: Internal Medicine | Admitting: Internal Medicine

## 2022-04-29 DIAGNOSIS — Z1231 Encounter for screening mammogram for malignant neoplasm of breast: Secondary | ICD-10-CM | POA: Diagnosis not present

## 2022-05-26 DIAGNOSIS — F330 Major depressive disorder, recurrent, mild: Secondary | ICD-10-CM | POA: Diagnosis not present

## 2022-06-30 ENCOUNTER — Encounter: Payer: Self-pay | Admitting: Nurse Practitioner

## 2022-06-30 ENCOUNTER — Ambulatory Visit: Payer: Medicare PPO | Admitting: Nurse Practitioner

## 2022-06-30 VITALS — BP 118/60 | HR 73 | Temp 97.6°F | Ht 66.0 in | Wt 162.0 lb

## 2022-06-30 DIAGNOSIS — F330 Major depressive disorder, recurrent, mild: Secondary | ICD-10-CM | POA: Diagnosis not present

## 2022-06-30 DIAGNOSIS — L299 Pruritus, unspecified: Secondary | ICD-10-CM | POA: Diagnosis not present

## 2022-06-30 DIAGNOSIS — W57XXXA Bitten or stung by nonvenomous insect and other nonvenomous arthropods, initial encounter: Secondary | ICD-10-CM | POA: Insufficient documentation

## 2022-06-30 DIAGNOSIS — F341 Dysthymic disorder: Secondary | ICD-10-CM | POA: Diagnosis not present

## 2022-06-30 DIAGNOSIS — F4312 Post-traumatic stress disorder, chronic: Secondary | ICD-10-CM | POA: Diagnosis not present

## 2022-06-30 DIAGNOSIS — F419 Anxiety disorder, unspecified: Secondary | ICD-10-CM | POA: Diagnosis not present

## 2022-06-30 MED ORDER — PREDNISONE 10 MG PO TABS: ORAL_TABLET | ORAL | 0 refills | Status: DC

## 2022-06-30 MED ORDER — HYDROXYZINE PAMOATE 25 MG PO CAPS: 25.0000 mg | ORAL_CAPSULE | Freq: Three times a day (TID) | ORAL | 0 refills | Status: DC | PRN

## 2022-06-30 NOTE — Assessment & Plan Note (Signed)
Pruritic, urticarial rash noted on exam, consistent with chiggers. Will treat with oral Prednisone instead of topical due to number of bites and widespread area. Encouraged patient to return if flu-like symptoms develop.

## 2022-06-30 NOTE — Patient Instructions (Signed)
It was nice to meet you.   I am sending 2 prescriptions to your pharmacy.   Please let me know if the area does not improve or you begin to develop other symptoms.

## 2022-06-30 NOTE — Assessment & Plan Note (Signed)
Will use Hydroxyzine to help with itching. Advised patient side effects include drowsiness. Discussed using ice to also help with itching. Encouraged patient to do her best to avoid scratching area.

## 2022-06-30 NOTE — Progress Notes (Signed)
Holly Morrow, NP-C Phone: [phone]  Holly [name] is a 77 y.o. female who presents today for insect bites on her legs x 1 week.  Patient recently traveled to [place], [place] and was bitten multiple times on her bilateral lower legs and ankles buy an unknown insect. She reports 30+ insect bites. Reports they are very itchy and inflamed. States they resemble chigger bites. Symptoms worse with heat. She did have bites on her hands and arms that have resolved. She denies fever, chills, headache, joint pain and fatigue. She has not tried anything over the counter.   Social History   Tobacco Use  Smoking Status Former   Packs/day: 0.20   Types: Cigarettes   Quit date: 06/14/1977   Years since quitting: 45.0  Smokeless Tobacco Never    Current Outpatient Medications on File Prior to Visit  Medication Sig Dispense Refill   Cholecalciferol (VITAMIN D) 50 MCG (2000 UT) CAPS Take 1 capsule by mouth daily.     clorazepate (TRANXENE) 7.5 MG tablet Take 7.5 mg by mouth 3 (three) times daily.     desipramine (NORPRAMIN) 25 MG tablet Take 50 mg by mouth every morning.     zolpidem (AMBIEN CR) 12.5 MG CR tablet Take 12.5 mg by mouth at bedtime as needed for sleep.     azithromycin (ZITHROMAX) 500 MG tablet Take 1 tablet (500 mg total) by mouth daily. (Patient not taking: Reported on 06/30/2022) 7 tablet 0   benzonatate (TESSALON PERLES) 100 MG capsule 1-2 capsules up to twice daily as needed for cough (Patient not taking: Reported on 06/30/2022) 30 capsule 0   doxycycline (VIBRA-TABS) 100 MG tablet Take 1 tablet (100 mg total) by mouth 2 (two) times daily. (Patient not taking: Reported on 06/30/2022) 14 tablet 0   No current facility-administered medications on file prior to visit.     ROS see history of present illness  Objective  Physical Exam Vitals:   06/30/22 0842  BP: 118/60  Pulse: 73  Temp: 97.6 F (36.4 C)  SpO2: 99%    BP Readings  from Last 3 Encounters:  06/30/22 118/60  04/08/22 132/76  09/08/21 124/70   Wt Readings from Last 3 Encounters:  06/30/22 162 lb (73.5 kg)  04/08/22 166 lb 6.4 oz (75.5 kg)  02/05/22 172 lb (78 kg)    Physical Exam Constitutional:      General: She is not in acute distress.    Appearance: Normal appearance.  HENT:     Head: Normocephalic.  Cardiovascular:     Rate and Rhythm: Normal rate and regular rhythm.     Heart sounds: Normal heart sounds.  Pulmonary:     Effort: Pulmonary effort is normal.     Breath sounds: Normal breath sounds.  Skin:    General: Skin is warm and dry.     Findings: Rash present. Rash is papular and urticarial.     Comments: Multiple (30+) small, inflamed bump like areas noted on bilateral ankles and lower legs. Erythema present. Right leg worse than left. Non-blanching.   Neurological:     Mental Status: She is alert.  Psychiatric:        Mood and Affect: Mood normal.        Behavior: Behavior normal.    Assessment/Plan: Please see individual problem list.  Pruritus Assessment & Plan: Will use Hydroxyzine to help with itching. Advised patient side effects include drowsiness. Discussed using ice to also help with  itching. Encouraged patient to do her best to avoid scratching area.   Orders: -     hydrOXYzine Pamoate; Take 1 capsule (25 mg total) by mouth every 8 (eight) hours as needed. For itching.  Dispense: 30 capsule; Refill: 0  Multiple insect bites Assessment & Plan: Pruritic, urticarial rash noted on exam, consistent with chiggers. Will treat with oral Prednisone instead of topical due to number of bites and widespread area. Encouraged patient to return if flu-like symptoms develop.   Orders: -     predniSONE; TAKE 3 TABLETS PO QD FOR 3 DAYS THEN TAKE 2 TABLETS PO QD FOR 3 DAYS THEN TAKE 1 TABLET PO QD FOR 3 DAYS THEN TAKE 1/2 TAB PO QD FOR 3 DAYS  Dispense: 20 tablet; Refill: 0   Return if symptoms worsen or fail to  improve.   Holly Morrow, NP-C [place]

## 2022-07-13 DIAGNOSIS — F330 Major depressive disorder, recurrent, mild: Secondary | ICD-10-CM | POA: Diagnosis not present

## 2022-07-20 ENCOUNTER — Telehealth: Payer: Self-pay | Admitting: *Deleted

## 2022-07-20 NOTE — Telephone Encounter (Signed)
$  0 due, PA required (will send to PA team)  Sent mychart to patient to schedule Prolia on or after 08/18/22.

## 2022-07-23 ENCOUNTER — Other Ambulatory Visit (HOSPITAL_COMMUNITY): Payer: Self-pay

## 2022-07-23 NOTE — Telephone Encounter (Signed)
Pharmacy Patient Advocate Encounter   Received notification that prior authorization for Prolia is required/requested.  Per Test Claim: $64.00 with pharmacy benefit   PA submitted on 07/23/22 to (ins) [employer] via CoverMyMeds Key [id number] Status is pending

## 2022-07-26 ENCOUNTER — Other Ambulatory Visit (HOSPITAL_COMMUNITY): Payer: Self-pay

## 2022-07-26 NOTE — Telephone Encounter (Signed)
Pharmacy Patient Advocate Encounter  Prior Authorization for Holly [name] has been approved.    PA# [id number] Effective dates: 07/27/2019 through 06/14/2023  Selinda Orion CPhT Rx Patient Advocate LETTER OF APPOVAL IN MEDIA

## 2022-08-04 DIAGNOSIS — F4312 Post-traumatic stress disorder, chronic: Secondary | ICD-10-CM | POA: Diagnosis not present

## 2022-08-04 DIAGNOSIS — F419 Anxiety disorder, unspecified: Secondary | ICD-10-CM | POA: Diagnosis not present

## 2022-08-04 DIAGNOSIS — F330 Major depressive disorder, recurrent, mild: Secondary | ICD-10-CM | POA: Diagnosis not present

## 2022-08-04 DIAGNOSIS — F341 Dysthymic disorder: Secondary | ICD-10-CM | POA: Diagnosis not present

## 2022-08-09 DIAGNOSIS — F332 Major depressive disorder, recurrent severe without psychotic features: Secondary | ICD-10-CM | POA: Diagnosis not present

## 2022-08-09 DIAGNOSIS — F341 Dysthymic disorder: Secondary | ICD-10-CM | POA: Diagnosis not present

## 2022-08-09 DIAGNOSIS — F4312 Post-traumatic stress disorder, chronic: Secondary | ICD-10-CM | POA: Diagnosis not present

## 2022-08-09 DIAGNOSIS — F419 Anxiety disorder, unspecified: Secondary | ICD-10-CM | POA: Diagnosis not present

## 2022-08-09 NOTE — Telephone Encounter (Signed)
Pt scheduled for Prolia on 08/26/22

## 2022-08-17 DIAGNOSIS — F419 Anxiety disorder, unspecified: Secondary | ICD-10-CM | POA: Diagnosis not present

## 2022-08-17 DIAGNOSIS — F4312 Post-traumatic stress disorder, chronic: Secondary | ICD-10-CM | POA: Diagnosis not present

## 2022-08-17 DIAGNOSIS — F330 Major depressive disorder, recurrent, mild: Secondary | ICD-10-CM | POA: Diagnosis not present

## 2022-08-17 DIAGNOSIS — F341 Dysthymic disorder: Secondary | ICD-10-CM | POA: Diagnosis not present

## 2022-08-25 ENCOUNTER — Ambulatory Visit: Payer: Medicare PPO | Admitting: Internal Medicine

## 2022-08-25 ENCOUNTER — Encounter: Payer: Self-pay | Admitting: Internal Medicine

## 2022-08-25 VITALS — BP 150/96 | HR 79 | Temp 97.9°F | Ht 66.0 in | Wt 168.6 lb

## 2022-08-25 DIAGNOSIS — R432 Parageusia: Secondary | ICD-10-CM

## 2022-08-25 DIAGNOSIS — Z515 Encounter for palliative care: Secondary | ICD-10-CM | POA: Insufficient documentation

## 2022-08-25 DIAGNOSIS — G2581 Restless legs syndrome: Secondary | ICD-10-CM | POA: Diagnosis not present

## 2022-08-25 DIAGNOSIS — R430 Anosmia: Secondary | ICD-10-CM

## 2022-08-25 NOTE — Assessment & Plan Note (Addendum)
She has requested DNR order as she is concerned that her daughter would make poor decisions on her behalf based on self interest.  DNR AND MOST FORM DISCUSSED AND SIGNED. Patient has history of depression but is actively engaged with friends and certain family members.  She has no signs of passive suicidal ideation. . ORIGINAL GIVEN TO PATIENT

## 2022-08-25 NOTE — Progress Notes (Signed)
Subjective:  Patient ID: Holly [name], female    DOB: 12/12/1945  Age: 77 y.o. 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 Diagnoses of End of life care, Restless legs, and Dysgeusia were also pertinent to this visit.   HPI Holly [name] presents for follow up on multiple issues   Chief Complaint  Patient presents with   lack of smell and taste    Pt states that for the last several weeks she has had a lack of taste and smell. She stated that she has a constant burning sensation on her tongue and metallic taste in her mouth.    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 .  She also notes an occasional metallic taste in mouth.  She is unsure when the change in taste and smell started, and denies vision changes,  headaches,  and dysphagia.   No sinus congestion or discharge.  No facial weakness .  Had a n upper respiratory viral illness in October,  took doxycycline for the illness but only one dose,  then completed a course of azithromycin.    She notes that when she visited [place]  several months ago, she did not have much of an appetite, avoided fresh vegetables/fruits  for fear of infection  \ 2) Desire for DNR/MOST orders.   Outpatient Medications Prior to Visit  Medication Sig Dispense Refill   Cholecalciferol (VITAMIN D) 50 MCG (2000 UT) CAPS Take 1 capsule by mouth daily.     clorazepate (TRANXENE) 7.5 MG tablet Take 7.5 mg by mouth 3 (three) times daily.     desipramine (NORPRAMIN) 25 MG tablet Take 50 mg by mouth every morning.     hydrOXYzine (VISTARIL) 25 MG capsule Take 1 capsule (25 mg total) by mouth every 8 (eight) hours as needed. For itching.  30 capsule 0   zolpidem (AMBIEN CR) 12.5 MG CR tablet Take 12.5 mg by mouth at bedtime as needed for sleep.     azithromycin (ZITHROMAX) 500 MG tablet Take 1 tablet (500 mg total) by mouth daily. (Patient not taking: Reported on 06/30/2022) 7 tablet 0   benzonatate (TESSALON PERLES) 100 MG capsule 1-2 capsules up to twice daily as needed for cough (Patient not taking: Reported on 06/30/2022) 30 capsule 0   doxycycline (VIBRA-TABS) 100 MG tablet Take 1 tablet (100 mg total) by mouth 2 (two) times daily. (Patient not taking: Reported on 06/30/2022) 14 tablet 0   predniSONE (DELTASONE) 10 MG tablet TAKE 3 TABLETS PO QD FOR 3 DAYS THEN TAKE 2 TABLETS PO QD FOR 3 DAYS THEN TAKE 1 TABLET PO QD FOR 3 DAYS THEN TAKE 1/2 TAB PO QD FOR 3 DAYS (Patient not taking: Reported on 08/25/2022) 20 tablet 0   No facility-administered medications prior to visit.    Review of Systems;  Patient denies headache, fevers, malaise, unintentional weight loss, skin rash, eye pain, sinus congestion and sinus pain, sore throat, dysphagia,  hemoptysis , cough, dyspnea, wheezing, chest pain, palpitations, orthopnea, edema, abdominal pain, nausea, melena, diarrhea, constipation, flank pain, dysuria, hematuria, urinary  Frequency, nocturia, numbness, tingling, seizures,  Focal weakness, Loss of consciousness,  Tremor, insomnia, untreated depression, anxiety, and suicidal ideation.      Objective:  BP (!) 150/96   Pulse 79   Temp 97.9 F (36.6 C) (Oral)   Ht 5\' 6"  (1.676 m)   Wt 168 lb 9.6 oz (76.5 kg)   SpO2 99%   BMI 27.21 kg/m   BP Readings from Last 3 Encounters:  08/25/22 (!) 150/96  06/30/22 118/60  04/08/22 132/76    Wt Readings from Last 3 Encounters:  08/25/22 168 lb 9.6 oz (76.5 kg)  06/30/22 162 lb (73.5 kg)  04/08/22 166 lb 6.4 oz (75.5 kg)    Physical Exam Vitals reviewed.  Constitutional:      General: She is not in acute distress.    Appearance: Normal appearance. She is normal weight. She is not  ill-appearing, toxic-appearing or diaphoretic.  HENT:     Head: Normocephalic.  Eyes:     General: No scleral icterus.       Right eye: No discharge.        Left eye: No discharge.     Conjunctiva/sclera: Conjunctivae normal.  Cardiovascular:     Rate and Rhythm: Normal rate and regular rhythm.     Heart sounds: Normal heart sounds.  Pulmonary:     Effort: Pulmonary effort is normal. No respiratory distress.     Breath sounds: Normal breath sounds.  Musculoskeletal:        General: Normal range of motion.  Skin:    General: Skin is warm and dry.  Neurological:     General: No focal deficit present.     Mental Status: She is alert and oriented to person, place, and time. Mental status is at baseline.  Psychiatric:        Mood and Affect: Mood and affect normal.        Speech: Speech normal.        Behavior: Behavior normal.        Thought Content: Thought content normal.        Cognition and Memory: Cognition and memory normal.        Judgment: Judgment normal.    Lab Results  Component Value Date   HGBA1C 5.9 04/08/2022   HGBA1C 5.9 12/03/2020    Lab Results  Component Value Date   CREATININE 0.89 04/08/2022   CREATININE 0.94 12/03/2020   CREATININE 0.98 06/11/2020    Lab Results  Component Value Date   WBC 5.9 04/08/2022   HGB 12.4 04/08/2022   HCT 37.7 04/08/2022   PLT 269.0 04/08/2022   GLUCOSE 87 04/08/2022   CHOL 210 (H) 04/08/2022   TRIG 66.0 04/08/2022   HDL 89.10 04/08/2022   LDLDIRECT 100.0 04/08/2022   LDLCALC 108 (H) 04/08/2022   ALT 11 04/08/2022   AST 14 04/08/2022   NA 138 04/08/2022   K 4.0 04/08/2022   CL 100 04/08/2022   CREATININE 0.89 04/08/2022   BUN 19 04/08/2022   CO2 29 04/08/2022   TSH 1.59 04/08/2022   INR 0.9 12/24/2012   HGBA1C 5.9 04/08/2022    MM 3D SCREEN BREAST BILATERAL  Result Date: 05/03/2022 CLINICAL DATA:  Screening. EXAM: DIGITAL SCREENING BILATERAL MAMMOGRAM WITH TOMOSYNTHESIS AND CAD TECHNIQUE: Bilateral  screening digital craniocaudal and mediolateral oblique mammograms were obtained. Bilateral screening digital breast tomosynthesis was performed. The images were evaluated with computer-aided detection. COMPARISON:  Previous exam(s). ACR Breast Density Category b: There are scattered areas of fibroglandular density. FINDINGS: There are no  findings suspicious for malignancy. IMPRESSION: No mammographic evidence of malignancy. A result letter of this screening mammogram will be mailed directly to the patient. RECOMMENDATION: Screening mammogram in one year. (Code:SM-B-01Y) BI-RADS CATEGORY  1: Negative. Electronically Signed   By: Ammie Ferrier M.D.   On: 05/03/2022 12:54    Assessment & Plan:   Anosmia  End of life care Assessment & Plan: She has requested DNR order as she is concerned that her daughter would make poor decisions on her behalf based on self interest.  DNR AND MOST FORM DISCUSSED AND SIGNED. Patient has history of depression but is actively engaged with friends and certain family members.  She has no signs of passive suicidal ideation. . ORIGINAL GIVEN TO PATIENT   Orders: -     Do not attempt resuscitation (DNR)  Restless legs  Dysgeusia Assessment & Plan: Associated with anosmia.  Given her recent URI and lack of other cranial nerve deficits or current symptoms of acute sinusitis,  advised to continue to monitor for now. If symptoms do not resolve by end of April,  ENT/neurology evaluation recommended.        Follow-up: Return in about 3 months (around 11/25/2022).   Crecencio Mc, MD

## 2022-08-25 NOTE — Patient Instructions (Signed)
Lab Results  Component Value Date   TSH 1.59 04/08/2022

## 2022-08-26 ENCOUNTER — Ambulatory Visit (INDEPENDENT_AMBULATORY_CARE_PROVIDER_SITE_OTHER): Payer: Medicare PPO

## 2022-08-26 DIAGNOSIS — M810 Age-related osteoporosis without current pathological fracture: Secondary | ICD-10-CM | POA: Diagnosis not present

## 2022-08-26 MED ORDER — DENOSUMAB 60 MG/ML ~~LOC~~ SOSY
60.0000 mg | PREFILLED_SYRINGE | Freq: Once | SUBCUTANEOUS | Status: AC
  Administered 2022-08-26: 60 mg via SUBCUTANEOUS

## 2022-08-26 NOTE — Progress Notes (Signed)
Holly [name] presents today for injection per MD orders. Prolia injection administered SQ in left Upper Arm. Administration without incident. Patient tolerated well. Greyson Riccardi,cma

## 2022-08-28 DIAGNOSIS — R430 Anosmia: Secondary | ICD-10-CM | POA: Insufficient documentation

## 2022-08-28 DIAGNOSIS — R432 Parageusia: Secondary | ICD-10-CM | POA: Insufficient documentation

## 2022-08-28 NOTE — Assessment & Plan Note (Signed)
Associated with anosmia.  Given her recent URI and lack of other cranial nerve deficits or current symptoms of acute sinusitis,  advised to continue to monitor for now. If symptoms do not resolve by end of April,  ENT/neurology evaluation recommended.

## 2022-09-21 DIAGNOSIS — F4312 Post-traumatic stress disorder, chronic: Secondary | ICD-10-CM | POA: Diagnosis not present

## 2022-09-21 DIAGNOSIS — F419 Anxiety disorder, unspecified: Secondary | ICD-10-CM | POA: Diagnosis not present

## 2022-09-21 DIAGNOSIS — F330 Major depressive disorder, recurrent, mild: Secondary | ICD-10-CM | POA: Diagnosis not present

## 2022-10-06 DIAGNOSIS — F332 Major depressive disorder, recurrent severe without psychotic features: Secondary | ICD-10-CM | POA: Diagnosis not present

## 2022-10-08 DIAGNOSIS — H35373 Puckering of macula, bilateral: Secondary | ICD-10-CM | POA: Diagnosis not present

## 2022-10-08 DIAGNOSIS — H35379 Puckering of macula, unspecified eye: Secondary | ICD-10-CM | POA: Diagnosis not present

## 2022-10-08 DIAGNOSIS — H2513 Age-related nuclear cataract, bilateral: Secondary | ICD-10-CM | POA: Diagnosis not present

## 2022-10-08 DIAGNOSIS — Z0100 Encounter for examination of eyes and vision without abnormal findings: Secondary | ICD-10-CM | POA: Diagnosis not present

## 2022-10-08 DIAGNOSIS — H04223 Epiphora due to insufficient drainage, bilateral lacrimal glands: Secondary | ICD-10-CM | POA: Diagnosis not present

## 2022-10-20 DIAGNOSIS — F419 Anxiety disorder, unspecified: Secondary | ICD-10-CM | POA: Diagnosis not present

## 2022-10-20 DIAGNOSIS — F4312 Post-traumatic stress disorder, chronic: Secondary | ICD-10-CM | POA: Diagnosis not present

## 2022-10-20 DIAGNOSIS — F330 Major depressive disorder, recurrent, mild: Secondary | ICD-10-CM | POA: Diagnosis not present

## 2023-01-04 DIAGNOSIS — F330 Major depressive disorder, recurrent, mild: Secondary | ICD-10-CM | POA: Diagnosis not present

## 2023-01-10 DIAGNOSIS — M25562 Pain in left knee: Secondary | ICD-10-CM | POA: Diagnosis not present

## 2023-01-10 DIAGNOSIS — M13862 Other specified arthritis, left knee: Secondary | ICD-10-CM | POA: Diagnosis not present

## 2023-01-12 ENCOUNTER — Encounter (INDEPENDENT_AMBULATORY_CARE_PROVIDER_SITE_OTHER): Payer: Self-pay

## 2023-01-21 ENCOUNTER — Telehealth: Payer: Self-pay | Admitting: *Deleted

## 2023-01-21 NOTE — Telephone Encounter (Signed)
$  80 due for Prolia. PA on file   Appt scheduled to see Dr. Darrick Huntsman on 03/02/23 @ 11am. She will get labs & Prolia injection the same day

## 2023-01-27 ENCOUNTER — Encounter (INDEPENDENT_AMBULATORY_CARE_PROVIDER_SITE_OTHER): Payer: Self-pay

## 2023-02-01 DIAGNOSIS — F419 Anxiety disorder, unspecified: Secondary | ICD-10-CM | POA: Diagnosis not present

## 2023-02-01 DIAGNOSIS — F4312 Post-traumatic stress disorder, chronic: Secondary | ICD-10-CM | POA: Diagnosis not present

## 2023-02-08 ENCOUNTER — Ambulatory Visit (INDEPENDENT_AMBULATORY_CARE_PROVIDER_SITE_OTHER): Payer: Medicare PPO | Admitting: *Deleted

## 2023-02-08 VITALS — Ht 66.0 in | Wt 169.0 lb

## 2023-02-08 DIAGNOSIS — Z0000 Encounter for general adult medical examination without abnormal findings: Secondary | ICD-10-CM

## 2023-02-08 NOTE — Progress Notes (Signed)
Subjective:   Holly [name] is a 77 y.o. female who presents for Medicare Annual (Subsequent) preventive examination.  Visit Complete: Virtual  I connected with  [name] on 02/08/23 by a audio enabled telemedicine application and verified that I am speaking with the correct person using two identifiers.  Patient Location: Home  Provider Location: Office/Clinic  I discussed the limitations of evaluation and management by telemedicine. The patient expressed understanding and agreed to proceed.  Vital Signs: Unable to obtain new vitals due to this being a telehealth visit.  Review of Systems     Cardiac Risk Factors include: advanced age (>5men, >25 women)     Objective:    Today's Vitals   02/08/23 1118 02/08/23 1119  Weight: 169 lb (76.7 kg)   Height: 5\' 6"  (1.676 m)   PainSc:  1    Body mass index is 27.28 kg/m.     02/08/2023   11:35 AM 02/05/2022   11:55 AM 11/24/2020   11:31 AM 09/16/2020    9:28 AM 09/03/2020   11:53 AM 11/22/2019   11:27 AM 11/21/2018    2:50 PM  Advanced Directives  Does Patient Have a Medical Advance Directive? Yes Yes Yes Yes No Yes Yes  Type of Estate agent of [place];Living will Healthcare Power of [place];Living will Healthcare Power of [employer] of [place];Living will  Healthcare Power of [place];Living will Healthcare Power of [place];Living will  Does patient want to make changes to medical advance directive? No - Patient declined No - Patient declined No - Patient declined No - Patient declined  No - Patient declined No - Patient declined  Copy of Healthcare Power of Attorney in Chart? Yes - validated most recent copy scanned in chart (See row information) No - copy requested No - copy requested Yes - validated most recent copy scanned in chart (See row information)  No - copy requested No - copy requested  Would patient like information on creating a medical advance directive?    No - Patient  declined No - Patient declined      Current Medications (verified) Outpatient Encounter Medications as of 02/08/2023  Medication Sig   Cholecalciferol (VITAMIN D) 50 MCG (2000 UT) CAPS Take 1 capsule by mouth daily.   clorazepate (TRANXENE) 7.5 MG tablet Take 7.5 mg by mouth 3 (three) times daily.   desipramine (NORPRAMIN) 25 MG tablet Take 50 mg by mouth every morning.   hydrOXYzine (VISTARIL) 25 MG capsule Take 1 capsule (25 mg total) by mouth every 8 (eight) hours as needed. For itching. (Patient not taking: Reported on 02/08/2023)   zolpidem (AMBIEN CR) 12.5 MG CR tablet Take 12.5 mg by mouth at bedtime as needed for sleep. (Patient not taking: Reported on 02/08/2023)   No facility-administered encounter medications on file as of 02/08/2023.    Allergies (verified) Haemophilus influenzae vaccines, Oxycodone-acetaminophen, and Cephalexin   History: Past Medical History:  Diagnosis Date   Anxiety    Concussion with loss of consciousness 04/10/2017   Depression    Guillain Barr syndrome (HCC) 2005   Osteoporosis    Radial fracture 01/23/2013   Right arm just proximal to wrist. Secondary to fall. Patient has a history of osteoporosis and requires vitamin D and repeat DEXA scan if possible.    Squamous cell skin cancer 2011   Past Surgical History:  Procedure Laterality Date   ABDOMINAL HYSTERECTOMY  1989   APPENDECTOMY  1960   BREAST EXCISIONAL BIOPSY  1980   benign   BREAST SURGERY  1980   biopsy   WRIST FRACTURE SURGERY Right 2015   Family History  Problem Relation Age of Onset   Cancer Mother    Mental illness Sister    Bipolar disorder Sister    Hypothyroidism Sister    Anxiety disorder Daughter    Depression Brother    Mental illness Brother    Parkinson's disease Brother    Dystonia Brother    Breast cancer Neg Hx    Social History   Socioeconomic History   Marital status: Widowed    Spouse name: Not on file   Number of children: 1   Years of education: 61    Highest education level: 12th grade  Occupational History   Occupation: Retired  Tobacco Use   Smoking status: Former    Current packs/day: 0.00    Types: Cigarettes    Quit date: 06/14/1977    Years since quitting: 45.6   Smokeless tobacco: Never  Vaping Use   Vaping status: Never Used  Substance and Sexual Activity   Alcohol use: Never    Alcohol/week: 0.0 standard drinks of alcohol    Comment: social (about once a month)   Drug use: No   Sexual activity: Never  Other Topics Concern   Not on file  Social History Narrative   Not on file   Social Determinants of Health   Financial Resource Strain: Low Risk  (02/08/2023)   Overall Financial Resource Strain (CARDIA)    Difficulty of Paying Living Expenses: Not hard at all  Food Insecurity: No Food Insecurity (02/08/2023)   Hunger Vital Sign    Worried About Running Out of Food in the Last Year: Never true    Ran Out of Food in the Last Year: Never true  Transportation Needs: No Transportation Needs (02/08/2023)   PRAPARE - Administrator, Civil Service (Medical): No    Lack of Transportation (Non-Medical): No  Physical Activity: Insufficiently Active (02/08/2023)   Exercise Vital Sign    Days of Exercise per Week: 2 days    Minutes of Exercise per Session: 30 min  Stress: Stress Concern Present (02/08/2023)   [employer] of Occupational Health - Occupational Stress Questionnaire    Feeling of Stress : To some extent  Social Connections: Moderately Isolated (02/08/2023)   Social Connection and Isolation Panel [NHANES]    Frequency of Communication with Friends and Family: More than three times a week    Frequency of Social Gatherings with Friends and Family: Twice a week    Attends Religious Services: Never    Database administrator or Organizations: Yes    Attends Engineer, structural: More than 4 times per year    Marital Status: Widowed    Tobacco Counseling Counseling given: Not  Answered   Clinical Intake:  Pre-visit preparation completed: Yes  Pain : 0-10 Pain Score: 1  Pain Location: Knee Pain Orientation: Left Pain Descriptors / Indicators: Aching Pain Onset: More than a month ago Pain Frequency: Intermittent     BMI - recorded: 27.28 Nutritional Status: BMI 25 -29 Overweight Nutritional Risks: None Diabetes: No  How often do you need to have someone help you when you read instructions, pamphlets, or other written materials from your doctor or pharmacy?: 1 - Never  Interpreter Needed?: No  Information entered by :: R. Afia Messenger LPN   Activities of Daily Living    02/08/2023   11:24 AM  In your present state of health, do you have any difficulty performing the following activities:  Hearing? 0  Vision? 0  Comment glasses  Difficulty concentrating or making decisions? 0  Walking or climbing stairs? 0  Dressing or bathing? 0  Doing errands, shopping? 0  Preparing Food and eating ? N  Using the Toilet? N  In the past six months, have you accidently leaked urine? N  Do you have problems with loss of bowel control? N  Managing your Medications? N  Managing your Finances? N  Housekeeping or managing your Housekeeping? N    Patient Care Team: Sherlene Shams, MD as PCP - General (Internal Medicine)  Indicate any recent Medical Services you may have received from other than [facility] providers in the past year (date may be approximate).     Assessment:   This is a routine wellness examination for Bonnie.  Hearing/Vision screen Hearing Screening - Comments:: No issues Vision Screening - Comments:: glasses  Dietary issues and exercise activities discussed:     Goals Addressed             This Visit's Progress    Patient Stated       Exercise a little more and work on weight       Depression Screen    02/08/2023   11:30 AM 08/25/2022    4:05 PM 06/30/2022    8:43 AM 04/08/2022    9:43 AM 02/05/2022   11:49 AM 09/08/2021    7:32 AM  09/04/2021    3:36 PM  PHQ 2/9 Scores  PHQ - 2 Score 0 0 0 0 0 0 0  PHQ- 9 Score 0 0  0       Fall Risk    02/08/2023   11:26 AM 08/25/2022    4:04 PM 06/30/2022    8:43 AM 04/08/2022    9:42 AM 02/05/2022   11:50 AM  Fall Risk   Falls in the past year? 0 0 1 1 1   Comment     Fell on sand when walking  Number falls in past yr: 0 0 0 0 0  Injury with Fall? 0 0 0 0 0  Risk for fall due to : No Fall Risks No Fall Risks No Fall Risks No Fall Risks   Follow up Falls prevention discussed;Falls evaluation completed Falls evaluation completed Falls evaluation completed Falls evaluation completed Falls evaluation completed;Falls prevention discussed    MEDICARE RISK AT HOME: Medicare Risk at Home Any stairs in or around the home?: Yes If so, are there any without handrails?: No Home free of loose throw rugs in walkways, pet beds, electrical cords, etc?: Yes Adequate lighting in your home to reduce risk of falls?: Yes Life alert?: No Use of a cane, walker or w/c?: No Grab bars in the bathroom?: No Shower chair or bench in shower?: No Elevated toilet seat or a handicapped toilet?: Yes  Cognitive Function:    11/21/2018    3:00 PM 09/29/2017    9:12 AM 09/15/2015    9:12 AM  MMSE - Mini Mental State Exam  Not completed: Unable to complete Unable to complete   Orientation to time   5  Orientation to Place   5  Registration   3  Attention/ Calculation   5  Recall   3  Language- name 2 objects   2  Language- repeat   1  Language- follow 3 step command   3  Language- read & follow  direction   1  Write a sentence   1  Copy design   1  Total score   30        02/08/2023   11:35 AM 02/05/2022   11:53 AM  6CIT Screen  What Year? 0 points 0 points  What month? 0 points 0 points  What time? 0 points 0 points  Count back from 20 0 points 0 points  Months in reverse 0 points 0 points  Repeat phrase 0 points 0 points  Total Score 0 points 0 points    Immunizations Immunization  History  Administered Date(s) Administered   PFIZER Comirnaty(Gray Top)Covid-19 Tri-Sucrose Vaccine 10/14/2020, 11/04/2020   Td 06/14/2006    TDAP status: Due, Education has been provided regarding the importance of this vaccine. Advised may receive this vaccine at local pharmacy or Health Dept. Aware to provide a copy of the vaccination record if obtained from local pharmacy or Health Dept. Verbalized acceptance and understanding.  Flu Vaccine status: Declined, Education has been provided regarding the importance of this vaccine but patient still declined. Advised may receive this vaccine at local pharmacy or Health Dept. Aware to provide a copy of the vaccination record if obtained from local pharmacy or Health Dept. Verbalized acceptance and understanding.  Pneumococcal vaccine status: Declined,  Education has been provided regarding the importance of this vaccine but patient still declined. Advised may receive this vaccine at local pharmacy or Health Dept. Aware to provide a copy of the vaccination record if obtained from local pharmacy or Health Dept. Verbalized acceptance and understanding.   Covid-19 vaccine status: Declined, Education has been provided regarding the importance of this vaccine but patient still declined. Advised may receive this vaccine at local pharmacy or Health Dept.or vaccine clinic. Aware to provide a copy of the vaccination record if obtained from local pharmacy or Health Dept. Verbalized acceptance and understanding.  Qualifies for Shingles Vaccine? Yes   Zostavax completed No   Shingrix Completed?: No.    Education has been provided regarding the importance of this vaccine. Patient has been advised to call insurance company to determine out of pocket expense if they have not yet received this vaccine. Advised may also receive vaccine at local pharmacy or Health Dept. Verbalized acceptance and understanding.  Screening Tests Health Maintenance  Topic Date Due    Zoster Vaccines- Shingrix (1 of 2) Never done   Pneumonia Vaccine 26+ Years old (1 of 1 - PCV) Never done   DTaP/Tdap/Td (2 - Tdap) 06/14/2016   COVID-19 Vaccine (3 - Pfizer risk series) 12/02/2020   INFLUENZA VACCINE  01/13/2023   MAMMOGRAM  04/30/2023   Fecal DNA (Cologuard)  04/20/2025   DEXA SCAN  Completed   Hepatitis C Screening  Completed   HPV VACCINES  Aged Out    Health Maintenance  Health Maintenance Due  Topic Date Due   Zoster Vaccines- Shingrix (1 of 2) Never done   Pneumonia Vaccine 77+ Years old (1 of 1 - PCV) Never done   DTaP/Tdap/Td (2 - Tdap) 06/14/2016   COVID-19 Vaccine (3 - Pfizer risk series) 12/02/2020   INFLUENZA VACCINE  01/13/2023    Colorectal cancer screening: Type of screening: Cologuard. Completed 11/23. Repeat every 3 years  Mammogram status: Completed 11/23. Repeat every year  Bone Density status: Completed 11/20. Results reflect: Bone density results: OSTEOPOROSIS. Repeat every 2 years. Wants to discuss with PCP at next visit  Lung Cancer Screening: (Low Dose CT Chest recommended if Age 25-80 years,  20 pack-year currently smoking OR have quit w/in 15years.) does not qualify.    Additional Screening:  Hepatitis C Screening: does qualify; Completed 1/17  Vision Screening: Recommended annual ophthalmology exams for early detection of glaucoma and other disorders of the eye. Is the patient up to date with their annual eye exam?  No  Who is the provider or what is the name of the office in which the patient attends annual eye exams? [facility] If pt is not established with a provider, would they like to be referred to a provider to establish care? No .   Dental Screening: Recommended annual dental exams for proper oral hygiene   Community Resource Referral / Chronic Care Management: CRR required this visit?  No   CCM required this visit?  No     Plan:     I have personally reviewed and noted the following in the patient's chart:    Medical and social history Use of alcohol, tobacco or illicit drugs  Current medications and supplements including opioid prescriptions. Patient is not currently taking opioid prescriptions. Functional ability and status Nutritional status Physical activity Advanced directives List of other physicians Hospitalizations, surgeries, and ER visits in previous 12 months Vitals Screenings to include cognitive, depression, and falls Referrals and appointments  In addition, I have reviewed and discussed with patient certain preventive protocols, quality metrics, and best practice recommendations. A written personalized care plan for preventive services as well as general preventive health recommendations were provided to patient.     Sydell Axon, LPN   [id number]   After Visit Summary: (MyChart) Due to this being a telephonic visit, the after visit summary with patients personalized plan was offered to patient via MyChart   Nurse Notes: None

## 2023-02-08 NOTE — Patient Instructions (Signed)
Holly [name] , Thank you for taking time to come for your Medicare Wellness Visit. I appreciate your ongoing commitment to your health goals. Please review the following plan we discussed and let me know if I can assist you in the future.   Referrals/Orders/Follow-Ups/Clinician Recommendations: None  This is a list of the screening recommended for you and due dates:  Health Maintenance  Topic Date Due   Zoster (Shingles) Vaccine (1 of 2) Never done   Pneumonia Vaccine (1 of 1 - PCV) Never done   DTaP/Tdap/Td vaccine (2 - Tdap) 06/14/2016   COVID-19 Vaccine (3 - Pfizer risk series) 12/02/2020   Flu Shot  01/13/2023   Mammogram  04/30/2023   Cologuard (Stool DNA test)  04/20/2025   DEXA scan (bone density measurement)  Completed   Hepatitis C Screening  Completed   HPV Vaccine  Aged Out    Advanced directives: (In Chart) A copy of your advanced directives are scanned into your chart should your provider ever need it.  Next Medicare Annual Wellness Visit scheduled for next year: Yes 02/15/24 @ 9:00

## 2023-02-23 DIAGNOSIS — M1712 Unilateral primary osteoarthritis, left knee: Secondary | ICD-10-CM | POA: Diagnosis not present

## 2023-03-02 ENCOUNTER — Ambulatory Visit: Payer: Medicare PPO | Admitting: Internal Medicine

## 2023-03-02 ENCOUNTER — Encounter: Payer: Self-pay | Admitting: Internal Medicine

## 2023-03-02 VITALS — BP 142/84 | HR 63 | Temp 98.5°F | Ht 66.0 in | Wt 182.0 lb

## 2023-03-02 DIAGNOSIS — R7303 Prediabetes: Secondary | ICD-10-CM | POA: Diagnosis not present

## 2023-03-02 DIAGNOSIS — E559 Vitamin D deficiency, unspecified: Secondary | ICD-10-CM | POA: Diagnosis not present

## 2023-03-02 DIAGNOSIS — E785 Hyperlipidemia, unspecified: Secondary | ICD-10-CM

## 2023-03-02 DIAGNOSIS — Z806 Family history of leukemia: Secondary | ICD-10-CM | POA: Diagnosis not present

## 2023-03-02 DIAGNOSIS — M25562 Pain in left knee: Secondary | ICD-10-CM

## 2023-03-02 DIAGNOSIS — M810 Age-related osteoporosis without current pathological fracture: Secondary | ICD-10-CM

## 2023-03-02 DIAGNOSIS — F331 Major depressive disorder, recurrent, moderate: Secondary | ICD-10-CM | POA: Diagnosis not present

## 2023-03-02 DIAGNOSIS — R430 Anosmia: Secondary | ICD-10-CM | POA: Diagnosis not present

## 2023-03-02 DIAGNOSIS — G609 Hereditary and idiopathic neuropathy, unspecified: Secondary | ICD-10-CM | POA: Diagnosis not present

## 2023-03-02 DIAGNOSIS — R030 Elevated blood-pressure reading, without diagnosis of hypertension: Secondary | ICD-10-CM | POA: Diagnosis not present

## 2023-03-02 LAB — COMPREHENSIVE METABOLIC PANEL WITH GFR
ALT: 11 U/L (ref 0–35)
AST: 18 U/L (ref 0–37)
Albumin: 4.4 g/dL (ref 3.5–5.2)
Alkaline Phosphatase: 50 U/L (ref 39–117)
BUN: 20 mg/dL (ref 6–23)
CO2: 28 meq/L (ref 19–32)
Calcium: 9.4 mg/dL (ref 8.4–10.5)
Chloride: 104 meq/L (ref 96–112)
Creatinine, Ser: 0.92 mg/dL (ref 0.40–1.20)
GFR: 61.09 mL/min (ref >60.00)
Glucose, Bld: 88 mg/dL (ref 70–99)
Potassium: 4.2 meq/L (ref 3.5–5.1)
Sodium: 140 meq/L (ref 135–145)
Total Bilirubin: 0.4 mg/dL (ref 0.2–1.2)
Total Protein: 7 g/dL (ref 6.0–8.3)

## 2023-03-02 LAB — CBC WITH DIFFERENTIAL/PLATELET
Basophils Absolute: 0 10*3/uL (ref 0.0–0.1)
Basophils Relative: 0.7 % (ref 0.0–3.0)
Eosinophils Absolute: 0.2 10*3/uL (ref 0.0–0.7)
Eosinophils Relative: 3.5 % (ref 0.0–5.0)
HCT: 37.2 % (ref 36.0–46.0)
Hemoglobin: 12.1 g/dL (ref 12.0–15.0)
Lymphocytes Relative: 29.3 % (ref 12.0–46.0)
Lymphs Abs: 2 10*3/uL (ref 0.7–4.0)
MCHC: 32.6 g/dL (ref 30.0–36.0)
MCV: 94.6 fl (ref 78.0–100.0)
Monocytes Absolute: 0.5 10*3/uL (ref 0.1–1.0)
Monocytes Relative: 7.9 % (ref 3.0–12.0)
Neutro Abs: 4 10*3/uL (ref 1.4–7.7)
Neutrophils Relative %: 58.6 % (ref 43.0–77.0)
Platelets: 269 10*3/uL (ref 150.0–400.0)
RBC: 3.93 Mil/uL (ref 3.87–5.11)
RDW: 14.1 % (ref 11.5–15.5)
WBC: 6.9 10*3/uL (ref 4.0–10.5)

## 2023-03-02 LAB — LIPID PANEL
Cholesterol: 222 mg/dL — ABNORMAL HIGH (ref 0–200)
HDL: 98.5 mg/dL (ref >39.00)
LDL Cholesterol: 110 mg/dL — ABNORMAL HIGH (ref 0–99)
NonHDL: 123.94
Total CHOL/HDL Ratio: 2
Triglycerides: 68 mg/dL (ref 0.0–149.0)
VLDL: 13.6 mg/dL (ref 0.0–40.0)

## 2023-03-02 LAB — LDL CHOLESTEROL, DIRECT: Direct LDL: 112 mg/dL

## 2023-03-02 LAB — HEMOGLOBIN A1C: Hgb A1c MFr Bld: 5.7 % (ref 4.6–6.5)

## 2023-03-02 LAB — VITAMIN D 25 HYDROXY (VIT D DEFICIENCY, FRACTURES): VITD: 27.45 ng/mL — ABNORMAL LOW (ref 30.00–100.00)

## 2023-03-02 MED ORDER — DENOSUMAB 60 MG/ML ~~LOC~~ SOSY
60.0000 mg | PREFILLED_SYRINGE | Freq: Once | SUBCUTANEOUS | Status: AC
  Administered 2023-03-02: 60 mg via SUBCUTANEOUS
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
## 2023-03-02 NOTE — Patient Instructions (Addendum)
YOUR BLOOD PRESSURE has been high on the last two visits ;  it should be around 130/80 or less.  Please check your blood pressure a few times at home and send me the readings so I can determine if you need to start MEDICATION.  Meloxicam can raise blood pressure so check your BP both on an off NSAIDs  Topical diclofenac (voltaren )  and ICE  will manage the swelling in your knee and will not raise BP    Tylenol is not an NSAID and will not harm kidneys stomach or raise blood pressure   You can add up to 2000 mg of acetominophen (tylenol) every day safely  In divided doses (500 mg every 6 hours  Or 1000 mg every 12 hours.)   Your A1c has been 5.9 for the last 2 years.  We are rechecking it today

## 2023-03-02 NOTE — Assessment & Plan Note (Addendum)
With confirmative T scores and history of  wrist fracture in 2014 secondary to fall. Secondary to surgical menopause at age 77 followed by only 3 years of oral hormone therapy. Patient has deferred use of alendronate which I agree may be problematic given her history of Guillain-Barr affecting the bulbar muscles.   SHE is tolerating Prolia and received her last dose 6 months ago.  It was repeated today

## 2023-03-03 ENCOUNTER — Other Ambulatory Visit: Payer: Self-pay

## 2023-03-03 ENCOUNTER — Ambulatory Visit: Payer: Medicare PPO | Attending: Sports Medicine

## 2023-03-03 DIAGNOSIS — M25562 Pain in left knee: Secondary | ICD-10-CM | POA: Insufficient documentation

## 2023-03-03 DIAGNOSIS — G8929 Other chronic pain: Secondary | ICD-10-CM | POA: Diagnosis not present

## 2023-03-03 DIAGNOSIS — M6281 Muscle weakness (generalized): Secondary | ICD-10-CM | POA: Insufficient documentation

## 2023-03-03 NOTE — Assessment & Plan Note (Signed)
Improving slowly but gradually,  fno kollowing a viral URI.  She has no other symptoms to suggest pituitary tumor

## 2023-03-03 NOTE — Therapy (Addendum)
OUTPATIENT PHYSICAL THERAPY LOWER EXTREMITY EVALUATION   Patient Name: IDELLA BISWELL MRN: [id number] DOB:1946/03/21, 77 y.o., female Today's Date: 03/03/2023  END OF SESSION:  PT End of Session - 03/03/23 0848     Visit Number 1    Number of Visits 17    Date for PT Re-Evaluation 04/28/23    PT Start Time 0900    PT Stop Time 0950    PT Time Calculation (min) 50 min    Activity Tolerance Patient tolerated treatment well    Behavior During Therapy [facility] for tasks assessed/performed             Past Medical History:  Diagnosis Date   Anxiety    Concussion with loss of consciousness 04/10/2017   Depression    Guillain Barr syndrome [facility]) 2005   Osteoporosis    Radial fracture 01/23/2013   Right arm just proximal to wrist. Secondary to fall. Patient has a history of osteoporosis and requires vitamin D and repeat DEXA scan if possible.    Squamous cell skin cancer 2011   Past Surgical History:  Procedure Laterality Date   ABDOMINAL HYSTERECTOMY  1989   APPENDECTOMY  1960   BREAST EXCISIONAL BIOPSY  1980   benign   BREAST SURGERY  1980   biopsy   WRIST FRACTURE SURGERY Right 2015   Patient Active Problem List   Diagnosis Date Noted   Elevated blood pressure reading without diagnosis of hypertension 03/02/2023   Dysgeusia 08/28/2022   Anosmia 08/28/2022   End of life care 08/25/2022   Multiple insect bites 06/30/2022   Pruritus 06/30/2022   Living will in place 04/08/2022   Chronic insomnia 04/14/2021   B12 deficiency 12/09/2020   Peripheral neuropathy 12/06/2020   Bilateral foot pain 12/06/2020   Restless legs 11/27/2019   Autonomous neurogenic bladder 06/24/2019   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   Incomplete bladder emptying 01/11/2019   Eustachian tube disorder, right 11/22/2018   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   Encounter for preventive health examination 07/01/2015   Colon cancer screening 03/16/2013   Right leg weakness 01/23/2013   History of  Guillain-Barre syndrome 01/23/2013   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   UNSPECIFIED VITAMIN D DEFICIENCY 08/26/2008   Recurrent depressive disorder, current episode moderate (HCC) 07/29/2008    PCP: Sherlene Shams, MD  REFERRING PROVIDER: Jerrel Ivory, DO  REFERRING DIAG:  [id number], [phone], [phone], [phone] Unilateral primary OA of left knee    THERAPY DIAG:  Chronic pain of left knee  Muscle weakness (generalized)  Rationale for Evaluation and Treatment: Rehabilitation  ONSET DATE: July, 2023  SUBJECTIVE:   SUBJECTIVE STATEMENT:  Pt is a pleasant 77 y/o F who presents to PT w/ acute onset of chronic L knee pain. Pt reports moving her LLE slightly while sitting resulting in a "popping" noise heard in the LLE w/ immediate swelling. Pt has reduced her activity now only walking "half a block" to decrease L knee pain.   July 2023 woke up couldn't bend knee, OA, corticosteroid shot, recent July, pop hurt to bear weight on it, swole, another shot anti-inflammatory, some swelling careful with it hurts w/ exertion, nervous to walk exercises: online exercises, always watching knee,   PERTINENT HISTORY: Pt reports acute on chronic L knee pain. Pt was dx by her MD with OA in July 2023, and given a L steroid injection that has reduced her L knee pain up until this new onset. Her new onset pain began when pt shifted her LLE  medially while sitting and heard a "pop" and immediate swelling in her L knee two months ago (July, 2024) and her pain has progressed since then. Pt has received another steroid injection since this point however, this did not relieve her pain very much. Pain reported at the posterior lateral> medial aspect of the L knee. Pain described as a dull ache. Pt's pain with no activity is 0/10 NPS and 4/10 NPS with activity. Pt denies radicular symptoms in LLE. Pt has joint crepitus, with some popping and clicking in the knee joint. Pt's aggravating factors include prolonged  walking, and stairs ascending> descending. Denies knee instability. The pain is relieved with her L knee steroid injections and OTC medications such as Tylenol. PAIN:  Are you having pain? Yes: NPRS scale: 0/10 Pain location: Posterior medial and lateral aspect of L knee Pain description: dull ache, same pain throughout Aggravating factors: prolonged walking, stairs (going up more painful)  Relieving factors: Steroid, OTC medications Tylenol 4/10 NPS in L knee w/ activity  PRECAUTIONS: Fall  RED FLAGS: None   WEIGHT BEARING RESTRICTIONS: No  FALLS:  Has patient fallen in last 6 months? No  OCCUPATION: Retired  PLOF: Independent  PATIENT GOALS: Get some exercises that can help with R knee pain.   NEXT MD VISIT: N/A   OBJECTIVE:   DIAGNOSTIC FINDINGS: N/A  PATIENT SURVEYS:  FOTO 63/69  COGNITION: Overall cognitive status: Within functional limits for tasks assessed     SENSATION: WFL  EDEMA:  Circumferential: LLE: tibial tuberosity (38cm), popliteal fossa (42cm), superior pole (45cm). RLE: tibial tuberosity (39cm), popliteal fossa (43cm), superior pole (45cm).   MUSCLE LENGTH: Hamstrings: WFL  PALPATION: Increased swelling noted at lateral aspect of L knee, near distal hamstring attachment.  LOWER EXTREMITY ROM:  Active ROM Right eval Left eval  Knee flexion 130 AROM: 110* PROM: 118*  Knee extension 0 0   (Blank rows = not tested)  LOWER EXTREMITY MMT:  MMT Right eval Left eval  Hip flexion 4- 4-  Hip extension 2+ 2+  Hip abduction 4- 4-  Hip adduction 4- 4-  Hip internal rotation 4 4-  Hip external rotation 4 4-  Knee flexion 4 4  Knee extension 4 4  Ankle dorsiflexion 4 4  Ankle plantarflexion 4 4   (Blank rows = not tested)  LOWER EXTREMITY SPECIAL TESTS: (LLE) Knee special tests: Anterior drawer test: negative, Posterior drawer test: negative, and McMurray's test: negative Valgus stress test: negative, Varus stress test:  negative  FUNCTIONAL TESTS:  5 times sit to stand: 11.99 sec SLS attempted: unable to complete bilaterally 2/2 weakness in bilat LE's, defer to next session   JOINT MOBILITY: LLE hypomobility noted in all planes. Typical patellar mobility noted in LLE.    TODAY'S TREATMENT:  DATE: 03/03/23  Reviewed pt's HEP (reps/ sets/ freq)   POC, Prognosis  PATIENT EDUCATION:  Education details: HEP given to pt Person educated: Patient Education method: Explanation Education comprehension: verbalized understanding and needs further education  HOME EXERCISE PROGRAM: Access Code: [email or web address] URL: [email or web address] Date: 03/03/2023 Prepared by: Ronnie Derby  Exercises - Supine Heel Slide  - 1 x daily - 7 x weekly - 2 sets - 12 reps - Clamshell  - 1 x daily - 3-4 x weekly - 3 sets - 8 reps - Prone Hip Extension  - 1 x daily - 3-4 x weekly - 3 sets - 8 reps - Mini Squat with Counter Support  - 1 x daily - 3-4 x weekly - 3 sets - 8 reps  ASSESSMENT:  CLINICAL IMPRESSION: Patient is a 77 y.o. F who was seen today for physical therapy evaluation and treatment for acute on chronic L knee pain. Pt demonstrates mild joint hypomobility in all planes w/ typical patellar movement and decreased L knee flexion AROM (110 deg) w/ concordant pain. Pt presents with deficits including postero- lateral L knee edema, proximal hip weakness, and painful WB with prolonged walking and stair negotiation (ascending> descending). No TTP noted w/ palpation, however pt identifies pain at the medial and lateral aspect of the posterior L knee. MCL, ACL, PCL, and LCL testing of the L knee resulted in negative w/ no concordant pain along w/ meniscal testing also being negative. Pt unable to complete SLS assessment 2/2 general unsteadiness and mild dizziness. These deficits are limiting  participation in prolonged walking ADL's and stair negotiation due to moderate pain levels. Pt will benefit from skilled PT services to address these deficits to reduce pain and maximize functional capacity with walking, steps, and household ADL's.   OBJECTIVE IMPAIRMENTS: decreased activity tolerance, decreased balance, decreased mobility, difficulty walking, decreased ROM, decreased strength, hypomobility, and pain.   ACTIVITY LIMITATIONS: carrying, lifting, and squatting  PARTICIPATION LIMITATIONS: cleaning and community activity  PERSONAL FACTORS: Age, Time since onset of injury/illness/exacerbation, and 1 comorbidity: L knee OA  are also affecting patient's functional outcome.   REHAB POTENTIAL: Good  CLINICAL DECISION MAKING: Stable/uncomplicated  EVALUATION COMPLEXITY: Low   GOALS: Goals reviewed with patient? Yes  SHORT TERM GOALS: Target date: 03/31/2023 Pt will be independent with HEP to improve L knee strength and decrease pain with functional activities  Baseline: 03/03/23: HEP given to pt  Goal status: INITIAL   LONG TERM GOALS: Target date: 04/28/2023  Pt will improve FOTO to target score to demonstrate clinically significant improvement in functional mobility. Baseline: 03/03/23: 63/69 Goal status: INITIAL  2.  Pt will decrease L knee pain to </= 2/10 NPS to demonstrate clinically significant decrease in pain w/ activity. Baseline: 03/03/23: 4/10 NPS Goal status: INITIAL  3.  Pt will increase her SLS time on RLE/LLE to >/= 17 seconds to equal age matched norms and display improvement in pt's LE strength and balance.  Baseline: 03/03/23: Deferred to next session  Goal status: INITIAL  4.  Pt will improve 6 MWT by at least 165' to demonstrate clinically significant improvement in tolerance for community walking distances.  Baseline: 03/03/23: Deferred to next session Goal status: INITIAL    PLAN:  PT FREQUENCY: 2x/week  PT DURATION: 8 weeks  PLANNED  INTERVENTIONS: Therapeutic exercises, Therapeutic activity, Neuromuscular re-education, Balance training, Gait training, Patient/Family education, Self Care, Joint mobilization, Joint manipulation, Stair training, Electrical stimulation, Spinal manipulation, Spinal mobilization, Cryotherapy, Moist heat, Manual therapy, and Re-evaluation  PLAN FOR NEXT SESSION: , Review HEP, progress LLE strengthening exercises.  Lovie Macadamia, SPT  Delphia Grates. Fairly IV, PT, DPT Physical Therapist- [facility] [place]  [facility]  03/03/2023, 3:14 PM
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
## 2023-03-03 NOTE — Assessment & Plan Note (Signed)
Managed by [facility],   she is taking meloxica for OA//DJD. She has deferred surgery until her pain becomes activity limiting.  Exercises for strengthen quads and biceps  demonstrated,  advised to use tyelnol daily and NSAIDS prn

## 2023-03-03 NOTE — Assessment & Plan Note (Signed)
Etiology unclear.  Metabolic screen WAS NORMAL.   Symptoms have not progressed i

## 2023-03-03 NOTE — Assessment & Plan Note (Signed)
She has no history of hypertension but has had several elevated readings.  She has been asked to check her pressures at home after suspending melosican and submit readings for evaluation. Renal function is normal    Lab Results  Component Value Date   CREATININE 0.92 03/02/2023   Lab Results  Component Value Date   NA 140 03/02/2023   K 4.2 03/02/2023   CL 104 03/02/2023   CO2 28 03/02/2023

## 2023-03-07 ENCOUNTER — Other Ambulatory Visit: Payer: Self-pay | Admitting: Internal Medicine

## 2023-03-07 ENCOUNTER — Ambulatory Visit: Payer: Medicare PPO

## 2023-03-07 DIAGNOSIS — G8929 Other chronic pain: Secondary | ICD-10-CM | POA: Diagnosis not present

## 2023-03-07 DIAGNOSIS — M7122 Synovial cyst of popliteal space [Baker], left knee: Secondary | ICD-10-CM

## 2023-03-07 DIAGNOSIS — M6281 Muscle weakness (generalized): Secondary | ICD-10-CM

## 2023-03-07 DIAGNOSIS — M25562 Pain in left knee: Secondary | ICD-10-CM | POA: Diagnosis not present

## 2023-03-07 NOTE — Therapy (Addendum)
OUTPATIENT PHYSICAL THERAPY LOWER EXTREMITY TREATMENT    Patient Name: Holly [name] MRN: [id number] DOB:Nov 07, 1945, 77 y.o., female Today's Date: 03/07/2023  END OF SESSION:  PT End of Session - 03/07/23 1344     Visit Number 2    Number of Visits 17    Date for PT Re-Evaluation 04/28/23    PT Start Time 1345    PT Stop Time 1426    PT Time Calculation (min) 41 min    Activity Tolerance Patient tolerated treatment well    Behavior During Therapy [facility] for tasks assessed/performed             Past Medical History:  Diagnosis Date   Anxiety    Concussion with loss of consciousness 04/10/2017   Depression    Guillain Barr syndrome (HCC) 2005   Osteoporosis    Radial fracture 01/23/2013   Right arm just proximal to wrist. Secondary to fall. Patient has a history of osteoporosis and requires vitamin D and repeat DEXA scan if possible.    Squamous cell skin cancer 2011   Past Surgical History:  Procedure Laterality Date   ABDOMINAL HYSTERECTOMY  1989   APPENDECTOMY  1960   BREAST EXCISIONAL BIOPSY  1980   benign   BREAST SURGERY  1980   biopsy   WRIST FRACTURE SURGERY Right 2015   Patient Active Problem List   Diagnosis Date Noted   Left medial knee pain 03/03/2023   Elevated blood pressure reading without diagnosis of hypertension 03/02/2023   Dysgeusia 08/28/2022   Anosmia 08/28/2022   End of life care 08/25/2022   Living will in place 04/08/2022   Chronic insomnia 04/14/2021   B12 deficiency 12/09/2020   Peripheral neuropathy 12/06/2020   Bilateral foot pain 12/06/2020   Restless legs 11/27/2019   Autonomous neurogenic bladder 06/24/2019   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   Incomplete bladder emptying 01/11/2019   Eustachian tube disorder, right 11/22/2018   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   Encounter for preventive health examination 07/01/2015   Colon cancer screening 03/16/2013   Right leg weakness 01/23/2013   History of Guillain-Barre syndrome  01/23/2013   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   UNSPECIFIED VITAMIN D DEFICIENCY 08/26/2008   Recurrent depressive disorder, current episode moderate (HCC) 07/29/2008    PCP: Sherlene Shams, MD  REFERRING PROVIDER: Juanell Fairly, MD  REFERRING DIAG:  [phone], [phone], [phone], [phone] Unilateral primary OA of left knee    THERAPY DIAG:  Chronic pain of left knee - Plan: PT plan of care cert/re-cert  Muscle weakness (generalized) - Plan: PT plan of care cert/re-cert  Rationale for Evaluation and Treatment: Rehabilitation  ONSET DATE: July, 2023  SUBJECTIVE:   SUBJECTIVE STATEMENT:  Pt reports 4-5/10 NPS in the L knee with activity at today's session. She states that she has been HEP compliant however difficulty w/ all L knee flexion exercises feeling as though she is "stuck" at greater than 45 deg of L knee flexion.   PERTINENT HISTORY: Pt reports acute on chronic L knee pain. Pt was dx by her MD with OA in July 2023, and given a L steroid injection that has reduced her L knee pain up until this new onset. Her new onset pain began when pt shifted her LLE medially while sitting and heard a "pop" and immediate swelling in her L knee two months ago (July, 2024) and her pain has progressed since then. Pt has received another steroid injection since this point however, this did not relieve her pain  very much. Pain reported at the posterior lateral> medial aspect of the L knee. Pain described as a dull ache. Pt's pain with no activity is 0/10 NPS and 4/10 NPS with activity. Pt denies radicular symptoms in LLE. Pt has joint crepitus, with some popping and clicking in the knee joint. Pt's aggravating factors include prolonged walking, and stairs ascending> descending. Denies knee instability. The pain is relieved with her L knee steroid injections and OTC medications such as Tylenol. PAIN:  Are you having pain? Yes: NPRS scale: 0/10 Pain location: Posterior medial and lateral aspect of L  knee Pain description: dull ache, same pain throughout Aggravating factors: prolonged walking, stairs (going up more painful)  Relieving factors: Steroid, OTC medications Tylenol 4/10 NPS in L knee w/ activity  PRECAUTIONS: Fall  RED FLAGS: None   WEIGHT BEARING RESTRICTIONS: No  FALLS:  Has patient fallen in last 6 months? No  OCCUPATION: Retired  PLOF: Independent  PATIENT GOALS: Get some exercises that can help with R knee pain.   NEXT MD VISIT: N/A   OBJECTIVE:   DIAGNOSTIC FINDINGS: N/A  PATIENT SURVEYS:  FOTO 63/69  COGNITION: Overall cognitive status: Within functional limits for tasks assessed     SENSATION: WFL  EDEMA:  Circumferential: LLE: tibial tuberosity (38cm), popliteal fossa (42cm), superior pole (45cm). RLE: tibial tuberosity (39cm), popliteal fossa (43cm), superior pole (45cm).   MUSCLE LENGTH: Hamstrings: WFL  PALPATION: Increased swelling noted at lateral aspect of L knee, near distal hamstring attachment.  LOWER EXTREMITY ROM:  Active ROM Right eval Left eval  Knee flexion 130 AROM: 110* PROM: 118*  Knee extension 0 0   (Blank rows = not tested)  LOWER EXTREMITY MMT:  MMT Right eval Left eval  Hip flexion 4- 4-  Hip extension 2+ 2+  Hip abduction 4- 4-  Hip adduction 4- 4-  Hip internal rotation 4 4-  Hip external rotation 4 4-  Knee flexion 4 4  Knee extension 4 4  Ankle dorsiflexion 4 4  Ankle plantarflexion 4 4   (Blank rows = not tested)  LOWER EXTREMITY SPECIAL TESTS: (LLE) Knee special tests: Anterior drawer test: negative, Posterior drawer test: negative, and McMurray's test: negative Valgus stress test: negative, Varus stress test: negative  FUNCTIONAL TESTS:  5 times sit to stand: 11.99 sec SLS attempted: unable to complete bilaterally 2/2 weakness in bilat LE's, defer to next session   JOINT MOBILITY: LLE hypomobility noted in all planes. Typical patellar mobility noted in LLE.    TODAY'S TREATMENT:                                                                                                                               DATE: 03/07/23  Recumbent bike for increasing LLE knee flexion x5 minutes lvl 1.0  Prone Palpation: Notable lump on popliteal fossa more so on lateral aspect. Spongy in nature.   Prone hamstring MMT: 4/5 bilat for lateral and medial  bias on LLE with no pain.   Reviewed pt's HEP (reps/ sets/ freq)   Sidelying clamshells RLE/LLE 2 x10 w/ GTB Reverse sidelying clamshell RLE/LLE 2 x10 w/ GTB Standing hip extension RLE/LLE 2 x10  Standing mini- squats RLE/ LLE 2 x10 (v/c w/in pain-free ROM)   PATIENT EDUCATION:  Education details: HEP given to pt Person educated: Patient Education method: Explanation Education comprehension: verbalized understanding and needs further education  HOME EXERCISE PROGRAM: Access Code: [email or web address] URL: [email or web address] Date: 03/03/2023 Prepared by: Ronnie Derby  Exercises - Supine Heel Slide  - 1 x daily - 7 x weekly - 2 sets - 12 reps - Clamshell  - 1 x daily - 3-4 x weekly - 3 sets - 8 reps - Prone Hip Extension  - 1 x daily - 3-4 x weekly - 3 sets - 8 reps - Mini Squat with Counter Support  - 1 x daily - 3-4 x weekly - 3 sets - 8 reps  ASSESSMENT:  CLINICAL IMPRESSION: Session focused on reviewing pt's HEP and further assessment of pt's postero- lateral L knee pain. Pt continues to note pain w/ L knee flexion beyond 45 degrees noting difficulty completing knee flexion exercises listed in HEP. Upon further examination, pt's sx including location of pain, swelling with no warmth to touch or redness,along w/ "nodule" felt with palpation, align w/ sx of a Baker's cyst. Pt educated on a description of what a Baker's cyst is, and informed that her MD would be able to input a referral for formal diagnostic testing to rule in/out a Baker's cyst. Pt educated on physical therapy will continue to focus on proximal hip  strengthening and L knee flexion in pain-free ROM. Pt verbalized understanding. PT reached out to MD to update on findings. Pt's HEP reviewed/ updated to pt's tolerance. Pt would continue benefit from skilled PT services to address these deficits to reduce pain and maximize functional capacity with walking, steps, and household ADL's.    OBJECTIVE IMPAIRMENTS: decreased activity tolerance, decreased balance, decreased mobility, difficulty walking, decreased ROM, decreased strength, hypomobility, and pain.   ACTIVITY LIMITATIONS: carrying, lifting, and squatting  PARTICIPATION LIMITATIONS: cleaning and community activity  PERSONAL FACTORS: Age, Time since onset of injury/illness/exacerbation, and 1 comorbidity: L knee OA  are also affecting patient's functional outcome.   REHAB POTENTIAL: Good  CLINICAL DECISION MAKING: Stable/uncomplicated  EVALUATION COMPLEXITY: Low   GOALS: Goals reviewed with patient? Yes  SHORT TERM GOALS: Target date: 03/31/2023 Pt will be independent with HEP to improve L knee strength and decrease pain with functional activities  Baseline: 03/03/23: HEP given to pt  Goal status: INITIAL   LONG TERM GOALS: Target date: 04/28/2023  Pt will improve FOTO to target score to demonstrate clinically significant improvement in functional mobility. Baseline: 03/03/23: 63/69 Goal status: INITIAL  2.  Pt will decrease L knee pain to </= 2/10 NPS to demonstrate clinically significant decrease in pain w/ activity. Baseline: 03/03/23: 4/10 NPS Goal status: INITIAL  3.  Pt will increase her SLS time on RLE/LLE to >/= 17 seconds to equal age matched norms and display improvement in pt's LE strength and balance.  Baseline: 03/03/23: Deferred to next session  Goal status: INITIAL  4.  Pt will improve 6 MWT by at least 165' to demonstrate clinically significant improvement in tolerance for community walking distances.  Baseline: 03/03/23: Deferred to next session Goal  status: INITIAL    PLAN:  PT FREQUENCY: 2x/week  PT DURATION: 8 weeks  PLANNED INTERVENTIONS:  Therapeutic exercises, Therapeutic activity, Neuromuscular re-education, Balance training, Gait training, Patient/Family education, Self Care, Joint mobilization, Joint manipulation, Stair training, Electrical stimulation, Spinal manipulation, Spinal mobilization, Cryotherapy, Moist heat, Manual therapy, and Re-evaluation  PLAN FOR NEXT SESSION: progress LLE strengthening exercises.  Lovie Macadamia, SPT  Delphia Grates. Fairly IV, PT, DPT Physical Therapist- [place]  [facility]  03/07/2023, 3:16 PM

## 2023-03-09 ENCOUNTER — Ambulatory Visit: Payer: Medicare PPO

## 2023-03-09 DIAGNOSIS — G8929 Other chronic pain: Secondary | ICD-10-CM | POA: Diagnosis not present

## 2023-03-09 DIAGNOSIS — M6281 Muscle weakness (generalized): Secondary | ICD-10-CM | POA: Diagnosis not present

## 2023-03-09 DIAGNOSIS — M25562 Pain in left knee: Secondary | ICD-10-CM | POA: Diagnosis not present

## 2023-03-09 NOTE — Therapy (Addendum)
OUTPATIENT PHYSICAL THERAPY LOWER EXTREMITY TREATMENT    Patient Name: CARLEE KOWALESKI MRN: [id number] DOB:1945-12-17, 77 y.o., female Today's Date: 03/09/2023  END OF SESSION:  PT End of Session - 03/09/23 1348     Visit Number 3    Number of Visits 17    Date for PT Re-Evaluation 04/28/23    PT Start Time 1346    PT Stop Time 1428    PT Time Calculation (min) 42 min    Activity Tolerance Patient tolerated treatment well    Behavior During Therapy [facility] for tasks assessed/performed             Past Medical History:  Diagnosis Date   Anxiety    Concussion with loss of consciousness 04/10/2017   Depression    Guillain Barr syndrome (HCC) 2005   Osteoporosis    Radial fracture 01/23/2013   Right arm just proximal to wrist. Secondary to fall. Patient has a history of osteoporosis and requires vitamin D and repeat DEXA scan if possible.    Squamous cell skin cancer 2011   Past Surgical History:  Procedure Laterality Date   ABDOMINAL HYSTERECTOMY  1989   APPENDECTOMY  1960   BREAST EXCISIONAL BIOPSY  1980   benign   BREAST SURGERY  1980   biopsy   WRIST FRACTURE SURGERY Right 2015   Patient Active Problem List   Diagnosis Date Noted   Left medial knee pain 03/03/2023   Elevated blood pressure reading without diagnosis of hypertension 03/02/2023   Dysgeusia 08/28/2022   Anosmia 08/28/2022   End of life care 08/25/2022   Living will in place 04/08/2022   Chronic insomnia 04/14/2021   B12 deficiency 12/09/2020   Peripheral neuropathy 12/06/2020   Bilateral foot pain 12/06/2020   Restless legs 11/27/2019   Autonomous neurogenic bladder 06/24/2019   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   Incomplete bladder emptying 01/11/2019   Eustachian tube disorder, right 11/22/2018   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   Encounter for preventive health examination 07/01/2015   Colon cancer screening 03/16/2013   Right leg weakness 01/23/2013   History of Guillain-Barre syndrome  01/23/2013   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 She reports increased pain in the lateral aspect of the L knee which is a recent change since completing her HEP. Pt reports her MD has ordered an ultrasound for her L posterior knee, however she is waiting to get the appt scheduled.   PERTINENT HISTORY: Pt reports acute on chronic L knee pain. Pt was dx by her MD with OA in July 2023, and given a L steroid injection that has reduced her L knee pain up until this new onset. Her new onset pain began when pt shifted her LLE medially while sitting and heard a "pop" and immediate swelling in her L knee two months ago (July, 2024) and her pain has progressed since then. Pt has received another steroid injection since this point however, this did not relieve her pain very much. Pain reported at the posterior  lateral> medial aspect of the L knee. Pain described as a dull ache. Pt's pain with no activity is 0/10 NPS and 4/10 NPS with activity. Pt denies radicular symptoms in LLE. Pt has joint crepitus, with some popping and clicking in the knee joint. Pt's aggravating factors include prolonged walking, and stairs ascending> descending. Denies knee instability. The pain is relieved with her L knee steroid injections and OTC medications such as Tylenol. PAIN:  Are you having pain? Yes: NPRS scale: 0/10 Pain location: Posterior medial and lateral aspect of L knee Pain description: dull ache, same pain  throughout Aggravating factors: prolonged walking, stairs (going up more painful)  Relieving factors: Steroid, OTC medications Tylenol 4/10 NPS in L knee w/ activity  PRECAUTIONS: Fall  RED FLAGS: None   WEIGHT BEARING RESTRICTIONS: No  FALLS:  Has patient fallen in last 6 months? No  OCCUPATION: Retired  PLOF: Independent  PATIENT GOALS: Get some exercises that can help with R knee pain.   NEXT MD VISIT: N/A   OBJECTIVE:   DIAGNOSTIC FINDINGS: N/A  PATIENT SURVEYS:  FOTO 63/69  COGNITION: Overall cognitive status: Within functional limits for tasks assessed     SENSATION: WFL  EDEMA:  Circumferential: LLE: tibial tuberosity (38cm), popliteal fossa (42cm), superior pole (45cm). RLE: tibial tuberosity (39cm), popliteal fossa (43cm), superior pole (45cm).   MUSCLE LENGTH: Hamstrings: WFL  PALPATION: Increased swelling noted at lateral aspect of L knee, near distal hamstring attachment.  LOWER EXTREMITY ROM:  Active ROM Right eval Left eval  Knee flexion 130 AROM: 110* PROM: 118*  Knee extension 0 0   (Blank rows = not tested)  LOWER EXTREMITY MMT:  MMT Right eval Left eval  Hip flexion 4- 4-  Hip extension 2+ 2+  Hip abduction 4- 4-  Hip adduction 4- 4-  Hip internal rotation 4 4-  Hip external rotation 4 4-  Knee flexion 4 4  Knee extension 4 4  Ankle dorsiflexion 4 4  Ankle plantarflexion 4 4   (Blank rows = not tested)  LOWER EXTREMITY SPECIAL TESTS: (LLE) Knee special tests: Anterior drawer test: negative, Posterior drawer test: negative, and McMurray's test: negative Valgus stress test: negative, Varus stress test: negative  FUNCTIONAL TESTS:  5 times sit to stand: 11.99 sec SLS attempted: unable to complete bilaterally 2/2 weakness in bilat LE's, defer to next session   JOINT MOBILITY: LLE hypomobility noted in all planes. Typical patellar mobility noted in LLE.    TODAY'S TREATMENT:                                                                                                                               DATE: 03/09/23  Recumbent bike for increasing LLE knee flexion x5 minutes lvl 1.0 Standing hip extension RLE/LLE 2 x10 each side with bilat UE support  Standing hip abduction RLE/ LLE 2 x10 each side w/ bilat UE support Knee extension at [facility] machine 3 x8 w/  Bilat LE's 15# Standing alternating lunges at second step RLE/LLE w/ 4# DB's in bilat Ue's. X10/ each side Sidelying clamshells w/ GTB RLE/LLE 2 x10/ each side Sidelying reverse clamshells w/ GTB RLE/LLE 3  x10/ each side Seated hamstring curls w/ GTB on LLE 2 x10    PATIENT EDUCATION:  Education details: HEP given to pt Person educated: Patient Education method: Explanation Education comprehension: verbalized understanding and needs further education  HOME EXERCISE PROGRAM: Access Code: [email or web address] URL: [email or web address] Date: 03/03/2023 Prepared by: Ronnie Derby  Exercises - Supine Heel Slide  - 1 x daily - 7 x weekly - 2 sets - 12 reps - Clamshell  - 1 x daily - 3-4 x weekly - 3 sets - 8 reps - Prone Hip Extension  - 1 x daily - 3-4 x weekly - 3 sets - 8 reps - Mini Squat with Counter Support  - 1 x daily - 3-4 x weekly - 3 sets - 8 reps  ASSESSMENT:  CLINICAL IMPRESSION:  Session focused on continuing to progress bilat LE strengthening and L knee ROM exercises. Pt continues to note progression in L knee strength w/ improved activity tolerance at today's session. Pt continues to note L postero-lateral knee pain with deep knee flexion exercises, 2/2 to "cyst-like" nodule behind the knee. Educated pt on remaining in pain-free ROM during HEP exercises to maximize movement while limiting L knee pain. Pt verbalized understanding. Pt would continue benefit from skilled PT services to address these deficits to reduce pain and maximize functional capacity with walking, steps, and household ADL's.   OBJECTIVE IMPAIRMENTS: decreased activity  tolerance, decreased balance, decreased mobility, difficulty walking, decreased ROM, decreased strength, hypomobility, and pain.   ACTIVITY LIMITATIONS: carrying, lifting, and squatting  PARTICIPATION LIMITATIONS: cleaning and community activity  PERSONAL FACTORS: Age, Time since onset of injury/illness/exacerbation, and 1 comorbidity: L knee OA  are also affecting patient's functional outcome.   REHAB POTENTIAL: Good  CLINICAL DECISION MAKING: Stable/uncomplicated  EVALUATION COMPLEXITY: Low   GOALS: Goals reviewed with patient? Yes  SHORT TERM GOALS: Target date: 03/31/2023 Pt will be independent with HEP to improve L knee strength and decrease pain with functional activities  Baseline: 03/03/23: HEP given to pt  Goal status: INITIAL   LONG TERM GOALS: Target date: 04/28/2023  Pt will improve FOTO to target score to demonstrate clinically significant improvement in functional mobility. Baseline: 03/03/23: 63/69 Goal status: INITIAL  2.  Pt will decrease L knee pain to </= 2/10 NPS to demonstrate clinically significant decrease in pain w/ activity. Baseline: 03/03/23: 4/10 NPS Goal status: INITIAL  3.  Pt will increase her SLS time on RLE/LLE to >/= 17 seconds to equal age matched norms and display improvement in pt's LE strength and balance.  Baseline: 03/03/23: Deferred to next session  Goal status: INITIAL  4.  Pt will improve 6 MWT by at least 165' to demonstrate clinically significant improvement in tolerance for community walking distances.  Baseline: 03/03/23: Deferred to next session Goal status: INITIAL    PLAN:  PT FREQUENCY: 2x/week  PT DURATION: 8 weeks  PLANNED INTERVENTIONS: Therapeutic exercises, Therapeutic activity, Neuromuscular re-education, Balance training, Gait training, Patient/Family education, Self Care, Joint mobilization, Joint manipulation, Stair training, Electrical stimulation, Spinal manipulation, Spinal mobilization, Cryotherapy, Moist  heat, Manual therapy, and Re-evaluation  PLAN FOR NEXT SESSION: , progress LLE strengthening exercises.  Lovie Macadamia, SPT  Delphia Grates. Fairly IV, PT, DPT Physical Therapist- [place]  [facility]  03/09/2023, 3:47  PM

## 2023-03-15 ENCOUNTER — Telehealth: Payer: Self-pay | Admitting: Internal Medicine

## 2023-03-15 ENCOUNTER — Ambulatory Visit: Payer: Medicare PPO | Attending: Orthopedic Surgery

## 2023-03-15 ENCOUNTER — Other Ambulatory Visit: Payer: Self-pay | Admitting: Internal Medicine

## 2023-03-15 DIAGNOSIS — G8929 Other chronic pain: Secondary | ICD-10-CM | POA: Insufficient documentation

## 2023-03-15 DIAGNOSIS — M6281 Muscle weakness (generalized): Secondary | ICD-10-CM | POA: Diagnosis not present

## 2023-03-15 DIAGNOSIS — M25562 Pain in left knee: Secondary | ICD-10-CM | POA: Diagnosis not present

## 2023-03-15 DIAGNOSIS — M25461 Effusion, right knee: Secondary | ICD-10-CM

## 2023-03-15 NOTE — Therapy (Addendum)
OUTPATIENT PHYSICAL THERAPY LOWER EXTREMITY TREATMENT    Patient Name: Holly [name] MRN: [id number] DOB:[birth year], 77 y.o., female Today's Date: 03/15/2023  END OF SESSION:  PT End of Session - 03/15/23 1343     Visit Number 4    Number of Visits 17    Date for PT Re-Evaluation 04/28/23    PT Start Time 1345    PT Stop Time 1427    PT Time Calculation (min) 42 min    Activity Tolerance Patient tolerated treatment well    Behavior During Therapy [facility] for tasks assessed/performed             Past Medical History:  Diagnosis Date   Anxiety    Concussion with loss of consciousness 04/10/2017   Depression    Guillain Barr syndrome (HCC) 2005   Osteoporosis    Radial fracture 01/23/2013   Right arm just proximal to wrist. Secondary to fall. Patient has a history of osteoporosis and requires vitamin D and repeat DEXA scan if possible.    Squamous cell skin cancer 2011   Past Surgical History:  Procedure Laterality Date   ABDOMINAL HYSTERECTOMY  1989   APPENDECTOMY  1960   BREAST EXCISIONAL BIOPSY  1980   benign   BREAST SURGERY  1980   biopsy   WRIST FRACTURE SURGERY Right 2015   Patient Active Problem List   Diagnosis Date Noted   Left medial knee pain 03/03/2023   Elevated blood pressure reading without diagnosis of hypertension 03/02/2023   Dysgeusia 08/28/2022   Anosmia 08/28/2022   End of life care 08/25/2022   Living will in place 04/08/2022   Chronic insomnia 04/14/2021   B12 deficiency 12/09/2020   Peripheral neuropathy 12/06/2020   Bilateral foot pain 12/06/2020   Restless legs 11/27/2019   Autonomous neurogenic bladder 06/24/2019   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   Incomplete bladder emptying 01/11/2019   Eustachian tube disorder, right 11/22/2018   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   Encounter for preventive health examination 07/01/2015   Colon cancer screening 03/16/2013   Right leg weakness 01/23/2013   History of Guillain-Barre syndrome  01/23/2013   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 Pt reports she has not had a good week wit increased pain in the left knee. Ultrasound scheduled for Monday.    PERTINENT HISTORY: Pt reports acute on chronic L knee pain. Pt was dx by her MD with OA in July 2023, and given a L steroid injection that has reduced her L knee pain up until this new onset. Her new onset pain began when pt shifted her LLE medially while sitting and heard a "pop" and immediate swelling in her L knee two months ago (July, 2024) and her pain has progressed since then. Pt has received another steroid injection since this point however, this did not relieve her pain very much. Pain reported at the posterior lateral> medial aspect of the L knee. Pain described as a dull ache. Pt's pain with no activity is 0/10 NPS  and 4/10 NPS with activity. Pt denies radicular symptoms in LLE. Pt has joint crepitus, with some popping and clicking in the knee joint. Pt's aggravating factors include prolonged walking, and stairs ascending> descending. Denies knee instability. The pain is relieved with her L knee steroid injections and OTC medications such as Tylenol.  PAIN:  Are you having pain? Yes: NPRS scale: 0/10 Pain location: Posterior medial and lateral aspect of L knee Pain description: dull ache, same pain throughout Aggravating factors: prolonged walking, stairs (going up more painful)  Relieving factors: Steroid, OTC  medications Tylenol 4/10 NPS in L knee w/ activity  PRECAUTIONS: Fall  RED FLAGS: None   WEIGHT BEARING RESTRICTIONS: No  FALLS:  Has patient fallen in last 6 months? No  OCCUPATION: Retired  PLOF: Independent  PATIENT GOALS: Get some exercises that can help with R knee pain.   NEXT MD VISIT: N/A   OBJECTIVE:   DIAGNOSTIC FINDINGS: N/A  PATIENT SURVEYS:  FOTO 63/69  COGNITION: Overall cognitive status: Within functional limits for tasks assessed     SENSATION: WFL  EDEMA:  Circumferential: LLE: tibial tuberosity (38cm), popliteal fossa (42cm), superior pole (45cm). RLE: tibial tuberosity (39cm), popliteal fossa (43cm), superior pole (45cm).   MUSCLE LENGTH: Hamstrings: WFL  PALPATION: Increased swelling noted at lateral aspect of L knee, near distal hamstring attachment.  LOWER EXTREMITY ROM:  Active ROM Right eval Left eval  Knee flexion 130 AROM: 110* PROM: 118*  Knee extension 0 0   (Blank rows = not tested)  LOWER EXTREMITY MMT:  MMT Right eval Left eval  Hip flexion 4- 4-  Hip extension 2+ 2+  Hip abduction 4- 4-  Hip adduction 4- 4-  Hip internal rotation 4 4-  Hip external rotation 4 4-  Knee flexion 4 4  Knee extension 4 4  Ankle dorsiflexion 4 4  Ankle plantarflexion 4 4   (Blank rows = not tested)  LOWER EXTREMITY SPECIAL TESTS: (LLE) Knee special tests: Anterior drawer test: negative, Posterior drawer test: negative, and McMurray's test: negative Valgus stress test: negative, Varus stress test: negative  FUNCTIONAL TESTS:  5 times sit to stand: 11.99 sec SLS attempted: unable to complete bilaterally 2/2 weakness in bilat LE's, defer to next session   JOINT MOBILITY: LLE hypomobility noted in all planes. Typical patellar mobility noted in LLE.    TODAY'S TREATMENT: DATE: 03/15/23  administered at beginning of today's session. 1,371ft. (Pain in L knee increased to 5/10 NPS following assessment)   Seated knee extension  w/ blue theraband 3 x10 Standing hamstring curls w/ 2# RLE 3# LLE 3 x10 / each side     PATIENT EDUCATION:  Education details: HEP given to pt Person educated: Patient Education method: Explanation Education comprehension: verbalized understanding and needs further education  HOME EXERCISE PROGRAM: Access Code: [email or web address] URL: [email or web address] Date: 03/03/2023 Prepared by: Ronnie Derby  Exercises - Supine Heel Slide  - 1 x daily - 7 x weekly - 2 sets - 12 reps - Clamshell  - 1 x daily - 3-4 x weekly - 3 sets - 8 reps - Prone Hip Extension  - 1 x daily - 3-4 x weekly - 3 sets - 8 reps - Mini Squat with Counter Support  - 1 x daily - 3-4 x weekly - 3 sets - 8 reps  ASSESSMENT:  CLINICAL IMPRESSION:  Session focused on continuing to progress bilat LE strengthening and L knee ROM exercises along w/ administering the . Pt able to ambulate  1346ft during the however, pt's L knee pain increased from 3/10 to 5/10 following completion of this assessment. Pt's L knee pain 2/2 to "cyst-like" nodule behind the knee, continues to be a limiting factor for completing her HEP and other ADL's. Pt updated on modifications that can be made of her home exercises.  Pt would continue benefit from skilled PT services to address these deficits to reduce pain and maximize functional capacity with walking, steps, and household ADL's.   OBJECTIVE IMPAIRMENTS: decreased activity tolerance, decreased balance, decreased mobility, difficulty walking, decreased ROM, decreased strength, hypomobility, and pain.   ACTIVITY LIMITATIONS: carrying, lifting, and squatting  PARTICIPATION LIMITATIONS: cleaning and community activity  PERSONAL FACTORS: Age, Time since onset of injury/illness/exacerbation, and 1 comorbidity: L knee OA  are also affecting patient's functional outcome.   REHAB POTENTIAL: Good  CLINICAL DECISION MAKING: Stable/uncomplicated  EVALUATION COMPLEXITY:  Low   GOALS: Goals reviewed with patient? Yes  SHORT TERM GOALS: Target date: 03/31/2023 Pt will be independent with HEP to improve L knee strength and decrease pain with functional activities  Baseline: 03/03/23: HEP given to pt  Goal status: INITIAL   LONG TERM GOALS: Target date: 04/28/2023  Pt will improve FOTO to target score to demonstrate clinically significant improvement in functional mobility. Baseline: 03/03/23: 63/69 Goal status: INITIAL  2.  Pt will decrease L knee pain to </= 2/10 NPS to demonstrate clinically significant decrease in pain w/ activity. Baseline: 03/03/23: 4/10 NPS Goal status: INITIAL  3.  Pt will increase her SLS time on RLE/LLE to >/= 17 seconds to equal age matched norms and display improvement in pt's LE strength and balance.  Baseline: 03/03/23: Deferred to next session  Goal status: INITIAL  4.  Pt will improve 6 MWT by at least 165' with <5/10NPS to demonstrate clinically significant improvement in tolerance for community walking distances.  Baseline: 03/03/23: Deferred to next session 03/15/23: 1,340ft w/ 5/10NPS  Goal status: INITIAL    PLAN:  PT FREQUENCY: 2x/week  PT DURATION: 8 weeks  PLANNED INTERVENTIONS: Therapeutic exercises, Therapeutic activity, Neuromuscular re-education, Balance training, Gait training, Patient/Family education, Self Care, Joint mobilization, Joint manipulation, Stair training, Electrical stimulation, Spinal manipulation, Spinal mobilization, Cryotherapy, Moist heat, Manual therapy, and Re-evaluation  PLAN FOR NEXT SESSION: Progress LLE strengthening exercises and hamstring flexibility   Lovie Macadamia, SPT 03/15/2023, 3:38 PM   Nolon Bussing, PT, DPT Physical Therapist - [facility]  03/15/23, 3:38 PM

## 2023-03-15 NOTE — Telephone Encounter (Signed)
Patient called and wanted Dr Darrick Huntsman to know that the DVT that was order, she is going to cancel. She is having a ultra sound tomorrow because her therapist thinks it is a bakers cyst in the back of her knee.

## 2023-03-15 NOTE — Telephone Encounter (Signed)
FYI

## 2023-03-16 ENCOUNTER — Ambulatory Visit
Admission: RE | Admit: 2023-03-16 | Discharge: 2023-03-16 | Disposition: A | Discharge status: Home / Self Care | Payer: Medicare PPO | Source: Ambulatory Visit | Attending: Internal Medicine | Admitting: Internal Medicine

## 2023-03-16 DIAGNOSIS — M7989 Other specified soft tissue disorders: Secondary | ICD-10-CM | POA: Diagnosis not present

## 2023-03-16 DIAGNOSIS — M25461 Effusion, right knee: Secondary | ICD-10-CM | POA: Diagnosis not present

## 2023-03-17 ENCOUNTER — Ambulatory Visit: Payer: Medicare PPO

## 2023-03-17 DIAGNOSIS — M25562 Pain in left knee: Secondary | ICD-10-CM | POA: Diagnosis not present

## 2023-03-17 DIAGNOSIS — M6281 Muscle weakness (generalized): Secondary | ICD-10-CM

## 2023-03-17 DIAGNOSIS — G8929 Other chronic pain: Secondary | ICD-10-CM

## 2023-03-17 NOTE — Therapy (Addendum)
OUTPATIENT PHYSICAL THERAPY LOWER EXTREMITY TREATMENT    Patient Name: Holly [name] MRN: [id number] DOB:1946-03-17, 77 y.o., female Today's Date: 03/17/2023  END OF SESSION:  PT End of Session - 03/17/23 1515     Visit Number 5    Number of Visits 17    Date for PT Re-Evaluation 04/28/23    PT Start Time 1517    PT Stop Time 1600    PT Time Calculation (min) 43 min    Activity Tolerance Patient tolerated treatment well    Behavior During Therapy [facility] for tasks assessed/performed             Past Medical History:  Diagnosis Date   Anxiety    Concussion with loss of consciousness 04/10/2017   Depression    Guillain Barr syndrome (HCC) 2005   Osteoporosis    Radial fracture 01/23/2013   Right arm just proximal to wrist. Secondary to fall. Patient has a history of osteoporosis and requires vitamin D and repeat DEXA scan if possible.    Squamous cell skin cancer 2011   Past Surgical History:  Procedure Laterality Date   ABDOMINAL HYSTERECTOMY  1989   APPENDECTOMY  1960   BREAST EXCISIONAL BIOPSY  1980   benign   BREAST SURGERY  1980   biopsy   WRIST FRACTURE SURGERY Right 2015   Patient Active Problem List   Diagnosis Date Noted   Left medial knee pain 03/03/2023   Elevated blood pressure reading without diagnosis of hypertension 03/02/2023   Dysgeusia 08/28/2022   Anosmia 08/28/2022   End of life care 08/25/2022   Living will in place 04/08/2022   Chronic insomnia 04/14/2021   B12 deficiency 12/09/2020   Peripheral neuropathy 12/06/2020   Bilateral foot pain 12/06/2020   Restless legs 11/27/2019   Autonomous neurogenic bladder 06/24/2019   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   Incomplete bladder emptying 01/11/2019   Eustachian tube disorder, right 11/22/2018   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   Encounter for preventive health examination 07/01/2015   Colon cancer screening 03/16/2013   Right leg weakness 01/23/2013   History of Guillain-Barre syndrome  01/23/2013   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 Pt states that she has had her ultrasound done, and is awaiting results.   PERTINENT HISTORY: Pt reports acute on chronic L knee pain. Pt was dx by her MD with OA in July 2023, and given a L steroid injection that has reduced her L knee pain up until this new onset. Her new onset pain began when pt shifted her LLE medially while sitting and heard a "pop" and immediate swelling in her L knee two months ago (July, 2024) and her pain has progressed since then. Pt has received another steroid injection since this point however, this did not relieve her pain very much. Pain reported at the posterior lateral> medial aspect of the L knee. Pain described as a dull ache. Pt's pain with no activity is 0/10 NPS and 4/10 NPS with activity. Pt denies radicular symptoms in  LLE. Pt has joint crepitus, with some popping and clicking in the knee joint. Pt's aggravating factors include prolonged walking, and stairs ascending> descending. Denies knee instability. The pain is relieved with her L knee steroid injections and OTC medications such as Tylenol.  PAIN:  Are you having pain? Yes: NPRS scale: 0/10 Pain location: Posterior medial and lateral aspect of L knee Pain description: dull ache, same pain throughout Aggravating factors: prolonged walking, stairs (going up more painful)  Relieving factors: Steroid, OTC medications Tylenol 4/10 NPS in L knee w/  activity  PRECAUTIONS: Fall  RED FLAGS: None   WEIGHT BEARING RESTRICTIONS: No  FALLS:  Has patient fallen in last 6 months? No  OCCUPATION: Retired  PLOF: Independent  PATIENT GOALS: Get some exercises that can help with R knee pain.   NEXT MD VISIT: N/A   OBJECTIVE:   DIAGNOSTIC FINDINGS: N/A  PATIENT SURVEYS:  FOTO 63/69  COGNITION: Overall cognitive status: Within functional limits for tasks assessed     SENSATION: WFL  EDEMA:  Circumferential: LLE: tibial tuberosity (38cm), popliteal fossa (42cm), superior pole (45cm). RLE: tibial tuberosity (39cm), popliteal fossa (43cm), superior pole (45cm).   MUSCLE LENGTH: Hamstrings: WFL  PALPATION: Increased swelling noted at lateral aspect of L knee, near distal hamstring attachment.  LOWER EXTREMITY ROM:  Active ROM Right eval Left eval  Knee flexion 130 AROM: 110* PROM: 118*  Knee extension 0 0   (Blank rows = not tested)  LOWER EXTREMITY MMT:  MMT Right eval Left eval  Hip flexion 4- 4-  Hip extension 2+ 2+  Hip abduction 4- 4-  Hip adduction 4- 4-  Hip internal rotation 4 4-  Hip external rotation 4 4-  Knee flexion 4 4  Knee extension 4 4  Ankle dorsiflexion 4 4  Ankle plantarflexion 4 4   (Blank rows = not tested)  LOWER EXTREMITY SPECIAL TESTS: (LLE) Knee special tests: Anterior drawer test: negative, Posterior drawer test: negative, and McMurray's test: negative Valgus stress test: negative, Varus stress test: negative  FUNCTIONAL TESTS:  5 times sit to stand: 11.99 sec SLS attempted: unable to complete bilaterally 2/2 weakness in bilat LE's, defer to next session   JOINT MOBILITY: LLE hypomobility noted in all planes. Typical patellar mobility noted in LLE.    TODAY'S TREATMENT: DATE: 03/17/23  There-ex:   Recumbent bike lvl 1.0 x 2 minutes, lvl 3.0 x 3 minutes seat 17 Standing hip abduction w/ GTB RLE/ LLE 2 x10/ each side  Standing hip extension w/ GTB RLE/LLE 2 x10/ each  side  Seated knee extension w/ green theraband 1 x12, blue TB 2 x12 LLE only Seated knee flexion w/ blue theraband 3 x12 LLE only Reverse clamshells w/ GTB 2 x10 Supine hamstring stretch 2 x30 sec RLE/LLE    PATIENT EDUCATION:  Education details: HEP given to pt Person educated: Patient Education method: Explanation Education comprehension: verbalized understanding and needs further education  HOME EXERCISE PROGRAM: Access Code: [email or web address] URL: [email or web address] Date: 03/03/2023 Prepared by: Ronnie Derby  Exercises - Supine Heel Slide  - 1 x daily - 7 x weekly - 2 sets - 12 reps - Clamshell  - 1 x daily - 3-4 x weekly - 3 sets - 8 reps - Prone Hip Extension  - 1 x daily - 3-4 x weekly - 3 sets - 8 reps - Mini Squat with Counter Support  - 1 x daily - 3-4 x weekly - 3 sets - 8 reps  ASSESSMENT:  CLINICAL  IMPRESSION: Session focused on continuing to progress bilat LE strengthening and L knee ROM exercises. Pt's L knee pain continues to be a limiting factor for ADL's and functional mobility. Pt is currently awaiting results of her posterior L knee ultrasound. Future sessions will continue to focus on bilat LE hip and L knee strengthening in a pain- free ROM. Pt would continue benefit from skilled PT services to address these deficits to reduce pain and maximize functional capacity with walking, steps, and household ADL's.   OBJECTIVE IMPAIRMENTS: decreased activity tolerance, decreased balance, decreased mobility, difficulty walking, decreased ROM, decreased strength, hypomobility, and pain.   ACTIVITY LIMITATIONS: carrying, lifting, and squatting  PARTICIPATION LIMITATIONS: cleaning and community activity  PERSONAL FACTORS: Age, Time since onset of injury/illness/exacerbation, and 1 comorbidity: L knee OA  are also affecting patient's functional outcome.   REHAB POTENTIAL: Good  CLINICAL DECISION MAKING: Stable/uncomplicated  EVALUATION COMPLEXITY:  Low   GOALS: Goals reviewed with patient? Yes  SHORT TERM GOALS: Target date: 03/31/2023 Pt will be independent with HEP to improve L knee strength and decrease pain with functional activities  Baseline: 03/03/23: HEP given to pt  Goal status: INITIAL   LONG TERM GOALS: Target date: 04/28/2023  Pt will improve FOTO to target score to demonstrate clinically significant improvement in functional mobility. Baseline: 03/03/23: 63/69 Goal status: INITIAL  2.  Pt will decrease L knee pain to </= 2/10 NPS to demonstrate clinically significant decrease in pain w/ activity. Baseline: 03/03/23: 4/10 NPS Goal status: INITIAL  3.  Pt will increase her SLS time on RLE/LLE to >/= 17 seconds to equal age matched norms and display improvement in pt's LE strength and balance.  Baseline: 03/03/23: Deferred to next session  Goal status: INITIAL  4.  Pt will improve 6 MWT by at least 165' with <5/10NPS to demonstrate clinically significant improvement in tolerance for community walking distances.  Baseline: 03/03/23: Deferred to next session 03/15/23: 1,325ft w/ 5/10NPS  Goal status: INITIAL    PLAN:  PT FREQUENCY: 2x/week  PT DURATION: 8 weeks  PLANNED INTERVENTIONS: Therapeutic exercises, Therapeutic activity, Neuromuscular re-education, Balance training, Gait training, Patient/Family education, Self Care, Joint mobilization, Joint manipulation, Stair training, Electrical stimulation, Spinal manipulation, Spinal mobilization, Cryotherapy, Moist heat, Manual therapy, and Re-evaluation  PLAN FOR NEXT SESSION: Progress LLE strengthening exercises and hamstring flexibility, discuss ultrasound results if pt has had it read yet  Lovie Macadamia, SPT  Delphia Grates. Fairly IV, PT, DPT Physical Therapist- [place]  [facility] 03/17/23, 8:16 PM

## 2023-03-21 NOTE — Telephone Encounter (Signed)
pt would like to be called concerning her knee

## 2023-03-22 DIAGNOSIS — M5416 Radiculopathy, lumbar region: Secondary | ICD-10-CM | POA: Diagnosis not present

## 2023-03-23 NOTE — Telephone Encounter (Signed)
Spoke with pt and she stated that she wanted to let you know that she went to [facility] and saw Dr. Martha Clan for her knee and sciatic nerve pain. She stated that they did an [place] of her knee but she has not gotten the result back yet.

## 2023-03-30 ENCOUNTER — Ambulatory Visit: Payer: Medicare PPO

## 2023-03-30 DIAGNOSIS — G8929 Other chronic pain: Secondary | ICD-10-CM | POA: Diagnosis not present

## 2023-03-30 DIAGNOSIS — M6281 Muscle weakness (generalized): Secondary | ICD-10-CM

## 2023-03-30 DIAGNOSIS — M25562 Pain in left knee: Secondary | ICD-10-CM | POA: Diagnosis not present

## 2023-03-30 NOTE — Therapy (Addendum)
OUTPATIENT PHYSICAL THERAPY LOWER EXTREMITY TREATMENT    Patient Name: Holly [name] MRN: [id number] DOB:Sep 27, 1945, 77 y.o., female Today's Date: 03/30/2023  END OF SESSION:  PT End of Session - 03/30/23 0946     Visit Number 6    Number of Visits 17    Date for PT Re-Evaluation 04/28/23    PT Start Time 0947    PT Stop Time 1029    PT Time Calculation (min) 42 min    Activity Tolerance Patient tolerated treatment well    Behavior During Therapy [facility] for tasks assessed/performed             Past Medical History:  Diagnosis Date   Anxiety    Concussion with loss of consciousness 04/10/2017   Depression    Guillain Barr syndrome [facility]) 2005   Osteoporosis    Radial fracture 01/23/2013   Right arm just proximal to wrist. Secondary to fall. Patient has a history of osteoporosis and requires vitamin D and repeat DEXA scan if possible.    Squamous cell skin cancer 2011   Past Surgical History:  Procedure Laterality Date   ABDOMINAL HYSTERECTOMY  1989   APPENDECTOMY  1960   BREAST EXCISIONAL BIOPSY  1980   benign   BREAST SURGERY  1980   biopsy   WRIST FRACTURE SURGERY Right 2015   Patient Active Problem List   Diagnosis Date Noted   Left medial knee pain 03/03/2023   Elevated blood pressure reading without diagnosis of hypertension 03/02/2023   Dysgeusia 08/28/2022   Anosmia 08/28/2022   End of life care 08/25/2022   Living will in place 04/08/2022   Chronic insomnia 04/14/2021   B12 deficiency 12/09/2020   Peripheral neuropathy 12/06/2020   Bilateral foot pain 12/06/2020   Restless legs 11/27/2019   Autonomous neurogenic bladder 06/24/2019   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   Incomplete bladder emptying 01/11/2019   Eustachian tube disorder, right 11/22/2018   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   Encounter for preventive health examination 07/01/2015   Colon cancer screening 03/16/2013   Right leg weakness 01/23/2013   History of Guillain-Barre syndrome  01/23/2013   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 Pt has been awaiting results of her ultrasound for 2 weeks now, and expresses frustration regarding her LLE pain.   PERTINENT HISTORY: Pt reports acute on chronic L knee pain. Pt was dx by her MD with OA in July 2023, and given a L steroid injection that has reduced her L knee pain up until this new onset. Her new onset pain began when pt shifted her LLE medially while sitting and heard a "pop" and immediate swelling in her L knee two months ago (July, 2024) and her pain has progressed since then. Pt has received another steroid injection since this point however, this did not relieve her pain very much. Pain reported at the posterior lateral> medial aspect of the L knee. Pain described as a dull ache. Pt's pain with no activity is 0/10 NPS and 4/10 NPS with  activity. Pt denies radicular symptoms in LLE. Pt has joint crepitus, with some popping and clicking in the knee joint. Pt's aggravating factors include prolonged walking, and stairs ascending> descending. Denies knee instability. The pain is relieved with her L knee steroid injections and OTC medications such as Tylenol.  PAIN:  Are you having pain? Yes: NPRS scale: 4/10 Pain location: Posterior medial and lateral aspect of L knee Pain description: dull ache, same pain throughout Aggravating factors: prolonged walking, stairs (going up more painful)  Relieving factors: Steroid, OTC  medications Tylenol 4/10 NPS in L knee w/ activity  PRECAUTIONS: Fall  RED FLAGS: None   WEIGHT BEARING RESTRICTIONS: No  FALLS:  Has patient fallen in last 6 months? No  OCCUPATION: Retired  PLOF: Independent  PATIENT GOALS: Get some exercises that can help with R knee pain.   NEXT MD VISIT: N/A   OBJECTIVE:   DIAGNOSTIC FINDINGS: N/A  PATIENT SURVEYS:  FOTO 63/69  COGNITION: Overall cognitive status: Within functional limits for tasks assessed     SENSATION: WFL  EDEMA:  Circumferential: LLE: tibial tuberosity (38cm), popliteal fossa (42cm), superior pole (45cm). RLE: tibial tuberosity (39cm), popliteal fossa (43cm), superior pole (45cm).   MUSCLE LENGTH: Hamstrings: WFL  PALPATION: Increased swelling noted at lateral aspect of L knee, near distal hamstring attachment.  LOWER EXTREMITY ROM:  Active ROM Right eval Left eval  Knee flexion 130 AROM: 110* PROM: 118*  Knee extension 0 0   (Blank rows = not tested)  LOWER EXTREMITY MMT:  MMT Right eval Left eval  Hip flexion 4- 4-  Hip extension 2+ 2+  Hip abduction 4- 4-  Hip adduction 4- 4-  Hip internal rotation 4 4-  Hip external rotation 4 4-  Knee flexion 4 4  Knee extension 4 4  Ankle dorsiflexion 4 4  Ankle plantarflexion 4 4   (Blank rows = not tested)  LOWER EXTREMITY SPECIAL TESTS: (LLE) Knee special tests: Anterior drawer test: negative, Posterior drawer test: negative, and McMurray's test: negative Valgus stress test: negative, Varus stress test: negative  FUNCTIONAL TESTS:  5 times sit to stand: 11.99 sec SLS attempted: unable to complete bilaterally 2/2 weakness in bilat LE's, defer to next session   JOINT MOBILITY: LLE hypomobility noted in all planes. Typical patellar mobility noted in LLE.    TODAY'S TREATMENT: DATE: 03/30/23  There-ex:   Recumbent bike lvl 0 seat 17 x 5 minutes to improve L knee mobility/ AROM  Standing hip abduction w/ 25# at MATRIX machine RLE/ LLE  3 x10/ each side  Standing hip extension w/ 70# at MATRIX machine  RLE/LLE 3 x10/ each side  Standing mini-squats onto elevated plinth w/ 5.3 lbs medicine ball 2 x10  Seated knee extension w/ blue theraband 3 x12,  LLE only Seated knee flexion w/ blue theraband 3 x12 LLE only    PATIENT EDUCATION:  Education details: HEP given to pt Person educated: Patient Education method: Explanation Education comprehension: verbalized understanding and needs further education  HOME EXERCISE PROGRAM: Access Code: [email or web address] URL: [email or web address] Date: 03/03/2023 Prepared by: Ronnie Derby  Exercises - Supine Heel Slide  - 1 x daily - 7 x weekly - 2 sets - 12 reps - Clamshell  - 1 x daily - 3-4 x weekly - 3 sets - 8 reps - Prone Hip Extension  - 1 x daily - 3-4 x weekly - 3 sets - 8 reps - Mini Squat with Counter Support  - 1 x daily - 3-4  x weekly - 3 sets - 8 reps  ASSESSMENT:  CLINICAL IMPRESSION: Session focused on continuing to progress bilat LE strengthening and L knee ROM exercises. Pt continues to have notable LLE pain radiating down the lateral LLE along with during L knee flexion. This pain is limiting the pt from prolonged walking and standing activities during her ADL's. Pt notes progression in proximal hip strength noted w/ improved activity tolerance to MATRIX machine bilat hip abduction and extension exercises. Pt awaiting results from previous ultrasound, which will aid in proper exercise prescription and rehabilitation moving forward. Pt would continue benefit from skilled PT services to address these deficits to reduce pain and maximize functional capacity with walking, steps, and household ADL's.   OBJECTIVE IMPAIRMENTS: decreased activity tolerance, decreased balance, decreased mobility, difficulty walking, decreased ROM, decreased strength, hypomobility, and pain.   ACTIVITY LIMITATIONS: carrying, lifting, and squatting  PARTICIPATION LIMITATIONS: cleaning and  community activity  PERSONAL FACTORS: Age, Time since onset of injury/illness/exacerbation, and 1 comorbidity: L knee OA  are also affecting patient's functional outcome.   REHAB POTENTIAL: Good  CLINICAL DECISION MAKING: Stable/uncomplicated  EVALUATION COMPLEXITY: Low   GOALS: Goals reviewed with patient? Yes  SHORT TERM GOALS: Target date: 03/31/2023 Pt will be independent with HEP to improve L knee strength and decrease pain with functional activities  Baseline: 03/03/23: HEP given to pt  Goal status: INITIAL   LONG TERM GOALS: Target date: 04/28/2023  Pt will improve FOTO to target score to demonstrate clinically significant improvement in functional mobility. Baseline: 03/03/23: 63/69 Goal status: INITIAL  2.  Pt will decrease L knee pain to </= 2/10 NPS to demonstrate clinically significant decrease in pain w/ activity. Baseline: 03/03/23: 4/10 NPS Goal status: INITIAL  3.  Pt will increase her SLS time on RLE/LLE to >/= 17 seconds to equal age matched norms and display improvement in pt's LE strength and balance.  Baseline: 03/03/23: Deferred to next session  Goal status: INITIAL  4.  Pt will improve 6 MWT by at least 165' with <5/10NPS to demonstrate clinically significant improvement in tolerance for community walking distances.  Baseline: 03/03/23: Deferred to next session 03/15/23: 1,336ft w/ 5/10NPS  Goal status: INITIAL    PLAN:  PT FREQUENCY: 2x/week  PT DURATION: 8 weeks  PLANNED INTERVENTIONS: Therapeutic exercises, Therapeutic activity, Neuromuscular re-education, Balance training, Gait training, Patient/Family education, Self Care, Joint mobilization, Joint manipulation, Stair training, Electrical stimulation, Spinal manipulation, Spinal mobilization, Cryotherapy, Moist heat, Manual therapy, and Re-evaluation  PLAN FOR NEXT SESSION: Discuss ultrasound results if pt has had it read yet, progress L knee AROM and strengthening exercises.  Lovie Macadamia,  SPT  Delphia Grates. Fairly IV, PT, DPT Physical Therapist- [facility] 03/30/23, 12:54 PM

## 2023-03-31 ENCOUNTER — Encounter: Payer: Self-pay | Admitting: Internal Medicine

## 2023-03-31 ENCOUNTER — Telehealth: Payer: Self-pay | Admitting: Internal Medicine

## 2023-03-31 NOTE — Telephone Encounter (Signed)
Patients states got an ultrasound two weeks ago and still no results. She says she is in pain. She states she called imaging and they said to have pcp call to see if results can be rushed. Please call patient

## 2023-03-31 NOTE — Telephone Encounter (Signed)
Looks like [place] was resulted as of today.

## 2023-04-01 ENCOUNTER — Ambulatory Visit: Payer: Medicare PPO

## 2023-04-01 DIAGNOSIS — G8929 Other chronic pain: Secondary | ICD-10-CM

## 2023-04-01 DIAGNOSIS — M6281 Muscle weakness (generalized): Secondary | ICD-10-CM | POA: Diagnosis not present

## 2023-04-01 DIAGNOSIS — M25562 Pain in left knee: Secondary | ICD-10-CM | POA: Diagnosis not present

## 2023-04-01 NOTE — Therapy (Addendum)
OUTPATIENT PHYSICAL THERAPY LOWER EXTREMITY TREATMENT    Patient Name: Holly [name] MRN: [id number] DOB:[birth year], 77 y.o., female Today's Date: 04/01/2023  END OF SESSION:  PT End of Session - 04/01/23 1117     Visit Number 7    Number of Visits 17    Date for PT Re-Evaluation 04/28/23    PT Start Time 1116    PT Stop Time 1158    PT Time Calculation (min) 42 min    Activity Tolerance Patient tolerated treatment well    Behavior During Therapy [facility] for tasks assessed/performed             Past Medical History:  Diagnosis Date   Anxiety    Concussion with loss of consciousness 04/10/2017   Depression    Guillain Barr syndrome (HCC) 2005   Osteoporosis    Radial fracture 01/23/2013   Right arm just proximal to wrist. Secondary to fall. Patient has a history of osteoporosis and requires vitamin D and repeat DEXA scan if possible.    Squamous cell skin cancer 2011   Past Surgical History:  Procedure Laterality Date   ABDOMINAL HYSTERECTOMY  1989   APPENDECTOMY  1960   BREAST EXCISIONAL BIOPSY  1980   benign   BREAST SURGERY  1980   biopsy   WRIST FRACTURE SURGERY Right 2015   Patient Active Problem List   Diagnosis Date Noted   Left medial knee pain 03/03/2023   Elevated blood pressure reading without diagnosis of hypertension 03/02/2023   Dysgeusia 08/28/2022   Anosmia 08/28/2022   End of life care 08/25/2022   Living will in place 04/08/2022   Chronic insomnia 04/14/2021   B12 deficiency 12/09/2020   Peripheral neuropathy 12/06/2020   Bilateral foot pain 12/06/2020   Restless legs 11/27/2019   Autonomous neurogenic bladder 06/24/2019   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   Incomplete bladder emptying 01/11/2019   Eustachian tube disorder, right 11/22/2018   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   Encounter for preventive health examination 07/01/2015   Colon cancer screening 03/16/2013   Right leg weakness 01/23/2013   History of Guillain-Barre syndrome  01/23/2013   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 Pt received her ultrasound results and has messaged her doctor to hopefully receive an MRI.   PERTINENT HISTORY: Pt reports acute on chronic L knee pain. Pt was dx by her MD with OA in July 2023, and given a L steroid injection that has reduced her L knee pain up until this new onset. Her new onset pain began when pt shifted her LLE medially while sitting and heard a "pop" and immediate swelling in her L knee two months ago (July, 2024) and her pain has progressed since then. Pt has received another steroid injection since this point however, this did not relieve her pain very much. Pain reported at the posterior lateral> medial aspect of the L knee. Pain described as a dull ache. Pt's pain with no activity is 0/10 NPS and 4/10 NPS with activity. Pt denies radicular  symptoms in LLE. Pt has joint crepitus, with some popping and clicking in the knee joint. Pt's aggravating factors include prolonged walking, and stairs ascending> descending. Denies knee instability. The pain is relieved with her L knee steroid injections and OTC medications such as Tylenol.  PAIN:  Are you having pain? Yes: NPRS scale: 4/10 Pain location: Posterior medial and lateral aspect of L knee Pain description: dull ache, same pain throughout Aggravating factors: prolonged walking, stairs (going up more painful)  Relieving factors: Steroid, OTC medications Tylenol 4/10  NPS in L knee w/ activity  PRECAUTIONS: Fall  RED FLAGS: None   WEIGHT BEARING RESTRICTIONS: No  FALLS:  Has patient fallen in last 6 months? No  OCCUPATION: Retired  PLOF: Independent  PATIENT GOALS: Get some exercises that can help with R knee pain.   NEXT MD VISIT: N/A   OBJECTIVE:   DIAGNOSTIC FINDINGS: 03/31/23  CLINICAL DATA:  Popliteal swelling   EXAM: ULTRASOUND LEFT LOWER EXTREMITY LIMITED   TECHNIQUE: Ultrasound examination of the lower extremity soft tissues was performed in the area of clinical concern.   COMPARISON:  None Available.   FINDINGS: Targeted ultrasound was performed of the soft tissues of the left lower extremity at site of patient's clinical concern. There is a complex hypoechoic structure in the left popliteal fossa measuring 1.9 x 1.1 x 1.3 cm. No internal vascularity. No surrounding soft tissue edema or hyperemia.   IMPRESSION: Complex hypoechoic structure in the left popliteal fossa measuring 1.9 x 1.1 x 1.3 cm, favored to represent a complex Baker's cyst or ganglion. MRI could be obtained to confirm, if clinically indicated.    PATIENT SURVEYS:  FOTO 63/69  COGNITION: Overall cognitive status: Within functional limits for tasks assessed     SENSATION: WFL  EDEMA:  Circumferential: LLE: tibial tuberosity (38cm), popliteal fossa (42cm), superior pole (45cm). RLE: tibial tuberosity (39cm), popliteal fossa (43cm), superior pole (45cm).   MUSCLE LENGTH: Hamstrings: WFL  PALPATION: Increased swelling noted at lateral aspect of L knee, near distal hamstring attachment.  LOWER EXTREMITY ROM:  Active ROM Right eval Left eval Left 04/01/23  Knee flexion 130 AROM: 110* PROM: 118* AROM: 87 deg* PROM: 97 deg*    Knee extension 0 0    (Blank rows = not tested)  LOWER EXTREMITY MMT:  MMT Right eval Left eval  Hip flexion 4- 4-  Hip extension 2+ 2+  Hip abduction 4- 4-  Hip adduction 4- 4-  Hip internal rotation 4 4-   Hip external rotation 4 4-  Knee flexion 4 4  Knee extension 4 4  Ankle dorsiflexion 4 4  Ankle plantarflexion 4 4   (Blank rows = not tested)  LOWER EXTREMITY SPECIAL TESTS: (LLE) Knee special tests: Anterior drawer test: negative, Posterior drawer test: negative, and McMurray's test: negative Valgus stress test: negative, Varus stress test: negative  FUNCTIONAL TESTS:  5 times sit to stand: 11.99 sec SLS attempted: unable to complete bilaterally 2/2 weakness in bilat LE's, defer to next session   JOINT MOBILITY: LLE hypomobility noted in all planes. Typical patellar mobility noted in LLE.    TODAY'S TREATMENT: DATE: 04/01/23  Session spent reviewing goals as pt discharging at today's session. See clinical impression and goals section for details.    PATIENT EDUCATION:  Education details: HEP given to pt Person educated: Patient Education method: Explanation Education comprehension: verbalized understanding and needs further education  HOME EXERCISE PROGRAM: Access Code: [email or web address] URL: [email or web address] Date: 03/03/2023 Prepared by: Ronnie Derby  Exercises - Supine Heel Slide  - 1 x daily - 7 x weekly - 2 sets - 12 reps - Clamshell  - 1 x daily - 3-4 x weekly - 3 sets - 8 reps - Prone Hip Extension  - 1 x daily - 3-4 x weekly - 3 sets - 8 reps - Mini Squat with Counter Support  - 1 x daily - 3-4 x weekly - 3 sets - 8 reps  ASSESSMENT:  CLINICAL IMPRESSION: Session focused on reassessing pt's STG and LTG's as pt is discharging from physical therapy today. After seven physical therapy sessions, pt's posterior L knee pain has continued to significantly impact her ability to progress in PT. Upon review of pt's goals, pt is unable to complete her HEP 2/2 increased posterior L knee pain with knee flexion, her overall L knee pain levels have increased, preventing her from achieving her SLS goal, and noting increased pain following . Pt also notes  decreased L knee AROM/ PROM w/ concordant pain, in comparison to her initial evaluation (see above). Pt and PT agree that pt would benefit from following up with her doctor for a potential MRI, before returning to PT as pt's current condition is non- musculoskeletal in nature. Pt discharged at today's session.   OBJECTIVE IMPAIRMENTS: decreased activity tolerance, decreased balance, decreased mobility, difficulty walking, decreased ROM, decreased strength, hypomobility, and pain.   ACTIVITY LIMITATIONS: carrying, lifting, and squatting  PARTICIPATION LIMITATIONS: cleaning and community activity  PERSONAL FACTORS: Age, Time since onset of injury/illness/exacerbation, and 1 comorbidity: L knee OA  are also affecting patient's functional outcome.   REHAB POTENTIAL: Good  CLINICAL DECISION MAKING: Stable/uncomplicated  EVALUATION COMPLEXITY: Low   GOALS: Goals reviewed with patient? Yes  SHORT TERM GOALS: Target date: 03/31/2023 Pt will be independent with HEP to improve L knee strength and decrease pain with functional activities  Baseline: 03/03/23: HEP given to pt 04/01/23: Difficulty completing 2/2 to pain with any knee flexion exercises  Goal status: NOT MET   LONG TERM GOALS: Target date: 04/28/2023  Pt will improve FOTO to target score to demonstrate clinically significant improvement in functional mobility. Baseline: 03/03/23: 63/69 04/01/23: 38/69 Goal status: NOT MET  2.  Pt will decrease L knee pain to </= 2/10 NPS to demonstrate clinically significant decrease in pain w/ activity. Baseline: 03/03/23: 4/10 NPS 04/01/23: 4-7/10 NPS  Goal status: NOT MET  3.  Pt will increase her SLS time on RLE/LLE to >/= 17 seconds to equal age matched norms and display improvement in pt's LE strength and balance.  Baseline: 03/03/23: Deferred to next session 04/01/23- R: 5.9 seconds L: 2.3 seconds Goal status: NOT MET  4.  Pt will improve 6 MWT by at least 165' with <5/10NPS to demonstrate  clinically significant improvement in tolerance for community walking distances.  Baseline: 03/03/23: Deferred to next session 03/15/23: 1,374ft w/ 5/10NPS 04/01/23: 1,334ft w/ 6.5/10 NPS  Goal status: NOT MET    PLAN:  PT FREQUENCY: 2x/week  PT DURATION: 8 weeks  PLANNED INTERVENTIONS: Therapeutic exercises, Therapeutic activity, Neuromuscular re-education, Balance training, Gait training, Patient/Family education, Self Care, Joint mobilization, Joint manipulation, Stair training, Electrical stimulation, Spinal manipulation, Spinal mobilization, Cryotherapy, Moist heat, Manual therapy, and Re-evaluation  PLAN FOR NEXT SESSION: Pt discharged at today's session.   Lovie Macadamia, SPT  Delphia Grates. Fairly IV, PT, DPT Physical Therapist- [facility] 04/01/23, 12:33 PM

## 2023-04-01 NOTE — Telephone Encounter (Signed)
See result note message. Pt was notified through mychart.

## 2023-04-04 ENCOUNTER — Ambulatory Visit: Payer: Medicare PPO

## 2023-04-06 DIAGNOSIS — M1712 Unilateral primary osteoarthritis, left knee: Secondary | ICD-10-CM | POA: Diagnosis not present

## 2023-04-06 DIAGNOSIS — M5416 Radiculopathy, lumbar region: Secondary | ICD-10-CM | POA: Diagnosis not present

## 2023-04-06 DIAGNOSIS — M2242 Chondromalacia patellae, left knee: Secondary | ICD-10-CM | POA: Diagnosis not present

## 2023-04-08 ENCOUNTER — Ambulatory Visit: Payer: Medicare PPO | Admitting: Internal Medicine

## 2023-04-08 ENCOUNTER — Encounter: Payer: Self-pay | Admitting: Internal Medicine

## 2023-04-08 VITALS — BP 128/84 | HR 70 | Temp 97.7°F | Ht 66.0 in | Wt 184.6 lb

## 2023-04-08 DIAGNOSIS — M5432 Sciatica, left side: Secondary | ICD-10-CM | POA: Diagnosis not present

## 2023-04-08 DIAGNOSIS — M7122 Synovial cyst of popliteal space [Baker], left knee: Secondary | ICD-10-CM

## 2023-04-08 DIAGNOSIS — M5416 Radiculopathy, lumbar region: Secondary | ICD-10-CM

## 2023-04-08 DIAGNOSIS — M25562 Pain in left knee: Secondary | ICD-10-CM

## 2023-04-08 NOTE — Patient Instructions (Signed)
  You should be taking up to 2000 mg of acetominophen (tylenol) every day safely  In divided doses (500 mg every 6 hours  Or 1000 mg every 12 hours.)  and add the meloxicam if needed   PT referral is in progress for the spine,  and MRI knee will be ordered

## 2023-04-08 NOTE — Progress Notes (Unsigned)
Subjective:  Patient ID: Holly [name], female    DOB: 29-Jul-1945  Age: 77 y.o. MRN: [id number]  CC: The primary encounter diagnosis was Left medial knee pain. Diagnoses of Chronic left-sided lumbar radiculopathy, Popliteal cyst, left, and Sciatica without back pain, left were also pertinent to this visit.   HPI Holly [name] presents for  Chief Complaint  Patient presents with   Medical Management of Chronic Issues    1) left knee swelling and pain for 2 months  following a painful "pop" .Marland Kitchen  Went to [facility]  and received a steroid injection in the knee by a PA in August  had a repeat eval by KK on Sept 11. For persistent symptoms.  Referred for  physical therapy ordered by Juanell Fairly (6 sessions ordered - first session was Sept 19 ,.  PT felt she had a Baker's Cyst bc  pain increased along with swelling and restricted flexion of knee ,  so she was referred for ultrasound which ruled out DVT and noted a Baker's Cyst.    During the interim she has developed left sided lateral and posterior hip pain that radiated to lower leg.  Saw KK again,  plain films of lumbar spine which he diagnosed as "likely the  result of lumbar radiculopathy. Her X-ray films today of the lumbar spine showed advanced multilevel facet arthrosis with neuroforaminal narrowing at L4-5 and L5-S1..  he gave her a Medrol dose pack, which did not helped at all.  Heis note states that he  referred her  to physical therapy specifically for her lumbar spine but the order was for left knee  . S he followed  up with KK on oct 23,  reported  that PT felt there was a Baker's Cyst and pt referral was corrected and made for the lumbar spine.  She is frustrated with Dr Martha Clan and felt that she was not taken seriously .    Had follow up on Oct 23 and told her that the longer she waited,  the less options she had . She felt 'brushed off and talked down to.    She states today that she has not had sciatica for 2 days,  since she  prayed for healing.  She would like to have a second opinion on the knee by a [facility]ist AND CONTINUE local  PT FOR HER BACK    she is    Outpatient Medications Prior to Visit  Medication Sig Dispense Refill   ARIPiprazole (ABILIFY) 5 MG tablet Take 5 mg by mouth at bedtime.     Cholecalciferol (VITAMIN D) 50 MCG (2000 UT) CAPS Take 1 capsule by mouth daily.     clorazepate (TRANXENE) 7.5 MG tablet Take 7.5 mg by mouth 3 (three) times daily.     desipramine (NORPRAMIN) 25 MG tablet Take 50 mg by mouth every morning.     No facility-administered medications prior to visit.    Review of Systems;  Patient denies headache, fevers, malaise, unintentional weight loss, skin rash, eye pain, sinus congestion and sinus pain, sore throat, dysphagia,  hemoptysis , cough, dyspnea, wheezing, chest pain, palpitations, orthopnea, edema, abdominal pain, nausea, melena, diarrhea, constipation, flank pain, dysuria, hematuria, urinary  Frequency, nocturia, numbness, tingling, seizures,  Focal weakness, Loss of consciousness,  Tremor, insomnia, depression, anxiety, and suicidal ideation.      Objective:  BP 128/84   Pulse 70   Temp 97.7 F (36.5 C)   Ht 5\' 6"  (1.676 m)  Wt 184 lb 9.6 oz (83.7 kg)   SpO2 98%   BMI 29.80 kg/m   BP Readings from Last 3 Encounters:  04/08/23 128/84  03/02/23 (!) 142/84  08/25/22 (!) 150/96    Wt Readings from Last 3 Encounters:  04/08/23 184 lb 9.6 oz (83.7 kg)  03/02/23 182 lb (82.6 kg)  02/08/23 169 lb (76.7 kg)    Physical Exam Vitals reviewed.  Constitutional:      General: She is not in acute distress.    Appearance: Normal appearance. She is normal weight. She is not ill-appearing, toxic-appearing or diaphoretic.  HENT:     Head: Normocephalic.  Eyes:     General: No scleral icterus.       Right eye: No discharge.        Left eye: No discharge.     Conjunctiva/sclera: Conjunctivae normal.  Cardiovascular:     Rate and Rhythm: Normal rate  and regular rhythm.     Heart sounds: Normal heart sounds.  Pulmonary:     Effort: Pulmonary effort is normal. No respiratory distress.     Breath sounds: Normal breath sounds.  Musculoskeletal:        General: Normal range of motion.  Skin:    General: Skin is warm and dry.  Neurological:     General: No focal deficit present.     Mental Status: She is alert and oriented to person, place, and time. Mental status is at baseline.  Psychiatric:        Mood and Affect: Mood normal.        Behavior: Behavior normal.        Thought Content: Thought content normal.        Judgment: Judgment normal.    Lab Results  Component Value Date   HGBA1C 5.7 03/02/2023   HGBA1C 5.9 04/08/2022   HGBA1C 5.9 12/03/2020    Lab Results  Component Value Date   CREATININE 0.92 03/02/2023   CREATININE 0.89 04/08/2022   CREATININE 0.94 12/03/2020    Lab Results  Component Value Date   WBC 6.9 03/02/2023   HGB 12.1 03/02/2023   HCT 37.2 03/02/2023   PLT 269.0 03/02/2023   GLUCOSE 88 03/02/2023   CHOL 222 (H) 03/02/2023   TRIG 68.0 03/02/2023   HDL 98.50 03/02/2023   LDLDIRECT 112.0 03/02/2023   LDLCALC 110 (H) 03/02/2023   ALT 11 03/02/2023   AST 18 03/02/2023   NA 140 03/02/2023   K 4.2 03/02/2023   CL 104 03/02/2023   CREATININE 0.92 03/02/2023   BUN 20 03/02/2023   CO2 28 03/02/2023   TSH 1.59 04/08/2022   INR 0.9 12/24/2012   HGBA1C 5.7 03/02/2023    [place] LT LOWER EXTREM LTD SOFT TISSUE NON VASCULAR  Result Date: 03/31/2023 CLINICAL DATA:  Popliteal swelling EXAM: ULTRASOUND LEFT LOWER EXTREMITY LIMITED TECHNIQUE: Ultrasound examination of the lower extremity soft tissues was performed in the area of clinical concern. COMPARISON:  None Available. FINDINGS: Targeted ultrasound was performed of the soft tissues of the left lower extremity at site of patient's clinical concern. There is a complex hypoechoic structure in the left popliteal fossa measuring 1.9 x 1.1 x 1.3 cm. No  internal vascularity. No surrounding soft tissue edema or hyperemia. IMPRESSION: Complex hypoechoic structure in the left popliteal fossa measuring 1.9 x 1.1 x 1.3 cm, favored to represent a complex Baker's cyst or ganglion. MRI could be obtained to confirm, if clinically indicated. Electronically Signed   By: Janyth Pupa  Plundo D.O.   On: 03/31/2023 14:57    Assessment & Plan:  .Left medial knee pain Assessment & Plan: Complicated by ppopliteal cyst.  No improvement with PT>  MRI knee ordered with plans to refer for a second opinion pending results   Orders: -     MR KNEE LEFT W WO CONTRAST; Future  Chronic left-sided lumbar radiculopathy -     Ambulatory referral to Physical Therapy  Popliteal cyst, left -     MR KNEE LEFT W WO CONTRAST; Future  Sciatica without back pain, left Assessment & Plan: Plain films of lower spine reviewed:  she has scoliosis and DDD.  PT for back strengthening exercises       I provided 40 minutes of face-to-face time during this encounter reviewing patient's last visit with me, patient's  most recent visit with orthoepdics and physical therapy , recent surgical and non surgical procedures, previous  labs and imaging studies, counseling on currently addressed issues,  and post visit ordering to diagnostics and therapeutics .   Follow-up: No follow-ups on file.   Sherlene Shams, MD

## 2023-04-10 DIAGNOSIS — M5432 Sciatica, left side: Secondary | ICD-10-CM | POA: Insufficient documentation

## 2023-04-10 NOTE — Assessment & Plan Note (Addendum)
Complicated by ppopliteal cyst.  No improvement with PT>  MRI knee ordered with plans to refer for a second opinion pending results

## 2023-04-10 NOTE — Assessment & Plan Note (Signed)
Plain films of lower spine reviewed:  she has scoliosis and DDD.  PT for back strengthening exercises

## 2023-04-11 ENCOUNTER — Ambulatory Visit: Payer: Medicare PPO

## 2023-04-11 ENCOUNTER — Encounter: Payer: Self-pay | Admitting: Internal Medicine

## 2023-04-11 ENCOUNTER — Telehealth: Payer: Self-pay

## 2023-04-11 DIAGNOSIS — M25562 Pain in left knee: Secondary | ICD-10-CM

## 2023-04-11 NOTE — Telephone Encounter (Signed)
Called pt to discuss missed appt today, pt stated she forgot about appt. Discussed with pt we will call her if any other appt times come available this week.

## 2023-04-12 NOTE — Telephone Encounter (Signed)
Yes her MRI is scheduled for Friday November 1st.

## 2023-04-15 ENCOUNTER — Ambulatory Visit
Admission: RE | Admit: 2023-04-15 | Discharge: 2023-04-15 | Disposition: A | Discharge status: Home / Self Care | Payer: Medicare PPO | Source: Ambulatory Visit | Attending: Internal Medicine | Admitting: Internal Medicine

## 2023-04-15 DIAGNOSIS — M7122 Synovial cyst of popliteal space [Baker], left knee: Secondary | ICD-10-CM | POA: Diagnosis not present

## 2023-04-15 DIAGNOSIS — M25562 Pain in left knee: Secondary | ICD-10-CM | POA: Insufficient documentation

## 2023-04-15 DIAGNOSIS — S83272A Complex tear of lateral meniscus, current injury, left knee, initial encounter: Secondary | ICD-10-CM | POA: Diagnosis not present

## 2023-04-15 DIAGNOSIS — R609 Edema, unspecified: Secondary | ICD-10-CM | POA: Diagnosis not present

## 2023-04-15 DIAGNOSIS — M25462 Effusion, left knee: Secondary | ICD-10-CM | POA: Diagnosis not present

## 2023-04-15 DIAGNOSIS — M1712 Unilateral primary osteoarthritis, left knee: Secondary | ICD-10-CM | POA: Diagnosis not present

## 2023-04-15 MED ORDER — GADOBUTROL 1 MMOL/ML IV SOLN
8.0000 mL | Freq: Once | INTRAVENOUS | Status: AC | PRN
  Administered 2023-04-15: 8 mL via INTRAVENOUS

## 2023-04-21 DIAGNOSIS — M1712 Unilateral primary osteoarthritis, left knee: Secondary | ICD-10-CM | POA: Diagnosis not present

## 2023-04-21 DIAGNOSIS — G650 Sequelae of Guillain-Barre syndrome: Secondary | ICD-10-CM | POA: Diagnosis not present

## 2023-04-21 DIAGNOSIS — M25562 Pain in left knee: Secondary | ICD-10-CM | POA: Diagnosis not present

## 2023-04-28 ENCOUNTER — Telehealth: Payer: Self-pay | Admitting: Physical Therapy

## 2023-04-28 NOTE — Telephone Encounter (Signed)
Called pt to inquire about upcoming eval. Pt reports that her left knee pain is under control after receiving recent coritsone shot and that she is not experiencing sciatica right now. Pt advised to hold on physical therapy for now.

## 2023-05-02 ENCOUNTER — Ambulatory Visit: Payer: Medicare PPO | Attending: Orthopedic Surgery | Admitting: Physical Therapy

## 2023-05-04 ENCOUNTER — Encounter: Payer: Medicare PPO | Admitting: Physical Therapy

## 2023-05-04 DIAGNOSIS — F330 Major depressive disorder, recurrent, mild: Secondary | ICD-10-CM | POA: Diagnosis not present

## 2023-05-18 ENCOUNTER — Encounter: Payer: Medicare PPO | Admitting: Physical Therapy

## 2023-05-25 ENCOUNTER — Encounter: Payer: Medicare PPO | Admitting: Physical Therapy

## 2023-05-31 ENCOUNTER — Encounter: Payer: Medicare PPO | Admitting: Physical Therapy

## 2023-06-02 ENCOUNTER — Encounter: Payer: Medicare PPO | Admitting: Physical Therapy

## 2023-06-03 ENCOUNTER — Ambulatory Visit (INDEPENDENT_AMBULATORY_CARE_PROVIDER_SITE_OTHER): Payer: Medicare PPO | Admitting: Internal Medicine

## 2023-06-03 ENCOUNTER — Encounter: Payer: Self-pay | Admitting: Internal Medicine

## 2023-06-03 VITALS — BP 132/82 | HR 75 | Ht 66.0 in | Wt 185.8 lb

## 2023-06-03 DIAGNOSIS — H6123 Impacted cerumen, bilateral: Secondary | ICD-10-CM | POA: Diagnosis not present

## 2023-06-03 DIAGNOSIS — M25562 Pain in left knee: Secondary | ICD-10-CM | POA: Diagnosis not present

## 2023-06-03 DIAGNOSIS — H6120 Impacted cerumen, unspecified ear: Secondary | ICD-10-CM | POA: Insufficient documentation

## 2023-06-03 DIAGNOSIS — Z0000 Encounter for general adult medical examination without abnormal findings: Secondary | ICD-10-CM | POA: Diagnosis not present

## 2023-06-03 DIAGNOSIS — R030 Elevated blood-pressure reading, without diagnosis of hypertension: Secondary | ICD-10-CM | POA: Diagnosis not present

## 2023-06-03 DIAGNOSIS — Z85828 Personal history of other malignant neoplasm of skin: Secondary | ICD-10-CM | POA: Diagnosis not present

## 2023-06-03 DIAGNOSIS — Z1231 Encounter for screening mammogram for malignant neoplasm of breast: Secondary | ICD-10-CM | POA: Diagnosis not present

## 2023-06-03 NOTE — Assessment & Plan Note (Addendum)
The I/A  injection by [facility] has nearly resolved her pain now on a scale  of 1/10 without medication .  She is not using tylenol or tramadol at all.  She is restricting hr acitivites and was not prescribed any additional PT exercise

## 2023-06-03 NOTE — Patient Instructions (Addendum)
Ok to continue 2000 IUs of D3 for the  next 4 months and then reduce your daily  dose to 1000 international units once you  start spending time in  the sun   Referral to Dr Skip Mayer is in progress  Your annual mammogram has been ordered AND IS DUE .  [facility] will not allow [place] to schedule it for you,  so please  call to make your appointment [phone]    Both ears are a bit plugged ,  try using the ear drops for a few days and if no luck you can call  the office to schedule  an ear cleaning    May the Lord give you peace and joy during this holiday season, and may the promise of His return bring you comfort and hope for the future.

## 2023-06-03 NOTE — Assessment & Plan Note (Signed)
She has no history of hypertension but has had several elevated readings IN THE OFFICE  HOME READINGS HAVE BEEN < 130/80 .  No indication for medications at this time .    Lab Results  Component Value Date   CREATININE 0.92 03/02/2023   Lab Results  Component Value Date   NA 140 03/02/2023   K 4.2 03/02/2023   CL 104 03/02/2023   CO2 28 03/02/2023

## 2023-06-03 NOTE — Progress Notes (Unsigned)
Patient ID: Holly [name], female    DOB: January 31, 1946  Age: 77 y.o. MRN: [id number]  The patient is here for annual preventive examination and management of other chronic and acute problems.   The risk factors are reflected in the social history.   The roster of all physicians providing medical care to patient - is listed in the Snapshot section of the chart.   Activities of daily living:  The patient is 100% independent in all ADLs: dressing, toileting, feeding as well as independent mobility   Home safety : The patient has smoke detectors in the home. They wear seatbelts.  There are no unsecured firearms at home. There is no violence in the home.    There is no risks for hepatitis, STDs or HIV. There is no   history of blood transfusion. They have no travel history to infectious disease endemic areas of the world.   The patient has seen their dentist in the last six month. They have seen their eye doctor in the last year. The patinet  denies slight hearing difficulty with regard to whispered voices and some television programs.  They have deferred audiologic testing in the last year.  They do not  have excessive sun exposure. Discussed the need for sun protection: hats, long sleeves and use of sunscreen if there is significant sun exposure.    Diet: the importance of a healthy diet is discussed. They do have a healthy diet.   The benefits of regular aerobic exercise were discussed. The patient  exercises  3 to 5 days per week  for  60 minutes.    Depression screen: there are no signs or vegative symptoms of depression- irritability, change in appetite, anhedonia, sadness/tearfullness.   The following portions of the patient's history were reviewed and updated as appropriate: allergies, current medications, past family history, past medical history,  past surgical history, past social history  and problem list.   Visual acuity was not assessed per patient preference since the patient has  regular follow up with an  ophthalmologist. Hearing and body mass index were assessed and reviewed.    During the course of the visit the patient was educated and counseled about appropriate screening and preventive services including : fall prevention , diabetes screening, nutrition counseling, colorectal cancer screening, and recommended immunizations.    Chief Complaint:  None     Review of Symptoms  Patient denies headache, fevers, malaise, unintentional weight loss, skin rash, eye pain, sinus congestion and sinus pain, sore throat, dysphagia,  hemoptysis , cough, dyspnea, wheezing, chest pain, palpitations, orthopnea, edema, abdominal pain, nausea, melena, diarrhea, constipation, flank pain, dysuria, hematuria, urinary  Frequency, nocturia, numbness, tingling, seizures,  Focal weakness, Loss of consciousness,  Tremor, insomnia, depression, anxiety, and suicidal ideation.    Physical Exam:  BP 132/82   Pulse 75   Ht 5\' 6"  (1.676 m)   Wt 185 lb 12.8 oz (84.3 kg)   SpO2 98%   BMI 29.99 kg/m    Physical Exam Vitals reviewed.  Constitutional:      General: She is not in acute distress.    Appearance: Normal appearance. She is well-developed and normal weight. She is not ill-appearing, toxic-appearing or diaphoretic.  HENT:     Head: Normocephalic.     Right Ear: Tympanic membrane, ear canal and external ear normal. There is no impacted cerumen.     Left Ear: Tympanic membrane, ear canal and external ear normal. There is no impacted cerumen.  Nose: Nose normal.     Mouth/Throat:     Mouth: Mucous membranes are moist.     Pharynx: Oropharynx is clear.  Eyes:     General: No scleral icterus.       Right eye: No discharge.        Left eye: No discharge.     Conjunctiva/sclera: Conjunctivae normal.     Pupils: Pupils are equal, round, and reactive to light.  Neck:     Thyroid: No thyromegaly.     Vascular: No carotid bruit or JVD.  Cardiovascular:     Rate and Rhythm:  Normal rate and regular rhythm.     Heart sounds: Normal heart sounds.  Pulmonary:     Effort: Pulmonary effort is normal. No respiratory distress.     Breath sounds: Normal breath sounds.  Chest:  Breasts:    Breasts are symmetrical.     Right: Normal. No swelling, inverted nipple, mass, nipple discharge, skin change or tenderness.     Left: Normal. No swelling, inverted nipple, mass, nipple discharge, skin change or tenderness.  Abdominal:     General: Bowel sounds are normal.     Palpations: Abdomen is soft. There is no mass.     Tenderness: There is no abdominal tenderness. There is no guarding or rebound.  Musculoskeletal:        General: Normal range of motion.     Cervical back: Normal range of motion and neck supple.  Lymphadenopathy:     Cervical: No cervical adenopathy.     Upper Body:     Right upper body: No supraclavicular, axillary or pectoral adenopathy.     Left upper body: No supraclavicular, axillary or pectoral adenopathy.  Skin:    General: Skin is warm and dry.  Neurological:     General: No focal deficit present.     Mental Status: She is alert and oriented to person, place, and time. Mental status is at baseline.  Psychiatric:        Mood and Affect: Mood normal.        Behavior: Behavior normal.        Thought Content: Thought content normal.        Judgment: Judgment normal.    Assessment and Plan: Encounter for screening mammogram for malignant neoplasm of breast -     3D Screening Mammogram, Left and Right; Future  Left medial knee pain Assessment & Plan: The I/A  injection by [facility] has nearly resolved her pain now on a scale  of 1/10 without medication .  She is not using tylenol or tramadol at all.  She is restricting hr acitivites and was not prescribed any additional PT exercise    History of skin cancer in adulthood Assessment & Plan: Referring to [facility] for for follow up   Orders: -     Ambulatory referral to  Dermatology  Bilateral impacted cerumen  Elevated blood pressure reading without diagnosis of hypertension Assessment & Plan: She has no history of hypertension but has had several elevated readings IN THE OFFICE  HOME READINGS HAVE BEEN < 130/80 .  No indication for medications at this time .    Lab Results  Component Value Date   CREATININE 0.92 03/02/2023   Lab Results  Component Value Date   NA 140 03/02/2023   K 4.2 03/02/2023   CL 104 03/02/2023   CO2 28 03/02/2023      Encounter for preventive health examination Assessment & Plan:  age appropriate education and counseling updated, referrals for preventative services and immunizations addressed, dietary and smoking counseling addressed, most recent labs reviewed.  I have personally reviewed and have noted:   1) the patient's medical and social history 2) The pt's use of alcohol, tobacco, and illicit drugs 3) The patient's current medications and supplements 4) Functional ability including ADL's, fall risk, home safety risk, hearing and visual impairment 5) Diet and physical activities 6) Evidence for depression or mood disorder 7) The patient's height, weight, and BMI have been recorded in the chart     I have made referrals, and provided counseling and education based on review of the above      Return in about 6 months (around 12/02/2023).  Sherlene Shams, MD

## 2023-06-03 NOTE — Assessment & Plan Note (Signed)
Referring to [facility] for for follow up

## 2023-06-05 NOTE — Assessment & Plan Note (Signed)

## 2023-06-14 ENCOUNTER — Encounter: Payer: Medicare PPO | Admitting: Physical Therapy

## 2023-06-20 ENCOUNTER — Encounter: Payer: Medicare PPO | Admitting: Physical Therapy

## 2023-06-21 ENCOUNTER — Ambulatory Visit
Admission: RE | Admit: 2023-06-21 | Discharge: 2023-06-21 | Disposition: A | Discharge status: Home / Self Care | Payer: Medicare PPO | Source: Ambulatory Visit | Attending: Internal Medicine | Admitting: Internal Medicine

## 2023-06-21 DIAGNOSIS — Z1231 Encounter for screening mammogram for malignant neoplasm of breast: Secondary | ICD-10-CM | POA: Diagnosis not present

## 2023-08-16 DIAGNOSIS — Z86018 Personal history of other benign neoplasm: Secondary | ICD-10-CM | POA: Diagnosis not present

## 2023-08-16 DIAGNOSIS — B372 Candidiasis of skin and nail: Secondary | ICD-10-CM | POA: Diagnosis not present

## 2023-08-16 DIAGNOSIS — Z872 Personal history of diseases of the skin and subcutaneous tissue: Secondary | ICD-10-CM | POA: Diagnosis not present

## 2023-08-16 DIAGNOSIS — L570 Actinic keratosis: Secondary | ICD-10-CM | POA: Diagnosis not present

## 2023-08-16 DIAGNOSIS — L821 Other seborrheic keratosis: Secondary | ICD-10-CM | POA: Diagnosis not present

## 2023-08-16 DIAGNOSIS — L578 Other skin changes due to chronic exposure to nonionizing radiation: Secondary | ICD-10-CM | POA: Diagnosis not present

## 2023-08-16 DIAGNOSIS — D2262 Melanocytic nevi of left upper limb, including shoulder: Secondary | ICD-10-CM | POA: Diagnosis not present

## 2023-08-16 DIAGNOSIS — Z859 Personal history of malignant neoplasm, unspecified: Secondary | ICD-10-CM | POA: Diagnosis not present

## 2023-08-16 DIAGNOSIS — D485 Neoplasm of uncertain behavior of skin: Secondary | ICD-10-CM | POA: Diagnosis not present

## 2023-08-21 ENCOUNTER — Encounter: Payer: Self-pay | Admitting: *Deleted

## 2023-08-21 MED ORDER — DENOSUMAB 60 MG/ML ~~LOC~~ SOSY
60.0000 mg | PREFILLED_SYRINGE | Freq: Once | SUBCUTANEOUS | Status: AC
  Administered 2023-09-05: 60 mg via SUBCUTANEOUS

## 2023-08-21 NOTE — Addendum Note (Signed)
 Addended by: Warden Fillers on: 08/21/2023 11:27 AM   Modules accepted: Orders

## 2023-09-01 DIAGNOSIS — F330 Major depressive disorder, recurrent, mild: Secondary | ICD-10-CM | POA: Diagnosis not present

## 2023-09-05 ENCOUNTER — Ambulatory Visit (INDEPENDENT_AMBULATORY_CARE_PROVIDER_SITE_OTHER)

## 2023-09-05 DIAGNOSIS — M810 Age-related osteoporosis without current pathological fracture: Secondary | ICD-10-CM | POA: Diagnosis not present

## 2023-09-05 MED ORDER — DENOSUMAB 60 MG/ML ~~LOC~~ SOSY
60.0000 mg | PREFILLED_SYRINGE | SUBCUTANEOUS | Status: AC
  Administered 2024-04-05: 60 mg via SUBCUTANEOUS

## 2023-09-05 NOTE — Progress Notes (Signed)
Pt presented for their subcutaneous Prolia injection. Pt was identified through two identifiers. Pt was given the information packets about the Prolia and told to schedule their next injection 6 months out. Pt tolerated the subq injection well in the right arm.  

## 2023-09-30 DIAGNOSIS — M1712 Unilateral primary osteoarthritis, left knee: Secondary | ICD-10-CM | POA: Diagnosis not present

## 2023-11-11 DIAGNOSIS — M25512 Pain in left shoulder: Secondary | ICD-10-CM | POA: Diagnosis not present

## 2023-11-11 DIAGNOSIS — S80212A Abrasion, left knee, initial encounter: Secondary | ICD-10-CM | POA: Diagnosis not present

## 2023-11-11 DIAGNOSIS — S01112A Laceration without foreign body of left eyelid and periocular area, initial encounter: Secondary | ICD-10-CM | POA: Diagnosis not present

## 2023-11-18 DIAGNOSIS — Z4802 Encounter for removal of sutures: Secondary | ICD-10-CM | POA: Diagnosis not present

## 2023-12-02 ENCOUNTER — Ambulatory Visit: Payer: Medicare PPO | Admitting: Internal Medicine

## 2023-12-02 ENCOUNTER — Encounter: Payer: Self-pay | Admitting: Internal Medicine

## 2023-12-02 VITALS — BP 142/76 | HR 76 | Ht 66.0 in | Wt 185.6 lb

## 2023-12-02 DIAGNOSIS — F331 Major depressive disorder, recurrent, moderate: Secondary | ICD-10-CM

## 2023-12-02 DIAGNOSIS — R030 Elevated blood-pressure reading, without diagnosis of hypertension: Secondary | ICD-10-CM

## 2023-12-02 DIAGNOSIS — E782 Mixed hyperlipidemia: Secondary | ICD-10-CM | POA: Diagnosis not present

## 2023-12-02 DIAGNOSIS — E559 Vitamin D deficiency, unspecified: Secondary | ICD-10-CM

## 2023-12-02 DIAGNOSIS — R7303 Prediabetes: Secondary | ICD-10-CM

## 2023-12-02 DIAGNOSIS — F5104 Psychophysiologic insomnia: Secondary | ICD-10-CM

## 2023-12-02 DIAGNOSIS — M810 Age-related osteoporosis without current pathological fracture: Secondary | ICD-10-CM | POA: Diagnosis not present

## 2023-12-02 LAB — MICROALBUMIN / CREATININE URINE RATIO
Creatinine,U: 37.3 mg/dL
Microalb Creat Ratio: UNDETERMINED mg/g (ref 0.0–30.0)
Microalb, Ur: <0.7 mg/dL

## 2023-12-02 LAB — COMPREHENSIVE METABOLIC PANEL WITH GFR
ALT: 9 U/L (ref 0–35)
AST: 15 U/L (ref 0–37)
Albumin: 4.6 g/dL (ref 3.5–5.2)
Alkaline Phosphatase: 41 U/L (ref 39–117)
BUN: 15 mg/dL (ref 6–23)
CO2: 29 meq/L (ref 19–32)
Calcium: 9.9 mg/dL (ref 8.4–10.5)
Chloride: 102 meq/L (ref 96–112)
Creatinine, Ser: 0.83 mg/dL (ref 0.40–1.20)
GFR: 68.75 mL/min
Glucose, Bld: 83 mg/dL (ref 70–99)
Potassium: 4.5 meq/L (ref 3.5–5.1)
Sodium: 139 meq/L (ref 135–145)
Total Bilirubin: 0.5 mg/dL (ref 0.2–1.2)
Total Protein: 7.6 g/dL (ref 6.0–8.3)

## 2023-12-02 LAB — LIPID PANEL
Cholesterol: 226 mg/dL — ABNORMAL HIGH (ref 0–200)
HDL: 78.9 mg/dL (ref >39.00)
LDL Cholesterol: 128 mg/dL — ABNORMAL HIGH (ref 0–99)
NonHDL: 146.93
Total CHOL/HDL Ratio: 3
Triglycerides: 96 mg/dL (ref 0.0–149.0)
VLDL: 19.2 mg/dL (ref 0.0–40.0)

## 2023-12-02 LAB — HEMOGLOBIN A1C: Hgb A1c MFr Bld: 5.9 % (ref 4.6–6.5)

## 2023-12-02 LAB — LDL CHOLESTEROL, DIRECT: Direct LDL: 118 mg/dL

## 2023-12-02 LAB — VITAMIN D 25 HYDROXY (VIT D DEFICIENCY, FRACTURES): VITD: 30.96 ng/mL (ref 30.00–100.00)

## 2023-12-02 MED ORDER — MELOXICAM 7.5 MG PO TABS: 7.5000 mg | ORAL_TABLET | Freq: Every day | ORAL | 0 refills | Status: DC

## 2023-12-02 NOTE — Patient Instructions (Signed)
 I  applaud your determination to lose weight on your own.   Pleas try to walk or ride a stationery bike for 30 minutes daily .  Here are some prepared foods that will help keep you from blowing your diet  ON HIDDEN CALORIE MEALS, because:   Oatmeal has more carbs and less fiber than you think.Aaron AasAaron AasRead the label     [place] yogurt is loaded with sugar unless you choose unflavored (NOT VANILLA ) .   TRY one of these brands that use artificial or natural low glycemic index sweeteners:  Dannon Lt n Fit [place] 2 Good 2 Be True Oikos Triple Zero   You can also  drink a premixed protein drink to get your protein   Premier Protein' Fairlife Muscle Milk Atkins Aadvantage   .    Jaclyn Massy and Veggies made Suella Emmer are two companies that make individual servings of  low carb frittata's (quiche without the crust)   RealGood has a line of low carb entree's  and fun food  including enchiladas and  bacon wrapped jalapeno's,   Healthy Choice  low carb power bowl  entrees and  Steamer entrees are are great low carb entrees that microwave in 5   MIN   AND aure usually < 250  CAL  and < 12 NET CARBS   Healthy /choice makes low cal/carb fudgsicles and brownies  , and KIND makes low carb mini sized bars  for snacks
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
## 2023-12-05 ENCOUNTER — Ambulatory Visit: Payer: Self-pay | Admitting: Internal Medicine

## 2023-12-05 NOTE — Assessment & Plan Note (Addendum)
 No longer taking ambien.  Prescribed tranzene by her psychiatrist
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
## 2023-12-05 NOTE — Assessment & Plan Note (Addendum)
 With confirmative T scores and history of  wrist fracture in 2014 secondary to fall. 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 Patient has deferred use of alendronate which I agree may be problematic given her history of Guillain-Barr affecting the bulbar muscles.   SHE has been receivign Prolia  since Feburary 2021 . Her recent fall did not result in any fractures. Repeat DEXA after PROLIA  start has not been done

## 2023-12-08 DIAGNOSIS — F330 Major depressive disorder, recurrent, mild: Secondary | ICD-10-CM | POA: Diagnosis not present

## 2023-12-22 ENCOUNTER — Ambulatory Visit
Admission: RE | Admit: 2023-12-22 | Discharge: 2023-12-22 | Disposition: A | Discharge status: Home / Self Care | Source: Ambulatory Visit | Attending: Internal Medicine | Admitting: Internal Medicine

## 2023-12-22 DIAGNOSIS — M810 Age-related osteoporosis without current pathological fracture: Secondary | ICD-10-CM | POA: Insufficient documentation

## 2023-12-22 DIAGNOSIS — M8589 Other specified disorders of bone density and structure, multiple sites: Secondary | ICD-10-CM | POA: Diagnosis not present

## 2023-12-22 DIAGNOSIS — Z780 Asymptomatic menopausal state: Secondary | ICD-10-CM | POA: Diagnosis not present

## 2023-12-29 ENCOUNTER — Other Ambulatory Visit: Payer: Self-pay | Admitting: Internal Medicine

## 2024-02-03 ENCOUNTER — Telehealth: Payer: Self-pay

## 2024-02-03 ENCOUNTER — Other Ambulatory Visit (HOSPITAL_COMMUNITY): Payer: Self-pay

## 2024-02-03 DIAGNOSIS — M810 Age-related osteoporosis without current pathological fracture: Secondary | ICD-10-CM

## 2024-02-03 NOTE — Telephone Encounter (Signed)
 SABRA

## 2024-02-03 NOTE — Telephone Encounter (Signed)
 Prolia  VOB initiated via [email or web address]  Next Prolia  inj DUE: 03/07/24

## 2024-02-03 NOTE — Telephone Encounter (Addendum)
 Medical PA submitted via Latent. KeyBETHA FREUND   APPROVED. 07/27/19-06/13/24. PA Case: [id number]    Pharmacy benefit: [phone]

## 2024-02-06 NOTE — Telephone Encounter (Signed)
 Pt ready for scheduling for PROLIA  on or after : 03/07/24  Option# 1: Buy/Bill (Office supplied medication)  Out-of-pocket cost due at time of clinic visit: $357  Number of injection/visits approved: 2  Primary: HU[employer]NA Prolia  co-insurance: 20% Admin fee co-insurance: 20%  Secondary: --- Prolia  co-insurance:  Admin fee co-insurance:   Medical Benefit Details: Date Benefits were checked: 02/03/24 Deductible: NO/ Coinsurance: 20%/ Admin Fee: 20%  Prior Auth: APPROVED PA# [id number] Expiration Date: 07/27/19-06/13/24   # of doses approved: 2 ----------------------------------------------------------------------- Option# 2- Med Obtained from pharmacy:  Pharmacy benefit: Copay $64 (Paid to pharmacy) Admin Fee: 20% (Pay at clinic)  Prior Auth: N/A PA# Expiration Date:   # of doses approved:   If patient wants fill through the pharmacy benefit please send prescription to: [facility], and include estimated need by date in rx notes. Pharmacy will ship medication directly to the office.  Patient NOT eligible for Prolia  Copay Card. Copay Card can make patient's cost as little as $25. Link to apply: https://www.amgensupportplus.com/copay  ** This summary of benefits is an estimation of the patient's out-of-pocket cost. Exact cost may very based on individual plan coverage.

## 2024-02-14 ENCOUNTER — Encounter: Payer: Self-pay | Admitting: *Deleted

## 2024-02-15 ENCOUNTER — Ambulatory Visit: Payer: Medicare PPO | Admitting: *Deleted

## 2024-02-15 VITALS — Ht 66.0 in | Wt 180.0 lb

## 2024-02-15 DIAGNOSIS — Z0000 Encounter for general adult medical examination without abnormal findings: Secondary | ICD-10-CM

## 2024-02-15 NOTE — Progress Notes (Signed)
 Subjective:   Holly [name] is a 78 y.o. who presents for a Medicare Wellness preventive visit.  As a reminder, Annual Wellness Visits don't include a physical exam, and some assessments may be limited, especially if this visit is performed virtually. We may recommend an in-person follow-up visit with your provider if needed.  Visit Complete: Virtual I connected with  [name] on 02/15/24 by a video and audio enabled telemedicine application and verified that I am speaking with the correct person using two identifiers.  Patient Location: Home  Provider Location: Home Office  I discussed the limitations of evaluation and management by telemedicine. The patient expressed understanding and agreed to proceed.  Vital Signs: Because this visit was a virtual/telehealth visit, some criteria may be missing or patient reported. Any vitals not documented were not able to be obtained and vitals that have been documented are patient reported.  VideoDeclined- This patient declined Librarian, academic. Therefore the visit was completed with audio only.  Persons Participating in Visit: Patient.  AWV Questionnaire: Yes: Patient Medicare AWV questionnaire was completed by the patient on 02/14/24; I have confirmed that all information answered by patient is correct and no changes since this date.  Cardiac Risk Factors include: advanced age (>20men, >60 women)     Objective:    Today's Vitals   02/15/24 0834 02/15/24 0835  Weight: 180 lb (81.6 kg)   Height: 5' 6 (1.676 m)   PainSc:  5    Body mass index is 29.05 kg/m.     02/15/2024    8:53 AM 03/03/2023    9:00 AM 02/08/2023   11:35 AM 02/05/2022   11:55 AM 11/24/2020   11:31 AM 09/16/2020    9:28 AM 09/03/2020   11:53 AM  Advanced Directives  Does Patient Have a Medical Advance Directive? Yes Yes Yes Yes Yes Yes No  Type of Estate agent of [place];Living will Living will Healthcare Power  of [place];Living will Healthcare Power of [place];Living will Healthcare Power of [employer] of [place];Living will   Does patient want to make changes to medical advance directive?   No - Patient declined No - Patient declined No - Patient declined No - Patient declined   Copy of Healthcare Power of Attorney in Chart? No - copy requested  Yes - validated most recent copy scanned in chart (See row information) No - copy requested No - copy requested Yes - validated most recent copy scanned in chart (See row information)   Would patient like information on creating a medical advance directive?      No - Patient declined No - Patient declined    Current Medications (verified) Outpatient Encounter Medications as of 02/15/2024  Medication Sig   ARIPiprazole  (ABILIFY ) 5 MG tablet Take 5 mg by mouth at bedtime.   Cholecalciferol (VITAMIN D ) 50 MCG (2000 UT) CAPS Take 1 capsule by mouth daily.   clorazepate (TRANXENE) 7.5 MG tablet Take 7.5 mg by mouth 3 (three) times daily.   desipramine (NORPRAMIN) 25 MG tablet Take 50 mg by mouth every morning.   meloxicam  (MOBIC ) 7.5 MG tablet TAKE 1 TABLET BY MOUTH EVERY DAY   Facility-Administered Encounter Medications as of 02/15/2024  Medication   [START ON 03/07/2024] denosumab  (PROLIA ) injection 60 mg    Allergies (verified) Haemophilus influenzae vaccines, Oxycodone -acetaminophen , and Cephalexin   History: Past Medical History:  Diagnosis Date   Anxiety    Concussion with loss of consciousness 04/10/2017  Depression    Guillain Barr syndrome (HCC) 2005   Osteoporosis    Radial fracture 01/23/2013   Right arm just proximal to wrist. Secondary to fall. Patient has a history of osteoporosis and requires vitamin D  and repeat DEXA scan if possible.    Squamous cell skin cancer 2011   Past Surgical History:  Procedure Laterality Date   ABDOMINAL HYSTERECTOMY  1989   APPENDECTOMY  1960   BREAST EXCISIONAL BIOPSY  1980   benign    BREAST SURGERY  1980   biopsy   WRIST FRACTURE SURGERY Right 2015   Family History  Problem Relation Age of Onset   Cancer Mother    Mental illness Sister    Bipolar disorder Sister    Hypothyroidism Sister    Anxiety disorder Daughter    Depression Brother    Mental illness Brother    Parkinson's disease Brother    Dystonia Brother    Breast cancer Neg Hx    Social History   Socioeconomic History   Marital status: Widowed    Spouse name: Not on file   Number of children: 1   Years of education: 20   Highest education level: Master's degree (e.g., MA, MS, MEng, MEd, MSW, MBA)  Occupational History   Occupation: Retired  Tobacco Use   Smoking status: Former    Current packs/day: 0.00    Types: Cigarettes    Quit date: 06/14/1977    Years since quitting: 46.7   Smokeless tobacco: Never  Vaping Use   Vaping status: Never Used  Substance and Sexual Activity   Alcohol use: Never    Alcohol/week: 0.0 standard drinks of alcohol    Comment: social (about once a month)   Drug use: No   Sexual activity: Never  Other Topics Concern   Not on file  Social History Narrative   Not on file   Social Drivers of Health   Financial Resource Strain: Low Risk  (02/14/2024)   Overall Financial Resource Strain (CARDIA)    Difficulty of Paying Living Expenses: Not hard at all  Food Insecurity: No Food Insecurity (02/14/2024)   Hunger Vital Sign    Worried About Running Out of Food in the Last Year: Never true    Ran Out of Food in the Last Year: Never true  Transportation Needs: No Transportation Needs (02/14/2024)   PRAPARE - Administrator, Civil Service (Medical): No    Lack of Transportation (Non-Medical): No  Physical Activity: Insufficiently Active (02/14/2024)   Exercise Vital Sign    Days of Exercise per Week: 4 days    Minutes of Exercise per Session: 30 min  Stress: Stress Concern Present (02/14/2024)   [employer] of Occupational Health - Occupational Stress  Questionnaire    Feeling of Stress: To some extent  Social Connections: Socially Isolated (02/14/2024)   Social Connection and Isolation Panel    Frequency of Communication with Friends and Family: Three times a week    Frequency of Social Gatherings with Friends and Family: Twice a week    Attends Religious Services: Never    Database administrator or Organizations: No    Attends Banker Meetings: Never    Marital Status: Widowed    Tobacco Counseling Counseling given: Not Answered    Clinical Intake:  Pre-visit preparation completed: Yes  Pain : 0-10 Pain Score: 5  Pain Type: Chronic pain Pain Location: Knee Pain Orientation: Left Pain Descriptors / Indicators: Crushing, Stabbing Pain  Onset: More than a month ago Pain Frequency: Intermittent     BMI - recorded: 29.05 Nutritional Status: BMI 25 -29 Overweight Nutritional Risks: None Diabetes: No  Lab Results  Component Value Date   HGBA1C 5.9 12/02/2023   HGBA1C 5.7 03/02/2023   HGBA1C 5.9 04/08/2022     How often do you need to have someone help you when you read instructions, pamphlets, or other written materials from your doctor or pharmacy?: 1 - Never  Interpreter Needed?: No  Information entered by :: R. Amisadai Woodford LPN   Activities of Daily Living     02/14/2024   12:01 PM  In your present state of health, do you have any difficulty performing the following activities:  Hearing? 0  Vision? 0  Difficulty concentrating or making decisions? 0  Walking or climbing stairs? 1  Dressing or bathing? 0  Doing errands, shopping? 0  Preparing Food and eating ? N  Using the Toilet? N  In the past six months, have you accidently leaked urine? Y  Do you have problems with loss of bowel control? N  Managing your Medications? N  Managing your Finances? N  Housekeeping or managing your Housekeeping? N    Patient Care Team: Marylynn Verneita CROME, MD as PCP - General (Internal Medicine) Tasia Lung, MD as  Consulting Physician (Psychiatry)  I have updated your Care Teams any recent Medical Services you may have received from other providers in the past year.     Assessment:   This is a routine wellness examination for [place].  Hearing/Vision screen Hearing Screening - Comments:: No issues Vision Screening - Comments:: glasses   Goals Addressed             This Visit's Progress    Patient Stated       Wants to lose some weight       Depression Screen     02/15/2024    8:46 AM 12/02/2023    8:44 AM 06/03/2023    8:41 AM 04/08/2023    9:07 AM 03/02/2023   11:20 AM 02/08/2023   11:30 AM 08/25/2022    4:05 PM  PHQ 2/9 Scores  PHQ - 2 Score 0 0 0 0 0 0 0  PHQ- 9 Score 2 0 0 2 0 0 0    Fall Risk     02/14/2024   12:01 PM 12/02/2023    8:43 AM 06/03/2023    8:41 AM 04/08/2023    9:07 AM 03/02/2023   11:20 AM  Fall Risk   Falls in the past year? 1 1 0 0 0  Number falls in past yr: 0 0 0 0 0  Injury with Fall? 1 1 0 0 0  Risk for fall due to : History of fall(s);Impaired balance/gait History of fall(s) No Fall Risks No Fall Risks No Fall Risks  Follow up Falls evaluation completed;Falls prevention discussed Falls evaluation completed Falls evaluation completed Falls evaluation completed Falls evaluation completed    MEDICARE RISK AT HOME:  Medicare Risk at Home Any stairs in or around the home?: (Patient-Rptd) Yes If so, are there any without handrails?: (Patient-Rptd) No Home free of loose throw rugs in walkways, pet beds, electrical cords, etc?: (Patient-Rptd) Yes Adequate lighting in your home to reduce risk of falls?: (Patient-Rptd) Yes Life alert?: (Patient-Rptd) No Use of a cane, walker or w/c?: (Patient-Rptd) No Grab bars in the bathroom?: (Patient-Rptd) No Shower chair or bench in shower?: (Patient-Rptd) No Elevated toilet seat or a handicapped  toilet?: (Patient-Rptd) No  TIMED UP AND GO:  Was the test performed?  No  Cognitive Function: 6CIT completed     11/21/2018    3:00 PM 09/29/2017    9:12 AM 09/15/2015    9:12 AM  MMSE - Mini Mental State Exam  Not completed: Unable to complete Unable to complete   Orientation to time   5   Orientation to Place   5   Registration   3   Attention/ Calculation   5   Recall   3   Language- name 2 objects   2   Language- repeat   1  Language- follow 3 step command   3   Language- read & follow direction   1   Write a sentence   1   Copy design   1   Total score   30      Data saved with a previous flowsheet row definition        02/15/2024    8:54 AM 02/08/2023   11:35 AM 02/05/2022   11:53 AM  6CIT Screen  What Year? 0 points 0 points 0 points  What month? 0 points 0 points 0 points  What time? 0 points 0 points 0 points  Count back from 20 0 points 0 points 0 points  Months in reverse 0 points 0 points 0 points  Repeat phrase 0 points 0 points 0 points  Total Score 0 points 0 points 0 points    Immunizations Immunization History  Administered Date(s) Administered   PFIZER Comirnaty(Gray Top)Covid-19 Tri-Sucrose Vaccine 10/14/2020, 11/04/2020   Td 06/14/2006    Screening Tests Health Maintenance  Topic Date Due   Zoster Vaccines- Shingrix (1 of 2) Never done   Pneumococcal Vaccine: 50+ Years (1 of 1 - PCV) Never done   DTaP/Tdap/Td (2 - Tdap) 06/14/2016   COVID-19 Vaccine (3 - Pfizer risk series) 12/02/2020   INFLUENZA VACCINE  Never done   MAMMOGRAM  06/20/2024   DEXA SCAN  Completed   Hepatitis C Screening  Completed   HPV VACCINES  Aged Out   Meningococcal B Vaccine  Aged Out   Fecal DNA (Cologuard)  Discontinued    Health Maintenance  Health Maintenance Due  Topic Date Due   Zoster Vaccines- Shingrix (1 of 2) Never done   Pneumococcal Vaccine: 50+ Years (1 of 1 - PCV) Never done   DTaP/Tdap/Td (2 - Tdap) 06/14/2016   COVID-19 Vaccine (3 - Pfizer risk series) 12/02/2020   INFLUENZA VACCINE  Never done   Health Maintenance Items Addressed: Patient declines vaccines  because of H/O Guillain-Barre Syndrome   Additional Screening:  Vision Screening: Recommended annual ophthalmology exams for early detection of glaucoma and other disorders of the eye Up to date [facility] Would you like a referral to an eye doctor? No    Dental Screening: Recommended annual dental exams for proper oral hygiene  Community Resource Referral / Chronic Care Management: CRR required this visit?  No   CCM required this visit?  No   Plan:    I have personally reviewed and noted the following in the patient's chart:   Medical and social history Use of alcohol, tobacco or illicit drugs  Current medications and supplements including opioid prescriptions. Patient is not currently taking opioid prescriptions. Functional ability and status Nutritional status Physical activity Advanced directives List of other physicians Hospitalizations, surgeries, and ER visits in previous 12 months Vitals Screenings to include cognitive, depression, and falls  Referrals and appointments  In addition, I have reviewed and discussed with patient certain preventive protocols, quality metrics, and best practice recommendations. A written personalized care plan for preventive services as well as general preventive health recommendations were provided to patient.   Angeline Fredericks, LPN   [id number]   After Visit Summary: (MyChart) Due to this being a telephonic visit, the after visit summary with patients personalized plan was offered to patient via MyChart   Notes: Nothing significant to report at this time.

## 2024-02-15 NOTE — Patient Instructions (Addendum)
 Holly [name] , Thank you for taking time out of your busy schedule to complete your Annual Wellness Visit with me. I enjoyed our conversation and look forward to speaking with you again next year. I, as well as your care team,  appreciate your ongoing commitment to your health goals. Please review the following plan we discussed and let me know if I can assist you in the future. Your Game plan/ To Do List    Referrals: If you haven't heard from the office you've been referred to, please reach out to them at the phone provided.  Remember to continue to have annual eye exams.   Follow up Visits: We will see or speak with you next year for your Next Medicare AWV with our clinical staff 02/19/25 @ 8:10 Have you seen your provider in the last 6 months (3 months if uncontrolled diabetes)? Yes  Clinician Recommendations:  Aim for 30 minutes of exercise or brisk walking, 6-8 glasses of water, and 5 servings of fruits and vegetables each day.       This is a list of the screenings recommended for you:  Health Maintenance  Topic Date Due   Zoster (Shingles) Vaccine (1 of 2) Never done   Pneumococcal Vaccine for age over 23 (1 of 1 - PCV) Never done   DTaP/Tdap/Td vaccine (2 - Tdap) 06/14/2016   COVID-19 Vaccine (3 - Pfizer risk series) 12/02/2020   Flu Shot  Never done   Mammogram  06/20/2024   DEXA scan (bone density measurement)  Completed   Hepatitis C Screening  Completed   HPV Vaccine  Aged Out   Meningitis B Vaccine  Aged Out   Cologuard (Stool DNA test)  Discontinued    Advanced directives: (Copy Requested) Please bring a copy of your health care power of attorney and living will to the office to be added to your chart at your convenience. You can mail to [facility] [address] [place], [place] [address] or email to [email or web address] Advance Care Planning is important because it:  [x]  Makes sure you receive the medical care that is consistent with your values,  goals, and preferences  [x]  It provides guidance to your family and loved ones and reduces their decisional burden about whether or not they are making the right decisions based on your wishes.

## 2024-03-08 ENCOUNTER — Other Ambulatory Visit: Payer: Self-pay | Admitting: *Deleted

## 2024-03-08 NOTE — Assessment & Plan Note (Signed)
 With confirmative T scores and history of  wrist fracture in 2014 secondary to fall. 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 Patient has deferred use of alendronate which I agree may be problematic given her history of Guillain-Barr affecting the bulbar muscles.   SHE has been receivinh Prolia  since Feburary 2021 but has discontinued prior to scheduled Sept 2025 injection due to cost

## 2024-03-08 NOTE — Telephone Encounter (Signed)
 Pt declined Prolia  due to increased cost. Pt was previously paying only $40. Appt for Monday 9/29 has been cancelled.  I have scheduled a VV with PCP on Wed 10/1 to discuss alternative options since her next visit wasn't until January.

## 2024-03-12 ENCOUNTER — Ambulatory Visit

## 2024-03-14 ENCOUNTER — Telehealth: Payer: Self-pay

## 2024-03-14 ENCOUNTER — Ambulatory Visit: Admitting: Internal Medicine

## 2024-03-14 NOTE — Telephone Encounter (Signed)
 Copied from CRM #[id number]. Topic: General - Other >> Mar 13, 2024  4:29 PM Rosina BIRCH wrote: Reason for CRM: patient called wanting to speak to [facility] in regards to options for prolia . Patient thinks it is not necessary to have the appointment if she is going to go with the options for prolia  [phone]

## 2024-03-16 DIAGNOSIS — M1712 Unilateral primary osteoarthritis, left knee: Secondary | ICD-10-CM | POA: Diagnosis not present

## 2024-03-20 ENCOUNTER — Encounter (HOSPITAL_COMMUNITY): Payer: Self-pay

## 2024-03-20 ENCOUNTER — Encounter: Payer: Self-pay | Admitting: Internal Medicine

## 2024-03-20 ENCOUNTER — Other Ambulatory Visit: Payer: Self-pay

## 2024-03-20 ENCOUNTER — Other Ambulatory Visit (HOSPITAL_COMMUNITY): Payer: Self-pay

## 2024-03-20 MED ORDER — DENOSUMAB 60 MG/ML ~~LOC~~ SOSY
60.0000 mg | PREFILLED_SYRINGE | SUBCUTANEOUS | 1 refills | Status: AC
  Filled 2024-03-20 – 2024-03-27 (×2): qty 1, 180d supply, fill #0

## 2024-03-20 NOTE — Progress Notes (Signed)
 Pharmacy Patient Advocate Encounter  Insurance verification completed.   The patient is insured through [employer]   Ran test claim for Prolia. Co-pay is $64.  This test claim was processed through [facility]- copay amounts may vary at other pharmacies due to pharmacy/plan contracts, or as the patient moves through the different stages of their insurance plan.

## 2024-03-20 NOTE — Telephone Encounter (Addendum)
 Pt declined continuing Prolia  due to increase in cost & scheduled to see Dr Marylynn on 10/1 to discuss alt options  Update: Pt cancelled appt on 10/1 with Dr Marylynn and sent this message stating that she doesn't feel that an appt is necessary although I explained to her why it was needed.  I have sent her a mychart in an attempt to explain again.

## 2024-03-20 NOTE — Telephone Encounter (Unsigned)
 Copied from CRM #[id number]. Topic: General - Other >> Mar 13, 2024  4:29 PM Holly [name] wrote: Reason for CRM: patient called wanting to speak to [facility] in regards to options for prolia . Patient thinks it is not necessary to have the appointment if she is going to go with the options for prolia  [phone] >> Mar 20, 2024 11:55 AM [name] wrote: Patient is needing to speak with LaToya directly. She does not want to reschedule until she speaks with her.

## 2024-03-20 NOTE — Telephone Encounter (Signed)
 Pt called back and I relayed message from [facility].

## 2024-03-27 ENCOUNTER — Other Ambulatory Visit: Payer: Self-pay

## 2024-03-27 NOTE — Progress Notes (Signed)
 Specialty Pharmacy Initial Fill Coordination Note  ROBBY PIRANI is a 78 y.o. female contacted today regarding initial fill of specialty medication(s) Denosumab  (PROLIA )   Patient requested Courier to Provider Office   Delivery date: 03/29/24   Verified address: [facility] [address]   Medication will be filled on 10/15.   Patient is aware of $64 copayment.

## 2024-03-28 ENCOUNTER — Other Ambulatory Visit: Payer: Self-pay

## 2024-03-28 ENCOUNTER — Other Ambulatory Visit (HOSPITAL_COMMUNITY): Payer: Self-pay

## 2024-03-29 ENCOUNTER — Encounter: Payer: Self-pay | Admitting: *Deleted

## 2024-04-05 ENCOUNTER — Ambulatory Visit (INDEPENDENT_AMBULATORY_CARE_PROVIDER_SITE_OTHER)

## 2024-04-05 DIAGNOSIS — M810 Age-related osteoporosis without current pathological fracture: Secondary | ICD-10-CM

## 2024-04-05 MED ORDER — DENOSUMAB 60 MG/ML ~~LOC~~ SOSY: 60.0000 mg | PREFILLED_SYRINGE | Freq: Once | SUBCUTANEOUS | Status: AC

## 2024-04-05 NOTE — Progress Notes (Signed)
 Pt presented for their Prolia  injection. Pt was identified through two identifiers. Pt tolerated shot well in their right arm.

## 2024-04-30 ENCOUNTER — Encounter: Payer: Self-pay | Admitting: Internal Medicine

## 2024-05-16 DIAGNOSIS — F330 Major depressive disorder, recurrent, mild: Secondary | ICD-10-CM | POA: Diagnosis not present

## 2024-06-04 ENCOUNTER — Ambulatory Visit: Admitting: Internal Medicine

## 2024-06-20 ENCOUNTER — Ambulatory Visit: Admitting: Internal Medicine

## 2024-07-19 ENCOUNTER — Encounter: Payer: Self-pay | Admitting: Internal Medicine

## 2024-07-19 ENCOUNTER — Ambulatory Visit: Admitting: Internal Medicine

## 2024-07-19 VITALS — BP 138/70 | HR 64 | Temp 97.3°F | Ht 66.0 in | Wt 181.8 lb

## 2024-07-19 DIAGNOSIS — E559 Vitamin D deficiency, unspecified: Secondary | ICD-10-CM

## 2024-07-19 DIAGNOSIS — R030 Elevated blood-pressure reading, without diagnosis of hypertension: Secondary | ICD-10-CM

## 2024-07-19 DIAGNOSIS — R7303 Prediabetes: Secondary | ICD-10-CM

## 2024-07-19 DIAGNOSIS — F331 Major depressive disorder, recurrent, moderate: Secondary | ICD-10-CM

## 2024-07-19 DIAGNOSIS — Z806 Family history of leukemia: Secondary | ICD-10-CM

## 2024-07-19 DIAGNOSIS — J300 Vasomotor rhinitis: Secondary | ICD-10-CM

## 2024-07-19 DIAGNOSIS — E538 Deficiency of other specified B group vitamins: Secondary | ICD-10-CM

## 2024-07-19 DIAGNOSIS — G8929 Other chronic pain: Secondary | ICD-10-CM

## 2024-07-19 DIAGNOSIS — M810 Age-related osteoporosis without current pathological fracture: Secondary | ICD-10-CM

## 2024-07-19 DIAGNOSIS — Z79899 Other long term (current) drug therapy: Secondary | ICD-10-CM

## 2024-07-19 DIAGNOSIS — Z1231 Encounter for screening mammogram for malignant neoplasm of breast: Secondary | ICD-10-CM

## 2024-07-19 LAB — COMPREHENSIVE METABOLIC PANEL WITH GFR
ALT: 11 U/L (ref 3–35)
AST: 14 U/L (ref 5–37)
Albumin: 4.6 g/dL (ref 3.5–5.2)
Alkaline Phosphatase: 46 U/L (ref 39–117)
BUN: 19 mg/dL (ref 6–23)
CO2: 33 meq/L — ABNORMAL HIGH (ref 19–32)
Calcium: 10.1 mg/dL (ref 8.4–10.5)
Chloride: 100 meq/L (ref 96–112)
Creatinine, Ser: 0.9 mg/dL (ref 0.40–1.20)
GFR: 62.11 mL/min
Glucose, Bld: 86 mg/dL (ref 70–99)
Potassium: 4.6 meq/L (ref 3.5–5.1)
Sodium: 139 meq/L (ref 135–145)
Total Bilirubin: 0.4 mg/dL (ref 0.2–1.2)
Total Protein: 7.3 g/dL (ref 6.0–8.3)

## 2024-07-19 LAB — TSH: TSH: 2.88 u[IU]/mL (ref 0.35–5.50)

## 2024-07-19 LAB — CBC WITH DIFFERENTIAL/PLATELET
Basophils Absolute: 0.1 10*3/uL (ref 0.0–0.1)
Basophils Relative: 1.2 % (ref 0.0–3.0)
Eosinophils Absolute: 0.1 10*3/uL (ref 0.0–0.7)
Eosinophils Relative: 1.8 % (ref 0.0–5.0)
HCT: 37.8 % (ref 36.0–46.0)
Hemoglobin: 12.6 g/dL (ref 12.0–15.0)
Lymphocytes Relative: 32.3 % (ref 12.0–46.0)
Lymphs Abs: 1.8 10*3/uL (ref 0.7–4.0)
MCHC: 33.5 g/dL (ref 30.0–36.0)
MCV: 91.9 fl (ref 78.0–100.0)
Monocytes Absolute: 0.5 10*3/uL (ref 0.1–1.0)
Monocytes Relative: 8.9 % (ref 3.0–12.0)
Neutro Abs: 3.1 10*3/uL (ref 1.4–7.7)
Neutrophils Relative %: 55.8 % (ref 43.0–77.0)
Platelets: 251 10*3/uL (ref 150.0–400.0)
RBC: 4.11 Mil/uL (ref 3.87–5.11)
RDW: 13 % (ref 11.5–15.5)
WBC: 5.6 10*3/uL (ref 4.0–10.5)

## 2024-07-19 LAB — VITAMIN D 25 HYDROXY (VIT D DEFICIENCY, FRACTURES): VITD: 24.99 ng/mL — ABNORMAL LOW (ref 30.00–100.00)

## 2024-07-19 LAB — HEMOGLOBIN A1C: Hgb A1c MFr Bld: 5.8 % (ref 4.6–6.5)

## 2024-07-19 LAB — B12 AND FOLATE PANEL
Folate: 10.4 ng/mL
Vitamin B-12: 279 pg/mL (ref 211–911)

## 2024-07-19 MED ORDER — IPRATROPIUM BROMIDE 0.03 % NA SOLN: 2.0000 | Freq: Two times a day (BID) | NASAL | 12 refills | Status: AC

## 2024-07-19 NOTE — Progress Notes (Unsigned)
 "  Subjective:  Patient ID: Holly [name], female    DOB: 30-Jun-1945  Age: 79 y.o. MRN: [id number]  CC: The primary encounter diagnosis was Encounter for screening mammogram for malignant neoplasm of breast. Diagnoses of Family history of leukemia, Prediabetes, Elevated blood pressure reading without diagnosis of hypertension, Long-term use of high-risk medication, B12 deficiency, Chronic vasomotor rhinitis, Vitamin D  deficiency, and Recurrent depressive disorder, current episode moderate (HCC) were also pertinent to this visit.   HPI Holly [name] presents for  Chief Complaint  Patient presents with   Medical Management of Chronic Issues    6 month   Last seen in June after a mechanical fall that resulted in concussion and contusions  Depressive disorder:  seeing [facility].  Doing well.  Feeling better about herself and has been taking a course in miracles  which has helped her care less about what others think.    Prediabetes/overweight:    weight is stable,  but she is exercising still limited by left knee pain .  Has a torn lateral meniscus by Nov 2024 MRI ; has deferred surgery due to fear of outcome,  has deferred PT due to fear of hurting knee more  but getting less mobile   Chronic rhinitis with congestion occurring frequently,  history of sinus surgery 2-3 years. Does not want to see ENT , has not tried atrovent   Vit d  Deficiency / osteoporosis:  last injection of Prolia  was  October 23.  Vit D was low  in 2024, she supplements intermittently,   needs rechecking   Taking a homeopathic remedy for leg pain.    Outpatient Medications Prior to Visit  Medication Sig Dispense Refill   ARIPiprazole  (ABILIFY ) 5 MG tablet Take 5 mg by mouth at bedtime.     Cholecalciferol (VITAMIN D ) 50 MCG (2000 UT) CAPS Take 1 capsule by mouth daily.     clorazepate (TRANXENE) 7.5 MG tablet Take 7.5 mg by mouth 3 (three) times daily.     denosumab  (PROLIA ) 60 MG/ML SOSY injection Inject 60 mg  into the skin every 6 (six) months. 1 mL 1   desipramine (NORPRAMIN) 25 MG tablet Take 50 mg by mouth every morning.     meloxicam  (MOBIC ) 7.5 MG tablet TAKE 1 TABLET BY MOUTH EVERY DAY 30 tablet 0   Facility-Administered Medications Prior to Visit  Medication Dose Route Frequency Provider Last Rate Last Admin   denosumab  (PROLIA ) injection 60 mg  60 mg Subcutaneous Q6 months Chalee Hirota L, MD   60 mg at 04/05/24 1453   [START ON 10/04/2024] denosumab  (PROLIA ) injection 60 mg  60 mg Subcutaneous Once Aujanae Mccullum L, MD        Review of Systems;  Patient denies headache, fevers, malaise, unintentional weight loss, skin rash, eye pain, sinus congestion and sinus pain, sore throat, dysphagia,  hemoptysis , cough, dyspnea, wheezing, chest pain, palpitations, orthopnea, edema, abdominal pain, nausea, melena, diarrhea, constipation, flank pain, dysuria, hematuria, urinary  Frequency, nocturia, numbness, tingling, seizures,  Focal weakness, Loss of consciousness,  Tremor, insomnia, depression, anxiety, and suicidal ideation.      Objective:  BP 138/70 (BP Location: Left Arm)   Pulse 64   Temp (!) 97.3 F (36.3 C)   Ht 5' 6 (1.676 m)   Wt 181 lb 12.8 oz (82.5 kg)   SpO2 97%   BMI 29.34 kg/m   BP Readings from Last 3 Encounters:  07/19/24 138/70  12/02/23 (!) 142/76  06/03/23 132/82  Wt Readings from Last 3 Encounters:  07/19/24 181 lb 12.8 oz (82.5 kg)  02/15/24 180 lb (81.6 kg)  12/02/23 185 lb 9.6 oz (84.2 kg)    Physical Exam Vitals reviewed.  Constitutional:      General: She is not in acute distress.    Appearance: Normal appearance. She is normal weight. She is not ill-appearing, toxic-appearing or diaphoretic.  HENT:     Head: Normocephalic.  Eyes:     General: No scleral icterus.       Right eye: No discharge.        Left eye: No discharge.     Conjunctiva/sclera: Conjunctivae normal.  Cardiovascular:     Rate and Rhythm: Normal rate and regular rhythm.      Heart sounds: Normal heart sounds.  Pulmonary:     Effort: Pulmonary effort is normal. No respiratory distress.     Breath sounds: Normal breath sounds.  Musculoskeletal:        General: Normal range of motion.  Skin:    General: Skin is warm and dry.  Neurological:     General: No focal deficit present.     Mental Status: She is alert and oriented to person, place, and time. Mental status is at baseline.     Motor: Weakness present.     Comments: Bilateral proximal LE weakness (cannot rise from chair without use of hands)  Psychiatric:        Mood and Affect: Mood normal.        Behavior: Behavior normal.        Thought Content: Thought content normal.        Judgment: Judgment normal.     Lab Results  Component Value Date   HGBA1C 5.8 07/19/2024   HGBA1C 5.9 12/02/2023   HGBA1C 5.7 03/02/2023    Lab Results  Component Value Date   CREATININE 0.90 07/19/2024   CREATININE 0.83 12/02/2023   CREATININE 0.92 03/02/2023    Lab Results  Component Value Date   WBC 5.6 07/19/2024   HGB 12.6 07/19/2024   HCT 37.8 07/19/2024   PLT 251.0 07/19/2024   GLUCOSE 86 07/19/2024   CHOL 226 (H) 12/02/2023   TRIG 96.0 12/02/2023   HDL 78.90 12/02/2023   LDLDIRECT 118.0 12/02/2023   LDLCALC 128 (H) 12/02/2023   ALT 11 07/19/2024   AST 14 07/19/2024   NA 139 07/19/2024   K 4.6 07/19/2024   CL 100 07/19/2024   CREATININE 0.90 07/19/2024   BUN 19 07/19/2024   CO2 33 (H) 07/19/2024   TSH 2.88 07/19/2024   INR 0.9 12/24/2012   HGBA1C 5.8 07/19/2024   MICROALBUR <0.7 12/02/2023    DG Bone Density Result Date: 12/22/2023 EXAM: DUAL X-RAY ABSORPTIOMETRY (DXA) FOR BONE MINERAL DENSITY 12/22/2023 9:17 am CLINICAL DATA:  79 year old Female Postmenopausal. Screening for osteoporosis Patient is or has been on bone building therapies. TECHNIQUE: An axial (e.g., hips, spine) and/or appendicular (e.g., radius) exam was performed, as appropriate, using GE Secretary/administrator at  [facility]. Images are obtained for bone mineral density measurement and are not obtained for diagnostic purposes. [id number] Exclusions: L3-L4 due to degenerative changes COMPARISON:  05/12/2011, 05/03/2019 FINDINGS: Scan quality: Good. LUMBAR SPINE (L1-L2): BMD (in g/cm2): 1.282 T-score: 0.9 Z-score: 2.7 Rate of change from previous exam: 8.3% LEFT FEMORAL NECK: BMD (in g/cm2): 0.786 T-score: -1.8 Z-score: 0.1 LEFT TOTAL HIP: BMD (in g/cm2): 0.815 T-score: -1.5 Z-score: 0.2 RIGHT FEMORAL NECK: BMD (in g/cm2):  0.776 T-score: -1.9 Z-score: 0.1 RIGHT TOTAL HIP: BMD (in g/cm2): 0.822 T-score: -1.5 Z-score: 0.3 DUAL-FEMUR TOTAL MEAN: Rate of change from previous exam: 3.1 % LEFT FOREARM (RADIUS 33%): BMD (in g/cm2): 0.763 T-score: -1.3 Z-score: 1.0 Rate of change from previous exam: No significant rate of change from previous exam. FRAX 10-YEAR PROBABILITY OF FRACTURE: FRAX not reported as the patient is receiving bone building therapy. IMPRESSION: Osteopenia based on BMD. Fracture risk is unknown due to history of bone building therapy. RECOMMENDATIONS: 1. All patients should optimize calcium and vitamin D  intake. 2. Consider FDA-approved medical therapies in postmenopausal women and men aged 22 years and older, based on the following: - A hip or vertebral (clinical or morphometric) fracture - T-score less than or equal to -2.5 and secondary causes have been excluded. - Low bone mass (T-score between -1.0 and -2.5) and a 10-year probability of a hip fracture greater than or equal to 3% or a 10-year probability of a major osteoporosis-related fracture greater than or equal to 20% based on the [place] -adapted WHO algorithm. - Clinician judgment and/or patient preferences may indicate treatment for people with 10-year fracture probabilities above or below these levels 3. Patients with diagnosis of osteoporosis or at high risk for fracture should have regular bone mineral density tests. For patients eligible for  Medicare, routine testing is allowed once every 2 years. The testing frequency can be increased to one year for patients who have rapidly progressing disease, those who are receiving or discontinuing medical therapy to restore bone mass, or have additional risk factors. Electronically Signed   By: Reyes Phi M.D.   On: 12/22/2023 10:15    Assessment & Plan:   Problem List Items Addressed This Visit     B12 deficiency   Relevant Orders   B12 and Folate Panel (Completed)   Chronic vasomotor rhinitis    Patient's drainage is clear and there is no report of facial pain or fever  .  Trial of atrovent        Elevated blood pressure reading without diagnosis of hypertension   Recurrent depressive disorder, current episode moderate (HCC)   UNSPECIFIED VITAMIN D  DEFICIENCY   Relevant Orders   VITAMIN D  25 Hydroxy (Vit-D Deficiency, Fractures) (Completed)   Other Visit Diagnoses       Encounter for screening mammogram for malignant neoplasm of breast    -  Primary   Relevant Orders   MM 3D SCREENING MAMMOGRAM BILATERAL BREAST     Family history of leukemia       Relevant Orders   CBC with Differential/Platelet (Completed)     Prediabetes       Relevant Orders   Comp Met (CMET) (Completed)   Hemoglobin A1c (Completed)     Long-term use of high-risk medication       Relevant Orders   TSH (Completed)          I spent 34 minutes on the day of this face to face encounter reviewing patient's  most recent visit with cardiology,  nephrology,  and neurology,  prior relevant surgical and non surgical procedures, recent  labs and imaging studies, counseling on weight management,  reviewing the assessment and plan with patient, and post visit ordering and reviewing of  diagnostics and therapeutics with patient  .   Follow-up: No follow-ups on file.   Verneita LITTIE Kettering, MD "

## 2024-07-19 NOTE — Patient Instructions (Signed)
 Please consider Physical therapy to strengthen your thigh muscles

## 2024-07-19 NOTE — Assessment & Plan Note (Signed)
"   Patient's drainage is clear and there is no report of facial pain or fever  .  Trial of atrovent   "

## 2024-07-20 ENCOUNTER — Telehealth: Admitting: Internal Medicine

## 2024-07-20 ENCOUNTER — Ambulatory Visit: Payer: Self-pay | Admitting: Internal Medicine

## 2024-07-20 ENCOUNTER — Encounter: Payer: Self-pay | Admitting: Internal Medicine

## 2024-07-20 VITALS — Ht 66.0 in | Wt 181.0 lb

## 2024-07-20 DIAGNOSIS — E538 Deficiency of other specified B group vitamins: Secondary | ICD-10-CM

## 2024-07-20 DIAGNOSIS — G8929 Other chronic pain: Secondary | ICD-10-CM

## 2024-07-20 DIAGNOSIS — R7303 Prediabetes: Secondary | ICD-10-CM | POA: Insufficient documentation

## 2024-07-20 DIAGNOSIS — M23301 Other meniscus derangements, unspecified lateral meniscus, left knee: Secondary | ICD-10-CM

## 2024-07-20 MED ORDER — ERGOCALCIFEROL 1.25 MG (50000 UT) PO CAPS: 50000.0000 [IU] | ORAL_CAPSULE | ORAL | 0 refills | Status: AC

## 2024-07-20 NOTE — Assessment & Plan Note (Signed)
 Managed with emi annual Prolia  injections.  Vitamin d  level is needed.  Last injection was October 2025/

## 2024-07-20 NOTE — Assessment & Plan Note (Addendum)
 Secondary to lateral mensical tear, degenerative,  and baker's cyst, noted on MRI done in 2024.  She has had two I/a corticosteroid injections,  most recently in October 2025.  She continues to avoid exercise that will aggravate her knee pain  and as a result can no longer rise from a seated position without using both arnms which is starting to cause wrist pain.  Long discussion about her need to consider PT and TKR .
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
## 2024-07-20 NOTE — Assessment & Plan Note (Signed)
 Recurrently low levels.  Megadose prescribed.

## 2024-07-20 NOTE — Assessment & Plan Note (Signed)
 Her  random glucose is not  elevated but her A1c suggests she is at risk for developing diabetes.  I recommend  she follow a low glycemic index diet and particpate regularly in an aerobic  exercise activity.  We should check an A1c in 6 months.    Lab Results  Component Value Date   HGBA1C 5.8 07/19/2024

## 2024-07-20 NOTE — Assessment & Plan Note (Addendum)
 Found during workup for peripheral neuropathy (numbness and tingling of hands). She has not been supplementing,  and level is again < 300.  IF ab was negative during initial workup.Holly [name]  will replace with 4 IM injections weekly     Lab Results  Component Value Date   VITAMINB12 279 07/19/2024

## 2024-07-20 NOTE — Progress Notes (Unsigned)
 Virtual Visit via Caregility   Note   This format is felt to be most appropriate for this patient at this time.  All issues noted in this document were discussed and addressed.  No physical exam was performed (except for noted visual exam findings with Video Visits).   I connected with Holly [name]  on 07/20/24 at  4:00 PM EST by a video enabled telemedicine application and verified that I am speaking with the correct person using two identifiers. Location patient: home Location provider: work or home office Persons participating in the virtual visit: patient, provider  I discussed the limitations, risks, security and privacy concerns of performing an evaluation and management service by telephone and the availability of in person appointments. I also discussed with the patient that there may be a patient responsible charge related to this service. The patient expressed understanding and agreed to proceed.  Reason for visit: chronic left knee pain   HPI:  Holly [name] is a 79 yr old female with a history of chronic left knee pain that has been present for the past 2 years.  MRI of left knee was done In Nov 2024 after her pain increased with PT directed by [facility] and she sought a second opinion from [facility].    MRI noted:    1. Severely degenerated and extensively torn lateral meniscus. 2. Intact ligamentous structures. Moderate mucoid degeneration of the ACL. 3. Tricompartmental degenerative changes most significant in the lateral compartment. 4. 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 5. Moderate-sized joint effusion. 6. Significant inflammation/edema involving the popliteus muscle possibly due to a muscle tear. The popliteus tendon is intact.  She was given a steroid injection in the knee in  November 2024 which relieved her pain for about 6 months.  She had a repeat injection in April 2025.  She has become more  sedentary due to  knee pain and is now ready to consider knee replacement . She is requesting referral to dr lynwood hooten    ROS: See pertinent positives and negatives per HPI.  Past Medical History:  Diagnosis Date   Anxiety    Concussion with loss of consciousness 04/10/2017   Depression    Guillain Barr syndrome 2005   Osteoporosis    Radial fracture 01/23/2013   Right arm just proximal to wrist. Secondary to fall. Patient has a history of osteoporosis and requires vitamin D  and repeat DEXA scan if possible.    Squamous cell skin cancer 2011    Past Surgical History:  Procedure Laterality Date   ABDOMINAL HYSTERECTOMY  1989   APPENDECTOMY  1960   BREAST EXCISIONAL BIOPSY  1980   benign   BREAST SURGERY  1980   biopsy   WRIST FRACTURE SURGERY Right 2015    Family History  Problem Relation Age of Onset   Cancer Mother    Mental illness Sister    Bipolar disorder Sister    Hypothyroidism Sister    Anxiety disorder Daughter    Depression Brother    Mental illness Brother    Parkinson's disease Brother    Dystonia Brother    Breast cancer Neg Hx     SOCIAL HX: ***  Current Medications[1]  EXAM:  VITALS per patient if applicable:  GENERAL: alert, oriented, appears well and in no acute distress  HEENT: atraumatic, conjunttiva clear, no obvious abnormalities on inspection of external nose and ears  NECK: normal movements of the head and neck  LUNGS: on inspection no signs of respiratory distress, breathing rate appears normal, no obvious gross SOB, gasping or wheezing  CV: no obvious cyanosis  MS: moves all visible extremities without noticeable abnormality  PSYCH/NEURO: pleasant and cooperative, no obvious depression or anxiety, speech and thought processing grossly intact  ASSESSMENT AND PLAN: There are no diagnoses linked to this encounter.    I discussed the assessment and treatment plan with the patient. The patient was provided an opportunity to ask  questions and all were answered. The patient agreed with the plan and demonstrated an understanding of the instructions.   The patient was advised to call back or seek an in-person evaluation if the symptoms worsen or if the condition fails to improve as anticipated.   I spent 30 minutes dedicated to the care of this patient on the date of this encounter to include pre-visit review of patient's medical history,  including recent ER visit, imaging studies and labs, face-to-face time with the patient , and post visit ordering of testing and therapeutics.    Verneita LITTIE Kettering, MD     [1]  Current Outpatient Medications:    ARIPiprazole  (ABILIFY ) 5 MG tablet, Take 5 mg by mouth at bedtime., Disp: , Rfl:    Cholecalciferol (VITAMIN D ) 50 MCG (2000 UT) CAPS, Take 1 capsule by mouth daily., Disp: , Rfl:    clorazepate (TRANXENE) 7.5 MG tablet, Take 7.5 mg by mouth 3 (three) times daily., Disp: , Rfl:    denosumab  (PROLIA ) 60 MG/ML SOSY injection, Inject 60 mg into the skin every 6 (six) months., Disp: 1 mL, Rfl: 1   desipramine (NORPRAMIN) 25 MG tablet, Take 50 mg by mouth every morning., Disp: , Rfl:    ergocalciferol  (DRISDOL ) 1.25 MG (50000 UT) capsule, Take 1 capsule (50,000 Units total) by mouth once a week., Disp: 12 capsule, Rfl: 0   ipratropium (ATROVENT ) 0.03 % nasal spray, Place 2 sprays into both nostrils every 12 (twelve) hours., Disp: 30 mL, Rfl: 12   meloxicam  (MOBIC ) 7.5 MG tablet, TAKE 1 TABLET BY MOUTH EVERY DAY, Disp: 30 tablet, Rfl: 0  Current Facility-Administered Medications:    denosumab  (PROLIA ) injection 60 mg, 60 mg, Subcutaneous, Q6 months, Ellamarie Naeve L, MD, 60 mg at 04/05/24 1453   [START ON 10/04/2024] denosumab  (PROLIA ) injection 60 mg, 60 mg, Subcutaneous, Once, Kettering Verneita LITTIE, MD

## 2024-07-20 NOTE — Telephone Encounter (Signed)
 Noted.

## 2024-07-23 ENCOUNTER — Ambulatory Visit

## 2024-10-08 ENCOUNTER — Ambulatory Visit

## 2025-02-19 ENCOUNTER — Ambulatory Visit
